# Patient Record
Sex: Male | Born: 1935
Health system: Southern US, Community
[De-identification: ages and names within clinical notes are randomized; demographics above are authoritative.]

## PROBLEM LIST (undated history)

## (undated) DIAGNOSIS — R001 Bradycardia, unspecified: Secondary | ICD-10-CM

## (undated) DIAGNOSIS — I499 Cardiac arrhythmia, unspecified: Secondary | ICD-10-CM

## (undated) DIAGNOSIS — I7789 Other specified disorders of arteries and arterioles: Secondary | ICD-10-CM

## (undated) DIAGNOSIS — I513 Intracardiac thrombosis, not elsewhere classified: Secondary | ICD-10-CM

## (undated) DIAGNOSIS — F419 Anxiety disorder, unspecified: Secondary | ICD-10-CM

## (undated) DIAGNOSIS — I4891 Unspecified atrial fibrillation: Secondary | ICD-10-CM

## (undated) DIAGNOSIS — I10 Essential (primary) hypertension: Secondary | ICD-10-CM

## (undated) DIAGNOSIS — I5020 Unspecified systolic (congestive) heart failure: Secondary | ICD-10-CM

## (undated) DIAGNOSIS — Z7901 Long term (current) use of anticoagulants: Secondary | ICD-10-CM

## (undated) DIAGNOSIS — K5790 Diverticulosis of intestine, part unspecified, without perforation or abscess without bleeding: Secondary | ICD-10-CM

## (undated) DIAGNOSIS — D696 Thrombocytopenia, unspecified: Secondary | ICD-10-CM

## (undated) DIAGNOSIS — C801 Malignant (primary) neoplasm, unspecified: Secondary | ICD-10-CM

## (undated) DIAGNOSIS — I5022 Chronic systolic (congestive) heart failure: Secondary | ICD-10-CM

## (undated) DIAGNOSIS — J189 Pneumonia, unspecified organism: Secondary | ICD-10-CM

## (undated) DIAGNOSIS — K649 Unspecified hemorrhoids: Secondary | ICD-10-CM

## (undated) DIAGNOSIS — I509 Heart failure, unspecified: Secondary | ICD-10-CM

## (undated) DIAGNOSIS — K219 Gastro-esophageal reflux disease without esophagitis: Secondary | ICD-10-CM

## (undated) DIAGNOSIS — M1990 Unspecified osteoarthritis, unspecified site: Secondary | ICD-10-CM

## (undated) DIAGNOSIS — J40 Bronchitis, not specified as acute or chronic: Secondary | ICD-10-CM

## (undated) DIAGNOSIS — I2699 Other pulmonary embolism without acute cor pulmonale: Secondary | ICD-10-CM

## (undated) HISTORY — DX: Other specified disorders of arteries and arterioles: I77.89

## (undated) HISTORY — DX: Heart failure, unspecified: I50.9

## (undated) HISTORY — PX: KNEE ARTHROSCOPY: SUR90

## (undated) HISTORY — DX: Intracardiac thrombosis, not elsewhere classified: I51.3

## (undated) HISTORY — DX: Essential (primary) hypertension: I10

## (undated) HISTORY — DX: Bradycardia, unspecified: R00.1

## (undated) HISTORY — DX: Long term (current) use of anticoagulants: Z79.01

## (undated) HISTORY — DX: Chronic systolic (congestive) heart failure: I50.22

## (undated) HISTORY — PX: PROSTATE SURGERY: SHX751

## (undated) HISTORY — DX: Thrombocytopenia, unspecified: D69.6

## (undated) HISTORY — DX: Pneumonia, unspecified organism: J18.9

## (undated) HISTORY — DX: Malignant (primary) neoplasm, unspecified: C80.1

## (undated) HISTORY — DX: Unspecified hemorrhoids: K64.9

## (undated) HISTORY — DX: Unspecified systolic (congestive) heart failure: I50.20

## (undated) HISTORY — DX: Diverticulosis of intestine, part unspecified, without perforation or abscess without bleeding: K57.90

---

## 2002-03-16 ENCOUNTER — Encounter: Payer: Self-pay | Admitting: General Surgery

## 2002-03-21 ENCOUNTER — Ambulatory Visit (HOSPITAL_COMMUNITY): Admission: RE | Admit: 2002-03-21 | Discharge: 2002-03-21 | Payer: Self-pay | Admitting: General Surgery

## 2004-09-26 ENCOUNTER — Ambulatory Visit: Payer: Self-pay | Admitting: Family Medicine

## 2004-11-25 ENCOUNTER — Ambulatory Visit: Payer: Self-pay | Admitting: Family Medicine

## 2004-12-02 ENCOUNTER — Ambulatory Visit: Payer: Self-pay | Admitting: Family Medicine

## 2004-12-20 ENCOUNTER — Ambulatory Visit: Payer: Self-pay | Admitting: Family Medicine

## 2005-01-29 ENCOUNTER — Ambulatory Visit: Payer: Self-pay | Admitting: Family Medicine

## 2005-02-12 ENCOUNTER — Ambulatory Visit: Payer: Self-pay | Admitting: Family Medicine

## 2005-03-06 ENCOUNTER — Ambulatory Visit: Payer: Self-pay | Admitting: Family Medicine

## 2005-04-24 ENCOUNTER — Emergency Department (HOSPITAL_COMMUNITY): Admission: EM | Admit: 2005-04-24 | Discharge: 2005-04-24 | Payer: Self-pay | Admitting: Emergency Medicine

## 2005-05-02 ENCOUNTER — Emergency Department (HOSPITAL_COMMUNITY): Admission: EM | Admit: 2005-05-02 | Discharge: 2005-05-02 | Payer: Self-pay | Admitting: Emergency Medicine

## 2005-05-07 ENCOUNTER — Ambulatory Visit: Payer: Self-pay | Admitting: Family Medicine

## 2005-05-30 ENCOUNTER — Ambulatory Visit (HOSPITAL_COMMUNITY): Admission: RE | Admit: 2005-05-30 | Discharge: 2005-05-30 | Payer: Self-pay | Admitting: Urology

## 2005-06-02 ENCOUNTER — Ambulatory Visit: Payer: Self-pay | Admitting: Family Medicine

## 2005-06-04 ENCOUNTER — Ambulatory Visit: Payer: Self-pay | Admitting: Cardiology

## 2005-06-18 ENCOUNTER — Ambulatory Visit: Payer: Self-pay | Admitting: Family Medicine

## 2005-07-07 ENCOUNTER — Ambulatory Visit: Payer: Self-pay | Admitting: Cardiology

## 2005-07-18 ENCOUNTER — Emergency Department (HOSPITAL_COMMUNITY): Admission: EM | Admit: 2005-07-18 | Discharge: 2005-07-18 | Payer: Self-pay | Admitting: Family Medicine

## 2005-08-21 ENCOUNTER — Ambulatory Visit: Payer: Self-pay | Admitting: Family Medicine

## 2005-09-30 ENCOUNTER — Ambulatory Visit: Payer: Self-pay | Admitting: Family Medicine

## 2005-12-09 ENCOUNTER — Ambulatory Visit: Payer: Self-pay | Admitting: Family Medicine

## 2006-01-22 ENCOUNTER — Ambulatory Visit: Payer: Self-pay | Admitting: Family Medicine

## 2006-05-21 ENCOUNTER — Ambulatory Visit: Payer: Self-pay | Admitting: Family Medicine

## 2006-07-03 ENCOUNTER — Ambulatory Visit: Payer: Self-pay | Admitting: Family Medicine

## 2006-09-22 ENCOUNTER — Ambulatory Visit: Payer: Self-pay | Admitting: Family Medicine

## 2008-11-29 ENCOUNTER — Ambulatory Visit: Payer: Self-pay | Admitting: Gastroenterology

## 2008-12-04 ENCOUNTER — Telehealth: Payer: Self-pay | Admitting: Gastroenterology

## 2008-12-07 ENCOUNTER — Ambulatory Visit: Payer: Self-pay | Admitting: Gastroenterology

## 2011-01-17 NOTE — Op Note (Signed)
[facility]  Patient:    Jimmy Myers, Jimmy Myers Visit Number: [id number] MRN: [id number]          Service Type: DSU Location: DAY Attending Physician:  Carson Myrtle Dictated by:   Sheppard Plumber Earlene Plater, M.D. Proc. Date: 03/21/02 Admit Date:  03/21/2002 Discharge Date: 03/21/2002                             Operative Report  PREOPERATIVE DIAGNOSIS:  Anal fissure.  POSTOPERATIVE DIAGNOSIS:  Anal fissure.  PROCEDURE:  Repair of anal fissure, proctoscopy.  SURGEON:  Timothy E. Earlene Plater, M.D.  ANESTHESIA:  General.  BRIEF CLINICAL NOTE:  The patient is 77 year old otherwise, healthy, stable. Denies any bowel problems and specifically denies constipation. He has had an intermittent fissure for years with only minimal relief with conservative management including diltiazem gel. After careful discussion, he wishes to proceed with this surgery.  The patient was evaluated by anesthesia, identified in the preop area, permit signed.  PROCEDURE IN DETAIL:  Taken to the operating room, placed supine. LMA anesthesia provided. He was placed in lithotomy. Perianal area inspected, prepped and draped in the usual fashion. A deep angry posterior anal fissure was present. Anal stenosis was moderate. The anus was prepped and draped in the usual fashion. Marcaine 0.25% with epinephrine was injected round about the anal orifice, massaged in well. A left posterior internal sphincterotomy accomplished percutaneously with a 15 blade. Care taken to avoid the external sphincter. The anus was gently dilated and accepted the operating anoscope. There was no other pathology noted. The fissure was debrided and cauterized. The scope was withdrawn. Proctoscopy accomplished to 22 cm, which was negative. He tolerated it well. Gelfoam gauze and dry sterile dressing applied. He was removed to the recovery room in good condition.  Written and verbal instructions including Percocet 5 mg,  #30 were given. He will be seen followed as an outpatient. Dictated by:   Sheppard Plumber Earlene Plater, M.D. Attending Physician:  Carson Myrtle DD:  03/21/02 TD:  03/24/02 Job: [id number]

## 2011-09-02 DIAGNOSIS — I509 Heart failure, unspecified: Secondary | ICD-10-CM

## 2011-09-02 DIAGNOSIS — J189 Pneumonia, unspecified organism: Secondary | ICD-10-CM

## 2011-09-02 HISTORY — DX: Pneumonia, unspecified organism: J18.9

## 2011-09-02 HISTORY — DX: Heart failure, unspecified: I50.9

## 2011-12-22 ENCOUNTER — Encounter: Payer: Self-pay | Admitting: Gastroenterology

## 2011-12-28 ENCOUNTER — Inpatient Hospital Stay (HOSPITAL_COMMUNITY)
Admission: EM | Admit: 2011-12-28 | Discharge: 2012-01-05 | DRG: 291 | Disposition: A | Discharge status: Home / Self Care | Payer: Medicare Other | Attending: Internal Medicine | Admitting: Internal Medicine

## 2011-12-28 ENCOUNTER — Encounter (HOSPITAL_COMMUNITY): Payer: Self-pay | Admitting: Neurology

## 2011-12-28 ENCOUNTER — Emergency Department (HOSPITAL_COMMUNITY): Payer: Medicare Other

## 2011-12-28 DIAGNOSIS — R1319 Other dysphagia: Secondary | ICD-10-CM

## 2011-12-28 DIAGNOSIS — J9600 Acute respiratory failure, unspecified whether with hypoxia or hypercapnia: Secondary | ICD-10-CM | POA: Diagnosis present

## 2011-12-28 DIAGNOSIS — I4891 Unspecified atrial fibrillation: Secondary | ICD-10-CM

## 2011-12-28 DIAGNOSIS — I5023 Acute on chronic systolic (congestive) heart failure: Principal | ICD-10-CM | POA: Diagnosis present

## 2011-12-28 DIAGNOSIS — I080 Rheumatic disorders of both mitral and aortic valves: Secondary | ICD-10-CM | POA: Diagnosis present

## 2011-12-28 DIAGNOSIS — I5022 Chronic systolic (congestive) heart failure: Secondary | ICD-10-CM | POA: Diagnosis present

## 2011-12-28 DIAGNOSIS — I10 Essential (primary) hypertension: Secondary | ICD-10-CM

## 2011-12-28 DIAGNOSIS — I519 Heart disease, unspecified: Secondary | ICD-10-CM

## 2011-12-28 DIAGNOSIS — E876 Hypokalemia: Secondary | ICD-10-CM | POA: Diagnosis present

## 2011-12-28 DIAGNOSIS — J189 Pneumonia, unspecified organism: Secondary | ICD-10-CM

## 2011-12-28 DIAGNOSIS — I428 Other cardiomyopathies: Secondary | ICD-10-CM | POA: Diagnosis present

## 2011-12-28 DIAGNOSIS — I509 Heart failure, unspecified: Secondary | ICD-10-CM

## 2011-12-28 DIAGNOSIS — J209 Acute bronchitis, unspecified: Secondary | ICD-10-CM

## 2011-12-28 DIAGNOSIS — D696 Thrombocytopenia, unspecified: Secondary | ICD-10-CM

## 2011-12-28 HISTORY — DX: Anxiety disorder, unspecified: F41.9

## 2011-12-28 HISTORY — DX: Chronic systolic (congestive) heart failure: I50.22

## 2011-12-28 HISTORY — DX: Unspecified atrial fibrillation: I48.91

## 2011-12-28 LAB — URINALYSIS, ROUTINE W REFLEX MICROSCOPIC
Ketones, ur: NEGATIVE mg/dL
Leukocytes, UA: NEGATIVE
Nitrite: NEGATIVE
Protein, ur: NEGATIVE mg/dL
pH: 5.5 (ref 5.0–8.0)

## 2011-12-28 LAB — BASIC METABOLIC PANEL
BUN: 20 mg/dL (ref 6–23)
Calcium: 8.7 mg/dL (ref 8.4–10.5)
Creatinine, Ser: 1.07 mg/dL (ref 0.50–1.35)
GFR calc Af Amer: 75 mL/min — ABNORMAL LOW (ref >90)
GFR calc non Af Amer: 65 mL/min — ABNORMAL LOW (ref >90)

## 2011-12-28 LAB — PHOSPHORUS: Phosphorus: 3.2 mg/dL (ref 2.3–4.6)

## 2011-12-28 LAB — CBC
MCH: 31.6 pg (ref 26.0–34.0)
MCHC: 32.8 g/dL (ref 30.0–36.0)
MCV: 96.2 fL (ref 78.0–100.0)
Platelets: 71 10*3/uL — ABNORMAL LOW (ref 150–400)
RDW: 15.8 % — ABNORMAL HIGH (ref 11.5–15.5)
WBC: 12.1 10*3/uL — ABNORMAL HIGH (ref 4.0–10.5)

## 2011-12-28 LAB — URINE MICROSCOPIC-ADD ON

## 2011-12-28 LAB — MAGNESIUM: Magnesium: 2 mg/dL (ref 1.5–2.5)

## 2011-12-28 LAB — PROTIME-INR: Prothrombin Time: 15.5 seconds — ABNORMAL HIGH (ref 11.6–15.2)

## 2011-12-28 LAB — EXPECTORATED SPUTUM ASSESSMENT W GRAM STAIN, RFLX TO RESP C

## 2011-12-28 LAB — CARDIAC PANEL(CRET KIN+CKTOT+MB+TROPI): Total CK: 66 U/L (ref 7–232)

## 2011-12-28 MED ORDER — WARFARIN - PHARMACIST DOSING INPATIENT
Freq: Every day | Status: DC
  Administered 2011-12-28 – 2012-01-04 (×3)

## 2011-12-28 MED ORDER — CLORAZEPATE DIPOTASSIUM 3.75 MG PO TABS
3.7500 mg | ORAL_TABLET | Freq: Two times a day (BID) | ORAL | Status: DC | PRN
  Administered 2012-01-03: 3.75 mg via ORAL
  Filled 2011-12-28: qty 1

## 2011-12-28 MED ORDER — ASPIRIN EC 325 MG PO TBEC
325.0000 mg | DELAYED_RELEASE_TABLET | Freq: Every day | ORAL | Status: DC
  Administered 2011-12-28: 325 mg via ORAL
  Filled 2011-12-28 (×2): qty 1

## 2011-12-28 MED ORDER — SODIUM CHLORIDE 0.9 % IV SOLN: INTRAVENOUS | Status: DC

## 2011-12-28 MED ORDER — DEXTROSE 5 % IV SOLN
1.0000 g | INTRAVENOUS | Status: AC
  Administered 2011-12-29 – 2012-01-03 (×6): 1 g via INTRAVENOUS
  Filled 2011-12-28 (×6): qty 10

## 2011-12-28 MED ORDER — FUROSEMIDE 10 MG/ML IJ SOLN
60.0000 mg | Freq: Once | INTRAMUSCULAR | Status: AC
  Administered 2011-12-28: 60 mg via INTRAVENOUS
  Filled 2011-12-28: qty 6

## 2011-12-28 MED ORDER — LEVALBUTEROL HCL 0.63 MG/3ML IN NEBU
0.6300 mg | INHALATION_SOLUTION | Freq: Four times a day (QID) | RESPIRATORY_TRACT | Status: DC | PRN
  Filled 2011-12-28: qty 3

## 2011-12-28 MED ORDER — GUAIFENESIN ER 600 MG PO TB12
1200.0000 mg | ORAL_TABLET | Freq: Two times a day (BID) | ORAL | Status: DC
  Administered 2011-12-28 – 2012-01-05 (×16): 1200 mg via ORAL
  Filled 2011-12-28 (×17): qty 2

## 2011-12-28 MED ORDER — NITROGLYCERIN IN D5W 200-5 MCG/ML-% IV SOLN
2.0000 ug/min | Freq: Once | INTRAVENOUS | Status: AC
  Administered 2011-12-28: 5 ug/min via INTRAVENOUS
  Filled 2011-12-28: qty 250

## 2011-12-28 MED ORDER — POTASSIUM CHLORIDE CRYS ER 20 MEQ PO TBCR
20.0000 meq | EXTENDED_RELEASE_TABLET | Freq: Two times a day (BID) | ORAL | Status: DC
  Administered 2011-12-28 – 2011-12-31 (×6): 20 meq via ORAL
  Filled 2011-12-28 (×7): qty 1

## 2011-12-28 MED ORDER — ONDANSETRON HCL 4 MG PO TABS: 4.0000 mg | ORAL_TABLET | Freq: Four times a day (QID) | ORAL | Status: DC | PRN

## 2011-12-28 MED ORDER — PANTOPRAZOLE SODIUM 40 MG PO TBEC
40.0000 mg | DELAYED_RELEASE_TABLET | Freq: Every day | ORAL | Status: DC
  Administered 2011-12-28 – 2012-01-05 (×9): 40 mg via ORAL
  Filled 2011-12-28 (×8): qty 1

## 2011-12-28 MED ORDER — ZOLPIDEM TARTRATE 5 MG PO TABS
5.0000 mg | ORAL_TABLET | Freq: Every evening | ORAL | Status: DC | PRN
  Administered 2011-12-29 – 2012-01-05 (×4): 5 mg via ORAL
  Filled 2011-12-28 (×4): qty 1

## 2011-12-28 MED ORDER — SODIUM CHLORIDE 0.9 % IJ SOLN
3.0000 mL | INTRAMUSCULAR | Status: DC | PRN
  Administered 2012-01-02: 3 mL via INTRAVENOUS

## 2011-12-28 MED ORDER — DOCUSATE SODIUM 100 MG PO CAPS
100.0000 mg | ORAL_CAPSULE | Freq: Two times a day (BID) | ORAL | Status: DC
  Administered 2011-12-28 – 2012-01-05 (×14): 100 mg via ORAL
  Filled 2011-12-28 (×17): qty 1

## 2011-12-28 MED ORDER — FUROSEMIDE 10 MG/ML IJ SOLN
80.0000 mg | Freq: Once | INTRAMUSCULAR | Status: AC
  Administered 2011-12-28: 80 mg via INTRAVENOUS
  Filled 2011-12-28: qty 8

## 2011-12-28 MED ORDER — NITROGLYCERIN 0.4 MG SL SUBL: 0.4000 mg | SUBLINGUAL_TABLET | SUBLINGUAL | Status: DC | PRN

## 2011-12-28 MED ORDER — FUROSEMIDE 10 MG/ML IJ SOLN
40.0000 mg | Freq: Two times a day (BID) | INTRAMUSCULAR | Status: DC
  Administered 2011-12-29: 40 mg via INTRAVENOUS
  Filled 2011-12-28 (×3): qty 4

## 2011-12-28 MED ORDER — MORPHINE SULFATE 2 MG/ML IJ SOLN: 1.0000 mg | INTRAMUSCULAR | Status: DC | PRN

## 2011-12-28 MED ORDER — AZITHROMYCIN 250 MG PO TABS
500.0000 mg | ORAL_TABLET | Freq: Once | ORAL | Status: AC
  Administered 2011-12-28: 500 mg via ORAL
  Filled 2011-12-28: qty 2

## 2011-12-28 MED ORDER — WARFARIN SODIUM 7.5 MG PO TABS
7.5000 mg | ORAL_TABLET | Freq: Once | ORAL | Status: DC
  Filled 2011-12-28: qty 1

## 2011-12-28 MED ORDER — DEXTROSE 5 % IV SOLN
500.0000 mg | INTRAVENOUS | Status: AC
  Administered 2011-12-29 – 2011-12-30 (×2): 500 mg via INTRAVENOUS
  Filled 2011-12-28 (×2): qty 500

## 2011-12-28 MED ORDER — SODIUM CHLORIDE 0.9 % IV SOLN
250.0000 mL | INTRAVENOUS | Status: DC | PRN
  Administered 2011-12-31: 23:00:00 via INTRAVENOUS

## 2011-12-28 MED ORDER — SODIUM CHLORIDE 0.9 % IJ SOLN
3.0000 mL | Freq: Two times a day (BID) | INTRAMUSCULAR | Status: DC
  Administered 2011-12-29 – 2012-01-05 (×12): 3 mL via INTRAVENOUS

## 2011-12-28 MED ORDER — DILTIAZEM HCL 100 MG IV SOLR
5.0000 mg/h | INTRAVENOUS | Status: DC
  Administered 2011-12-28: 5 mg/h via INTRAVENOUS
  Administered 2011-12-29 – 2012-01-01 (×10): 15 mg/h via INTRAVENOUS
  Filled 2011-12-28 (×3): qty 100

## 2011-12-28 MED ORDER — ONDANSETRON HCL 4 MG/2ML IJ SOLN: 4.0000 mg | Freq: Four times a day (QID) | INTRAMUSCULAR | Status: DC | PRN

## 2011-12-28 MED ORDER — SODIUM CHLORIDE 0.9 % IJ SOLN
3.0000 mL | Freq: Two times a day (BID) | INTRAMUSCULAR | Status: DC
  Administered 2011-12-28 (×2): 3 mL via INTRAVENOUS

## 2011-12-28 MED ORDER — WARFARIN SODIUM 2.5 MG PO TABS
2.5000 mg | ORAL_TABLET | Freq: Once | ORAL | Status: AC
  Administered 2011-12-28: 2.5 mg via ORAL
  Filled 2011-12-28: qty 1

## 2011-12-28 MED ORDER — HEPARIN (PORCINE) IN NACL 100-0.45 UNIT/ML-% IJ SOLN
1400.0000 [IU]/h | INTRAMUSCULAR | Status: DC
  Administered 2011-12-28: 1300 [IU]/h via INTRAVENOUS
  Administered 2011-12-29 – 2011-12-31 (×4): 1400 [IU]/h via INTRAVENOUS
  Filled 2011-12-28 (×8): qty 250

## 2011-12-28 MED ORDER — POTASSIUM CHLORIDE 20 MEQ/15ML (10%) PO LIQD
40.0000 meq | Freq: Once | ORAL | Status: AC
  Administered 2011-12-28: 40 meq via ORAL
  Filled 2011-12-28: qty 30

## 2011-12-28 MED ORDER — HEPARIN BOLUS VIA INFUSION
2000.0000 [IU] | Freq: Once | INTRAVENOUS | Status: AC
  Administered 2011-12-28: 2000 [IU] via INTRAVENOUS

## 2011-12-28 MED ORDER — ACETAMINOPHEN 325 MG PO TABS: 650.0000 mg | ORAL_TABLET | Freq: Four times a day (QID) | ORAL | Status: DC | PRN

## 2011-12-28 MED ORDER — ACETAMINOPHEN 650 MG RE SUPP: 650.0000 mg | Freq: Four times a day (QID) | RECTAL | Status: DC | PRN

## 2011-12-28 MED ORDER — DILTIAZEM HCL 25 MG/5ML IV SOLN
10.0000 mg | Freq: Once | INTRAVENOUS | Status: AC
  Administered 2011-12-28: 10 mg via INTRAVENOUS
  Filled 2011-12-28: qty 5

## 2011-12-28 MED ORDER — DEXTROSE 5 % IV SOLN
1.0000 g | Freq: Once | INTRAVENOUS | Status: AC
  Administered 2011-12-28: 1 g via INTRAVENOUS
  Filled 2011-12-28: qty 10

## 2011-12-28 NOTE — Progress Notes (Signed)
ANTICOAGULATION CONSULT NOTE - Initial Consult  Pharmacy Consult for Heparin Indication: atrial fibrillation  Allergies  Allergen Reactions  . Codeine Other (See Comments)    nervousness  . Simvastatin Other (See Comments)    unknown  . Vicodin (Hydrocodone-Acetaminophen) Other (See Comments)    nervousness    Patient Measurements: Height: 5\' 10"  (177.8 cm) Weight: 220 lb (99.791 kg) IBW/kg (Calculated) : 73  Heparin Dosing Weight: 94 kg  Vital Signs: Temp: 99.4 F (37.4 C) (04/28 1216) Temp src: Oral (04/28 1216) BP: 105/74 mmHg (04/28 1634) Pulse Rate: 117  (04/28 1634)  Labs:  Basename 12/28/11 1310  HGB 12.5*  HCT 38.1*  PLT 71*  APTT --  LABPROT 15.5*  INR 1.20  HEPARINUNFRC --  CREATININE 1.07  CKTOTAL --  CKMB --  TROPONINI --   Estimated Creatinine Clearance: 68.4 ml/min (by C-G formula based on Cr of 1.07).  Medical History: Past Medical History  Diagnosis Date  . CHF (congestive heart failure)   . A-fib   . Anxiety     Assessment: 73 YOM with history of CHF and Afib presented with SOB, with persistent afib with RVR. Pt. Is on coumadin PTA (takes 5mg  daily per Med Rec), INR (1.2) subtherapeutic on admission. Pharmacy is consulted to start heparin until INR is therapeutic. hgb 12.5, plt is low, 71K.    Goal of Therapy:  Heparin level 0.3-0.7 units/ml   Plan:  - heparin bolus 2000 units x 1 (smaller bolus d/t low plts) - heparin infusion 1300 units/hr - f/u heparin level 8 hours after infusion start - monitor daily cbc and heparin level and sx of bleeding - f/u resuming coumadin  Riki Rusk 12/28/2011,4:52 PM

## 2011-12-28 NOTE — Progress Notes (Addendum)
Pharmacy anticoagulation addendum.  Asked to add warfarin to heparin dosing for atrial fibrillation.  INR 1.2 today.  Per pt, diagnosed 2 days ago at [place] with afib and started taking warfarin 5 mg po qday at this time.  Goal INR 2-3  Plan - Give additional Warfarin 2.5 mg po x1 now  - Daily INR  Jimmy Myers L. Illene Bolus, PharmD, BCPS Clinical Pharmacist Pager: [phone] 12/28/2011 7:57 PM

## 2011-12-28 NOTE — ED Notes (Signed)
ECHO unable to complete due to high HR.

## 2011-12-28 NOTE — ED Notes (Signed)
EDP in to see patient.

## 2011-12-28 NOTE — Consult Note (Signed)
[facility]  Primary Cardiologist: None  CC:  New onset heart failure and atrial fibrillation  History of Present Illness: Jimmy Myers is a 78 y.o. male with a past medical history significant for anxiety who presents for evaluation of new onset heart failure and atrial fibrillation.  He reports worsening shortness of breath over the past several weeks.  This has been associated with bilateral lower extremity edema and orthopnea.  He denies PND or syncope.  He also notes that his heart rate has been elevated to the 130s for >2 weeks.  He was seen at the [place] 2 days ago and was diagnosed with AF.  He was started on cardizem and coumadin and also received lasix.  Tonight, he became more SOB and came to the ED.  In the ED his CXR demonstrated pulmonary edema and labs were notable for a BNP of >3000.  He was given IV lasix and started on IV dilt and heparin.  Cardiology is consulted for evaluation.    PMH: 1. Per HPI  SOCIAL HISTORY: The patient denies tobacco, alcohol or drug use.   FAMILY HISTORY: There is no family history of atrial fibrillation.    REVIEW OF SYSTEMS: All systems reviewed and are negative except as mentioned above in the HPI.    MEDICATIONS: No current facility-administered medications on file prior to encounter.   Current Outpatient Prescriptions on File Prior to Encounter  Medication Sig Dispense Refill  . diltiazem (CARDIZEM) 120 MG tablet Take 120 mg by mouth daily.      . furosemide (LASIX) 20 MG tablet Take 20 mg by mouth 2 (two) times daily.      . potassium chloride (K-DUR) 10 MEQ tablet Take 10 mEq by mouth daily.      Marland Kitchen warfarin (COUMADIN) 5 MG tablet Take 5 mg by mouth daily.         ALLERGY: Allergies  Allergen Reactions  . Codeine Other (See Comments)    nervousness  . Simvastatin Other (See Comments)    unknown  . Vicodin (Hydrocodone-Acetaminophen) Other (See Comments)    nervousness     PHYSICAL EXAMINATION: Filed Vitals:   12/28/11 1825  BP:  122/71  Pulse: 119  Temp:   Resp: 16     GENERAL: well developed, well nourished, no acute distress EYES: PERRL, EOMI ENT: Oropharynx is clear.  Dentition is within normal limits NECK:  There is 7cm JVD above the clavicle.  No masses HEART: Tachy, Irreg Irreg with no murmurs, rubs, or gallops LUNGS: Bibasilar crackles.   ABDOMEN:  +BS, soft, NTND EXTREMITIES:  1+ LEE B NEUROLOGIC: AOx3, no focal deficits SKIN: No rashes PSYCH:  Normal judgment and insight, mood is appropriate.  EKG: 12/28/11 - 1211 - AF with rate of 123 with nonspecific T wave changes.    Results for orders placed during the hospital encounter of 12/28/11 (from the past 24 hour(s))  CBC     Status: Abnormal   Collection Time   12/28/11  1:10 PM      Component Value Range   WBC 12.1 (*) 4.0 - 10.5 (K/uL)   RBC 3.96 (*) 4.22 - 5.81 (MIL/uL)   Hemoglobin 12.5 (*) 13.0 - 17.0 (g/dL)   HCT 16.1 (*) 09.6 - 52.0 (%)   MCV 96.2  78.0 - 100.0 (fL)   MCH 31.6  26.0 - 34.0 (pg)   MCHC 32.8  30.0 - 36.0 (g/dL)   RDW 04.5 (*) 40.9 - 15.5 (%)   Platelets  71 (*) 150 - 400 (K/uL)  BASIC METABOLIC PANEL     Status: Abnormal   Collection Time   12/28/11  1:10 PM      Component Value Range   Sodium 138  135 - 145 (mEq/L)   Potassium 4.3  3.5 - 5.1 (mEq/L)   Chloride 104  96 - 112 (mEq/L)   CO2 24  19 - 32 (mEq/L)   Glucose, Bld 120 (*) 70 - 99 (mg/dL)   BUN 20  6 - 23 (mg/dL)   Creatinine, Ser 1.19  0.50 - 1.35 (mg/dL)   Calcium 8.7  8.4 - 14.7 (mg/dL)   GFR calc non Af Amer 65 (*) >90 (mL/min)   GFR calc Af Amer 75 (*) >90 (mL/min)  PROTIME-INR     Status: Abnormal   Collection Time   12/28/11  1:10 PM      Component Value Range   Prothrombin Time 15.5 (*) 11.6 - 15.2 (seconds)   INR 1.20  0.00 - 1.49   PRO B NATRIURETIC PEPTIDE     Status: Abnormal   Collection Time   12/28/11  1:10 PM      Component Value Range   Pro B Natriuretic peptide (BNP) 3452.0 (*) 0 - 450 (pg/mL)  MAGNESIUM     Status: Normal    Collection Time   12/28/11  1:10 PM      Component Value Range   Magnesium 2.0  1.5 - 2.5 (mg/dL)  PHOSPHORUS     Status: Normal   Collection Time   12/28/11  1:10 PM      Component Value Range   Phosphorus 3.2  2.3 - 4.6 (mg/dL)  URINALYSIS, ROUTINE W REFLEX MICROSCOPIC     Status: Abnormal   Collection Time   12/28/11  4:58 PM      Component Value Range   Color, Urine YELLOW  YELLOW    APPearance CLEAR  CLEAR    Specific Gravity, Urine 1.012  1.005 - 1.030    pH 5.5  5.0 - 8.0    Glucose, UA NEGATIVE  NEGATIVE (mg/dL)   Hgb urine dipstick SMALL (*) NEGATIVE    Bilirubin Urine NEGATIVE  NEGATIVE    Ketones, ur NEGATIVE  NEGATIVE (mg/dL)   Protein, ur NEGATIVE  NEGATIVE (mg/dL)   Urobilinogen, UA 1.0  0.0 - 1.0 (mg/dL)   Nitrite NEGATIVE  NEGATIVE    Leukocytes, UA NEGATIVE  NEGATIVE   URINE MICROSCOPIC-ADD ON     Status: Normal   Collection Time   12/28/11  4:58 PM      Component Value Range   WBC, UA 0-2  <3 (WBC/hpf)   RBC / HPF 0-2  <3 (RBC/hpf)  CULTURE, EXPECTORATED SPUTUM-ASSESSMENT     Status: Normal   Collection Time   12/28/11  6:07 PM      Component Value Range   Specimen Description SPUTUM     Special Requests NONE     Sputum evaluation       Value: THIS SPECIMEN IS ACCEPTABLE. RESPIRATORY CULTURE REPORT TO FOLLOW.   Report Status 12/28/2011 FINAL     Dg Chest Portable 1 View  12/28/2011  *RADIOLOGY REPORT*  Clinical Data: Atrial fibrillation.  History of congestive heart failure.  PORTABLE CHEST - 1 VIEW  Comparison: 07/18/2005.  Findings: 1310 hours.  There is mild patient rotation to the right. The heart is enlarged.  There is vascular congestion with new perihilar and lower lobe air space opacities most consistent with edema.  There are probable small bilateral pleural effusions.  No pneumothorax is evident.  IMPRESSION: Bilateral air space opacities and probable pleural effusions, most consistent with congestive heart failure.  Original Report Authenticated By:  Gerrianne Scale, M.D.    ASSESSMENT AND PLAN: 13 YOWM with no prior cardiac history who presents with acute heart failure exacerbation in the setting of atrial fibrillation with RVR.   1: Acute heart failure exacerbation - the etiology of his HF is unclear and this could either be diastolic or systolic heart failure.  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  Other etiologies includes ischemia, myocarditis, etc.  Recommend continued IV diuresis with lasix 40mg  IV BID.  At this time, I would not start beta blocker since he is decompensated.  I would hold on ACEI as his BP is low.  Monitor daily weight and renal function.  Check TTE tomorrow.  He will need an ischemia evaluation prior to discharge (stress vs. Cath).  Cycle cardiac enzymes.    2: AF RVR - he is currently on dilt gtt at 15mg  / hr and his HR is in the low 100s.  His tachycardia is being exacerbated by his HF.  Continue IV dilt and IV heparin.  We can evaluate long term management after we have more information.    Bernestine Amass. Mayford Knife, MD Level 5 Consult

## 2011-12-28 NOTE — H&P (Signed)
PCP:   Josue Hector, MD, MD   Chief Complaint:  Shortness of breath for a week or so, productive cough for 2 days.  HPI: Mr Jimmy Myers comes in with worsening shortness of breath, building up over the last month but more severe in the last week or so. He has had cough productive of sputum(which is not clear per his account) in the last 2 days. He was seen at the [place] 2 days and was started on cardizem, coumadin for newly diagnosed afib. He also received lasix. He was supposed to follow at the [facility] tomorrow but could not wait till then, hence presentation in the ED today. He says he was getting increasingly short winded in the last month, and would stop to get relief. He denies gaining weight, but has noticed swelling of the legs, more on the left side. He denies orthopnea or PND. He has also been feeling unusually warm, particularly today. He denies chest pain. No diarrhea, vomiting or dysuria. IN ED his CXR suggested congestion, BNP was 4000, wbc 12000, temp 99.58F. He has been started on cardizem, lasix/ceftriaxone/zithromax/NTG, and referred for admission. He feels somewhat better.  Review of Systems:  unremar Past Medical History: Past Medical History  Diagnosis Date  . CHF (congestive heart failure)   . A-fib   . Anxiety    History reviewed. No pertinent past surgical history.  Medications: Prior to Admission medications   Medication Sig Start Date End Date Taking? Authorizing Provider  clorazepate (TRANXENE) 3.75 MG tablet Take 3.75 mg by mouth daily as needed. For anxiety   Yes Historical Provider, MD  diltiazem (CARDIZEM) 120 MG tablet Take 120 mg by mouth daily.   Yes Historical Provider, MD  furosemide (LASIX) 20 MG tablet Take 20 mg by mouth 2 (two) times daily.   Yes Historical Provider, MD  potassium chloride (K-DUR) 10 MEQ tablet Take 10 mEq by mouth daily.   Yes Historical Provider, MD  warfarin (COUMADIN) 5 MG tablet Take 5 mg by mouth daily.   Yes Historical Provider, MD     Allergies:   Allergies  Allergen Reactions  . Codeine Other (See Comments)    nervousness  . Simvastatin Other (See Comments)    unknown  . Vicodin (Hydrocodone-Acetaminophen) Other (See Comments)    nervousness    Social History:  reports that he has never smoked. He does not have any smokeless tobacco history on file. He reports that he does not drink alcohol or use illicit drugs. Lives with his wife.  Family History: chf in parents.  Physical Exam: Filed Vitals:   12/28/11 1515 12/28/11 1530 12/28/11 1615 12/28/11 1634  BP: 113/70 116/82 105/74 105/74  Pulse: 128 125 125 117  Temp:      TempSrc:      Resp: 27 24 28 14   Height:    5\' 10"  (1.778 m)  Weight:    99.791 kg (220 lb)  SpO2: 94% 94% 95% 94%   Reclined in bed, in mild respiratory distress, coughing occasionally, is sweaty. PERRLA. No JVD. No carotid bruits. Equal air entry both lungs. Scattered rales/rhonchi. No wheezing. S1S2 heard, No murmurs. Irregular tachycardia, ventricular arte around 130. Abdomen- soft, non tender. +BS. CNS- grossly Intact. Extremities- some pedal edema, more on the left side.   Labs on Admission:   [facility] 12/28/11 1310  NA 138  K 4.3  CL 104  CO2 24  GLUCOSE 120*  BUN 20  CREATININE 1.07  CALCIUM 8.7  MG --  PHOS --  No results found for this basename: AST:2,ALT:2,ALKPHOS:2,BILITOT:2,PROT:2,ALBUMIN:2 in the last 72 hours No results found for this basename: LIPASE:2,AMYLASE:2 in the last 72 hours  Basename 12/28/11 1310  WBC 12.1*  NEUTROABS --  HGB 12.5*  HCT 38.1*  MCV 96.2  PLT 71*   No results found for this basename: CKTOTAL:3,CKMB:3,CKMBINDEX:3,TROPONINI:3 in the last 72 hours No results found for this basename: TSH,T4TOTAL,FREET3,T3FREE,THYROIDAB in the last 72 hours No results found for this basename: VITAMINB12:2,FOLATE:2,FERRITIN:2,TIBC:2,IRON:2,RETICCTPCT:2 in the last 72 hours  Radiological Exams on Admission: Dg Chest Portable 1 View  12/28/2011   *RADIOLOGY REPORT*  Clinical Data: Atrial fibrillation.  History of congestive heart failure.  PORTABLE CHEST - 1 VIEW  Comparison: 07/18/2005.  Findings: 1310 hours.  There is mild patient rotation to the right. The heart is enlarged.  There is vascular congestion with new perihilar and lower lobe air space opacities most consistent with edema.  There are probable small bilateral pleural effusions.  No pneumothorax is evident.  IMPRESSION: Bilateral air space opacities and probable pleural effusions, most consistent with congestive heart failure.  Original Report Authenticated By: Gerrianne Scale, M.D.    Assessment 78 year old male with acute exacerbation of CHF(EF unknown), in setting of AFIB with rvr, newly diagnosed. He likely has tachycardia induced element of cardiomyopathy over the last month. No recent ischemia work up. He may have superimposed PNA.  Plan .CHF, acute- admit SDU. Serial Cardiac enzymes/tsh/lipids panel/2decho, daily weight/I/O. Lasix/asa/kcl, hopefully acei once ef known. Dr Mayford Knife will graciously see patient in cosult later today. ? Ischemia work up at some point. .Community acquired pneumonia- septic work up. Ceftriaxone/zithromax. .New onset a-fib with rvr- amdit sdu for cardizem drip. Heparin/coumadin- target inr 2-3. Marland KitchenHTN (hypertension), benign- will be on cardizem drip. Gi prophylaxis.  Condition critical, time spent for critical care 45 minutes. I discussed plan of care with his wife and son at the bed side.  [name] [phone] 12/28/2011, 5:03 PM

## 2011-12-28 NOTE — ED Provider Notes (Signed)
History     CSN: [id number]  Arrival date & time 12/28/11  1205   First MD Initiated Contact with Patient 12/28/11 1214      Chief Complaint  Patient presents with  . Atrial Fibrillation    (Consider location/radiation/quality/duration/timing/severity/associated sxs/prior treatment) Patient is a 78 y.o. male presenting with shortness of breath.  Shortness of Breath  The current episode started 5 to 7 days ago. The problem occurs continuously. The problem has been gradually worsening. The problem is moderate. The symptoms are relieved by nothing. Associated symptoms include cough, shortness of breath and wheezing. Pertinent negatives include no chest pain, no chest pressure, no fever, no sore throat and no stridor.   Symptoms associated with the patient stating that his had a rapid heart rate has been present for about a week. Also has had shortness of breath seen at the [place] twice this past week with new diagnosis of atrial fib started on diltiazem and Coumadin and also chest x-ray showing some mild edema started on Lasix. Patient was not admitted to the [place]. EMS was called out to the house for shortness of breath that brought him in on 3 L of oxygen question sats were in the upper 90s off oxygen patient's sats remained above 92% on room air. Patient states that he or his breathing feels a little bit better. Past Medical History  Diagnosis Date  . CHF (congestive heart failure)   . A-fib   . Anxiety     History reviewed. No pertinent past surgical history.  No family history on file.  History  Substance Use Topics  . Smoking status: Never Smoker   . Smokeless tobacco: Not on file  . Alcohol Use: No      Review of Systems  Constitutional: Negative for fever, chills and diaphoresis.  HENT: Positive for congestion. Negative for sore throat.   Eyes: Negative for visual disturbance.  Respiratory: Positive for cough, shortness of breath and wheezing. Negative for chest tightness  and stridor.   Cardiovascular: Positive for palpitations and leg swelling. Negative for chest pain.  Gastrointestinal: Negative for nausea, vomiting, abdominal pain and diarrhea.  Genitourinary: Positive for dysuria.  Musculoskeletal: Negative for back pain.  Skin: Negative for rash.  Neurological: Negative for light-headedness and headaches.    Allergies  Codeine; Simvastatin; and Vicodin  Home Medications   Current Outpatient Rx  Name Route Sig Dispense Refill  . CLORAZEPATE DIPOTASSIUM 3.75 MG PO TABS Oral Take 3.75 mg by mouth daily as needed. For anxiety    . DILTIAZEM HCL 120 MG PO TABS Oral Take 120 mg by mouth daily.    . FUROSEMIDE 20 MG PO TABS Oral Take 20 mg by mouth 2 (two) times daily.    Marland Kitchen POTASSIUM CHLORIDE ER 10 MEQ PO TBCR Oral Take 10 mEq by mouth daily.    . WARFARIN SODIUM 5 MG PO TABS Oral Take 5 mg by mouth daily.      BP 116/82  Pulse 125  Temp(Src) 99.4 F (37.4 C) (Oral)  Resp 24  SpO2 94%  Physical Exam  Nursing note and vitals reviewed. Constitutional: He is oriented to person, place, and time. He appears well-developed and well-nourished. No distress.  HENT:  Head: Normocephalic and atraumatic.  Mouth/Throat: Oropharynx is clear and moist.  Eyes: Conjunctivae and EOM are normal. Pupils are equal, round, and reactive to light.  Neck: Normal range of motion. Neck supple.  Cardiovascular: Normal rate, regular rhythm and normal heart sounds.   No  murmur heard. Pulmonary/Chest: Effort normal. No respiratory distress. He has wheezes. He has rales. He exhibits no tenderness.  Abdominal: Soft. Bowel sounds are normal. There is no tenderness.  Musculoskeletal: Normal range of motion. He exhibits no edema.  Neurological: He is alert and oriented to person, place, and time. No cranial nerve deficit. He exhibits normal muscle tone. Coordination normal.  Skin: Skin is warm. No rash noted. He is not diaphoretic.    ED Course  Procedures (including  critical care time)  Labs Reviewed  CBC - Abnormal; Notable for the following:    WBC 12.1 (*)    RBC 3.96 (*)    Hemoglobin 12.5 (*)    HCT 38.1 (*)    RDW 15.8 (*)    Platelets 71 (*)    All other components within normal limits  BASIC METABOLIC PANEL - Abnormal; Notable for the following:    Glucose, Bld 120 (*)    GFR calc non Af Amer 65 (*)    GFR calc Af Amer 75 (*)    All other components within normal limits  PROTIME-INR - Abnormal; Notable for the following:    Prothrombin Time 15.5 (*)    All other components within normal limits  PRO B NATRIURETIC PEPTIDE - Abnormal; Notable for the following:    Pro B Natriuretic peptide (BNP) 3452.0 (*)    All other components within normal limits   Dg Chest Portable 1 View  12/28/2011  *RADIOLOGY REPORT*  Clinical Data: Atrial fibrillation.  History of congestive heart failure.  PORTABLE CHEST - 1 VIEW  Comparison: 07/18/2005.  Findings: 1310 hours.  There is mild patient rotation to the right. The heart is enlarged.  There is vascular congestion with new perihilar and lower lobe air space opacities most consistent with edema.  There are probable small bilateral pleural effusions.  No pneumothorax is evident.  IMPRESSION: Bilateral air space opacities and probable pleural effusions, most consistent with congestive heart failure.  Original Report Authenticated By: Gerrianne Scale, M.D.     Date: 12/28/2011  Rate: 123  Rhythm: atrial fibrillation  QRS Axis: normal  Intervals: normal  ST/T Wave abnormalities: nonspecific T wave changes  Conduction Disutrbances:none  Narrative Interpretation:   Old EKG Reviewed: changes noted Today's EKG with occasional PVC in comparison to EKG from 07/18/2005 atrophic fibrillation is new.   1. CHF (congestive heart failure)   2. Atrial fibrillation with rapid ventricular response     CRITICAL CARE Performed by: Shelda Jakes.   Total critical care time: 30  Critical care time was  exclusive of separately billable procedures and treating other patients.  Critical care was necessary to treat or prevent imminent or life-threatening deterioration.  Critical care was time spent personally by me on the following activities: development of treatment plan with patient and/or surrogate as well as nursing, discussions with consultants, evaluation of patient's response to treatment, examination of patient, obtaining history from patient or surrogate, ordering and performing treatments and interventions, ordering and review of laboratory studies, ordering and review of radiographic studies, pulse oximetry and re-evaluation of patient's condition.   MDM  Patient's findings consistent with persistent atrial fibrillation with rapid ventricular rate below 150 though. And chest x-ray consistent with congestive heart failure probably due to the sustained atrial fibrillation. By history sounds as if this all started approximately one week ago. Patient is normally followed at the [place] he was seen by them twice this past week they determined that he was in atrial fibrillation  started him on diltiazem and then noted that he had a little bit of fluid on his lungs and he was started on Lasix. Her planning to follow him up of again this upcoming week. Patient's shortness of breath got worse predominantly since last evening associated with a cough. Patient has had a history of bronchospasm in the past but not recently. Patient called EMS for the shortness of breath they brought him here gave him 125 mg Solu-Medrol in the way in. His heart rate by EMS was around 136. Patient was started on Cardizem Lasix and Coumadin 2 days ago patient denied any chest pain. As stated above workup consistent with atrial fib with congestive heart patient was given 10 mg of IV diltiazem and bring in heart rate down into the low 100s without any problem with blood pressure. Chest x-ray showed CHF patient was given 80 mg of Lasix IV  as well as started on nitroglycerin drip to be titrated again blood pressure tolerated this fine.  [facility] contacted for admission as an unassigned patient patient will be admitted to telemetry to continue his diuresis and monitoring of his atrial fib. Clinically I was not concerned about a pneumonia even though the patient has had increased cough with is due more to the pulmonary edema however at their request I gave a dose of Rocephin 1 g IV and 500 mg Zithromax by mouth in case this is a community-acquired pneumonia patient has not had a recent hospital admission.        Shelda Jakes, MD 12/28/11 [phone]

## 2011-12-28 NOTE — ED Notes (Signed)
Per ems- EMS called out SOB and fast HR, pt dx Friday with CHF and AFIB, pt reported started taking Cardizem, Lasix, coumadin 2 days ago. Denying any cp. Skin warm and dry. HR 100-136 for EMS. EKG showing AFIB. A & O x 4. Pt given 2 atrovent for SOB, 125 solumedrol. IV 20 gauge L. Hand. Pt 96% 2 L, 119/71, RR 20.

## 2011-12-29 DIAGNOSIS — I4891 Unspecified atrial fibrillation: Secondary | ICD-10-CM | POA: Diagnosis present

## 2011-12-29 DIAGNOSIS — I059 Rheumatic mitral valve disease, unspecified: Secondary | ICD-10-CM

## 2011-12-29 DIAGNOSIS — I509 Heart failure, unspecified: Secondary | ICD-10-CM

## 2011-12-29 DIAGNOSIS — J209 Acute bronchitis, unspecified: Secondary | ICD-10-CM | POA: Diagnosis present

## 2011-12-29 LAB — PROTIME-INR: INR: 1.34 (ref 0.00–1.49)

## 2011-12-29 LAB — COMPREHENSIVE METABOLIC PANEL
ALT: 34 U/L (ref 0–53)
AST: 23 U/L (ref 0–37)
CO2: 26 mEq/L (ref 19–32)
Chloride: 103 mEq/L (ref 96–112)
GFR calc non Af Amer: 54 mL/min — ABNORMAL LOW (ref >90)
Potassium: 4.1 mEq/L (ref 3.5–5.1)
Sodium: 138 mEq/L (ref 135–145)
Total Bilirubin: 1 mg/dL (ref 0.3–1.2)

## 2011-12-29 LAB — LIPID PANEL
HDL: 40 mg/dL (ref >39)
LDL Cholesterol: 39 mg/dL (ref 0–99)

## 2011-12-29 LAB — CBC
Platelets: 48 10*3/uL — ABNORMAL LOW (ref 150–400)
RBC: 3.7 MIL/uL — ABNORMAL LOW (ref 4.22–5.81)
WBC: 11.5 10*3/uL — ABNORMAL HIGH (ref 4.0–10.5)

## 2011-12-29 LAB — URINE CULTURE

## 2011-12-29 LAB — TSH: TSH: 0.498 u[IU]/mL (ref 0.350–4.500)

## 2011-12-29 LAB — CARDIAC PANEL(CRET KIN+CKTOT+MB+TROPI)
Relative Index: INVALID (ref 0.0–2.5)
Troponin I: <0.3 ng/mL (ref <0.30)

## 2011-12-29 LAB — APTT: aPTT: 88 seconds — ABNORMAL HIGH (ref 24–37)

## 2011-12-29 MED ORDER — ASPIRIN EC 81 MG PO TBEC
81.0000 mg | DELAYED_RELEASE_TABLET | Freq: Every day | ORAL | Status: DC
  Administered 2011-12-29 – 2012-01-03 (×6): 81 mg via ORAL
  Filled 2011-12-29 (×6): qty 1

## 2011-12-29 MED ORDER — WARFARIN SODIUM 5 MG PO TABS
5.0000 mg | ORAL_TABLET | Freq: Once | ORAL | Status: AC
  Administered 2011-12-29: 5 mg via ORAL
  Filled 2011-12-29: qty 1

## 2011-12-29 MED ORDER — IPRATROPIUM BROMIDE 0.02 % IN SOLN
0.5000 mg | Freq: Four times a day (QID) | RESPIRATORY_TRACT | Status: DC
  Administered 2011-12-29 – 2012-01-04 (×25): 0.5 mg via RESPIRATORY_TRACT
  Filled 2011-12-29 (×24): qty 2.5

## 2011-12-29 MED ORDER — LEVALBUTEROL HCL 0.63 MG/3ML IN NEBU: 0.6300 mg | INHALATION_SOLUTION | RESPIRATORY_TRACT | Status: DC | PRN

## 2011-12-29 MED ORDER — IPRATROPIUM-ALBUTEROL 18-103 MCG/ACT IN AERO
2.0000 | INHALATION_SPRAY | Freq: Four times a day (QID) | RESPIRATORY_TRACT | Status: DC
  Filled 2011-12-29: qty 14.7

## 2011-12-29 MED ORDER — LEVALBUTEROL HCL 0.63 MG/3ML IN NEBU
0.6300 mg | INHALATION_SOLUTION | Freq: Four times a day (QID) | RESPIRATORY_TRACT | Status: DC
  Administered 2011-12-29 – 2012-01-04 (×25): 0.63 mg via RESPIRATORY_TRACT
  Filled 2011-12-29 (×29): qty 3

## 2011-12-29 MED ORDER — FUROSEMIDE 10 MG/ML IJ SOLN
80.0000 mg | Freq: Two times a day (BID) | INTRAMUSCULAR | Status: DC
  Administered 2011-12-29 – 2012-01-02 (×8): 80 mg via INTRAVENOUS
  Filled 2011-12-29 (×10): qty 8

## 2011-12-29 MED ORDER — PATIENT'S GUIDE TO USING COUMADIN BOOK
Freq: Once | Status: AC
  Administered 2011-12-29: 18:00:00
  Filled 2011-12-29: qty 1

## 2011-12-29 MED ORDER — WARFARIN VIDEO: Freq: Once | Status: DC

## 2011-12-29 NOTE — Progress Notes (Signed)
Utilization Review Completed.Emmalin Jaquess T4/29/2013   

## 2011-12-29 NOTE — Progress Notes (Addendum)
Pt admitted with new onset CHF in the setting of afib with RVR.  SUBJECTIVE: SOB is gradually improving.  He is very wheezy this am.     . albuterol-ipratropium  2 puff Inhalation Q6H  . aspirin EC  81 mg Oral Daily  . azithromycin  500 mg Intravenous Q24H  . azithromycin  500 mg Oral Once  . cefTRIAXone (ROCEPHIN)  IV  1 g Intravenous Once  . cefTRIAXone (ROCEPHIN)  IV  1 g Intravenous Q24H  . diltiazem  10 mg Intravenous Once  . docusate sodium  100 mg Oral BID  . furosemide  60 mg Intravenous Once  . furosemide  80 mg Intravenous Once  . furosemide  80 mg Intravenous Q12H  . guaiFENesin  1,200 mg Oral BID  . heparin  2,000 Units Intravenous Once  . nitroGLYCERIN  2-200 mcg/min Intravenous Once  . pantoprazole  40 mg Oral Q1200  . potassium chloride  40 mEq Oral Once  . potassium chloride  20 mEq Oral BID  . sodium chloride  3 mL Intravenous Q12H  . sodium chloride  3 mL Intravenous Q12H  . warfarin  2.5 mg Oral Once  . Warfarin - Pharmacist Dosing Inpatient   Does not apply q1800  . DISCONTD: sodium chloride   Intravenous STAT  . DISCONTD: aspirin EC  325 mg Oral Daily  . DISCONTD: furosemide  40 mg Intravenous Q12H  . DISCONTD: warfarin  7.5 mg Oral Once      . diltiazem (CARDIZEM) infusion 15 mg/hr (12/29/11 0700)  . heparin 1,400 Units/hr (12/29/11 0700)  . DISCONTD: sodium chloride      OBJECTIVE: Physical Exam: Filed Vitals:   12/29/11 0600 12/29/11 0630 12/29/11 0700 12/29/11 0730  BP: 96/59 93/65 97/61  96/60  Pulse: 93 104 93 90  Temp:      TempSrc:      Resp: 17     Height:      Weight:      SpO2: 90% 90% 90% 90%    Intake/Output Summary (Last 24 hours) at 12/29/11 0833 Last data filed at 12/29/11 0700  Gross per 24 hour  Intake    290 ml  Output   1225 ml  Net   -935 ml    Telemetry reveals afib, V rates 110s  GEN- The patient is elderly appearing, alert and oriented x 3 today.   Head- normocephalic, atraumatic Eyes-  Sclera clear,  conjunctiva pink Ears- hearing intact Oropharynx- clear Neck- supple, JVP 10cm Lungs- diffuse expiratory wheezes Heart- irregular rate and rhythm, no murmurs, rubs or gallops, PMI not laterally displaced GI- soft, NT, ND, + BS Extremities- no clubbing, cyanosis, or edema Skin- no rash or lesion Psych- euthymic mood, full affect Neuro- strength and sensation are intact  LABS: Basic Metabolic Panel:  Basename 12/29/11 0523 12/28/11 1310  NA 138 138  K 4.1 4.3  CL 103 104  CO2 26 24  GLUCOSE 151* 120*  BUN 30* 20  CREATININE 1.24 1.07  CALCIUM 8.6 8.7  MG -- 2.0  PHOS -- 3.2   Liver Function Tests:  Basename 12/29/11 0523  AST 23  ALT 34  ALKPHOS 58  BILITOT 1.0  PROT 6.2  ALBUMIN 2.7*   No results found for this basename: LIPASE:2,AMYLASE:2 in the last 72 hours CBC:  Basename 12/29/11 0523 12/28/11 1310  WBC 11.5* 12.1*  NEUTROABS -- --  HGB 11.7* 12.5*  HCT 35.3* 38.1*  MCV 95.4 96.2  PLT 48* 71*   Cardiac  Enzymes:  Basename 12/29/11 0523 12/28/11 2204  CKTOTAL 67 66  CKMB 3.8 3.7  CKMBINDEX -- --  TROPONINI <0.30 <0.30   Fasting Lipid Panel:  Basename 12/29/11 0523  CHOL 85  HDL 40  LDLCALC 39  TRIG 30  CHOLHDL 2.1  LDLDIRECT --    ASSESSMENT AND PLAN:  Active Problems:  CHF, acute  Community acquired pneumonia  New onset a-fib  HTN (hypertension), benign  1.  afib with RVR- continue cardizem drip Heparin gtt and coumadin I spoke with his PA from the [place] system who will check to see if he is a candidate for pradaxa, though I suspect that he will require coumadin from the [place] with lovenox bridge at DC Would anticipate TEE guided cardioversion once diuresed a bit more  2. Acute CHF- awaiting echo Continue diuresis and rate control Will likely improve with sinus rhythm  CMs are negative this far,  Consider further cardiac risk stratification pending echo results.   3. Acute respiratory failure- likely due to above,  Will add  bronchodilators for wheezing Follow closely.  Hillis Range, MD 12/29/2011 8:33 AM

## 2011-12-29 NOTE — Progress Notes (Signed)
ANTICOAGULATION CONSULT NOTE - Follow Up Consult  Pharmacy Consult for heparin Indication: atrial fibrillation  Labs:  [facility] 12/29/11 0108 12/28/11 2204 12/28/11 1310  HGB -- -- 12.5*  HCT -- -- 38.1*  PLT -- -- 71*  APTT -- -- --  LABPROT -- -- 15.5*  INR -- -- 1.20  HEPARINUNFRC 0.26* -- --  CREATININE -- -- 1.07  CKTOTAL -- 66 --  CKMB -- 3.7 --  TROPONINI -- <0.30 --    Assessment: 78yo male slightly subtherapeutic on heparin with initial dosing for Afib.  Goal of Therapy:  Heparin level 0.3-0.7 units/ml   Plan:  Will increase gtt by 1 unit/kg/hr to 1400 units/hr and check level with next CE.  Colleen Can PharmD BCPS 12/29/2011,2:34 AM

## 2011-12-29 NOTE — Progress Notes (Signed)
[facility] [place] TEAM 1 - Stepdown/ICU TEAM  Subjective: States no further awareness of tachypalpitations. Shortness of breath has also improved. Endorses a several week history of exercise intolerance where previously he can walk for 45 minutes without becoming winded - over the past 2 weeks this has worsened. Also, patient observed his heart rate had been consistently greater than 140.  Objective: Blood pressure 115/58, pulse 68, temperature 98.1 F (36.7 C), temperature source Oral, resp. rate 23, height 5\' 10"  (1.778 m), weight 96.6 kg (212 lb 15.4 oz), SpO2 95.00%.  Intake/Output from previous day: 04/28 0701 - 04/29 0700 In: 305 [I.V.:305] Out: 1225 [Urine:1225] Intake/Output this shift: Total I/O In: 230 [P.O.:120; I.V.:110] Out: 350 [Urine:350]  General appearance: alert, cooperative, appears stated age and no distress Resp: clear to auscultation bilaterally Cardio: atrial fibrillation, S1, S2 normal, no murmur, click, rub or gallo GI: soft, non-tender; bowel sounds normal; no masses,  no organomegaly Extremities: extremities normal, atraumatic, no cyanosis or edema Neurologic: Grossly normal  Lab Results:  Basename 12/29/11 0523 12/28/11 1310  WBC 11.5* 12.1*  HGB 11.7* 12.5*  HCT 35.3* 38.1*  PLT 48* 71*   BMET  Basename 12/29/11 0523 12/28/11 1310  NA 138 138  K 4.1 4.3  CL 103 104  CO2 26 24  GLUCOSE 151* 120*  BUN 30* 20  CREATININE 1.24 1.07  CALCIUM 8.6 8.7   Studies/Results: Dg Chest Portable 1 View  12/28/2011  *RADIOLOGY REPORT*  Clinical Data: Atrial fibrillation.  History of congestive heart failure.  PORTABLE CHEST - 1 VIEW  Comparison: 07/18/2005.  Findings: 1310 hours.  There is mild patient rotation to the right. The heart is enlarged.  There is vascular congestion with new perihilar and lower lobe air space opacities most consistent with edema.  There are probable small bilateral pleural effusions.  No pneumothorax is evident.   IMPRESSION: Bilateral air space opacities and probable pleural effusions, most consistent with congestive heart failure.  Original Report Authenticated By: Gerrianne Scale, M.D.   Medications: I have reviewed the patient's current medications.  Assessment/Plan:  Atrial fibrillation with rapid ventricular response/New onset a-fib *Rate currently much better controlled with IV Cardizem *Appreciate assistance of cardiology and electrophysiology  *Plans are for long-term chronic anticoagulation-patient prefers Pradaxa over Coumadin- currently pharmacy is managing heparin and Coumadin dosing *TEE guided cardioversion pending improved respiratory status after diuresis   CHF, acute *2-D echocardiogram pending *Continue Lasix 80 mg IV every 12 hours or as directed by cardiology *Likely onset after prolonged tachycardia i.e. tachycardia induced cardiomyopathy *Cardiac isoenzymes are negative so no indication for acute ischemic etiology  Community acquired pneumonia vs. Acute bronchitis *Had mild leukocytosis at presentation but based on history given to [place] by the patient it seems as if patient had tachycardia which then led to respiratory symptoms *No focal infiltrate seen on x-ray *Continue empiric antibiotic coverage for community-acquired pneumonia *Continue mucolytic and nebulizer therapy-Atrovent; Xopenex  HTN (hypertension), benign *Blood pressure well controlled  Disposition *Remain in stepdown   LOS: 1 day   Junious Silk, ANP pager [phone]  [facility]team 1 [email or web address] Password: [email or web address]  12/29/2011, 1:30 PM  I have personally examined this patient and reviewed the entire database. I have reviewed the above note, made any necessary editorial changes, and agree with its content.  Lonia Blood, MD [facility]

## 2011-12-29 NOTE — Progress Notes (Signed)
  Echocardiogram 2D Echocardiogram has been performed.  Wendelyn Kiesling, Real Cons 12/29/2011, 5:20 PM

## 2011-12-29 NOTE — Progress Notes (Addendum)
ANTICOAGULATION CONSULT NOTE - Follow Up Consult  Pharmacy Consult for heparin/warfarin Indication: atrial fibrillation  Labs:  Basename 12/29/11 1150 12/29/11 0523 12/29/11 0108 12/28/11 2204 12/28/11 1310  HGB -- 11.7* -- -- 12.5*  HCT -- 35.3* -- -- 38.1*  PLT -- 48* -- -- 71*  APTT -- 88* -- -- --  LABPROT 16.8* -- -- -- 15.5*  INR 1.34 -- -- -- 1.20  HEPARINUNFRC 0.39 -- 0.26* -- --  CREATININE -- 1.24 -- -- 1.07  CKTOTAL 73 67 -- 66 --  CKMB 4.4* 3.8 -- 3.7 --  TROPONINI <0.30 <0.30 -- <0.30 --    Assessment: 78yo male now at goal on heparin>>warfarin dosing for Afib. INR trending up after low dose last night but still below goal. No bleeding complications noted. Of note platelets were low on admit but have now fallen more 71>>48. Will continue at current rate and follow am cbc  Goal of Therapy:  Heparin level 0.3-0.7 units/ml  INR goal 2-3  Plan:  Continue heparin at 1400 units/hr Warfarin 5mg  tonight  Sheppard Coil PharmD BCPS 12/29/2011,1:13 PM

## 2011-12-30 ENCOUNTER — Encounter (HOSPITAL_COMMUNITY): Payer: Self-pay | Admitting: Anesthesiology

## 2011-12-30 ENCOUNTER — Encounter (HOSPITAL_COMMUNITY)
Admission: EM | Disposition: A | Discharge status: Home / Self Care | Payer: Self-pay | Source: Home / Self Care | Attending: Internal Medicine

## 2011-12-30 ENCOUNTER — Encounter (HOSPITAL_COMMUNITY): Payer: Self-pay | Admitting: *Deleted

## 2011-12-30 DIAGNOSIS — I519 Heart disease, unspecified: Secondary | ICD-10-CM | POA: Diagnosis present

## 2011-12-30 DIAGNOSIS — I4891 Unspecified atrial fibrillation: Secondary | ICD-10-CM

## 2011-12-30 DIAGNOSIS — D696 Thrombocytopenia, unspecified: Secondary | ICD-10-CM | POA: Diagnosis present

## 2011-12-30 DIAGNOSIS — R1319 Other dysphagia: Secondary | ICD-10-CM

## 2011-12-30 HISTORY — PX: CARDIOVERSION: SHX1299

## 2011-12-30 HISTORY — PX: ESOPHAGOGASTRODUODENOSCOPY: SHX5428

## 2011-12-30 HISTORY — PX: TEE WITHOUT CARDIOVERSION: SHX5443

## 2011-12-30 LAB — BASIC METABOLIC PANEL
BUN: 35 mg/dL — ABNORMAL HIGH (ref 6–23)
GFR calc Af Amer: 56 mL/min — ABNORMAL LOW (ref >90)
GFR calc non Af Amer: 48 mL/min — ABNORMAL LOW (ref >90)
Potassium: 3.9 mEq/L (ref 3.5–5.1)
Sodium: 139 mEq/L (ref 135–145)

## 2011-12-30 LAB — CULTURE, RESPIRATORY W GRAM STAIN

## 2011-12-30 LAB — CBC
Hemoglobin: 11.4 g/dL — ABNORMAL LOW (ref 13.0–17.0)
MCHC: 32.6 g/dL (ref 30.0–36.0)
RBC: 3.65 MIL/uL — ABNORMAL LOW (ref 4.22–5.81)

## 2011-12-30 LAB — PROTIME-INR: Prothrombin Time: 17.2 seconds — ABNORMAL HIGH (ref 11.6–15.2)

## 2011-12-30 LAB — HEPARIN LEVEL (UNFRACTIONATED): Heparin Unfractionated: 0.49 IU/mL (ref 0.30–0.70)

## 2011-12-30 SURGERY — EGD (ESOPHAGOGASTRODUODENOSCOPY)
Anesthesia: Moderate Sedation

## 2011-12-30 SURGERY — ECHOCARDIOGRAM, TRANSESOPHAGEAL
Anesthesia: Moderate Sedation | Site: Esophagus

## 2011-12-30 SURGERY — Surgical Case
Anesthesia: *Unknown

## 2011-12-30 SURGERY — CARDIOVERSION
Anesthesia: Monitor Anesthesia Care

## 2011-12-30 MED ORDER — BENZOCAINE 20 % MT SOLN: 1.0000 "application " | OROMUCOSAL | Status: DC | PRN

## 2011-12-30 MED ORDER — SODIUM CHLORIDE 0.9 % IJ SOLN: 3.0000 mL | INTRAMUSCULAR | Status: DC | PRN

## 2011-12-30 MED ORDER — SODIUM CHLORIDE 0.9 % IJ SOLN: 3.0000 mL | Freq: Two times a day (BID) | INTRAMUSCULAR | Status: DC

## 2011-12-30 MED ORDER — SODIUM CHLORIDE 0.9 % IV SOLN: 250.0000 mL | INTRAVENOUS | Status: DC | PRN

## 2011-12-30 MED ORDER — MIDAZOLAM HCL 10 MG/2ML IJ SOLN: 10.0000 mg | Freq: Once | INTRAMUSCULAR | Status: DC

## 2011-12-30 MED ORDER — WARFARIN SODIUM 7.5 MG PO TABS
7.5000 mg | ORAL_TABLET | Freq: Once | ORAL | Status: AC
  Administered 2011-12-30: 7.5 mg via ORAL
  Filled 2011-12-30: qty 1

## 2011-12-30 MED ORDER — SODIUM CHLORIDE 0.45 % IV SOLN: INTRAVENOUS | Status: DC

## 2011-12-30 MED ORDER — MIDAZOLAM HCL 10 MG/2ML IJ SOLN
INTRAMUSCULAR | Status: AC
  Filled 2011-12-30: qty 4

## 2011-12-30 MED ORDER — FENTANYL CITRATE 0.05 MG/ML IJ SOLN
INTRAMUSCULAR | Status: AC
  Filled 2011-12-30: qty 4

## 2011-12-30 MED ORDER — BUTAMBEN-TETRACAINE-BENZOCAINE 2-2-14 % EX AERO
INHALATION_SPRAY | CUTANEOUS | Status: DC | PRN
  Administered 2011-12-30: 2 via TOPICAL
  Administered 2011-12-30: 1 via TOPICAL

## 2011-12-30 MED ORDER — MIDAZOLAM HCL 10 MG/2ML IJ SOLN
INTRAMUSCULAR | Status: DC | PRN
  Administered 2011-12-30 (×3): 2 mg via INTRAVENOUS

## 2011-12-30 MED ORDER — FENTANYL CITRATE 0.05 MG/ML IJ SOLN
INTRAMUSCULAR | Status: DC | PRN
  Administered 2011-12-30 (×2): 25 ug via INTRAVENOUS

## 2011-12-30 MED ORDER — FENTANYL CITRATE 0.05 MG/ML IJ SOLN: 250.0000 ug | Freq: Once | INTRAMUSCULAR | Status: DC

## 2011-12-30 NOTE — Progress Notes (Signed)
ANTICOAGULATION CONSULT NOTE - Follow Up Consult  Pharmacy Consult for heparin/warfarin Indication: atrial fibrillation  Labs:  Basename 12/30/11 1050 12/30/11 0500 12/29/11 1150 12/29/11 0523 12/29/11 0108 12/28/11 2204 12/28/11 1310  HGB -- 11.4* -- 11.7* -- -- --  HCT -- 35.0* -- 35.3* -- -- 38.1*  PLT -- 55* -- 48* -- -- 71*  APTT -- -- -- 88* -- -- --  LABPROT -- 17.2* 16.8* -- -- -- 15.5*  INR -- 1.38 1.34 -- -- -- 1.20  HEPARINUNFRC 0.49 -- 0.39 -- 0.26* -- --  CREATININE -- 1.37* -- 1.24 -- -- 1.07  CKTOTAL -- -- 73 67 -- 66 --  CKMB -- -- 4.4* 3.8 -- 3.7 --  TROPONINI -- -- <0.30 <0.30 -- <0.30 --    Assessment: 78yo male now at goal on heparin>>warfarin dosing for Afib. Heparin at goal, INR trending up but still subtherapeutic. No bleeding complications noted. Platelets still low but up today 48>>55. Will continue at current rate and follow am platelets.  Scheduled for TEE/DCCV this afternoon.  Goal of Therapy:  Heparin level 0.3-0.7 units/ml  INR goal 2-3  Plan:  Continue heparin at 1400 units/hr Warfarin 7.5mg  tonight  Sheppard Coil PharmD BCPS 12/30/2011,11:54 AM

## 2011-12-30 NOTE — Progress Notes (Signed)
[facility] [place] TEAM 1 - Stepdown/ICU TEAM  Subjective: No complaints regarding chest pain or shortness of breath. Still having frequent productive cough with sputum being green and blood-tinged and color. Son is not at the bedside today.  Objective: Blood pressure 122/87, pulse 122, temperature 96.7 F (35.9 C), temperature source Oral, resp. rate 26, height 5\' 10"  (1.778 m), weight 96.1 kg (211 lb 13.8 oz), SpO2 97.00%.  Intake/Output from previous day: 04/29 0701 - 04/30 0700 In: 1638 [P.O.:760; I.V.:570; IV Piggyback:308] Out: 1952 [Urine:1950; Stool:2] Intake/Output this shift: Total I/O In: 195 [I.V.:195] Out: 1175 [Urine:1175]  General appearance: alert, cooperative, appears stated age and no distress Resp: More coarse today with midfield rhonchi, productive cough green and blood-tinged in nature, 3 L nasal cannula oxygen saturations 97% Cardio: atrial fibrillation ventricular response has been between 65 and 153, S1, S2 normal, no murmur, click, rub or gallop, remains on IV heparin and continuous Cardizem infusion GI: soft, non-tender; bowel sounds normal; no masses,  no organomegaly Extremities: extremities normal, atraumatic, no cyanosis or edema Neurologic: Grossly normal  Lab Results:  Basename 12/30/11 0500 12/29/11 0523  WBC 13.1* 11.5*  HGB 11.4* 11.7*  HCT 35.0* 35.3*  PLT 55* 48*   BMET  Basename 12/30/11 0500 12/29/11 0523  NA 139 138  K 3.9 4.1  CL 101 103  CO2 27 26  GLUCOSE 115* 151*  BUN 35* 30*  CREATININE 1.37* 1.24  CALCIUM 8.7 8.6   Studies/Results: No results found. Medications: I have reviewed the patient's current medications.  Assessment/Plan:  Atrial fibrillation with rapid ventricular response/New onset a-fib *Despite Cardizem infusion patient still has issues with episodic bursts of rapid ventricular response *Appreciate assistance of cardiology and electrophysiology  *Plan for TEE and if okay was to proceed with  DCCV today. 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 It is noted that the patient has had persistent tachycardia with rates between 110 and 140 for at least 2-3 weeks possibly longer and this is suggestive of reversible tachycardia induced cardiomyopathy *Continue Lasix 80 mg IV every 12 hours or as directed by cardiology *Likely onset after prolonged tachycardia i.e. tachycardia induced cardiomyopathy *Cardiac isoenzymes are negative so no indication for acute ischemic etiology *Suspect once patient rate and/or rhythm controlled and patient has had enough time for myocardial stunning to reverse will need followup 2-D echocardiogram. Ejection fraction remains depressed may require cardiac catheterization to rule out underlying ischemia despite current negative evaluation.  Community acquired pneumonia vs. Acute bronchitis *Now having abnormal lung sounds and thick discolored sputum *No focal infiltrate seen on x-ray *Continue empiric antibiotic coverage for community-acquired pneumonia *Continue mucolytic and nebulizer therapy-Atrovent; Xopenex  HTN (hypertension), benign *Blood pressure well controlled  Disposition *Remain in stepdown   LOS: 2 days   Junious Silk, ANP pager [phone]  [facility] [email or web address] Password: [email or web address]  12/30/2011, 2:31 PM    I have examined the pateint and reviewed the chart. TEE reveals LAA thrombus. Coumadin has been started. I agree with the above note.   Calvert Cantor,  MD [phone]

## 2011-12-30 NOTE — Interval H&P Note (Signed)
History and Physical Interval Note:  12/30/2011 1:50 PM  Jimmy Myers  has presented today for surgery, with the diagnosis of a fib  The various methods of treatment have been discussed with the patient and family. After consideration of risks, benefits and other options for treatment, the patient has consented to  Procedure(s) (LRB): TRANSESOPHAGEAL ECHOCARDIOGRAM (TEE) (N/A) CARDIOVERSION (N/A) as a surgical intervention .  The patients' history has been reviewed, patient examined, no change in status, stable for surgery.  I have reviewed the patients' chart and labs.  Questions were answered to the patient's satisfaction.     Olga Millers

## 2011-12-30 NOTE — Op Note (Signed)
[facility] [address] [place], [place]  [address]  ENDOSCOPY PROCEDURE REPORT  PATIENT:  Jimmy Myers, Jimmy Myers  MR#:  [id number] BIRTHDATE:  13-Mar-1932, 79 yrs. old  GENDER:  male  ENDOSCOPIST:  Hedwig Morton. Juanda Chance, MD Referred by:  Madolyn Frieze. Jens Som, M.D.  PROCEDURE DATE:  12/30/2011 PROCEDURE:  EGD, diagnostic 43235 ASA CLASS:  Class III INDICATIONS:  unable to pass TEE scope, suspected obstruction  MEDICATIONS:   These medications were titrated to patient response per physician's verbal order, Versed 2 mg, Fentanyl 25 mcg TOPICAL ANESTHETIC:  Cetacaine Spray  DESCRIPTION OF PROCEDURE:   After the risks benefits and alternatives of the procedure were thoroughly explained, informed consent was obtained.  The Pentax Gastroscope [id number] endoscope was introduced through the mouth and advanced to the second portion of the duodenum, without limitations.  The instrument was slowly withdrawn as the mucosa was fully examined. <<PROCEDUREIMAGES>>  The upper, middle, and distal third of the esophagus were carefully inspected and no abnormalities were noted. The z-line was well seen at the GEJ. The endoscope was pushed into the fundus which was normal including a retroflexed view. The antrum,gastric body, first and second part of the duodenum were unremarkable (see image001, image002, image003, image004, image005, and image006). large amount of keratinized secretions in the esophagus, no stricture,    Retroflexed views revealed no abnormalities.    The scope was then withdrawn from the patient and the procedure completed.  COMPLICATIONS:  None  ENDOSCOPIC IMPRESSION: 1) Normal EGD retained white secretions likely cause of difficulty in passing the TEE scope beyong UES, RECOMMENDATIONS: we have cleared most of the secretions by irrigation of the esophageal lumen  REPEAT EXAM:  In 0 year(s) for.  ______________________________ Hedwig Morton. Juanda Chance, MD  CC:  n. eSIGNED:    Hedwig Morton. Kadeen Sroka at 12/30/2011 03:28 PM  Marlyce Huge, [phone]

## 2011-12-30 NOTE — Preoperative (Signed)
Beta Blockers   Reason not to administer Beta Blockers:Not Applicable 

## 2011-12-30 NOTE — H&P (View-Only) (Signed)
Patient Name: Jimmy Myers Date of Encounter: 12/30/2011  Principal Problem:  *Atrial fibrillation with rapid ventricular response Active Problems:  CHF, acute  Community acquired pneumonia  New onset a-fib  HTN (hypertension), benign  Acute bronchitis    SUBJECTIVE: Pt has had no chest pain. SOB some better. Still coughing at times.   OBJECTIVE Filed Vitals:   12/30/11 0531 12/30/11 0600 12/30/11 0700 12/30/11 0726  BP:  120/80 116/74   Pulse:  101 97   Temp:      TempSrc:    Oral  Resp:  25 17   Height:      Weight: 211 lb 13.8 oz (96.1 kg)     SpO2:  93% 92%     Intake/Output Summary (Last 24 hours) at 12/30/11 0733 Last data filed at 12/30/11 0718  Gross per 24 hour  Intake   1638 ml  Output   2202 ml  Net   -564 ml   Weight change: -8 lb 2.2 oz (-3.691 kg) Filed Weights   12/28/11 1825 12/30/11 0500 12/30/11 0531  Weight: 212 lb 15.4 oz (96.6 kg) 211 lb 13.8 oz (96.1 kg) 211 lb 13.8 oz (96.1 kg)     PHYSICAL EXAM General: Well developed, well nourished, male in no acute distress. Head: Normocephalic, atraumatic.  Neck: Supple without bruits, JVD 6-8 cm. Lungs:  Resp regular and unlabored, diffuse exp wheeze and rales, coarse R basilar BS Heart: rapid, irregular R&R, S1, S2, no S3, S4, or murmur. Abdomen: Soft, non-tender, non-distended, BS + x 4.  Extremities: No clubbing, cyanosis, trace edema.  Neuro: Alert and oriented X 3. Moves all extremities spontaneously. Psych: Normal affect.  LABS: CBC: Basename 12/30/11 0500 12/29/11 0523  WBC 13.1* 11.5*  NEUTROABS -- --  HGB 11.4* 11.7*  HCT 35.0* 35.3*  MCV 95.9 95.4  PLT 55* 48*   INR: Basename 12/30/11 0500  INR 1.38   Basic Metabolic Panel: Basename 12/30/11 0500 12/29/11 0523 12/28/11 1310  NA 139 138 --  K 3.9 4.1 --  CL 101 103 --  CO2 27 26 --  GLUCOSE 115* 151* --  BUN 35* 30* --  CREATININE 1.37* 1.24 --  CALCIUM 8.7 8.6 --  MG -- -- 2.0  PHOS -- -- 3.2   Liver Function  Tests: [facility] 12/29/11 0523  AST 23  ALT 34  ALKPHOS 58  BILITOT 1.0  PROT 6.2  ALBUMIN 2.7*   Cardiac Enzymes: Basename 12/29/11 1150 12/29/11 0523 12/28/11 2204  CKTOTAL 73 67 66  CKMB 4.4* 3.8 3.7  CKMBINDEX -- -- --  TROPONINI <0.30 <0.30 <0.30   BNP: Pro B Natriuretic peptide (BNP)  Date/Time Value Range Status  12/29/2011  5:23 AM 5562.0* 0-450 (pg/mL) Final  12/28/2011  1:10 PM 3452.0* 0-450 (pg/mL) Final   Fasting Lipid Panel: Basename 12/29/11 0523  CHOL 85  HDL 40  LDLCALC 39  TRIG 30  CHOLHDL 2.1  LDLDIRECT --   Thyroid Function Tests: Basename 12/28/11 2204  TSH 0.498  T4TOTAL --  T3FREE --  THYROIDAB --    TELE:   A. Fib RVR     Radiology/Studies: Dg Chest Portable 1 View 12/28/2011  *RADIOLOGY REPORT*  Clinical Data: Atrial fibrillation.  History of congestive heart failure.  PORTABLE CHEST - 1 VIEW  Comparison: 07/18/2005.  Findings: 1310 hours.  There is mild patient rotation to the right. The heart is enlarged.  There is vascular congestion with new perihilar and lower lobe air space opacities most  consistent with edema.  There are probable small bilateral pleural effusions.  No pneumothorax is evident.  IMPRESSION: Bilateral air space opacities and probable pleural effusions, most consistent with congestive heart failure.  Original Report Authenticated By: Gerrianne Scale, M.D.    Current Medications:     . aspirin EC  81 mg Oral Daily  . azithromycin  500 mg Intravenous Q24H  . cefTRIAXone (ROCEPHIN)  IV  1 g Intravenous Q24H  . docusate sodium  100 mg Oral BID  . furosemide  80 mg Intravenous Q12H  . guaiFENesin  1,200 mg Oral BID  . ipratropium  0.5 mg Nebulization QID  . levalbuterol  0.63 mg Nebulization QID  . pantoprazole  40 mg Oral Q1200  . patient's guide to using coumadin book   Does not apply Once  . potassium chloride  20 mEq Oral BID  . sodium chloride  3 mL Intravenous Q12H  . warfarin  5 mg Oral ONCE-1800  . warfarin    Does not apply Once  . Warfarin - Pharmacist Dosing Inpatient   Does not apply q1800  . DISCONTD: albuterol-ipratropium  2 puff Inhalation Q6H  . DISCONTD: aspirin EC  325 mg Oral Daily  . DISCONTD: furosemide  40 mg Intravenous Q12H  . DISCONTD: sodium chloride  3 mL Intravenous Q12H      . diltiazem (CARDIZEM) infusion 15 mg/hr (12/30/11 0137)  . heparin 1,400 Units/hr (12/30/11 0526)    ASSESSMENT AND PLAN:  Principal Problem:  *Atrial fibrillation with rapid ventricular response - SBP now 100s-110s on cardizem at 15 mg/hr but HR not well controlled. TEE/DCCV scheduled for 1:30 today. Explained procedure and answered all questions. Continue Rx at current rate till procedure.   Active Problems:  CHF, acute - s/p echo, results pending, volume status improved, recheck BMET in am, possible change to PO Rx then.   Community acquired pneumonia - per primary MD   New onset a-fib - unknown duration but > 48 hours, see above   HTN (hypertension), benign - per primary MD   Acute bronchitis - per primary MD  Anticoagulation - hep->coumadin, initial f/u at [facility], pt can transfer to [place] when able.  Otherwise, per primary MD.  Signed, Theodore Demark , PA-C 7:33 AM 12/30/2011   I have seen, examined the patient, and reviewed the above assessment and plan.  Pt with admission for pneumonia, CHF, and afib with RVR.  Echo reveals LVEDD 70 with EF 20-25%.  Presently, he has pneumonia/ wheezing on exam as well as volume overload.  I anticipate that he would do better in sinus rhythm.  Risks, benefits, and alternatives to TEE guided cardioversion were discussed at length with the patient who wishes to proceed.  Following cardioversion, I would consider initiation of tikosyn for rhythm control long term.  Given CHF, he is not a candidate for other antiarrhythmics.  Given pneumonia/ lung issues, I would like to avoid amiodarone. Continue coumadin and heparin gtt.  Continue diuresis. Continue  antibiotics for pneumonia. We will initiate medical therapy for his cardiomyopathy as able.   Co Sign: Hillis Range, MD 12/30/2011 11:56 AM

## 2011-12-30 NOTE — Anesthesia Postprocedure Evaluation (Signed)
  Anesthesia Post-op Note Case cancelled by Dr Deborah Chalk due to blood clot in atrium

## 2011-12-30 NOTE — Anesthesia Preprocedure Evaluation (Addendum)
Anesthesia Evaluation  Patient identified by MRN, date of birth, ID band Patient awake    Reviewed: Allergy & Precautions, H&P , NPO status , Patient's Chart, lab work & pertinent test results  History of Anesthesia Complications Negative for: history of anesthetic complications  Airway Mallampati: II TM Distance: >3 FB Neck ROM: Full    Dental  (+) Teeth Intact and Dental Advisory Given,    Pulmonary shortness of breath, with exertion and at rest, pneumonia  (admit with community aquired PNA; on Abx),  + rhonchi   + wheezing      Cardiovascular hypertension, Pt. on medications +CHF (acute) and + DOE + dysrhythmias (with RVR; Pt on Cardizem gtt) Atrial Fibrillation Rhythm:Irregular Rate:Normal  cardiomypathy EF 25% Admit with sign peripheral edema   Neuro/Psych PSYCHIATRIC DISORDERS Anxiety negative neurological ROS     GI/Hepatic negative GI ROS, Neg liver ROS,   Endo/Other  negative endocrine ROS  Renal/GU Renal InsufficiencyRenal disease     Musculoskeletal  (+) Arthritis -, Osteoarthritis,    Abdominal (+) + obese,   Peds  Hematology Heparin gtt Thrombocytopenia Plt 55,000   Anesthesia Other Findings   Reproductive/Obstetrics                          Anesthesia Physical Anesthesia Plan  ASA: III  Anesthesia Plan: General   Post-op Pain Management:    Induction: Intravenous  Airway Management Planned: Mask  Additional Equipment:   Intra-op Plan:   Post-operative Plan:   Informed Consent: I have reviewed the patients History and Physical, chart, labs and discussed the procedure including the risks, benefits and alternatives for the proposed anesthesia with the patient or authorized representative who has indicated his/her understanding and acceptance.   Dental advisory given  Plan Discussed with: CRNA and Surgeon  Anesthesia Plan Comments:        Anesthesia Quick  Evaluation

## 2011-12-30 NOTE — Progress Notes (Signed)
*  PRELIMINARY RESULTS* Echocardiogram Echocardiogram Transesophageal has been performed.  Glean Salen [facility] 12/30/2011, 4:15 PM

## 2011-12-30 NOTE — Progress Notes (Signed)
 Patient Name: Jimmy Myers Date of Encounter: 12/30/2011  Principal Problem:  *Atrial fibrillation with rapid ventricular response Active Problems:  CHF, acute  Community acquired pneumonia  New onset a-fib  HTN (hypertension), benign  Acute bronchitis    SUBJECTIVE: Pt has had no chest pain. SOB some better. Still coughing at times.   OBJECTIVE Filed Vitals:   12/30/11 0531 12/30/11 0600 12/30/11 0700 12/30/11 0726  BP:  120/80 116/74   Pulse:  101 97   Temp:      TempSrc:    Oral  Resp:  25 17   Height:      Weight: 211 lb 13.8 oz (96.1 kg)     SpO2:  93% 92%     Intake/Output Summary (Last 24 hours) at 12/30/11 0733 Last data filed at 12/30/11 0718  Gross per 24 hour  Intake   1638 ml  Output   2202 ml  Net   -564 ml   Weight change: -8 lb 2.2 oz (-3.691 kg) Filed Weights   12/28/11 1825 12/30/11 0500 12/30/11 0531  Weight: 212 lb 15.4 oz (96.6 kg) 211 lb 13.8 oz (96.1 kg) 211 lb 13.8 oz (96.1 kg)     PHYSICAL EXAM General: Well developed, well nourished, male in no acute distress. Head: Normocephalic, atraumatic.  Neck: Supple without bruits, JVD 6-8 cm. Lungs:  Resp regular and unlabored, diffuse exp wheeze and rales, coarse R basilar BS Heart: rapid, irregular R&R, S1, S2, no S3, S4, or murmur. Abdomen: Soft, non-tender, non-distended, BS + x 4.  Extremities: No clubbing, cyanosis, trace edema.  Neuro: Alert and oriented X 3. Moves all extremities spontaneously. Psych: Normal affect.  LABS: CBC: Basename 12/30/11 0500 12/29/11 0523  WBC 13.1* 11.5*  NEUTROABS -- --  HGB 11.4* 11.7*  HCT 35.0* 35.3*  MCV 95.9 95.4  PLT 55* 48*   INR: Basename 12/30/11 0500  INR 1.38   Basic Metabolic Panel: Basename 12/30/11 0500 12/29/11 0523 12/28/11 1310  NA 139 138 --  K 3.9 4.1 --  CL 101 103 --  CO2 27 26 --  GLUCOSE 115* 151* --  BUN 35* 30* --  CREATININE 1.37* 1.24 --  CALCIUM 8.7 8.6 --  MG -- -- 2.0  PHOS -- -- 3.2   Liver Function  Tests: Basename 12/29/11 0523  AST 23  ALT 34  ALKPHOS 58  BILITOT 1.0  PROT 6.2  ALBUMIN 2.7*   Cardiac Enzymes: Basename 12/29/11 1150 12/29/11 0523 12/28/11 2204  CKTOTAL 73 67 66  CKMB 4.4* 3.8 3.7  CKMBINDEX -- -- --  TROPONINI <0.30 <0.30 <0.30   BNP: Pro B Natriuretic peptide (BNP)  Date/Time Value Range Status  12/29/2011  5:23 AM 5562.0* 0-450 (pg/mL) Final  12/28/2011  1:10 PM 3452.0* 0-450 (pg/mL) Final   Fasting Lipid Panel: Basename 12/29/11 0523  CHOL 85  HDL 40  LDLCALC 39  TRIG 30  CHOLHDL 2.1  LDLDIRECT --   Thyroid Function Tests: Basename 12/28/11 2204  TSH 0.498  T4TOTAL --  T3FREE --  THYROIDAB --    TELE:   A. Fib RVR     Radiology/Studies: Dg Chest Portable 1 View 12/28/2011  *RADIOLOGY REPORT*  Clinical Data: Atrial fibrillation.  History of congestive heart failure.  PORTABLE CHEST - 1 VIEW  Comparison: 07/18/2005.  Findings: 1310 hours.  There is mild patient rotation to the right. The heart is enlarged.  There is vascular congestion with new perihilar and lower lobe air space opacities most   consistent with edema.  There are probable small bilateral pleural effusions.  No pneumothorax is evident.  IMPRESSION: Bilateral air space opacities and probable pleural effusions, most consistent with congestive heart failure.  Original Report Authenticated By: [name], M.D.    Current Medications:     . aspirin EC  81 mg Oral Daily  . azithromycin  500 mg Intravenous Q24H  . cefTRIAXone (ROCEPHIN)  IV  1 g Intravenous Q24H  . docusate sodium  100 mg Oral BID  . furosemide  80 mg Intravenous Q12H  . guaiFENesin  1,200 mg Oral BID  . ipratropium  0.5 mg Nebulization QID  . levalbuterol  0.63 mg Nebulization QID  . pantoprazole  40 mg Oral Q1200  . patient's guide to using coumadin book   Does not apply Once  . potassium chloride  20 mEq Oral BID  . sodium chloride  3 mL Intravenous Q12H  . warfarin  5 mg Oral ONCE-1800  . warfarin    Does not apply Once  . Warfarin - Pharmacist Dosing Inpatient   Does not apply q1800  . DISCONTD: albuterol-ipratropium  2 puff Inhalation Q6H  . DISCONTD: aspirin EC  325 mg Oral Daily  . DISCONTD: furosemide  40 mg Intravenous Q12H  . DISCONTD: sodium chloride  3 mL Intravenous Q12H      . diltiazem (CARDIZEM) infusion 15 mg/hr (12/30/11 0137)  . heparin 1,400 Units/hr (12/30/11 0526)    ASSESSMENT AND PLAN:  Principal Problem:  *Atrial fibrillation with rapid ventricular response - SBP now 100s-110s on cardizem at 15 mg/hr but HR not well controlled. TEE/DCCV scheduled for 1:30 today. Explained procedure and answered all questions. Continue Rx at current rate till procedure.   Active Problems:  CHF, acute - s/p echo, results pending, volume status improved, recheck BMET in am, possible change to PO Rx then.   Community acquired pneumonia - per primary MD   New onset a-fib - unknown duration but > 48 hours, see above   HTN (hypertension), benign - per primary MD   Acute bronchitis - per primary MD  Anticoagulation - hep->coumadin, initial f/u at [place], pt can [facility] when able.  Otherwise, per primary MD.  Signed, Rhonda Barrett , PA-C 7:33 AM 12/30/2011   I have seen, examined the patient, and reviewed the above assessment and plan.  Pt with admission for pneumonia, CHF, and afib with RVR.  Echo reveals LVEDD 70 with EF 20-25%.  Presently, he has pneumonia/ wheezing on exam as well as volume overload.  I anticipate that he would do better in sinus rhythm.  Risks, benefits, and alternatives to TEE guided cardioversion were discussed at length with the patient who wishes to proceed.  Following cardioversion, I would consider initiation of tikosyn for rhythm control long term.  Given CHF, he is not a candidate for other antiarrhythmics.  Given pneumonia/ lung issues, I would like to avoid amiodarone. Continue coumadin and heparin gtt.  Continue diuresis. Continue  antibiotics for pneumonia. We will initiate medical therapy for his cardiomyopathy as able.   Co Sign: Ardene Remley, MD 12/30/2011 11:56 AM   

## 2011-12-30 NOTE — Progress Notes (Signed)
12/30/2011 [facility], Bosie Clos [facility] Case Management Note [phone]    CARE MANAGEMENT NOTE 12/30/2011  Patient:  Jimmy Myers, Jimmy Myers   Account Number:  [id number]  Date Initiated:  12/30/2011  Documentation initiated by:  Fransico Michael  Subjective/Objective Assessment:   admitted on 12/28/11 with c/o shortness of breath and productive cough.     Action/Plan:   cardiac w/u for CHF  IV Abx for pna  cardizem/heparin drips   Anticipated DC Date:  01/02/2012   Anticipated DC Plan:  HOME W HOME HEALTH SERVICES      DC Planning Services  CM consult      Choice offered to / List presented to:             Status of service:  In process, will continue to follow Medicare Important Message given?   (If response is "NO", the following Medicare IM given date fields will be blank) Date Medicare IM given:   Date Additional Medicare IM given:    Discharge Disposition:    Per UR Regulation:  Reviewed for med. necessity/level of care/duration of stay  If discussed at Long Length of Stay Meetings, dates discussed:    Comments:  PCP: Josue Hector  ContactDarolyn Rua (spouse) [phone]  12/30/11-1412-J.Lutricia Horsfall [phone]      78yo male patient admitted on 12/28/11 with c/o shortness of breath and productive cough. Diagnosed with acute exacerbation of CHF and Atrial fib with RVR. Prior to admission, patient lived at home with spouse. Independent with ADLs.

## 2011-12-30 NOTE — Interval H&P Note (Signed)
History and Physical Interval Note:  12/30/2011 3:04 PM  Jimmy Myers  has presented today for surgery, with the diagnosis of difficulty passing scope for tee  The various methods of treatment have been discussed with the patient and family. After consideration of risks, benefits and other options for treatment, the patient has consented to  Procedure(s) (LRB): ESOPHAGOGASTRODUODENOSCOPY (EGD) (N/A) as a surgical intervention .  The patients' history has been reviewed, patient examined, no change in status, stable for surgery.  I have reviewed the patients' chart and labs.  Questions were answered to the patient's satisfaction.     Lina Sar

## 2011-12-30 NOTE — CV Procedure (Signed)
TEE/DCCV; TEE revealed LAA thrombus. Would recommend 4-6 weeks of coumadin with therapeutic INR and then repeat TEE.  Olga Millers

## 2011-12-31 ENCOUNTER — Encounter (HOSPITAL_COMMUNITY): Payer: Self-pay | Admitting: Cardiology

## 2011-12-31 DIAGNOSIS — I5021 Acute systolic (congestive) heart failure: Secondary | ICD-10-CM

## 2011-12-31 DIAGNOSIS — I10 Essential (primary) hypertension: Secondary | ICD-10-CM

## 2011-12-31 DIAGNOSIS — J159 Unspecified bacterial pneumonia: Secondary | ICD-10-CM

## 2011-12-31 DIAGNOSIS — I4891 Unspecified atrial fibrillation: Secondary | ICD-10-CM

## 2011-12-31 HISTORY — DX: Unspecified atrial fibrillation: I48.91

## 2011-12-31 LAB — BASIC METABOLIC PANEL
CO2: 29 mEq/L (ref 19–32)
GFR calc non Af Amer: 54 mL/min — ABNORMAL LOW (ref >90)
Glucose, Bld: 104 mg/dL — ABNORMAL HIGH (ref 70–99)
Potassium: 3.2 mEq/L — ABNORMAL LOW (ref 3.5–5.1)
Sodium: 137 mEq/L (ref 135–145)

## 2011-12-31 LAB — HEPARIN LEVEL (UNFRACTIONATED): Heparin Unfractionated: 0.41 IU/mL (ref 0.30–0.70)

## 2011-12-31 MED ORDER — WARFARIN SODIUM 5 MG PO TABS
5.0000 mg | ORAL_TABLET | Freq: Once | ORAL | Status: AC
  Administered 2011-12-31: 5 mg via ORAL
  Filled 2011-12-31: qty 1

## 2011-12-31 MED ORDER — POTASSIUM CHLORIDE CRYS ER 20 MEQ PO TBCR
40.0000 meq | EXTENDED_RELEASE_TABLET | Freq: Two times a day (BID) | ORAL | Status: DC
  Administered 2011-12-31 – 2012-01-05 (×10): 40 meq via ORAL
  Filled 2011-12-31 (×4): qty 2
  Filled 2011-12-31: qty 1
  Filled 2011-12-31 (×7): qty 2

## 2011-12-31 MED ORDER — POTASSIUM CHLORIDE CRYS ER 20 MEQ PO TBCR
40.0000 meq | EXTENDED_RELEASE_TABLET | Freq: Once | ORAL | Status: AC
  Administered 2011-12-31: 40 meq via ORAL
  Filled 2011-12-31: qty 2

## 2011-12-31 MED ORDER — POTASSIUM CHLORIDE 10 MEQ/100ML IV SOLN
10.0000 meq | INTRAVENOUS | Status: AC
  Administered 2011-12-31 (×4): 10 meq via INTRAVENOUS
  Filled 2011-12-31: qty 100
  Filled 2011-12-31: qty 300

## 2011-12-31 NOTE — Progress Notes (Signed)
eLink Physician-Brief Progress Note Patient Name: Jimmy Myers DOB: May 02, 1932 MRN: [id number]  Date of Service  12/31/2011   HPI/Events of Note     eICU Interventions  Hypokalemia, repleted    Intervention Category Intermediate Interventions: Electrolyte abnormality - evaluation and management  [name] 12/31/2011, 6:39 AM

## 2011-12-31 NOTE — Progress Notes (Addendum)
Patient: Jimmy Myers Date of Encounter: 12/31/2011, 8:51 AM Admit date: 12/28/2011     Subjective  Jimmy Myers reports his shortness of breath is somewhat better this AM. He denies chest pain or palpitations.   Objective   Filed Vitals:   12/31/11 0800  BP: 116/33  Pulse: 125  Temp: 98 F (36.7 C)  Resp: 18    Intake/Output Summary (Last 24 hours) at 12/31/11 0851 Last data filed at 12/31/11 0700  Gross per 24 hour  Intake   2060 ml  Output   3625 ml  Net  -1565 ml    Physical Exam: General: Well developed, 78 year old male in no acute distress. Head: Normocephalic, atraumatic, sclera non-icteric, no xanthomas, nares are without discharge.  Neck: Supple. JVD not elevated. Lungs: Coarse breath sounds on right with end expiratory wheezes bilaterally. Breathing is unlabored. Heart: Irregularly irregular. S1 S2 without murmurs, rubs, or gallops.  Abdomen: Soft, non-tender, non-distended with normoactive bowel sounds.  Msk:  Strength and tone appear normal for age. Extremities: No clubbing or cyanosis. No edema.  Distal pedal pulses are 2+ and equal bilaterally. Neuro: Alert and oriented X 3. Moves all extremities spontaneously. Psych:  Responds to questions appropriately with a normal affect.  Inpatient Medications:   . aspirin EC  81 mg Oral Daily  . azithromycin  500 mg Intravenous Q24H  . cefTRIAXone (ROCEPHIN)  IV  1 g Intravenous Q24H  . docusate sodium  100 mg Oral BID  . furosemide  80 mg Intravenous Q12H  . guaiFENesin  1,200 mg Oral BID  . ipratropium  0.5 mg Nebulization QID  . levalbuterol  0.63 mg Nebulization QID  . pantoprazole  40 mg Oral Q1200  . potassium chloride  10 mEq Intravenous Q1 Hr x 4  . potassium chloride  20 mEq Oral BID  . potassium chloride  40 mEq Oral Once  . sodium chloride  3 mL Intravenous Q12H  . warfarin  7.5 mg Oral ONCE-1800  . Warfarin - Pharmacist Dosing Inpatient   Does not apply q1800   . diltiazem (CARDIZEM) infusion 15  mg/hr (12/31/11 0215)  . heparin 1,400 Units/hr (12/30/11 2151)    Labs:  [facility] 12/31/11 0440 12/30/11 0500  NA 137 139  K 3.2* 3.9  CL 100 101  CO2 29 27  GLUCOSE 104* 115*  BUN 32* 35*  CREATININE 1.24 1.37*  CALCIUM 8.2* 8.7  MG -- --  PHOS -- --    Basename 12/29/11 0523  AST 23  ALT 34  ALKPHOS 58  BILITOT 1.0  PROT 6.2  ALBUMIN 2.7*    Basename 12/30/11 0500 12/29/11 0523  WBC 13.1* 11.5*  NEUTROABS -- --  HGB 11.4* 11.7*  HCT 35.0* 35.3*  MCV 95.9 95.4  PLT 55* 48*    Basename 12/29/11 1150 12/29/11 0523 12/28/11 2204  CKTOTAL 73 67 66  CKMB 4.4* 3.8 3.7  TROPONINI <0.30 <0.30 <0.30   Radiology: no CXR in last 24-48 hours  Transesophageal Echocardiogram 12/30/2011:  - Left ventricle: The cavity size was dilated. Systolic function was severely reduced. The estimated ejection fraction was in the range of 20% to 25%. Diffuse hypokinesis. - Aortic valve: Mild regurgitation. - Aortic root: The aortic root was moderately dilated. - Mitral valve: Moderate regurgitation. - Left atrium: The atrium was moderately dilated. Left atrial appendage with severe spontaneous contrast and thrombus noted at tip. Emptying velocity was reduced. - Right ventricle: The cavity size was mildly dilated.  Systolic function was moderately reduced. - Right atrium: The atrium was mildly dilated.  Telemetry: Persistent atrial fibrillation, rates remain in the 120-130s   Assessment and Plan  1.  Atrial fibrillation with difficult rate control - unable to perform DCCV yesterday due to LAA clot; continue warfarin for chronic anticoagulation and plan to repeat TEE in 4-6 weeks after therapeutic INRs; continue diltiazem for rate control with close watch over his BP and up-titrate as able in hopes of improving rate control; not a candidate for antiarrhythmic drug therapy at this time 2.  New LV dysfunction with acute systolic CHF - ? tachycardia mediated; if tachycardia mediated, he  will benefit from restoring and maintaining NSR with likely improvement in his LV function; however, cannot definitively exclude coronary ischemia so may need to consider LHC during this hospitalization; start appropriate medical therapy when able 3.  LAA clot - continue warfarin 4.  Thrombocytopenia - ? etiology; patient reports low platelets x 1 year, followed by the [place]; on admission his PLTs were 71,000; currently on IV heparin; consider hematology consultation 5.  Pneumonia - per primary medical team  Signed, EDMISTEN, BROOKE O. PA-C

## 2011-12-31 NOTE — Progress Notes (Signed)
ANTICOAGULATION CONSULT NOTE - Follow Up Consult  Pharmacy Consult for heparin/warfarin Indication: atrial fibrillation>>LAA clot  Labs:  Basename 12/31/11 0440 12/30/11 1050 12/30/11 0500 12/29/11 1150 12/29/11 0523 12/28/11 2204 12/28/11 1310  HGB -- -- 11.4* -- 11.7* -- --  HCT -- -- 35.0* -- 35.3* -- 38.1*  PLT -- -- 55* -- 48* -- 71*  APTT -- -- -- -- 88* -- --  LABPROT 22.0* -- 17.2* 16.8* -- -- --  INR 1.89* -- 1.38 1.34 -- -- --  HEPARINUNFRC 0.41 0.49 -- 0.39 -- -- --  CREATININE 1.24 -- 1.37* -- 1.24 -- --  CKTOTAL -- -- -- 73 67 66 --  CKMB -- -- -- 4.4* 3.8 3.7 --  TROPONINI -- -- -- <0.30 <0.30 <0.30 --    Assessment: 78yo male now at goal on heparin>>warfarin dosing for Afib. Heparin at goal, INR trending up but still subtherapeutic. No bleeding complications noted. Platelets still low but up 48>>55; no cbc today. Will continue at current rate and follow platelets trend.  LAA clot noted during examination yesterday. Cardioversion cancelled.  Goal of Therapy:  Heparin level 0.3-0.7 units/ml  INR goal 2-3  Plan:  Continue heparin at 1400 units/hr Warfarin 5mg  tonight  Sheppard Coil PharmD BCPS 12/31/2011,10:13 AM

## 2011-12-31 NOTE — Progress Notes (Signed)
[facility]  TEAM 1 - Stepdown/ICU TEAM  Subjective: Endorses breathing is better and less productive cough. Denies cp.  Denies shortness of breath at the present time. Son and wife at the bedside and were updated on chronic status by the cardiology team as well as by Dr. Sharon Seller.  Objective: Blood pressure 116/33, pulse 125, temperature 98 F (36.7 C), temperature source Oral, resp. rate 18, height 5\' 10"  (1.778 m), weight 95.7 kg (210 lb 15.7 oz), SpO2 96.00%.  Intake/Output from previous day: 04/30 0701 - 05/01 0700 In: 2419 [P.O.:1200; I.V.:819; IV Piggyback:400] Out: 4175 [Urine:4175] Intake/Output this shift: Total I/O In: 356 [I.V.:156; IV Piggyback:200] Out: 400 [Urine:400]  General appearance: alert, cooperative, appears stated age and no distress Resp: Lung sounds were clear, no wheezing and few basilar crackles, now on room air  Cardio: atrial fibrillation ventricular response has been between 69 and 120, S1, S2 normal, no murmur, click, rub or gallop, remains on IV heparin and continuous Cardizem infusion GI: soft, non-tender; bowel sounds normal; no masses,  no organomegaly Extremities: extremities normal, atraumatic, no cyanosis. Soft nonpitting bilateral lower extremity edema it is improving as compared to prior to admission Neurologic: Grossly normal  Lab Results:  Basename 12/30/11 0500 12/29/11 0523  WBC 13.1* 11.5*  HGB 11.4* 11.7*  HCT 35.0* 35.3*  PLT 55* 48*   BMET  Basename 12/31/11 0440 12/30/11 0500  NA 137 139  K 3.2* 3.9  CL 100 101  CO2 29 27  GLUCOSE 104* 115*  BUN 32* 35*  CREATININE 1.24 1.37*  CALCIUM 8.2* 8.7   Medications: I have reviewed the patient's current medications.  Assessment/Plan:  Atrial fibrillation with rapid ventricular response/New onset a-fib *Despite Cardizem infusion patient still has issues with episodic bursts of rapid ventricular response *May need to add an antiarrhythmic medication such as  amiodarone-defer to cardiology *Appreciate assistance of cardiology and electrophysiology  *TEE demonstrated left atrial appendage thrombus and requires 4-6 weeks of anticoagulation before can proceed with DCCV *Plans are for long-term chronic anticoagulation-patient prefers Pradaxa over Coumadin- currently pharmacy is managing heparin and Coumadin dosing   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 It is noted that the patient has had persistent tachycardia with rates between 110 and 140 for at least 2-3 weeks possibly longer and this is suggestive of reversible tachycardia induced cardiomyopathy *Continue Lasix 80 mg IV every 12 hours or as directed by cardiology *Likely onset after prolonged tachycardia i.e. tachycardia induced cardiomyopathy *Cardiac isoenzymes are negative so no indication for acute ischemic etiology *Suspect once patient rate and/or rhythm controlled and patient has had enough time for myocardial stunning to reverse will need followup 2-D echocardiogram.  *may require cardiac catheterization to rule out underlying ischemia despite current negative evaluation. Cardiology is considering this as an option during this admission  Community acquired pneumonia vs. Acute bronchitis *No focal infiltrate seen on x-ray *Continue empiric antibiotic coverage for community-acquired pneumonia *Continue mucolytic and nebulizer therapy-Atrovent; Xopenex  Thrombocytopenia *Improving and apparently was an issue prior to admission and has been followed by [facility]  HTN (hypertension), benign *Blood pressure well controlled  Hypokalemia Replete and follow - goal is to keep >4.0  Disposition *Remain in stepdown while on IV chronotropes   LOS: 3 days   Junious Silk,  ANP pager [phone]  [facility] 1 [email or web address] Password: [email or web address]  12/31/2011, 11:34  AM  I have personally examined this patient and reviewed the entire database. I have reviewed the above note, made any necessary editorial changes, and agree with its content.  Lonia Blood, MD [facility]

## 2011-12-31 NOTE — Progress Notes (Signed)
Utilization Review Completed.Jacqui Headen T5/09/2011   

## 2012-01-01 DIAGNOSIS — I10 Essential (primary) hypertension: Secondary | ICD-10-CM

## 2012-01-01 DIAGNOSIS — J189 Pneumonia, unspecified organism: Secondary | ICD-10-CM

## 2012-01-01 DIAGNOSIS — J159 Unspecified bacterial pneumonia: Secondary | ICD-10-CM

## 2012-01-01 DIAGNOSIS — I5021 Acute systolic (congestive) heart failure: Secondary | ICD-10-CM

## 2012-01-01 DIAGNOSIS — I4891 Unspecified atrial fibrillation: Secondary | ICD-10-CM

## 2012-01-01 DIAGNOSIS — D696 Thrombocytopenia, unspecified: Secondary | ICD-10-CM

## 2012-01-01 LAB — PROTIME-INR
INR: 2.09 — ABNORMAL HIGH (ref 0.00–1.49)
Prothrombin Time: 23.8 seconds — ABNORMAL HIGH (ref 11.6–15.2)

## 2012-01-01 LAB — BASIC METABOLIC PANEL
Chloride: 101 mEq/L (ref 96–112)
Creatinine, Ser: 1.19 mg/dL (ref 0.50–1.35)
GFR calc Af Amer: 66 mL/min — ABNORMAL LOW (ref >90)
GFR calc non Af Amer: 57 mL/min — ABNORMAL LOW (ref >90)

## 2012-01-01 LAB — HEPARIN LEVEL (UNFRACTIONATED): Heparin Unfractionated: 0.61 IU/mL (ref 0.30–0.70)

## 2012-01-01 LAB — CBC
Hemoglobin: 11 g/dL — ABNORMAL LOW (ref 13.0–17.0)
MCHC: 32.4 g/dL (ref 30.0–36.0)
Platelets: 47 10*3/uL — ABNORMAL LOW (ref 150–400)
RDW: 15.9 % — ABNORMAL HIGH (ref 11.5–15.5)

## 2012-01-01 MED ORDER — METOPROLOL TARTRATE 25 MG PO TABS
25.0000 mg | ORAL_TABLET | Freq: Four times a day (QID) | ORAL | Status: DC
  Administered 2012-01-01 (×2): 25 mg via ORAL
  Filled 2012-01-01 (×4): qty 1

## 2012-01-01 MED ORDER — POTASSIUM CHLORIDE CRYS ER 20 MEQ PO TBCR
40.0000 meq | EXTENDED_RELEASE_TABLET | Freq: Once | ORAL | Status: AC
  Administered 2012-01-01: 40 meq via ORAL
  Filled 2012-01-01: qty 2

## 2012-01-01 MED ORDER — METOPROLOL TARTRATE 25 MG PO TABS
25.0000 mg | ORAL_TABLET | Freq: Four times a day (QID) | ORAL | Status: DC
  Administered 2012-01-01 – 2012-01-02 (×4): 25 mg via ORAL
  Filled 2012-01-01 (×6): qty 1

## 2012-01-01 MED ORDER — WARFARIN SODIUM 5 MG PO TABS
5.0000 mg | ORAL_TABLET | Freq: Once | ORAL | Status: AC
  Administered 2012-01-01: 5 mg via ORAL
  Filled 2012-01-01: qty 1

## 2012-01-01 NOTE — Progress Notes (Signed)
[facility] [place] TEAM 1 - Stepdown/ICU TEAM  Subjective: Continues to improve. Still has cough but is less productive. Continues with dyspnea on exertion.  Objective: Blood pressure 96/67, pulse 79, temperature 98.1 F (36.7 C), temperature source Oral, resp. rate 18, height 5\' 10"  (1.778 m), weight 93.9 kg (207 lb 0.2 oz), SpO2 95.00%.  Intake/Output from previous day: 05/01 0701 - 05/02 0700 In: 2390 [P.O.:1260; I.V.:922; IV Piggyback:208] Out: 3025 [Urine:3025] Intake/Output this shift: Total I/O In: 71 [I.V.:71] Out: 800 [Urine:800]  General appearance: alert, cooperative, appears stated age and no distress Resp: Lung sounds were clear, no wheezing and few basilar crackles, now on room air  Cardio: atrial fibrillation ventricular response has been between 79 and 106, S1, S2 normal, no murmur, click, rub or gallop, remains on IV heparin and continuous Cardizem infusion GI: soft, non-tender; bowel sounds normal; no masses,  no organomegaly Extremities: extremities normal, atraumatic, no cyanosis. Soft nonpitting bilateral lower extremity edema it is improving as compared to prior to admission Neurologic: Grossly normal  Lab Results:  Basename 01/01/12 0515 12/30/11 0500  WBC 7.0 13.1*  HGB 11.0* 11.4*  HCT 34.0* 35.0*  PLT 47* 55*   BMET  Basename 01/01/12 0515 12/31/11 0440  NA 140 137  K 3.3* 3.2*  CL 101 100  CO2 30 29  GLUCOSE 133* 104*  BUN 29* 32*  CREATININE 1.19 1.24  CALCIUM 8.6 8.2*   Medications: I have reviewed the patient's current medications.  Assessment/Plan:  Atrial fibrillation with rapid ventricular response/New onset a-fib *Cardiology beginning Lopressor and attempting to wean IV Cardizem of *Appreciate assistance of cardiology and electrophysiology  *TEE demonstrated left atrial appendage thrombus and requires 4-6 weeks of anticoagulation before can proceed with DCCV. Because of the left atrial appendage can not chemically  convert to sinus rhythm either. 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 It is noted that the patient has had persistent tachycardia with rates between 110 and 140 for at least 2-3 weeks possibly longer and this is suggestive of reversible tachycardia induced cardiomyopathy *Continue Lasix 80 mg IV every 12 hours or as directed by cardiology who at this time plan at least an additional 24 hours of IV diuresis *Likely onset after prolonged tachycardia i.e. tachycardia induced cardiomyopathy *Cardiac isoenzymes are negative so no indication for acute ischemic etiology *Further cardiovascular risk stratification was clinically improved according to cardiology  Community acquired pneumonia vs. Acute bronchitis *No focal infiltrate seen on x-ray *Continue empiric antibiotic coverage for community-acquired pneumonia *Continue mucolytic and nebulizer therapy-Atrovent; Xopenex  Thrombocytopenia *Improving and apparently was an issue prior to admission and has been followed by [facility]  HTN (hypertension), benign *Blood pressure well controlled  Hypokalemia Replete and follow - goal is to keep >4.0  Disposition *Remain in stepdown while on IV chronotropes   LOS: 4 days   Junious Silk, ANP pager [phone]  [facility] [email or web address] Password: [email or web address]  01/01/2012, 1:45 PM  I have examined the patient and reviewed the chart. I agree with the above note.   Calvert Cantor, MD [phone]

## 2012-01-01 NOTE — Progress Notes (Signed)
Patient had a 5-beat run of v-tach while going to bathroom.  Patient was alert and remained asymptomatic.  MD on call text paged with event. Will continue to monitor.   awalker,rn

## 2012-01-01 NOTE — Progress Notes (Signed)
ANTICOAGULATION CONSULT NOTE - Follow Up Consult  Pharmacy Consult for heparin/warfarin Indication: atrial fibrillation>>LAA clot  Labs:  Basename 01/01/12 0515 12/31/11 0440 12/30/11 1050 12/30/11 0500  HGB 11.0* -- -- 11.4*  HCT 34.0* -- -- 35.0*  PLT 47* -- -- 55*  APTT -- -- -- --  LABPROT 23.8* 22.0* -- 17.2*  INR 2.09* 1.89* -- 1.38  HEPARINUNFRC 0.61 0.41 0.49 --  CREATININE 1.19 1.24 -- 1.37*  CKTOTAL -- -- -- --  CKMB -- -- -- --  TROPONINI -- -- -- --    Assessment: Afib now with new LAA clot. Heparin at goal -stopped last night, INR trending up at goal. No bleeding complications noted. Platelets still low 48>>55>47, hgb stable.   Goal of Therapy:   INR goal 2-3  Plan:  Warfarin 5mg  tonight  Sheppard Coil PharmD BCPS 01/01/2012,12:12 PM

## 2012-01-01 NOTE — Progress Notes (Signed)
Patient: Jimmy Myers Date of Encounter: 01/01/2012, 7:50 AM Admit date: 12/28/2011     Subjective  Jimmy Myers reports his shortness of breath continues to improve.  V rates are stable.  He denies chest pain or palpitations.   Objective   Filed Vitals:   01/01/12 0739  BP: 115/70  Pulse: 104  Temp: 98 F (36.7 C)  Resp: 21    Intake/Output Summary (Last 24 hours) at 01/01/12 0750 Last data filed at 01/01/12 0740  Gross per 24 hour  Intake   2357 ml  Output   3425 ml  Net  -1068 ml   Tele- afib, V rates 110  Physical Exam: General: Well developed, 78 year old male in no acute distress. Head: Normocephalic, atraumatic, sclera non-icteric, no xanthomas, nares are without discharge.  Neck: Supple. JVP 10cm Lungs: Coarse breath sounds on right with end expiratory wheezes bilaterally. Breathing is unlabored. Heart: Irregularly irregular. S1 S2 without murmurs, rubs, or gallops.  Abdomen: Soft, non-tender, non-distended with normoactive bowel sounds.  Msk:  Strength and tone appear normal for age. Extremities: No clubbing or cyanosis. No edema.  Distal pedal pulses are 2+ and equal bilaterally. Neuro: Alert and oriented X 3. Moves all extremities spontaneously. Psych:  Responds to questions appropriately with a normal affect.  Inpatient Medications:   . aspirin EC  81 mg Oral Daily  . azithromycin  500 mg Intravenous Q24H  . cefTRIAXone (ROCEPHIN)  IV  1 g Intravenous Q24H  . docusate sodium  100 mg Oral BID  . furosemide  80 mg Intravenous Q12H  . guaiFENesin  1,200 mg Oral BID  . ipratropium  0.5 mg Nebulization QID  . levalbuterol  0.63 mg Nebulization QID  . pantoprazole  40 mg Oral Q1200  . potassium chloride  10 mEq Intravenous Q1 Hr x 4  . potassium chloride  20 mEq Oral BID  . potassium chloride  40 mEq Oral Once  . sodium chloride  3 mL Intravenous Q12H  . warfarin  7.5 mg Oral ONCE-1800  . Warfarin - Pharmacist Dosing Inpatient   Does not apply q1800   .  diltiazem (CARDIZEM) infusion 15 mg/hr (12/31/11 0215)  . heparin 1,400 Units/hr (12/30/11 2151)    Labs:  [facility] 12/31/11 0440 12/30/11 0500  NA 137 139  K 3.2* 3.9  CL 100 101  CO2 29 27  GLUCOSE 104* 115*  BUN 32* 35*  CREATININE 1.24 1.37*  CALCIUM 8.2* 8.7  MG -- --  PHOS -- --   No results found for this basename: AST:2,ALT:2,ALKPHOS:2,BILITOT:2,PROT:2,ALBUMIN:2 in the last 72 hours  Basename 01/01/12 0515 12/30/11 0500  WBC 7.0 13.1*  NEUTROABS -- --  HGB 11.0* 11.4*  HCT 34.0* 35.0*  MCV 95.5 95.9  PLT 47* 55*    Basename 12/29/11 1150  CKTOTAL 73  CKMB 4.4*  TROPONINI <0.30   Radiology: no CXR in last 24-48 hours  Transesophageal Echocardiogram 12/30/2011:  - Left ventricle: The cavity size was dilated. Systolic function was severely reduced. The estimated ejection fraction was in the range of 20% to 25%. Diffuse hypokinesis. - Aortic valve: Mild regurgitation. - Aortic root: The aortic root was moderately dilated. - Mitral valve: Moderate regurgitation. - Left atrium: The atrium was moderately dilated. Left atrial appendage with severe spontaneous contrast and thrombus noted at tip. Emptying velocity was reduced. - Right ventricle: The cavity size was mildly dilated. Systolic function was moderately reduced. - Right atrium: The atrium was mildly dilated.  Assessment and Plan  1.  Atrial fibrillation with elevated V rates Due to LAA thrombus, we will have to rate control.  We cannot cardiovert electrically or chemically at this time.  He is therefore not a candidate for antiarrhythmic drugs. I have spoken with pts primary care that the [place] who states that the [place] will not approve pradaxa for him.  We will therefore continue coumadin.  Stop heparin gtt as INR >2.  Will transition to PO metoprolol today and try to wean off of cardizem gtt  2.  New LV dysfunction with acute systolic CHF - ? tachycardia mediated;  Rate control afib as above 1 more  day of IV diuresis.  Convert to PO lasix tomorrow. Will consider further CV risk stratification once clinically improved  3.  LAA clot - continue warfarin  4.  Thrombocytopenia - ? etiology; patient reports low platelets x 1 year, followed by the [place], per primary team   5.  Pneumonia - clinically improving, per primary medical team  PT consult Can transfer to telemetry once heart rates are stable off of cardizem gtt (hopefully later today)   Jimmy Fearing Pammy Vesey,MD 7:55 AM. Td

## 2012-01-02 DIAGNOSIS — I5021 Acute systolic (congestive) heart failure: Secondary | ICD-10-CM

## 2012-01-02 DIAGNOSIS — I519 Heart disease, unspecified: Secondary | ICD-10-CM

## 2012-01-02 DIAGNOSIS — I10 Essential (primary) hypertension: Secondary | ICD-10-CM

## 2012-01-02 DIAGNOSIS — I4891 Unspecified atrial fibrillation: Secondary | ICD-10-CM

## 2012-01-02 DIAGNOSIS — J159 Unspecified bacterial pneumonia: Secondary | ICD-10-CM

## 2012-01-02 LAB — BASIC METABOLIC PANEL
BUN: 29 mg/dL — ABNORMAL HIGH (ref 6–23)
Calcium: 8.8 mg/dL (ref 8.4–10.5)
Chloride: 101 mEq/L (ref 96–112)
Creatinine, Ser: 1.3 mg/dL (ref 0.50–1.35)
GFR calc Af Amer: 59 mL/min — ABNORMAL LOW (ref >90)
GFR calc non Af Amer: 51 mL/min — ABNORMAL LOW (ref >90)

## 2012-01-02 LAB — CBC
MCHC: 32.6 g/dL (ref 30.0–36.0)
Platelets: 59 10*3/uL — ABNORMAL LOW (ref 150–400)
RDW: 15.7 % — ABNORMAL HIGH (ref 11.5–15.5)
WBC: 8 10*3/uL (ref 4.0–10.5)

## 2012-01-02 LAB — PROTIME-INR
INR: 2.76 — ABNORMAL HIGH (ref 0.00–1.49)
Prothrombin Time: 29.6 seconds — ABNORMAL HIGH (ref 11.6–15.2)

## 2012-01-02 MED ORDER — METOPROLOL TARTRATE 50 MG PO TABS
50.0000 mg | ORAL_TABLET | Freq: Four times a day (QID) | ORAL | Status: DC
Start: 2012-01-02 — End: 2012-01-03
  Administered 2012-01-02 – 2012-01-03 (×3): 50 mg via ORAL
  Filled 2012-01-02 (×6): qty 1

## 2012-01-02 MED ORDER — FUROSEMIDE 80 MG PO TABS
80.0000 mg | ORAL_TABLET | Freq: Every day | ORAL | Status: DC
Start: 2012-01-03 — End: 2012-01-05
  Administered 2012-01-03 – 2012-01-05 (×3): 80 mg via ORAL
  Filled 2012-01-02 (×3): qty 1

## 2012-01-02 MED ORDER — WARFARIN SODIUM 2.5 MG PO TABS
2.5000 mg | ORAL_TABLET | Freq: Once | ORAL | Status: AC
  Administered 2012-01-02: 2.5 mg via ORAL
  Filled 2012-01-02: qty 1

## 2012-01-02 NOTE — Progress Notes (Signed)
Pt asked for container to place Lt. Upper tooth that fell off.  No bleeding noted.  Dr. Johney Frame in to see pt at this time.  awre of it.  Pt put tooth in dental cup and put [employer].  Will cont. To monitor.  Amanda Pea, RN

## 2012-01-02 NOTE — Progress Notes (Signed)
Patient: Jimmy Myers Date of Encounter: 01/02/2012, 5:06 PM Admit date: 12/28/2011   Pleasant 78 yo WM admitted with pneumonia.  Found to have afib with RVR and newly documented EF 20-25%.  LAA thrombus on TEE and therefore rate control is planned for afib.     Subjective  [name] reports his shortness of breath continues to improve.  He is now ambulating without obvious dypsnea which is an improvement.  He denies chest pain or palpitations.   Objective   Filed Vitals:   01/02/12 1625  BP: 114/78  Pulse: 105  Temp:   Resp:     Intake/Output Summary (Last 24 hours) at 01/02/12 1706 Last data filed at 01/02/12 1300  Gross per 24 hour  Intake    943 ml  Output   3475 ml  Net  -2532 ml   Tele- afib, V rates 110  Physical Exam: General: Well developed, 78 year old male in no acute distress. Head: Normocephalic, atraumatic, sclera non-icteric, no xanthomas, nares are without discharge.  Neck: Supple. JVP 8cm Lungs: few wheezes, improved. Breathing is unlabored. Heart: Irregularly irregular. S1 S2 without murmurs, rubs, or gallops.  Abdomen: Soft, non-tender, non-distended with normoactive bowel sounds.  Msk:  Strength and tone appear normal for age. Extremities: No clubbing or cyanosis. +1 dependant edema.  Distal pedal pulses are 2+ and equal bilaterally. Neuro: Alert and oriented X 3. Moves all extremities spontaneously. Psych:  Responds to questions appropriately with a normal affect.  Inpatient Medications:   . aspirin EC  81 mg Oral Daily  . azithromycin  500 mg Intravenous Q24H  . cefTRIAXone (ROCEPHIN)  IV  1 g Intravenous Q24H  . docusate sodium  100 mg Oral BID  . furosemide  80 mg Intravenous Q12H  . guaiFENesin  1,200 mg Oral BID  . ipratropium  0.5 mg Nebulization QID  . levalbuterol  0.63 mg Nebulization QID  . pantoprazole  40 mg Oral Q1200  . potassium chloride  10 mEq Intravenous Q1 Hr x 4  . potassium chloride  20 mEq Oral BID  . potassium  chloride  40 mEq Oral Once  . sodium chloride  3 mL Intravenous Q12H  . warfarin  7.5 mg Oral ONCE-1800  . Warfarin - Pharmacist Dosing Inpatient   Does not apply q1800   . diltiazem (CARDIZEM) infusion 15 mg/hr (12/31/11 0215)  . heparin 1,400 Units/hr (12/30/11 2151)    Labs:  No results found for this basename: AST:2,ALT:2,ALKPHOS:2,BILITOT:2,PROT:2,ALBUMIN:2 in the last 72 hours  Basename 01/02/12 0546 01/01/12 0515  WBC 8.0 7.0  NEUTROABS -- --  HGB 11.6* 11.0*  HCT 35.6* 34.0*  MCV 96.0 95.5  PLT 59* 47*   No results found for this basename: CKTOTAL:4,CKMB:4,TROPONINI:4 in the last 72 hours Radiology: no CXR in last 24-48 hours  Transesophageal Echocardiogram 12/30/2011:  - Left ventricle: The cavity size was dilated. Systolic function was severely reduced. The estimated ejection fraction was in the range of 20% to 25%. Diffuse hypokinesis. - Aortic valve: Mild regurgitation. - Aortic root: The aortic root was moderately dilated. - Mitral valve: Moderate regurgitation. - Left atrium: The atrium was moderately dilated. Left atrial appendage with severe spontaneous contrast and thrombus noted at tip. Emptying velocity was reduced. - Right ventricle: The cavity size was mildly dilated. Systolic function was moderately reduced. - Right atrium: The atrium was mildly dilated.     Assessment and Plan  1.  Atrial fibrillation with elevated V rates Due to  LAA thrombus, we will have to rate control.  We cannot cardiovert electrically or chemically at this time.  He is therefore not a candidate for antiarrhythmic drugs. I have spoken with pts primary care that the [place] who states that the [place] will not approve pradaxa for him.  We will therefore continue coumadin.   Increase metoprolol to 50mg  Q6 hours at this time.  2.  New LV dysfunction with acute systolic CHF - ? tachycardia mediated;  Rate control afib as above Convert to PO lasix today Will consider further CV risk  stratification once clinically improved (as an outpatient)  3.  LAA clot - continue warfarin, repeat TEE in 6 weeks.  4.  Thrombocytopenia - ? etiology; patient reports low platelets x 1 year, followed by the [place], per primary team   5.  Pneumonia - clinically improving, per primary medical team  PT following I anticipate that he will be here over the weekend.  Possibly going home on Sunday. At discharge, would follow-up with Norma Fredrickson in 2 weeks.  I will see in 6 weeks.    Mical Kicklighter,MD 5:06 PM.

## 2012-01-02 NOTE — Evaluation (Signed)
Physical Therapy Evaluation Patient Details Name: Jimmy Myers MRN: [id number] DOB: [birth year] Today's Date: 01/02/2012 Time: 6962-9528 PT Time Calculation (min): 31 min  PT Assessment / Plan / Recommendation Clinical Impression  Pt is a 78 y/o retired male who live with his healthy spouse.  Pt admitted for SOB and  Atrial fibrillation with rapid ventricular response/New onset a-fib .       PT Assessment  Patient needs continued PT services    Follow Up Recommendations  Other (comment) (cardiac rehab )    Equipment Recommendations  None recommended by PT    Frequency Min 2X/week    Precautions / Restrictions Precautions Precautions: Fall Precaution Comments: Pt HR in 116 with minimal activity.  Restrictions Weight Bearing Restrictions: No   Pertinent Vitals/Pain No c/o pain  HR 92 at rest increased to 116 during first ten feet of ambulating, 106 at end of session.  No SOB or chest pain SpO2 >90 throughout session on room air.       Mobility  Bed Mobility Bed Mobility: Supine to Sit;Sit to Supine Supine to Sit: 7: Independent;HOB flat Sit to Supine: 7: Independent;HOB flat Transfers Transfers: Sit to Stand;Stand to Sit Sit to Stand: 5: Supervision;7: Independent;With upper extremity assist;From bed;From chair/3-in-1 Stand to Sit: 5: Supervision;7: Independent;To bed;To chair/3-in-1;With upper extremity assist Details for Transfer Assistance: supervision for safety only.  Ambulation/Gait Ambulation/Gait Assistance: 5: Supervision Ambulation Distance (Feet): 150 Feet Assistive device: None Ambulation/Gait Assistance Details: pt's HR increased from low 90s to 116 in first 10 feet of ambulating. HR improved with activity leveling off at 104.  No gait abnormality noted. Pt reported LEs feel weak.   Gait Pattern: Within Functional Limits Gait velocity: slow Stairs: No Wheelchair Mobility Wheelchair Mobility: No    Exercises     PT Goals Acute Rehab PT Goals PT Goal  Formulation: With patient Time For Goal Achievement: 01/09/12 Potential to Achieve Goals: Good Pt will Ambulate: >150 feet;Independently;with gait velocity >(comment) ft/second (gait velocity WFL and HR no more than 50% or HR max.  ) PT Goal: Ambulate - Progress: Goal set today  Visit Information  Last PT Received On: 01/02/12    Subjective Data  Subjective: I feel weak. I used to walk 45 min a day for exercise before my ankle started to swell up a few weeks ago.   Patient Stated Goal: improve strength. Be able to walk for exercise   Prior Functioning  Home Living Lives With: Spouse Available Help at Discharge: Family;Available 24 hours/day Type of Home: House Home Access: Stairs to enter [employer] of Steps: 4 Entrance Stairs-Rails: Left Home Layout: One level Bathroom Shower/Tub: Tub/shower unit;Door [employer] Toilet: Handicapped height Bathroom Accessibility: Yes How Accessible: Accessible via walker Home Adaptive Equipment: Shower chair with back;Walker - rolling Prior Function Level of Independence: Independent Able to Take Stairs?: Yes Driving: Yes Vocation: Retired Musician: No difficulties Dominant Hand: Right    Cognition  Overall Cognitive Status: Appears within functional limits for tasks assessed/performed Arousal/Alertness: Awake/alert Orientation Level: Appears intact for tasks assessed;Oriented X4 / Intact Behavior During Session: [facility] for tasks performed    Extremity/Trunk Assessment Right Upper Extremity Assessment RUE ROM/Strength/Tone: Within functional levels Left Upper Extremity Assessment LUE ROM/Strength/Tone: Within functional levels Right Lower Extremity Assessment RLE ROM/Strength/Tone: Within functional levels Left Lower Extremity Assessment LLE ROM/Strength/Tone: Within functional levels Trunk Assessment Trunk Assessment: Normal   Balance Balance Balance Assessed: No  End of Session PT - End of  Session Equipment Utilized During Treatment:  Gait belt Activity Tolerance: Patient tolerated treatment well Patient left: in bed;with call bell/phone within reach;with bed alarm set Nurse Communication: Mobility status   Alexarae Oliva 01/02/2012, 2:37 PM Shanieka Blea L. Kairon Shock DPT [phone]

## 2012-01-02 NOTE — Care Management Note (Signed)
    Page 1 of 2   01/05/2012     4:43:41 PM   CARE MANAGEMENT NOTE 01/05/2012  Patient:  Jimmy Myers, Jimmy Myers   Account Number:  [id number]  Date Initiated:  12/30/2011  Documentation initiated by:  Fransico Michael  Subjective/Objective Assessment:   admitted on 12/28/11 with c/o shortness of breath and productive cough.     Action/Plan:   cardiac w/u for CHF  IV Abx for pna  cardizem/heparin drips   Anticipated DC Date:  01/02/2012   Anticipated DC Plan:  HOME W HOME HEALTH SERVICES      DC Planning Services  CM consult      Choice offered to / List presented to:             Status of service:  Completed, signed off Medicare Important Message given?   (If response is "NO", the following Medicare IM given date fields will be blank) Date Medicare IM given:   Date Additional Medicare IM given:    Discharge Disposition:  HOME/SELF CARE  Per UR Regulation:  Reviewed for med. necessity/level of care/duration of stay  If discussed at Long Length of Stay Meetings, dates discussed:    Comments:  PCP: Jimmy Myers  ContactBRAN, ALDRIDGE (spouse) [phone]  01/05/12 Onnie Boer, RN, BSN 1641 PT IS TO DC TO HOME WITH SELF CARE.  THN IS GOING TO FOLLOW PT AT DC.  PT WILL F/U FOR PT/ INR ON TOMORROW 01/05/12 AT THE [facility] AT 10 AM .  DETAILS GIVEN TO PT AND WIFE AND WRITTEN IN DC SUM.  01/02/12 Onnie Boer, RN, BSN 1629 PT HAS VA BENEFITS AND GETS MEDS THROUGH THE [place].  IF PT IS TO DC OVER THE WEEKEND PT CAN GET SCRIPTS FILLED AT [facility].  IF PT DCD' DURING THE WEEK, SCRIPTS NEED TO BE FAXED IN TO [facility].  PLEASE CALL HIS DESK AT [phone] EXT 1500, IT IS UP TO THE VA TO AGREE TO PAY FOR THE MEDS, IF THEY REFUSE PT WIL HAVE TO GET A SMALL SUPPLY UNTIL HE CAN F/U AT THE [facility].  WILL F/U.  12/30/11-1412-J.Lutricia Horsfall [phone]      78yo male patient admitted on 12/28/11 with c/o shortness of breath and productive cough. Diagnosed with acute  exacerbation of CHF and Atrial fib with RVR. Prior to admission, patient lived at home with spouse. Independent with ADLs.

## 2012-01-02 NOTE — Progress Notes (Signed)
Subjective: Patient seen and examined ,C/O cough productive of yellowish sputum.noted to have NSVT last night ,asymptomatic.  Objective: Vital signs in last 24 hours: Temp:  [97.9 F (36.6 C)-99.2 F (37.3 C)] 99.1 F (37.3 C) (05/03 0700) Pulse Rate:  [79-104] 104  (05/03 0700) Resp:  [18-20] 18  (05/03 0700) BP: (96-122)/(66-77) 122/74 mmHg (05/03 0700) SpO2:  [92 %-97 %] 94 % (05/03 0700) Weight:  [92.2 kg (203 lb 4.2 oz)] 92.2 kg (203 lb 4.2 oz) (05/03 0700) Weight change: -1.7 kg (-3 lb 12 oz) Last BM Date: 01/01/12  Intake/Output from previous day: 05/02 0701 - 05/03 0700 In: 644 [P.O.:460; I.V.:184] Out: 2750 [Urine:2750] Total I/O In: -  Out: 550 [Urine:550]   Physical Exam: General: Alert, awake, oriented x3, in no acute distress. HEENT: No bruits, no goiter. Heart: irregular irregular  rhythm, without murmurs, rubs, gallops. Lungs: Clear to auscultation bilaterally. Abdomen: Soft, nontender, nondistended, positive bowel sounds. Extremities: No clubbing cyanosis or edema with positive pedal pulses. Neuro: Grossly intact, nonfocal.    Lab Results: Results for orders placed during the hospital encounter of 12/28/11 (from the past 24 hour(s))  PROTIME-INR     Status: Abnormal   Collection Time   01/02/12  5:46 AM      Component Value Range   Prothrombin Time 29.6 (*) 11.6 - 15.2 (seconds)   INR 2.76 (*) 0.00 - 1.49   BASIC METABOLIC PANEL     Status: Abnormal   Collection Time   01/02/12  5:46 AM      Component Value Range   Sodium 140  135 - 145 (mEq/L)   Potassium 3.7  3.5 - 5.1 (mEq/L)   Chloride 101  96 - 112 (mEq/L)   CO2 32  19 - 32 (mEq/L)   Glucose, Bld 104 (*) 70 - 99 (mg/dL)   BUN 29 (*) 6 - 23 (mg/dL)   Creatinine, Ser 1.61  0.50 - 1.35 (mg/dL)   Calcium 8.8  8.4 - 09.6 (mg/dL)   GFR calc non Af Amer 51 (*) >90 (mL/min)   GFR calc Af Amer 59 (*) >90 (mL/min)  CBC     Status: Abnormal   Collection Time   01/02/12  5:46 AM      Component Value  Range   WBC 8.0  4.0 - 10.5 (K/uL)   RBC 3.71 (*) 4.22 - 5.81 (MIL/uL)   Hemoglobin 11.6 (*) 13.0 - 17.0 (g/dL)   HCT 04.5 (*) 40.9 - 52.0 (%)   MCV 96.0  78.0 - 100.0 (fL)   MCH 31.3  26.0 - 34.0 (pg)   MCHC 32.6  30.0 - 36.0 (g/dL)   RDW 81.1 (*) 91.4 - 15.5 (%)   Platelets 59 (*) 150 - 400 (K/uL)    Studies/Results: No results found.  Medications:    . aspirin EC  81 mg Oral Daily  . cefTRIAXone (ROCEPHIN)  IV  1 g Intravenous Q24H  . docusate sodium  100 mg Oral BID  . furosemide  80 mg Intravenous Q12H  . guaiFENesin  1,200 mg Oral BID  . ipratropium  0.5 mg Nebulization QID  . levalbuterol  0.63 mg Nebulization QID  . metoprolol tartrate  25 mg Oral Q6H  . pantoprazole  40 mg Oral Q1200  . potassium chloride  40 mEq Oral BID  . sodium chloride  3 mL Intravenous Q12H  . warfarin  5 mg Oral ONCE-1800  . warfarin   Does not apply Once  . Warfarin -  Pharmacist Dosing Inpatient   Does not apply q1800  . DISCONTD: metoprolol tartrate  25 mg Oral Q6H    sodium chloride, acetaminophen, acetaminophen, clorazepate, levalbuterol, morphine, nitroGLYCERIN, ondansetron (ZOFRAN) IV, ondansetron, sodium chloride, zolpidem     . DISCONTD: diltiazem (CARDIZEM) infusion Stopped (01/01/12 1500)    Assessment/Plan:  Atrial fibrillation with rapid ventricular response/New onset a-fib  *rate better controlled ,off IV cardizem  And on metoprolol po  *TEE demonstrated left atrial appendage thrombus and requires 4-6 weeks of anticoagulation before can proceed with DCCV. Because of the left atrial appendage can not chemically convert to sinus rhythm either.  *Plans are for long-term chronic anticoagulation- continue Coumadin as per pharmacy dosing.INR is therapeutic at 2.7. Acute systolic dysfunction secondary to tachycardia induced cardiomyopathy  *2-D echocardiogram demonstrates severe systolic dysfunction with estimated ejection fraction between 20 and 25% with diffuse hypokinesis  .Felt to be due to reversible tachycardia induced cardiomyopathy  *Continue Lasix 80 mg IV every 12 hours . *Cardiac isoenzymes are negative so no indication for acute ischemic etiology  Community acquired pneumonia vs. Acute bronchitis  *No focal infiltrate seen on x-ray  *Continue empiric antibiotic coverage for community-acquired pneumonia  *Continue mucolytic and nebulizer therapy-Atrovent; Xopenex  Thrombocytopenia  *Improving and apparently was an issue prior to admission and has been followed [facility]  HTN (hypertension), benign  *Blood pressure well controlled  Hypokalemia  Replete and follow - goal is to keep >4.0  Disposition  *Will request PT consult ,D/C when stable,and when cleared by cardiology.      LOS: 5 days   Adryan Shin 01/02/2012, 8:21 AM

## 2012-01-02 NOTE — Progress Notes (Signed)
Patient and wife received [facility] Care Management overview at the bedside on 5.2.13.  Patient has agreed to accept services for RN Care Coordinator support.  For any additional questions or new referrals please contact Anibal Henderson BSN RN [facility] Liaison at [phone].

## 2012-01-02 NOTE — Progress Notes (Signed)
ANTICOAGULATION CONSULT NOTE - Follow Up Consult  Pharmacy Consult for warfarin Indication: atrial fibrillation>>LAA clot  Labs:  Basename 01/02/12 0546 01/01/12 0515 12/31/11 0440 12/30/11 1050  HGB 11.6* 11.0* -- --  HCT 35.6* 34.0* -- --  PLT 59* 47* -- --  APTT -- -- -- --  LABPROT 29.6* 23.8* 22.0* --  INR 2.76* 2.09* 1.89* --  HEPARINUNFRC -- 0.61 0.41 0.49  CREATININE 1.30 1.19 1.24 --  CKTOTAL -- -- -- --  CKMB -- -- -- --  TROPONINI -- -- -- --    Assessment: Afib now with new LAA clot. INR trending up at goal. No bleeding complications noted. Platelets still low at 59 and this is chronic per patient, hgb stable. Home coumadin dose noted as 5mg /day and noted INR trend 1.89>>2.09>>2.76  Goal of Therapy:   INR goal 2-3  Plan:  -Coumadin 2.5mg  po tonight due to INR trend up  Harland German, Pharm D 01/02/2012 9:49 AM

## 2012-01-03 DIAGNOSIS — I10 Essential (primary) hypertension: Secondary | ICD-10-CM

## 2012-01-03 DIAGNOSIS — J159 Unspecified bacterial pneumonia: Secondary | ICD-10-CM

## 2012-01-03 DIAGNOSIS — I4891 Unspecified atrial fibrillation: Secondary | ICD-10-CM

## 2012-01-03 DIAGNOSIS — I5021 Acute systolic (congestive) heart failure: Secondary | ICD-10-CM

## 2012-01-03 LAB — CULTURE, BLOOD (ROUTINE X 2)
Culture  Setup Time: 201304282118
Culture  Setup Time: 201304282118

## 2012-01-03 LAB — BASIC METABOLIC PANEL
CO2: 27 mEq/L (ref 19–32)
GFR calc non Af Amer: 59 mL/min — ABNORMAL LOW (ref >90)
Glucose, Bld: 105 mg/dL — ABNORMAL HIGH (ref 70–99)
Potassium: 4 mEq/L (ref 3.5–5.1)
Sodium: 139 mEq/L (ref 135–145)

## 2012-01-03 LAB — PROTIME-INR
INR: 2.46 — ABNORMAL HIGH (ref 0.00–1.49)
Prothrombin Time: 27.1 seconds — ABNORMAL HIGH (ref 11.6–15.2)

## 2012-01-03 LAB — CBC
Hemoglobin: 11.7 g/dL — ABNORMAL LOW (ref 13.0–17.0)
RBC: 3.74 MIL/uL — ABNORMAL LOW (ref 4.22–5.81)
WBC: 7.2 10*3/uL (ref 4.0–10.5)

## 2012-01-03 MED ORDER — METOPROLOL TARTRATE 100 MG PO TABS
100.0000 mg | ORAL_TABLET | Freq: Two times a day (BID) | ORAL | Status: DC
  Administered 2012-01-03 – 2012-01-05 (×4): 100 mg via ORAL
  Filled 2012-01-03 (×5): qty 1

## 2012-01-03 MED ORDER — NYSTATIN 100000 UNIT/ML MT SUSP
5.0000 mL | Freq: Four times a day (QID) | OROMUCOSAL | Status: DC
  Administered 2012-01-03 – 2012-01-05 (×10): 500000 [IU] via ORAL
  Filled 2012-01-03 (×12): qty 5

## 2012-01-03 MED ORDER — WARFARIN SODIUM 5 MG PO TABS
5.0000 mg | ORAL_TABLET | Freq: Once | ORAL | Status: AC
  Administered 2012-01-03: 5 mg via ORAL
  Filled 2012-01-03: qty 1

## 2012-01-03 MED ORDER — POLYETHYLENE GLYCOL 3350 17 G PO PACK
17.0000 g | PACK | Freq: Every day | ORAL | Status: DC
  Administered 2012-01-03 – 2012-01-05 (×3): 17 g via ORAL
  Filled 2012-01-03 (×3): qty 1

## 2012-01-03 MED ORDER — NYSTATIN NICU ORAL SYRINGE 100,000 UNITS/ML: 1.0000 mL | Freq: Four times a day (QID) | OROMUCOSAL | Status: DC

## 2012-01-03 NOTE — Progress Notes (Signed)
Subjective: Patient seen and examined, complaining of shortness of breath with exertion  Objective: Vital signs in last 24 hours: Temp:  [98 F (36.7 C)-98.4 F (36.9 C)] 98 F (36.7 C) (05/04 0553) Pulse Rate:  [70-105] 93  (05/04 1015) Resp:  [18-20] 20  (05/04 1015) BP: (100-114)/(58-78) 107/63 mmHg (05/04 1015) SpO2:  [97 %-100 %] 98 % (05/04 0843) Weight:  [92 kg (202 lb 13.2 oz)] 92 kg (202 lb 13.2 oz) (05/04 0553) Weight change: -0.2 kg (-7.1 oz) Last BM Date: 01/03/12  Intake/Output from previous day: 05/03 0701 - 05/04 0700 In: 1080 [P.O.:1080] Out: 2475 [Urine:2475] Total I/O In: 220 [P.O.:220] Out: 200 [Urine:200]   Physical Exam: General: Alert, awake, oriented x3, in no acute distress.  HEENT: No bruits, no goiter.  Heart: irregular irregular rhythm, without murmurs, rubs, gallops.  Lungs: Clear to auscultation bilaterally.  Abdomen: Soft, nontender, nondistended, positive bowel sounds.  Extremities: No clubbing cyanosis or edema with positive pedal pulses.  Neuro: Grossly intact, nonfocal.     Lab Results: Results for orders placed during the hospital encounter of 12/28/11 (from the past 24 hour(s))  PROTIME-INR     Status: Abnormal   Collection Time   01/03/12  5:30 AM      Component Value Range   Prothrombin Time 27.1 (*) 11.6 - 15.2 (seconds)   INR 2.46 (*) 0.00 - 1.49   CBC     Status: Abnormal   Collection Time   01/03/12  5:30 AM      Component Value Range   WBC 7.2  4.0 - 10.5 (K/uL)   RBC 3.74 (*) 4.22 - 5.81 (MIL/uL)   Hemoglobin 11.7 (*) 13.0 - 17.0 (g/dL)   HCT 08.6 (*) 57.8 - 52.0 (%)   MCV 96.8  78.0 - 100.0 (fL)   MCH 31.3  26.0 - 34.0 (pg)   MCHC 32.3  30.0 - 36.0 (g/dL)   RDW 46.9 (*) 62.9 - 15.5 (%)   Platelets 67 (*) 150 - 400 (K/uL)  BASIC METABOLIC PANEL     Status: Abnormal   Collection Time   01/03/12  5:30 AM      Component Value Range   Sodium 139  135 - 145 (mEq/L)   Potassium 4.0  3.5 - 5.1 (mEq/L)   Chloride 102  96 -  112 (mEq/L)   CO2 27  19 - 32 (mEq/L)   Glucose, Bld 105 (*) 70 - 99 (mg/dL)   BUN 36 (*) 6 - 23 (mg/dL)   Creatinine, Ser 5.28  0.50 - 1.35 (mg/dL)   Calcium 8.7  8.4 - 41.3 (mg/dL)   GFR calc non Af Amer 59 (*) >90 (mL/min)   GFR calc Af Amer 68 (*) >90 (mL/min)    Studies/Results: No results found.  Medications:    . cefTRIAXone (ROCEPHIN)  IV  1 g Intravenous Q24H  . docusate sodium  100 mg Oral BID  . furosemide  80 mg Oral Daily  . guaiFENesin  1,200 mg Oral BID  . ipratropium  0.5 mg Nebulization QID  . levalbuterol  0.63 mg Nebulization QID  . metoprolol tartrate  100 mg Oral BID  . pantoprazole  40 mg Oral Q1200  . potassium chloride  40 mEq Oral BID  . sodium chloride  3 mL Intravenous Q12H  . warfarin  2.5 mg Oral ONCE-1800  . warfarin   Does not apply Once  . Warfarin - Pharmacist Dosing Inpatient   Does not apply q1800  .  DISCONTD: aspirin EC  81 mg Oral Daily  . DISCONTD: furosemide  80 mg Intravenous Q12H  . DISCONTD: metoprolol tartrate  50 mg Oral Q6H  . DISCONTD: metoprolol tartrate  25 mg Oral Q6H    sodium chloride, acetaminophen, acetaminophen, clorazepate, levalbuterol, morphine, nitroGLYCERIN, ondansetron (ZOFRAN) IV, ondansetron, sodium chloride, zolpidem     Assessment/Plan: Atrial fibrillation with rapid ventricular response/New onset a-fib  *rate better controlled ,off IV cardizem and on metoprolol po  *TEE demonstrated left atrial appendage thrombus , no candidate for electric or chemical cardioversion at this time *Plans are for long-term chronic anticoagulation- continue Coumadin as per pharmacy dosing.INR is therapeutic at 2.4.  Acute systolic dysfunction secondary to tachycardia induced cardiomyopathy  *2-D echocardiogram demonstrates severe systolic dysfunction with estimated ejection fraction between 20 and 25% with diffuse hypokinesis .Felt to be due to reversible tachycardia induced cardiomyopathy  *Continue Lasix 80 mg by mouth daily  .  *Cardiac isoenzymes are negative so no indication for acute ischemic etiology  Community acquired pneumonia vs. Acute bronchitis  *No focal infiltrate seen on x-ray  *Continue empiric antibiotic coverage for community-acquired pneumonia  *Continue mucolytic and nebulizer therapy-Atrovent; Xopenex  Thrombocytopenia  *Improving and apparently chronic has been followed by [facility]  HTN (hypertension), benign  *Blood pressure well controlled  Hypokalemia  Replete when necessary and follow - goal is to keep >4.0  Disposition  *Seen by PT recommended cardiac rehabilitation consult , would probably discharge in one to 2 days with home health PT and RN.     LOS: 6 days   Hajra Port 01/03/2012, 11:55 AM

## 2012-01-03 NOTE — Progress Notes (Signed)
ANTICOAGULATION CONSULT NOTE - Follow Up Consult  Pharmacy Consult for warfarin Indication: atrial fibrillation>>LAA clot  Assessment: 78 yo male on Coumadin PTA for Afib, now with new LAA clot. INR remains therapeutic at 2.46, no bleeding complications reported. Platelets still low at 67, but this is chronic per patient. [facility] will not [facility] Prad[facility]a.  Home coumadin dose: 5mg  daily  Goal of Therapy:  INR goal 2-3  Plan:  -Coumadin 5mg  PO x1 today. -Check INR in AM.  Jimmy Myers. Jimmy Myers, PharmD Pager: [phone] 01/03/2012 2:09 PM  Labs:  Jimmy Myers 01/03/12 0530 01/02/12 0546 01/01/12 0515  HGB 11.7* 11.6* --  HCT 36.2* 35.6* 34.0*  PLT 67* 59* 47*  APTT -- -- --  LABPROT 27.1* 29.6* 23.8*  INR 2.46* 2.76* 2.09*  HEPARINUNFRC -- -- 0.61  CREATININE 1.16 1.30 1.19  CKTOTAL -- -- --  CKMB -- -- --  TROPONINI -- -- --

## 2012-01-03 NOTE — Progress Notes (Signed)
Patient: Jimmy Myers Date of Encounter: 01/03/2012, 11:11 AM Admit date: 12/28/2011   Pleasant 78 yo WM admitted with pneumonia.  Found to have afib with RVR and newly documented EF 20-25%.  LAA thrombus on TEE and therefore rate control is planned for afib.     Subjective  Still SOB with exertion but improving; no chest pain.   Objective   Filed Vitals:   01/03/12 1015  BP: 107/63  Pulse: 93  Temp:   Resp: 20    Intake/Output Summary (Last 24 hours) at 01/03/12 1111 Last data filed at 01/03/12 1610  Gross per 24 hour  Intake   1060 ml  Output   1225 ml  Net   -165 ml   Tele- afib, V rates 110  Physical Exam: General: Well developed, 78 year old male in no acute distress. Head: Normocephalic  Neck: Supple.  Lungs: mild decreased BS bases Heart: Irregularly irregular. S1 S2 without murmurs, rubs, or gallops.  Abdomen: Soft, non-tender, non-distended  Extremities: Trace edema.   Neuro: Alert and oriented X 3. Moves all extremities spontaneously.  Inpatient Medications:   . aspirin EC  81 mg Oral Daily  . azithromycin  500 mg Intravenous Q24H  . cefTRIAXone (ROCEPHIN)  IV  1 g Intravenous Q24H  . docusate sodium  100 mg Oral BID  . furosemide  80 mg Intravenous Q12H  . guaiFENesin  1,200 mg Oral BID  . ipratropium  0.5 mg Nebulization QID  . levalbuterol  0.63 mg Nebulization QID  . pantoprazole  40 mg Oral Q1200  . potassium chloride  10 mEq Intravenous Q1 Hr x 4  . potassium chloride  20 mEq Oral BID  . potassium chloride  40 mEq Oral Once  . sodium chloride  3 mL Intravenous Q12H  . warfarin  7.5 mg Oral ONCE-1800  . Warfarin - Pharmacist Dosing Inpatient   Does not apply q1800   . diltiazem (CARDIZEM) infusion 15 mg/hr (12/31/11 0215)  . heparin 1,400 Units/hr (12/30/11 2151)    Basename 01/03/12 0530 01/02/12 0546  WBC 7.2 8.0  NEUTROABS -- --  HGB 11.7* 11.6*  HCT 36.2* 35.6*  MCV 96.8 96.0  PLT 67* 59*    Transesophageal Echocardiogram  12/30/2011:  - Left ventricle: The cavity size was dilated. Systolic function was severely reduced. The estimated ejection fraction was in the range of 20% to 25%. Diffuse hypokinesis. - Aortic valve: Mild regurgitation. - Aortic root: The aortic root was moderately dilated. - Mitral valve: Moderate regurgitation. - Left atrium: The atrium was moderately dilated. Left atrial appendage with severe spontaneous contrast and thrombus noted at tip. Emptying velocity was reduced. - Right ventricle: The cavity size was mildly dilated. Systolic function was moderately reduced. - Right atrium: The atrium was mildly dilated.     Assessment and Plan  1.  Atrial fibrillation with elevated V rates Due to LAA thrombus, we will have to rate control.  We cannot cardiovert electrically or chemically at this time.  He is therefore not a candidate for antiarrhythmic drugs. Dr Johney Frame has spoken with pts primary care at the [facility] who states that the [place] will not approve pradaxa for him.  We will therefore continue coumadin.   Change metoprolol to 100 mg po BID.  2.  New LV dysfunction with acute systolic CHF - ? tachycardia mediated;  Rate control afib as above Continue present dose of lasix; repeat BMET in AM Will consider further CV risk stratification once  clinically improved (as an outpatient)  3.  LAA clot - continue warfarin, repeat TEE in 6 weeks.  4.  Thrombocytopenia - ? etiology; patient reports low platelets x 1 year, followed by the [place], per primary team   5.  Pneumonia - clinically improving, per primary medical team  Possible DC in AM if stable At discharge, would follow-up with Norma Fredrickson in 2 weeks. Dr Johney Frame will see in 6 weeks.    Ramy Greth,MD 11:11 AM.

## 2012-01-04 DIAGNOSIS — I10 Essential (primary) hypertension: Secondary | ICD-10-CM

## 2012-01-04 DIAGNOSIS — I4891 Unspecified atrial fibrillation: Secondary | ICD-10-CM

## 2012-01-04 DIAGNOSIS — J159 Unspecified bacterial pneumonia: Secondary | ICD-10-CM

## 2012-01-04 DIAGNOSIS — I5021 Acute systolic (congestive) heart failure: Secondary | ICD-10-CM

## 2012-01-04 LAB — PROTIME-INR: INR: 3.01 — ABNORMAL HIGH (ref 0.00–1.49)

## 2012-01-04 LAB — BASIC METABOLIC PANEL
BUN: 36 mg/dL — ABNORMAL HIGH (ref 6–23)
Chloride: 105 mEq/L (ref 96–112)
GFR calc Af Amer: 72 mL/min — ABNORMAL LOW (ref >90)
Potassium: 4.4 mEq/L (ref 3.5–5.1)

## 2012-01-04 LAB — CBC
HCT: 36.2 % — ABNORMAL LOW (ref 39.0–52.0)
RDW: 15.4 % (ref 11.5–15.5)
WBC: 7.5 10*3/uL (ref 4.0–10.5)

## 2012-01-04 MED ORDER — DIGOXIN 0.25 MG/ML IJ SOLN
0.2500 mg | Freq: Four times a day (QID) | INTRAMUSCULAR | Status: AC
  Administered 2012-01-04 (×2): 0.25 mg via INTRAVENOUS
  Filled 2012-01-04 (×5): qty 1

## 2012-01-04 MED ORDER — DIGOXIN 125 MCG PO TABS
0.1250 mg | ORAL_TABLET | Freq: Every day | ORAL | Status: DC
  Administered 2012-01-05: 0.125 mg via ORAL
  Filled 2012-01-04: qty 1

## 2012-01-04 MED ORDER — WARFARIN SODIUM 2.5 MG PO TABS
2.5000 mg | ORAL_TABLET | Freq: Once | ORAL | Status: AC
  Administered 2012-01-04: 2.5 mg via ORAL
  Filled 2012-01-04: qty 1

## 2012-01-04 NOTE — Progress Notes (Signed)
Patient: Jimmy Myers Date of Encounter: 01/04/2012, 10:50 AM Admit date: 12/28/2011   Pleasant 78 yo WM admitted with pneumonia.  Found to have afib with RVR and newly documented EF 20-25%.  LAA thrombus on TEE and therefore rate control is planned for afib.     Subjective  Mild DOE; no chest pain.   Objective   Filed Vitals:   01/04/12 1019  BP: 92/55  Pulse: 102  Temp:   Resp: 18    Intake/Output Summary (Last 24 hours) at 01/04/12 1050 Last data filed at 01/04/12 0913  Gross per 24 hour  Intake    780 ml  Output   2225 ml  Net  -1445 ml   Tele- afib, V rates 110  Physical Exam: General: Well developed, 78 year old male in no acute distress. Head: Normocephalic  Neck: Supple.  Lungs: CTA Heart: Irregularly irregular. S1 S2 without murmurs, rubs, or gallops.  Abdomen: Soft, non-tender, non-distended  Extremities: Trace edema.   Neuro: Alert and oriented X 3. Moves all extremities spontaneously.  Inpatient Medications:   . aspirin EC  81 mg Oral Daily  . azithromycin  500 mg Intravenous Q24H  . cefTRIAXone (ROCEPHIN)  IV  1 g Intravenous Q24H  . docusate sodium  100 mg Oral BID  . furosemide  80 mg Intravenous Q12H  . guaiFENesin  1,200 mg Oral BID  . ipratropium  0.5 mg Nebulization QID  . levalbuterol  0.63 mg Nebulization QID  . pantoprazole  40 mg Oral Q1200  . potassium chloride  10 mEq Intravenous Q1 Hr x 4  . potassium chloride  20 mEq Oral BID  . potassium chloride  40 mEq Oral Once  . sodium chloride  3 mL Intravenous Q12H  . warfarin  7.5 mg Oral ONCE-1800  . Warfarin - Pharmacist Dosing Inpatient   Does not apply q1800   . diltiazem (CARDIZEM) infusion 15 mg/hr (12/31/11 0215)  . heparin 1,400 Units/hr (12/30/11 2151)    Basename 01/04/12 0545 01/03/12 0530  WBC 7.5 7.2  NEUTROABS -- --  HGB 12.0* 11.7*  HCT 36.2* 36.2*  MCV 95.8 96.8  PLT 78* 67*    Transesophageal Echocardiogram 12/30/2011:  - Left ventricle: The cavity size was  dilated. Systolic function was severely reduced. The estimated ejection fraction was in the range of 20% to 25%. Diffuse hypokinesis. - Aortic valve: Mild regurgitation. - Aortic root: The aortic root was moderately dilated. - Mitral valve: Moderate regurgitation. - Left atrium: The atrium was moderately dilated. Left atrial appendage with severe spontaneous contrast and thrombus noted at tip. Emptying velocity was reduced. - Right ventricle: The cavity size was mildly dilated. Systolic function was moderately reduced. - Right atrium: The atrium was mildly dilated.     Assessment and Plan  1.  Atrial fibrillation with elevated V rates Due to LAA thrombus, we will have to rate control.  We cannot cardiovert electrically or chemically at this time.  He is therefore not a candidate for antiarrhythmic drugs. Dr Johney Frame has spoken with pts primary care at the [facility] who states that the [place] will not approve pradaxa for him.  We will therefore continue coumadin.   Change metoprolol to 100 mg po BID. Add digoxin as HR 120 with ambulation  2.  New LV dysfunction with acute systolic CHF - ? tachycardia mediated;  Rate control afib as above Continue present dose of lasix; repeat BMET in AM Will consider further CV risk stratification once  clinically improved (as an outpatient)  3.  LAA clot - continue warfarin, repeat TEE in 6 weeks.  4.  Thrombocytopenia - ? etiology; patient reports low platelets x 1 year, followed by the [place], per primary team   5.  Pneumonia - clinically improving, per primary medical team  Possible DC in AM if HR better. At discharge, would follow-up with Norma Fredrickson in 2 weeks. Dr Johney Frame will see in 6 weeks.    Jimmy Fackler,MD 10:50 AM.

## 2012-01-04 NOTE — Progress Notes (Signed)
Digoxin given and monitored closely.  Tolerated without incident.  Administered per policy and order. Thanks, [employer]

## 2012-01-04 NOTE — Progress Notes (Signed)
ANTICOAGULATION CONSULT NOTE - Follow Up Consult  Pharmacy Consult for warfarin Indication: atrial fibrillation>>LAA clot  Assessment: 78 yo male on Coumadin PTA for Afib, now with new LAA clot. INR remains therapeutic at 3.0, no bleeding complications reported. Platelets still low at 78 (improved), but this is chronic per patient. Digoxin started yesterday, which may cause slight INR elevation.  Home coumadin dose: 5mg  daily  Goal of Therapy:  INR goal 2-3  Plan:  -Coumadin 2.5mg  PO x1 today. -Check INR in AM.  [name]. Saul Fordyce, PharmD Pager: [phone] 01/04/2012 1:10 PM  Labs:  Alvira Philips 01/04/12 0545 01/03/12 0530 01/02/12 0546  HGB 12.0* 11.7* --  HCT 36.2* 36.2* 35.6*  PLT 78* 67* 59*  APTT -- -- --  LABPROT 31.7* 27.1* 29.6*  INR 3.01* 2.46* 2.76*  HEPARINUNFRC -- -- --  CREATININE 1.11 1.16 1.30  CKTOTAL -- -- --  CKMB -- -- --  TROPONINI -- -- --

## 2012-01-04 NOTE — Progress Notes (Addendum)
Subjective: Patient seen and examined, denies any complaints.  Objective: Vital signs in last 24 hours: Temp:  [97.1 F (36.2 C)-97.5 F (36.4 C)] 97.3 F (36.3 C) (05/05 0500) Pulse Rate:  [102-110] 102  (05/05 1019) Resp:  [18-20] 18  (05/05 1019) BP: (92-113)/(55-76) 92/55 mmHg (05/05 1019) SpO2:  [95 %-98 %] 96 % (05/05 0847) Weight:  [92.3 kg (203 lb 7.8 oz)] 92.3 kg (203 lb 7.8 oz) (05/05 0500) Weight change: 0.3 kg (10.6 oz) Last BM Date: 01/03/12  Intake/Output from previous day: 05/04 0701 - 05/05 0700 In: 880 [P.O.:880] Out: 2325 [Urine:2325] Total I/O In: 120 [P.O.:120] Out: 100 [Urine:100]   Physical Exam:  General: Alert, awake, oriented x3, in no acute distress.  HEENT: No bruits, no goiter.  Heart: irregular irregular rhythm, without murmurs, rubs, gallops.  Lungs: Clear to auscultation bilaterally.  Abdomen: Soft, nontender, nondistended, positive bowel sounds.  Extremities: No clubbing cyanosis or edema with positive pedal pulses.  Neuro: Grossly intact, nonfocal.    Lab Results: Results for orders placed during the hospital encounter of 12/28/11 (from the past 24 hour(s))  PROTIME-INR     Status: Abnormal   Collection Time   01/04/12  5:45 AM      Component Value Range   Prothrombin Time 31.7 (*) 11.6 - 15.2 (seconds)   INR 3.01 (*) 0.00 - 1.49   CBC     Status: Abnormal   Collection Time   01/04/12  5:45 AM      Component Value Range   WBC 7.5  4.0 - 10.5 (K/uL)   RBC 3.78 (*) 4.22 - 5.81 (MIL/uL)   Hemoglobin 12.0 (*) 13.0 - 17.0 (g/dL)   HCT 16.1 (*) 09.6 - 52.0 (%)   MCV 95.8  78.0 - 100.0 (fL)   MCH 31.7  26.0 - 34.0 (pg)   MCHC 33.1  30.0 - 36.0 (g/dL)   RDW 04.5  40.9 - 81.1 (%)   Platelets 78 (*) 150 - 400 (K/uL)  BASIC METABOLIC PANEL     Status: Abnormal   Collection Time   01/04/12  5:45 AM      Component Value Range   Sodium 141  135 - 145 (mEq/L)   Potassium 4.4  3.5 - 5.1 (mEq/L)   Chloride 105  96 - 112 (mEq/L)   CO2 28  19  - 32 (mEq/L)   Glucose, Bld 86  70 - 99 (mg/dL)   BUN 36 (*) 6 - 23 (mg/dL)   Creatinine, Ser 9.14  0.50 - 1.35 (mg/dL)   Calcium 8.8  8.4 - 78.2 (mg/dL)   GFR calc non Af Amer 62 (*) >90 (mL/min)   GFR calc Af Amer 72 (*) >90 (mL/min)    Studies/Results: No results found.  Medications:    . cefTRIAXone (ROCEPHIN)  IV  1 g Intravenous Q24H  . digoxin  0.25 mg Intravenous Q6H  . digoxin  0.125 mg Oral Daily  . docusate sodium  100 mg Oral BID  . furosemide  80 mg Oral Daily  . guaiFENesin  1,200 mg Oral BID  . ipratropium  0.5 mg Nebulization QID  . levalbuterol  0.63 mg Nebulization QID  . metoprolol tartrate  100 mg Oral BID  . nystatin  5 mL Oral QID  . pantoprazole  40 mg Oral Q1200  . polyethylene glycol  17 g Oral Daily  . potassium chloride  40 mEq Oral BID  . sodium chloride  3 mL Intravenous Q12H  . warfarin  5 mg Oral ONCE-1800  . warfarin   Does not apply Once  . Warfarin - Pharmacist Dosing Inpatient   Does not apply q1800  . DISCONTD: aspirin EC  81 mg Oral Daily  . DISCONTD: metoprolol tartrate  50 mg Oral Q6H  . DISCONTD: nystatin  1 mL Oral Q6H    sodium chloride, acetaminophen, acetaminophen, clorazepate, levalbuterol, morphine, nitroGLYCERIN, ondansetron (ZOFRAN) IV, ondansetron, sodium chloride, zolpidem     Assessment/Plan:  Atrial fibrillation with rapid ventricular response/New onset a-fib  * Increased heart rate with ambulation noted, on metoprolol po , digoxin added to cardiology today. *TEE demonstrated left atrial appendage thrombus , no candidate for electric or chemical cardioversion at this time  *Plans are for long-term chronic anticoagulation- continue Coumadin as per pharmacy dosing.INR is therapeutic at 3.0 Acute systolic dysfunction secondary to tachycardia induced cardiomyopathy  *2-D echocardiogram demonstrates severe systolic dysfunction with estimated ejection fraction between 20 and 25% with diffuse hypokinesis .Felt to be due to  reversible tachycardia induced cardiomyopathy  *Continue Lasix 80 mg by mouth daily .  *Cardiac isoenzymes are negative so no indication for acute ischemic etiology  Community acquired pneumonia vs. Acute bronchitis  *No focal infiltrate seen on x-ray  *Completed empiric antibiotic coverage for community-acquired pneumonia  *Continue mucolytic and nebulizer therapy-Atrovent; Xopenex  Thrombocytopenia  *Improving and apparently chronic has been followed by [facility]  HTN (hypertension), benign  *Blood pressure well controlled  Hypokalemia  Replete when necessary and follow - goal is to keep >4.0  Disposition  *Seen by PT recommended cardiac rehabilitation consult , would probably discharge in the a.m. with home health PT and RN.       LOS: 7 days   Jimmy Myers 01/04/2012, 11:19 AM

## 2012-01-05 DIAGNOSIS — I10 Essential (primary) hypertension: Secondary | ICD-10-CM

## 2012-01-05 DIAGNOSIS — J159 Unspecified bacterial pneumonia: Secondary | ICD-10-CM

## 2012-01-05 DIAGNOSIS — I5021 Acute systolic (congestive) heart failure: Secondary | ICD-10-CM

## 2012-01-05 DIAGNOSIS — I4891 Unspecified atrial fibrillation: Secondary | ICD-10-CM

## 2012-01-05 LAB — BASIC METABOLIC PANEL
CO2: 26 mEq/L (ref 19–32)
Chloride: 105 mEq/L (ref 96–112)
Potassium: 4.4 mEq/L (ref 3.5–5.1)
Sodium: 140 mEq/L (ref 135–145)

## 2012-01-05 LAB — PROTIME-INR
INR: 2.77 — ABNORMAL HIGH (ref 0.00–1.49)
Prothrombin Time: 29.7 seconds — ABNORMAL HIGH (ref 11.6–15.2)

## 2012-01-05 LAB — CBC
MCV: 95.1 fL (ref 78.0–100.0)
Platelets: 89 10*3/uL — ABNORMAL LOW (ref 150–400)
RBC: 3.85 MIL/uL — ABNORMAL LOW (ref 4.22–5.81)
WBC: 7.4 10*3/uL (ref 4.0–10.5)

## 2012-01-05 MED ORDER — FUROSEMIDE 80 MG PO TABS: 80.0000 mg | ORAL_TABLET | Freq: Every day | ORAL | Status: DC

## 2012-01-05 MED ORDER — POLYETHYLENE GLYCOL 3350 17 G PO PACK: 17.0000 g | PACK | Freq: Every day | ORAL | Status: AC | PRN

## 2012-01-05 MED ORDER — OMEPRAZOLE 20 MG PO CPDR: 20.0000 mg | DELAYED_RELEASE_CAPSULE | Freq: Every day | ORAL | Status: DC

## 2012-01-05 MED ORDER — METOPROLOL TARTRATE 100 MG PO TABS: 100.0000 mg | ORAL_TABLET | Freq: Two times a day (BID) | ORAL | Status: DC

## 2012-01-05 MED ORDER — LEVALBUTEROL HCL 0.63 MG/3ML IN NEBU
0.6300 mg | INHALATION_SOLUTION | Freq: Two times a day (BID) | RESPIRATORY_TRACT | Status: DC
  Administered 2012-01-05: 0.63 mg via RESPIRATORY_TRACT
  Filled 2012-01-05 (×3): qty 3

## 2012-01-05 MED ORDER — WARFARIN SODIUM 5 MG PO TABS: 5.0000 mg | ORAL_TABLET | Freq: Every day | ORAL | Status: DC

## 2012-01-05 MED ORDER — DIGOXIN 125 MCG PO TABS: 0.1250 mg | ORAL_TABLET | Freq: Every day | ORAL | Status: DC

## 2012-01-05 MED ORDER — IPRATROPIUM BROMIDE 0.02 % IN SOLN
0.5000 mg | Freq: Two times a day (BID) | RESPIRATORY_TRACT | Status: DC
  Administered 2012-01-05: 0.5 mg via RESPIRATORY_TRACT
  Filled 2012-01-05: qty 2.5

## 2012-01-05 MED ORDER — POTASSIUM CHLORIDE CRYS ER 20 MEQ PO TBCR: 40.0000 meq | EXTENDED_RELEASE_TABLET | Freq: Every day | ORAL | Status: DC

## 2012-01-05 MED ORDER — GUAIFENESIN ER 600 MG PO TB12: 1200.0000 mg | ORAL_TABLET | Freq: Two times a day (BID) | ORAL | Status: DC

## 2012-01-05 MED ORDER — WARFARIN SODIUM 3 MG PO TABS
3.0000 mg | ORAL_TABLET | Freq: Once | ORAL | Status: DC
  Filled 2012-01-05 (×2): qty 1

## 2012-01-05 MED ORDER — NITROGLYCERIN 0.4 MG SL SUBL: 0.4000 mg | SUBLINGUAL_TABLET | SUBLINGUAL | Status: DC | PRN

## 2012-01-05 NOTE — Progress Notes (Signed)
ANTICOAGULATION CONSULT NOTE - Follow Up Consult  Pharmacy Consult for Coumadin Indication: Afib, LAA thrombus  Allergies  Allergen Reactions  . Codeine Other (See Comments)    nervousness  . Simvastatin Other (See Comments)    unknown  . Vicodin (Hydrocodone-Acetaminophen) Other (See Comments)    nervousness    Patient Measurements: Height: 5\' 10"  (177.8 cm) Weight: 200 lb 2.8 oz (90.8 kg) IBW/kg (Calculated) : 73  Heparin Dosing Weight:   Vital Signs: Temp: 98.5 F (36.9 C) (05/06 0607) Temp src: Oral (05/06 0607) BP: 111/78 mmHg (05/06 0607) Pulse Rate: 102  (05/06 0607)  Labs:  Basename 01/05/12 0545 01/04/12 0545 01/03/12 0530  HGB 12.1* 12.0* --  HCT 36.6* 36.2* 36.2*  PLT 89* 78* 67*  APTT -- -- --  LABPROT 29.7* 31.7* 27.1*  INR 2.77* 3.01* 2.46*  HEPARINUNFRC -- -- --  CREATININE 1.09 1.11 1.16  CKTOTAL -- -- --  CKMB -- -- --  TROPONINI -- -- --   Estimated Creatinine Clearance: 64.3 ml/min (by C-G formula based on Cr of 1.09).  Assessment: 79yom on Coumadin for AFib and newly found LAA thrombus. INR (2.77) remains therapeutic. - H/H stable, Plts low but trending up - No significant bleeding reported  Goal of Therapy:  INR 2-3   Plan:  1. Coumadin 3mg  po x 1 today 2. Follow-up AM INR and discharge plans  Cleon Dew [phone] 01/05/2012,9:58 AM

## 2012-01-05 NOTE — Discharge Instructions (Addendum)
PT WILL NEED TO GO TO THE [facility], LOCATED AT [address], [place], [place] ON TUES 01/06/12 AT 10AM.  PLEASE CALL [phone] WITH QUESTIONS OR CANCELLATIONS.

## 2012-01-05 NOTE — Plan of Care (Signed)
Problem: Food- and Nutrition-Related Knowledge Deficit (NB-1.1) Goal: Nutrition education Formal process to instruct or train a patient/client in a skill or to impart knowledge to help patients/clients voluntarily manage or modify food choices and eating behavior to maintain or improve health.  Outcome: Completed/Met Date Met:  01/05/12 RD verbally consulted by RN in HF rounds for education for pt. Pt is new onset CHF. RD spoke with pt and wife about current diet. Pt eats outside the home often and wife admits to cooking with salt. Wife is agreeable to cooking with out salt and encouraged pt to eat more at home. Pt seems willing to make some changes. RD went over common foods high in salt and daily intake with pt/wife. RD made recommendations to lowering salt in favorite meals. RD provided pt with low sodium nutrition therapy hand out. No additional questions at this time. No additional nutrition interventions at this time. Diet low sodium intake 90-100%. Body mass index is 28.72 kg/(m^2). overweight.    Jimmy Myers MARIE

## 2012-01-05 NOTE — Progress Notes (Signed)
CARDIAC REHAB PHASE I   PRE:  Rate/Rhythm: 95 afib    BP: sitting 110/70    SaO2: 95 RA  MODE:  Ambulation: 460 ft   POST:  Rate/Rhythm: 108 Afib    BP: sitting 102/64     SaO2: 96 RA  Pt c/o weakness in legs. Slow and steady walk. Denied SOB. HR remained low 100s afib. Tired toward end. Reviewed CHF with pt and family. [phone]  Harriet Masson CES, ACSM

## 2012-01-05 NOTE — Progress Notes (Signed)
Patient was admitted with shortness of breath. Patient has no previous pulmonary history. Currently on no home respiratory medications and has no history of smoking. Patient has a productive cough and is requiring no oxygen. RT will change nebulizers to BID.

## 2012-01-05 NOTE — Progress Notes (Signed)
Patient: Jimmy Myers Date of Encounter: 01/05/2012, 9:51 AM Admit date: 12/28/2011     Subjective  Mr. Kozakiewicz denies any new complaints. He continues to have DOE but denies worsening. He denies chest pain, palpitations, dizziness or syncope. He denies swelling, orthopnea or PND. He is ambulating without difficulty.   Objective   Filed Vitals:   01/05/12 0607  BP: 111/78  Pulse: 102  Temp: 98.5 F (36.9 C)  Resp: 19     Intake/Output Summary (Last 24 hours) at 01/05/12 0951 Last data filed at 01/05/12 0859  Gross per 24 hour  Intake   1140 ml  Output   2850 ml  Net  -1710 ml    Physical Exam: General: Well developed, well appearing 78 year old male, in no acute distress. Head: Normocephalic, atraumatic, sclera non-icteric, no xanthomas, nares are without discharge.  Neck: Supple. JVD not elevated, examined sitting upright. Lungs: Clear bilaterally to auscultation without wheezes, rales, or rhonchi. Breathing is unlabored. Heart: Irregularly irregular S1 S2 without murmurs, rubs, or gallops.  Abdomen: Soft, non-distended. Extremities: No clubbing or cyanosis. No edema.  Distal pedal pulses are 2+ and equal bilaterally. Neuro: Alert and oriented X 3. Moves all extremities spontaneously.  Inpatient Medications:  . digoxin  0.25 mg Intravenous Q6H  . digoxin  0.125 mg Oral Daily  . docusate sodium  100 mg Oral BID  . furosemide  80 mg Oral Daily  . guaiFENesin  1,200 mg Oral BID  . ipratropium  0.5 mg Nebulization BID  . levalbuterol  0.63 mg Nebulization BID  . metoprolol tartrate  100 mg Oral BID  . nystatin  5 mL Oral QID  . pantoprazole  40 mg Oral Q1200  . polyethylene glycol  17 g Oral Daily  . potassium chloride  40 mEq Oral BID  . sodium chloride  3 mL Intravenous Q12H  . warfarin  2.5 mg Oral ONCE-1800   Labs: [facility] 01/05/12 0545 01/04/12 0545  NA 140 141  K 4.4 4.4  CL 105 105  CO2 26 28  GLUCOSE 96 86  BUN 33* 36*  CREATININE 1.09 1.11  CALCIUM  8.7 8.8  MG -- --  PHOS -- --    Basename 01/05/12 0545 01/04/12 0545  WBC 7.4 7.5  NEUTROABS -- --  HGB 12.1* 12.0*  HCT 36.6* 36.2*  MCV 95.1 95.8  PLT 89* 78*    Radiology: no CXR in last 24-48 hours   Transesophageal Echocardiogram 12/30/2011:  - Left ventricle: The cavity size was dilated. Systolic function was severely reduced. The estimated ejection fraction was in the range of 20% to 25%. Diffuse hypokinesis. - Aortic valve: Mild regurgitation. - Aortic root: The aortic root was moderately dilated. - Mitral valve: Moderate regurgitation. - Left atrium: The atrium was moderately dilated. Left atrial appendage with severe spontaneous contrast and thrombus noted at tip. Emptying velocity was reduced. - Right ventricle: The cavity size was mildly dilated. Systolic function was moderately reduced. - Right atrium: The atrium was mildly dilated.   Telemetry: persistent AF with improved rate control; rates overall consistently <110   Assessment and Plan  1. Atrial fibrillation with improved rate control -  Due to LAA thrombus, we will have to rate control only. We cannot cardiovert electrically or chemically at this time. He is therefore not a candidate for antiarrhythmic drugs. Continue metoprolol and digoxin. Continue chronic anticoagulation with warfarin.  2. New LV dysfunction with acute systolic CHF - ? tachycardia mediated  Rate control AF as outlined above. Will consider further CV risk stratification once clinically improved (probably as an outpatient).   3. LAA clot - continue warfarin, repeat TEE in 6 weeks.   4. Thrombocytopenia - improving; ? etiology; patient reports low platelets x 1 year, followed by the [place]; per primary team   5. Pneumonia - completed abx course, per primary medical team   Signed, EDMISTEN, BROOKE O. PA-C I have seen, examined the patient, and reviewed the above assessment and plan.  Agree with above assessment. Pt to DC to home today.  V  rates are better. Continue current medicine upon discharge. Follow-up with Norma Fredrickson in my office in 2 weeks. Followup with me in 6 weeks at which time we will arrange [facility]. [facility].  Co Sign: Hillis Range, MD 01/05/2012 5:00 PM

## 2012-01-05 NOTE — Discharge Summary (Addendum)
Patient ID: Jimmy Myers MRN: [id number] DOB/AGE: 05-Jun-1932 78 y.o.  Admit date: 12/28/2011 Discharge date: 01/05/2012  Primary Care Physician:  Josue Hector, MD, MD   Discharge Diagnoses:    Present on Admission:  .CHF, acute systolic  .Community acquired pneumonia .New onset a-fib .HTN (hypertension), benign .Atrial fibrillation with rapid ventricular response . hypokalemia  .Acute Systolic dysfunction/cardiomyopathy due to prolonged tachycardia/EF 25% .Thrombocytopenia  Medication List  As of 01/05/2012 12:43 PM   STOP taking these medications         diltiazem 120 MG tablet      potassium chloride 10 MEQ tablet         TAKE these medications         clorazepate 3.75 MG tablet   Commonly known as: TRANXENE   Take 3.75 mg by mouth daily as needed. For anxiety      digoxin 0.125 MG tablet   Commonly known as: LANOXIN   Take 1 tablet (0.125 mg total) by mouth daily.      furosemide 80 MG tablet   Commonly known as: LASIX   Take 1 tablet (80 mg total) by mouth daily.      guaiFENesin 600 MG 12 hr tablet   Commonly known as: MUCINEX   Take 2 tablets (1,200 mg total) by mouth 2 (two) times daily.      metoprolol 100 MG tablet   Commonly known as: LOPRESSOR   Take 1 tablet (100 mg total) by mouth 2 (two) times daily.      nitroGLYCERIN 0.4 MG SL tablet   Commonly known as: NITROSTAT   Place 1 tablet (0.4 mg total) under the tongue every 5 (five) minutes x 3 doses as needed for chest pain.      omeprazole 20 MG capsule   Commonly known as: PRILOSEC   Take 1 capsule (20 mg total) by mouth daily.      polyethylene glycol packet   Commonly known as: MIRALAX / GLYCOLAX   Take 17 g by mouth daily as needed.      potassium chloride SA 20 MEQ tablet   Commonly known as: K-DUR,KLOR-CON   Take 2 tablets (40 mEq total) by mouth daily.      warfarin 5 MG tablet   Commonly known as: COUMADIN   Take 1 tablet (5 mg total) by mouth daily. Start taking tomorrow  01/07/12 ,follow your doctor instructions         ASK your doctor about these medications         furosemide 20 MG tablet   Commonly known as: LASIX   Take 20 mg by mouth 2 (two) times daily.             Consults:  [facility] cardiology/EP   Significant Diagnostic Studies:  Dg Chest Portable 1 View  12/28/2011  *RADIOLOGY REPORT*  Clinical Data: Atrial fibrillation.  History of congestive heart failure.  PORTABLE CHEST - 1 VIEW  Comparison: 07/18/2005.  Findings: 1310 hours.  There is mild patient rotation to the right. The heart is enlarged.  There is vascular congestion with new perihilar and lower lobe air space opacities most consistent with edema.  There are probable small bilateral pleural effusions.  No pneumothorax is evident.  IMPRESSION: Bilateral air space opacities and probable pleural effusions, most consistent with congestive heart failure.  Original Report Authenticated By: Gerrianne Scale, M.D.    Brief H and P: For complete details please refer to admission H and P, but in  brief  Jimmy Myers comes in with worsening shortness of breath, building up over the last month but more severe in the last week or so. He has had cough productive of sputum(which is not clear per his account) in the last 2 days. He was seen at the [place] 2 days and was started on cardizem, coumadin for newly diagnosed afib. He also received lasix. He was supposed to follow at the [facility] tomorrow but could not wait till then, hence presentation in the ED today. He says he was getting increasingly short winded in the last month, and would stop to get relief. He denies gaining weight, but has noticed swelling of the legs, more on the left side. He denies orthopnea or PND. He has also been feeling unusually warm, particularly today. He denies chest pain. No diarrhea, vomiting or dysuria. IN ED his CXR suggested congestion, BNP was 4000, wbc 12000, temp 99.69F. He has been started on cardizem,  lasix/ceftriaxone/zithromax/NTG, and referred for admission. He feels somewhat better.   Hospital Course:  Atrial fibrillation with rapid ventricular response/New onset a-fib  Patient was initially observed in the skin down unit and was treated with IV Cardizem for rate control, he was closely followed by cardiology and EP service. His heart rate was improved and he was switched to by mouth metoprolol po , digoxin was also added . Patient was also anticoagulated with Coumadin as per pharmacy dosing.Patient to follow as outpatient with PCP or cardiology for INR monitoring .will ask case manger to arrange before discharge. *TEE demonstrated left atrial appendage thrombus , he was felt to be no candidate for electric or chemical cardioversion at this time .plan cystoscopy repeat TEE in 6 weeks. Acute systolic dysfunction secondary to tachycardia induced cardiomyopathy  *2-D echocardiogram demonstrates severe systolic dysfunction with estimated ejection fraction between 20 and 25% with diffuse hypokinesis .Felt to be due to reversible tachycardia induced cardiomyopathy  *Patient was treated with IV Lasix and then transitioned to Lasix 80 mg by mouth daily . By mouth potassium was added. *Cardiac isoenzymes are negative so no indication for acute ischemic etiology  Community acquired pneumonia vs. Acute bronchitis  *No focal infiltrate seen on x-ray  *Completed empiric antibiotic coverage for community-acquired pneumonia  Thrombocytopenia  *Improving and apparently chronic has been followed by [facility] , patient to follow with PCP. HTN (hypertension), benign  *Blood pressure well controlled  Hypokalemia  Replete when necessary and follow - goal is to keep >4.0 . Last potassium today is 4.4, will discharge on 40 mEq KCl daily to take while on Lasix. Patient to follow with PCP for repeat labs. Disposition  To home with wife. I updated his wife and son and  field FMLA papers.  Subjective: Patient seen and examined, denies any current complaints.  Filed Vitals:   01/05/12 0607  BP: 111/78  Pulse: 102  Temp: 98.5 F (36.9 C)  Resp: 19   General: Alert, awake, oriented x3, in no acute distress.  HEENT: No bruits, no goiter.  Heart: irregular irregular rhythm, without murmurs, rubs, gallops.  Lungs: Clear to auscultation bilaterally.  Abdomen: Soft, nontender, nondistended, positive bowel sounds.  Extremities: No clubbing cyanosis or edema with positive pedal pulses.  Neuro: Grossly intact, nonfocal.   Disposition and Follow-up:  To home with wife Follow with PCP and cardiologist.   Time spent on Discharge:    Signed: ABDULLAH,SOSAN 01/05/2012, 12:43 PM  Please note: The patient was not discharged on an ACE/ARB despite  her decreased EF because of worsening renal function.

## 2012-01-06 ENCOUNTER — Ambulatory Visit (INDEPENDENT_AMBULATORY_CARE_PROVIDER_SITE_OTHER): Payer: Medicare Other | Admitting: Nurse Practitioner

## 2012-01-06 ENCOUNTER — Other Ambulatory Visit: Payer: Self-pay | Admitting: *Deleted

## 2012-01-06 ENCOUNTER — Telehealth: Payer: Self-pay | Admitting: Internal Medicine

## 2012-01-06 ENCOUNTER — Encounter: Payer: Self-pay | Admitting: Nurse Practitioner

## 2012-01-06 ENCOUNTER — Ambulatory Visit
Admission: RE | Admit: 2012-01-06 | Discharge: 2012-01-06 | Disposition: A | Discharge status: Home / Self Care | Payer: Medicare Other | Source: Ambulatory Visit | Attending: Nurse Practitioner | Admitting: Nurse Practitioner

## 2012-01-06 ENCOUNTER — Ambulatory Visit (INDEPENDENT_AMBULATORY_CARE_PROVIDER_SITE_OTHER): Payer: Medicare Other | Admitting: *Deleted

## 2012-01-06 DIAGNOSIS — R0602 Shortness of breath: Secondary | ICD-10-CM

## 2012-01-06 DIAGNOSIS — I519 Heart disease, unspecified: Secondary | ICD-10-CM

## 2012-01-06 DIAGNOSIS — I77819 Aortic ectasia, unspecified site: Secondary | ICD-10-CM

## 2012-01-06 DIAGNOSIS — I4891 Unspecified atrial fibrillation: Secondary | ICD-10-CM

## 2012-01-06 DIAGNOSIS — I5189 Other ill-defined heart diseases: Secondary | ICD-10-CM

## 2012-01-06 DIAGNOSIS — I5020 Unspecified systolic (congestive) heart failure: Secondary | ICD-10-CM

## 2012-01-06 DIAGNOSIS — I513 Intracardiac thrombosis, not elsewhere classified: Secondary | ICD-10-CM

## 2012-01-06 DIAGNOSIS — I7789 Other specified disorders of arteries and arterioles: Secondary | ICD-10-CM

## 2012-01-06 DIAGNOSIS — Z7901 Long term (current) use of anticoagulants: Secondary | ICD-10-CM

## 2012-01-06 LAB — POCT INR: INR: 1.8

## 2012-01-06 MED ORDER — MAGIC MOUTHWASH: 5.0000 mL | Freq: Four times a day (QID) | ORAL | Status: DC

## 2012-01-06 MED ORDER — LEVOFLOXACIN 500 MG PO TABS: 500.0000 mg | ORAL_TABLET | Freq: Every day | ORAL | Status: DC

## 2012-01-06 NOTE — Assessment & Plan Note (Signed)
Patient presents with complaints of shortness of breath and cough. I suspect this is multifactorial (cardiac and pulmonary related). Was just discharged one day ago. Has known LV dysfunction. CXR with no pneumonia today. His lungs do lear with coughing. I have increased his Lasix for the next 2 days to 80 mg in the am and 40 mg in the pm, then back to his 80 mg daily. Will give him another round of antibiotics with Levaquin 500 mg daily. He will be getting his INR checked on Friday. Family is getting scales to start daily weights. I am also rechecking his labs today as well. I will see him back in a week. Will plan for repeat TEE with CV after 6 weeks of adequate anticoagulation. He is also seeing his PCP tomorrow. Patient is agreeable to this plan and will call if any problems develop in the interim.

## 2012-01-06 NOTE — Telephone Encounter (Signed)
Patient's son Bala called stated his father was discharged from hospital yesterday and today he is sob and when he breathes he hears rattling.States father had INR done this morning and nurse listen to his lungs and heard fluid in rt upper lung.States he has appointment with Dr.Nyland tomorrow 01/07/12,but he is worried about his father and would like him seen today.Spoke with Norma Fredrickson NP she advised to take an extra lasix 80 mg now,cxr to be done at 2:00 pm at [facility].Appointment scheduled with Lawson Fiscal today at 3:00 pm.

## 2012-01-06 NOTE — Patient Instructions (Addendum)
Take the Lasix 80 mg in the am and 40 mg in the early afternoon for 3 days, then decrease back to just 80 mg a day  We are going to check some labs today  I am going to give you another round of antibiotics - Levaquin 500 mg daily for 7 days  Stay on your other medicines. Continue to use the Mucinex  I will see you in a week.  Minimize your salt  Try to weigh daily.   Call the [facility] office at [phone] if you have any questions, problems or concerns.

## 2012-01-06 NOTE — Progress Notes (Signed)
Jimmy Myers Date of Birth: 06-13-1932 Medical Record #[id number]  History of Present Illness: Jimmy Myers is seen today for a work in visit. He is here with his wife and son. He was just discharged from the hospital yesterday. He has new onset of atrial fib with RVR. Now on coumadin. TEE showed left atrial appendage thrombus. He has systolic heart failure and his EF is in the 20 to 25% range. He is on beta blocker, ACE, Digoxin and diuretics. He had some pneumonia as well as his heart failure and did receive antibiotics during that stay. It is felt that his atrial fib/tachycardia is resulting in the weakness of his heart muscle. He is to have repeat TEE with CV after 6 weeks of adequate anticoagulation.   He comes in today. He is here with his wife and son. The son called earlier. They got worried about the patient and wanted to get him "checked out". Thought he may be more short of breath. Still coughing. No fever or chills. Do not have scales at home. Hasn't been home long enough to really eat and denies extra salt. His cough is productive. Says his sputum looks milky and is thick. He is on Mucinex. No inhalers. He is to see Dr. Lysbeth Galas tomorrow. No swelling reported. He does feel a little weak. No chest pain. He did take an extra dose of Lasix prior to his arrival here this afternoon and had a CXR.   Allergies  Allergen Reactions  . Codeine Other (See Comments)    nervousness  . Simvastatin Other (See Comments)    unknown  . Vicodin (Hydrocodone-Acetaminophen) Other (See Comments)    nervousness   CURRENT MEDICINES: clorazepate 3.75 MG tablet    Commonly known as: TRANXENE    Take 3.75 mg by mouth daily as needed. For anxiety     digoxin 0.125 MG tablet    Commonly known as: LANOXIN    Take 1 tablet (0.125 mg total) by mouth daily.     furosemide 80 MG tablet    Commonly known as: LASIX    Take 1 tablet (80 mg total) by mouth daily.     guaiFENesin 600 MG 12 hr tablet    Commonly known  as: MUCINEX    Take 2 tablets (1,200 mg total) by mouth 2 (two) times daily.     metoprolol 100 MG tablet    Commonly known as: LOPRESSOR    Take 1 tablet (100 mg total) by mouth 2 (two) times daily.     nitroGLYCERIN 0.4 MG SL tablet    Commonly known as: NITROSTAT    Place 1 tablet (0.4 mg total) under the tongue every 5 (five) minutes x 3 doses as needed for chest pain.     omeprazole 20 MG capsule    Commonly known as: PRILOSEC    Take 1 capsule (20 mg total) by mouth daily.     polyethylene glycol packet    Commonly known as: MIRALAX / GLYCOLAX    Take 17 g by mouth daily as needed.     potassium chloride SA 20 MEQ tablet    Commonly known as: K-DUR,KLOR-CON    Take 2 tablets (40 mEq total) by mouth daily.     warfarin 5 MG tablet    Commonly known as: COUMADIN    Take 1 tablet (5 mg total) by mouth daily. Start taking tomorrow 01/07/12 ,follow your doctor instructions       Past Medical History  Diagnosis  Date  . CHF (congestive heart failure)   . A-fib   . Anxiety     Past Surgical History  Procedure Date  . Knee arthroscopy rt knee  . Prostate surgery   . Tee without cardioversion 12/30/2011    Procedure: TRANSESOPHAGEAL ECHOCARDIOGRAM (TEE);  Surgeon: Lewayne Bunting, MD;  Location: [facility] ENDOSCOPY;  Service: Cardiovascular;  Laterality: N/A;  . Cardioversion 12/30/2011    Procedure: CARDIOVERSION;  Surgeon: Lewayne Bunting, MD;  Location[facility] ENDOSCOPY;  Service: Cardiovascular;  Laterality: N/A;  . Esophagogastroduodenoscopy 12/30/2011    Procedure: ESOPHAGOGASTRODUODENOSCOPY (EGD);  Surgeon: Lewayne Bunting, MD;  Location: [facility] ENDOSCOPY;  Service: Cardiovascular;  Laterality: N/A;  . Esophagogastroduodenoscopy 12/30/2011    Procedure: ESOPHAGOGASTRODUODENOSCOPY (EGD);  Surgeon: Hart Carwin, MD;  Location: [facility] ENDOSCOPY;  Service: Endoscopy;  Laterality: N/A;    History  Smoking status  . Never Smoker   Smokeless tobacco  . Not on file    History    Alcohol Use No    No family history on file.  Review of Systems: The review of systems is positive for dyspnea and cough.  All other systems were reviewed and are negative.  Physical Exam: BP 98/68  Pulse 100  Ht 5' 10.5" (1.791 m)  Wt 201 lb (91.173 kg)  BMI 28.43 kg/m2  SpO2 96% Patient is very pleasant and in no acute distress. Skin is warm and dry. Color is normal.  HEENT is unremarkable but he has several missing teeth. Normocephalic/atraumatic. PERRL. Sclera are nonicteric. Neck is supple. No masses. No JVD. Lungs show some diffuse rales and ronchi. He has some clearing with coughing. Cardiac exam shows an irregular rhythm. He is a little tachycardic today. Abdomen is soft. Extremities are without edema. Gait and ROM are intact. No gross neurologic deficits noted.   LABORATORY DATA: Lab Results  Component Value Date   WBC 7.4 01/05/2012   HGB 12.1* 01/05/2012   HCT 36.6* 01/05/2012   PLT 89* 01/05/2012   GLUCOSE 96 01/05/2012   CHOL 85 12/29/2011   TRIG 30 12/29/2011   HDL 40 12/29/2011   LDLCALC 39 12/29/2011   ALT 34 12/29/2011   AST 23 12/29/2011   NA 140 01/05/2012   K 4.4 01/05/2012   CL 105 01/05/2012   CREATININE 1.09 01/05/2012   BUN 33* 01/05/2012   CO2 26 01/05/2012   TSH 0.498 12/28/2011   INR 1.8 01/06/2012    ECHO results 12/2011 - Left ventricle: The cavity size was dilated. Systolic function was severely reduced. The estimated ejection fraction was in the range of 20% to 25%. Diffuse hypokinesis. - Aortic valve: Mild regurgitation. - Aortic root: The aortic root was moderately dilated. - Mitral valve: Moderate regurgitation. - Left atrium: The atrium was moderately dilated. Left atrial appendage with severe spontaneous contrast and thrombus noted at tip. Emptying velocity was reduced. - Right ventricle: The cavity size was mildly dilated. Systolic function was moderately reduced. - Right atrium: The atrium was mildly dilated. Impressions:  - Initial attempt at probe  insertion met some resistance; Dr Juanda Chance of GI performed endoscopy and found no significant obstruction. Omniplane probe then passed without difficulty. Patient with LAA thrombus; recommend 4-6 weeks of therapeutic coumadin and then repeat TEE to RO LAA thrombus prior to DCCV.  CXR IMPRESSION (01/06/2012): Cardiomegaly without edema. Small pleural effusions.  Original Report Authenticated By: Donavan Burnet, M.D.   Assessment / Plan:

## 2012-01-06 NOTE — Telephone Encounter (Signed)
Pt having some fluid on his lungs , just got out of the hospital for pneumonia last n ight doesn't want a repeat, does he need med or appt, requesting asap call pls call [phone] son freddie

## 2012-01-07 LAB — BASIC METABOLIC PANEL
BUN: 31 mg/dL — ABNORMAL HIGH (ref 6–23)
CO2: 27 mEq/L (ref 19–32)
Calcium: 8.8 mg/dL (ref 8.4–10.5)
Chloride: 103 mEq/L (ref 96–112)
Creatinine, Ser: 1.3 mg/dL (ref 0.4–1.5)
GFR: 56.78 mL/min — ABNORMAL LOW (ref >60.00)
Glucose, Bld: 126 mg/dL — ABNORMAL HIGH (ref 70–99)
Potassium: 4.2 mEq/L (ref 3.5–5.1)
Sodium: 140 mEq/L (ref 135–145)

## 2012-01-07 LAB — CBC WITH DIFFERENTIAL/PLATELET
Basophils Absolute: 0 10*3/uL (ref 0.0–0.1)
Basophils Relative: 0.5 % (ref 0.0–3.0)
Eosinophils Absolute: 0.1 10*3/uL (ref 0.0–0.7)
Eosinophils Relative: 1.2 % (ref 0.0–5.0)
HCT: 39 % (ref 39.0–52.0)
Hemoglobin: 12.9 g/dL — ABNORMAL LOW (ref 13.0–17.0)
Lymphocytes Relative: 17.4 % (ref 12.0–46.0)
Lymphs Abs: 1.3 10*3/uL (ref 0.7–4.0)
MCHC: 33.2 g/dL (ref 30.0–36.0)
MCV: 94.7 fl (ref 78.0–100.0)
Monocytes Absolute: 0.6 10*3/uL (ref 0.1–1.0)
Monocytes Relative: 8.7 % (ref 3.0–12.0)
Neutro Abs: 5.4 10*3/uL (ref 1.4–7.7)
Neutrophils Relative %: 72.2 % (ref 43.0–77.0)
Platelets: 103 10*3/uL — ABNORMAL LOW (ref 150.0–400.0)
RBC: 4.12 Mil/uL — ABNORMAL LOW (ref 4.22–5.81)
RDW: 15.9 % — ABNORMAL HIGH (ref 11.5–14.6)
WBC: 7.4 10*3/uL (ref 4.5–10.5)

## 2012-01-07 LAB — BRAIN NATRIURETIC PEPTIDE: Pro B Natriuretic peptide (BNP): 440 pg/mL — ABNORMAL HIGH (ref 0.0–100.0)

## 2012-01-09 ENCOUNTER — Ambulatory Visit (INDEPENDENT_AMBULATORY_CARE_PROVIDER_SITE_OTHER): Payer: Medicare Other | Admitting: *Deleted

## 2012-01-09 DIAGNOSIS — I513 Intracardiac thrombosis, not elsewhere classified: Secondary | ICD-10-CM

## 2012-01-09 DIAGNOSIS — I5189 Other ill-defined heart diseases: Secondary | ICD-10-CM

## 2012-01-09 DIAGNOSIS — I4891 Unspecified atrial fibrillation: Secondary | ICD-10-CM

## 2012-01-09 DIAGNOSIS — Z7901 Long term (current) use of anticoagulants: Secondary | ICD-10-CM

## 2012-01-09 LAB — POCT INR: INR: 2.5

## 2012-01-12 DIAGNOSIS — I7789 Other specified disorders of arteries and arterioles: Secondary | ICD-10-CM | POA: Insufficient documentation

## 2012-01-12 NOTE — Assessment & Plan Note (Signed)
Noted to have aortic root enlargement on his echo. I asked Dr. Johney Frame to review. Will plan on checking ultrasound of the aorta per Dr. Jenel Lucks recommendation. Patient has follow up visit and will be informed.

## 2012-01-13 ENCOUNTER — Encounter: Payer: Self-pay | Admitting: Nurse Practitioner

## 2012-01-13 ENCOUNTER — Ambulatory Visit (INDEPENDENT_AMBULATORY_CARE_PROVIDER_SITE_OTHER): Payer: Medicare Other | Admitting: Nurse Practitioner

## 2012-01-13 ENCOUNTER — Ambulatory Visit (INDEPENDENT_AMBULATORY_CARE_PROVIDER_SITE_OTHER)
Admission: RE | Admit: 2012-01-13 | Discharge: 2012-01-13 | Disposition: A | Discharge status: Home / Self Care | Payer: Medicare Other | Source: Ambulatory Visit | Attending: Nurse Practitioner | Admitting: Nurse Practitioner

## 2012-01-13 ENCOUNTER — Other Ambulatory Visit: Payer: Self-pay | Admitting: *Deleted

## 2012-01-13 ENCOUNTER — Ambulatory Visit: Payer: Medicare Other | Admitting: Nurse Practitioner

## 2012-01-13 DIAGNOSIS — I7789 Other specified disorders of arteries and arterioles: Secondary | ICD-10-CM

## 2012-01-13 DIAGNOSIS — I5020 Unspecified systolic (congestive) heart failure: Secondary | ICD-10-CM

## 2012-01-13 DIAGNOSIS — J209 Acute bronchitis, unspecified: Secondary | ICD-10-CM

## 2012-01-13 DIAGNOSIS — I4891 Unspecified atrial fibrillation: Secondary | ICD-10-CM

## 2012-01-13 DIAGNOSIS — I779 Disorder of arteries and arterioles, unspecified: Secondary | ICD-10-CM

## 2012-01-13 DIAGNOSIS — I519 Heart disease, unspecified: Secondary | ICD-10-CM

## 2012-01-13 LAB — BASIC METABOLIC PANEL
BUN: 34 mg/dL — ABNORMAL HIGH (ref 6–23)
CO2: 29 mEq/L (ref 19–32)
Calcium: 9.7 mg/dL (ref 8.4–10.5)
Chloride: 102 mEq/L (ref 96–112)
Creat: 1.42 mg/dL — ABNORMAL HIGH (ref 0.50–1.35)
Glucose, Bld: 84 mg/dL (ref 70–99)
Potassium: 4.3 mEq/L (ref 3.5–5.3)
Sodium: 141 mEq/L (ref 135–145)

## 2012-01-13 LAB — DIGOXIN LEVEL: Digoxin Level: 0.77 ng/mL — ABNORMAL LOW (ref 0.8–2.0)

## 2012-01-13 MED ORDER — IOHEXOL 300 MG/ML  SOLN
100.0000 mL | Freq: Once | INTRAMUSCULAR | Status: AC | PRN
  Administered 2012-01-13: 100 mL via INTRAVENOUS

## 2012-01-13 MED ORDER — FUROSEMIDE 80 MG PO TABS: 80.0000 mg | ORAL_TABLET | Freq: Every day | ORAL | Status: DC

## 2012-01-13 NOTE — Assessment & Plan Note (Signed)
Moderate by echo. Also reporting hemoptysis. Will check CT with further disposition to follow.

## 2012-01-13 NOTE — Assessment & Plan Note (Signed)
Rate is currently controlled on beta blocker and Digoxin. Some GI complaints reported. Will check digoxin level. Plan will be for 6 weeks of anticoagulation followed by repeat TEE/CV. He has an appointment to see Dr. Johney Frame in June. Patient is agreeable to this plan and will call if any problems develop in the interim.

## 2012-01-13 NOTE — Assessment & Plan Note (Signed)
He looks more compensated today. I have cut his Lasix back to just once a day. They will continue with daily weights and salt restriction. Would like to try and get him on some ACE/ARB. I think his blood pressure is too low at the present time.

## 2012-01-13 NOTE — Patient Instructions (Addendum)
We are going to check some labs today.  Cut the Lasix back to just 80 mg each morning.  Avoid salt.  Weigh every day and take an extra half of Lasix if your weight goes up.  Stay on your other medicines.  We are going to get a scan on your chest to see how big your aorta is and why you coughed up blood  Keep your appointment with Dr. Johney Frame in June  Call the [facility] office at [phone] if you have any questions, problems or concerns.

## 2012-01-13 NOTE — Assessment & Plan Note (Signed)
Still with a harsh cough. Had hemoptysis yesterday. Will check CT.

## 2012-01-13 NOTE — Progress Notes (Signed)
Jimmy Myers Date of Birth: 1931/12/13 Medical Record #[id number]  History of Present Illness: Jimmy Myers is seen back today for a one week check. He is seen for Dr. Johney Frame. He has had recent hospitalization earlier this month with pneumonia, CHF and new onset of atrial fib with RVR. Now on coumadin. His TEE showed left atrial appendage thrombus. Plan is for repeat TEE/CV after 6 weeks of adequate anticoagulation. His EF is in the 20 to 25% range. He is on beta blocker and digoxin, primarily for rate control. The tachycardia was felt to be contributing to the low EF. He is NOT on ACE/ARB. His other issues include HTN and anxiety. He had moderate aortic root enlargement on his echo. This has not been investigated further. He has mild to moderate biatrial enlargement. His INR this past Friday was therapeutic.   He comes in today. He is here with his wife. He has had a fairly good week. Not really short of breath but not back to his usual activities yet. Still with a congested cough. Finished his antibiotics. Does report coughing up some blood yesterday. Appetite is poor. He is not having abdominal bloating. He is constipated. Now on some Miralax. No chest pain. No swelling. His weight is coming down. He is avoiding salt.   Current Outpatient Prescriptions on File Prior to Visit  Medication Sig Dispense Refill  . Alum & Mag Hydroxide-Simeth (MAGIC MOUTHWASH) SOLN Take 5 mLs by mouth 4 (four) times daily.  150 mL  1  . clorazepate (TRANXENE) 3.75 MG tablet Take 3.75 mg by mouth daily as needed. For anxiety      . digoxin (LANOXIN) 0.125 MG tablet Take 1 tablet (0.125 mg total) by mouth daily.  30 tablet  0  . guaiFENesin (MUCINEX) 600 MG 12 hr tablet Take 2 tablets (1,200 mg total) by mouth 2 (two) times daily.  14 tablet  0  . metoprolol (LOPRESSOR) 100 MG tablet Take 1 tablet (100 mg total) by mouth 2 (two) times daily.  60 tablet  0  . nitroGLYCERIN (NITROSTAT) 0.4 MG SL tablet Place 1 tablet (0.4 mg  total) under the tongue every 5 (five) minutes x 3 doses as needed for chest pain.  30 tablet  0  . omeprazole (PRILOSEC) 20 MG capsule Take 1 capsule (20 mg total) by mouth daily.  30 capsule  0  . potassium chloride SA (K-DUR,KLOR-CON) 20 MEQ tablet Take 2 tablets (40 mEq total) by mouth daily.  60 tablet  0  . warfarin (COUMADIN) 5 MG tablet Take 1 tablet (5 mg total) by mouth daily. Start taking tomorrow 01/06/12 ,follow your doctor instructions  30 tablet  0  . DISCONTD: furosemide (LASIX) 80 MG tablet Take 1 tablet (80 mg total) by mouth daily.  30 tablet  0    Allergies  Allergen Reactions  . Codeine Other (See Comments)    nervousness  . Simvastatin Other (See Comments)    unknown  . Vicodin (Hydrocodone-Acetaminophen) Other (See Comments)    nervousness    Past Medical History  Diagnosis Date  . Systolic congestive heart failure with reduced left ventricular function, NYHA class 1     EF is 20 to 25% per echo; felt to be due to reversible tachycardia induced CM  . A-fib May 2013    TEE with left atrial appendage thrombus  . Anxiety   . Pneumonia May 2013  . HTN (hypertension)   . Thrombocytopenia   . Chronic anticoagulation  on coumadin - checked in [place]  . Aortic root enlargement     noted on echo    Past Surgical History  Procedure Date  . Knee arthroscopy rt knee  . Prostate surgery   . Tee without cardioversion 12/30/2011    Procedure: TRANSESOPHAGEAL ECHOCARDIOGRAM (TEE);  Surgeon: Lewayne Bunting, MD;  Location: [facility] ENDOSCOPY;  Service: Cardiovascular;  Laterality: N/A;  . Cardioversion 12/30/2011    Procedure: CARDIOVERSION;  Surgeon: Lewayne Bunting, MD;  Location: [facility] ENDOSCOPY;  Service: Cardiovascular;  Laterality: N/A;  . Esophagogastroduodenoscopy 12/30/2011    Procedure: ESOPHAGOGASTRODUODENOSCOPY (EGD);  Surgeon: Lewayne Bunting, MD;  Location: [facility] ENDOSCOPY;  Service: Cardiovascular;  Laterality: N/A;  . Esophagogastroduodenoscopy 12/30/2011     Procedure: ESOPHAGOGASTRODUODENOSCOPY (EGD);  Surgeon: Hart Carwin, MD;  Location: [facility] ENDOSCOPY;  Service: Endoscopy;  Laterality: N/A;    History  Smoking status  . Never Smoker   Smokeless tobacco  . Not on file    History  Alcohol Use No    History reviewed. No pertinent family history.  Review of Systems: The review of systems is positive for cough, hemoptysis and poor appetite.  He is not dizzy or lightheaded. No syncope. No falls reported. All other systems were reviewed and are negative.  Physical Exam: BP 98/60  Pulse 84  Ht 5' 10.5" (1.791 m)  Wt 194 lb (87.998 kg)  BMI 27.44 kg/m2 Patient is very pleasant and in no acute distress. Skin is warm and dry. Color is normal.  HEENT is unremarkable except for several missing teeth. Normocephalic/atraumatic. PERRL. Sclera are nonicteric. Neck is supple. No masses. No JVD. Lungs show decreased breath sounds, few scattered ronchi. He has quite a harsh cough noted. Cardiac exam shows an irregular rhythm. Rate is controlled. Abdomen is soft. Extremities are without edema. Gait and ROM are intact. No gross neurologic deficits noted.    LABORATORY DATA: PENDING  Lab Results  Component Value Date   WBC 7.4 01/06/2012   HGB 12.9* 01/06/2012   HCT 39.0 01/06/2012   PLT 103.0* 01/06/2012   GLUCOSE 126* 01/06/2012   CHOL 85 12/29/2011   TRIG 30 12/29/2011   HDL 40 12/29/2011   LDLCALC 39 12/29/2011   ALT 34 12/29/2011   AST 23 12/29/2011   NA 140 01/06/2012   K 4.2 01/06/2012   CL 103 01/06/2012   CREATININE 1.3 01/06/2012   BUN 31* 01/06/2012   CO2 27 01/06/2012   TSH 0.498 12/28/2011   INR 2.5 01/09/2012     Assessment / Plan:

## 2012-01-14 ENCOUNTER — Ambulatory Visit: Payer: Self-pay | Admitting: Gastroenterology

## 2012-01-16 ENCOUNTER — Telehealth: Payer: Self-pay | Admitting: Nurse Practitioner

## 2012-01-16 ENCOUNTER — Ambulatory Visit (INDEPENDENT_AMBULATORY_CARE_PROVIDER_SITE_OTHER): Payer: Medicare Other | Admitting: *Deleted

## 2012-01-16 DIAGNOSIS — E041 Nontoxic single thyroid nodule: Secondary | ICD-10-CM

## 2012-01-16 DIAGNOSIS — I5189 Other ill-defined heart diseases: Secondary | ICD-10-CM

## 2012-01-16 DIAGNOSIS — I4891 Unspecified atrial fibrillation: Secondary | ICD-10-CM

## 2012-01-16 DIAGNOSIS — I513 Intracardiac thrombosis, not elsewhere classified: Secondary | ICD-10-CM

## 2012-01-16 DIAGNOSIS — Z7901 Long term (current) use of anticoagulants: Secondary | ICD-10-CM

## 2012-01-16 NOTE — Telephone Encounter (Signed)
Will forward to [facility] with Dr Johney Frame

## 2012-01-16 NOTE — Telephone Encounter (Signed)
Spoke with patient and his wife.

## 2012-01-16 NOTE — Telephone Encounter (Signed)
Will set up an ultrasound of his Thyroid and call them back with the appointment date and time.  They are both aware of his labs and CT report

## 2012-01-16 NOTE — Telephone Encounter (Signed)
New msg Pt was calling about blood work and ct results. Please call back

## 2012-01-19 ENCOUNTER — Encounter: Payer: Medicare Other | Admitting: Nurse Practitioner

## 2012-01-21 ENCOUNTER — Ambulatory Visit (HOSPITAL_COMMUNITY)
Admission: RE | Admit: 2012-01-21 | Discharge: 2012-01-21 | Disposition: A | Discharge status: Home / Self Care | Payer: Medicare Other | Source: Ambulatory Visit | Attending: Nurse Practitioner | Admitting: Nurse Practitioner

## 2012-01-21 ENCOUNTER — Telehealth: Payer: Self-pay | Admitting: Nurse Practitioner

## 2012-01-21 DIAGNOSIS — E049 Nontoxic goiter, unspecified: Secondary | ICD-10-CM | POA: Insufficient documentation

## 2012-01-21 DIAGNOSIS — E041 Nontoxic single thyroid nodule: Secondary | ICD-10-CM

## 2012-01-21 NOTE — Telephone Encounter (Signed)
Called patient back and gave him Lori's recommendations  I have left a message with the recommendations for them

## 2012-01-21 NOTE — Telephone Encounter (Signed)
Could just use as needed.

## 2012-01-21 NOTE — Telephone Encounter (Signed)
New msg Pt wants to know should he continue taking mucinex. Please call him back

## 2012-01-22 ENCOUNTER — Other Ambulatory Visit: Payer: Self-pay | Admitting: *Deleted

## 2012-01-22 DIAGNOSIS — E041 Nontoxic single thyroid nodule: Secondary | ICD-10-CM

## 2012-01-23 ENCOUNTER — Ambulatory Visit (INDEPENDENT_AMBULATORY_CARE_PROVIDER_SITE_OTHER): Payer: Medicare Other | Admitting: *Deleted

## 2012-01-23 DIAGNOSIS — I5189 Other ill-defined heart diseases: Secondary | ICD-10-CM

## 2012-01-23 DIAGNOSIS — I4891 Unspecified atrial fibrillation: Secondary | ICD-10-CM

## 2012-01-23 DIAGNOSIS — Z7901 Long term (current) use of anticoagulants: Secondary | ICD-10-CM

## 2012-01-23 DIAGNOSIS — I513 Intracardiac thrombosis, not elsewhere classified: Secondary | ICD-10-CM

## 2012-01-23 LAB — POCT INR: INR: 2.2

## 2012-01-30 ENCOUNTER — Ambulatory Visit (INDEPENDENT_AMBULATORY_CARE_PROVIDER_SITE_OTHER): Payer: Medicare Other | Admitting: *Deleted

## 2012-01-30 DIAGNOSIS — Z7901 Long term (current) use of anticoagulants: Secondary | ICD-10-CM

## 2012-01-30 DIAGNOSIS — I513 Intracardiac thrombosis, not elsewhere classified: Secondary | ICD-10-CM

## 2012-01-30 DIAGNOSIS — I5189 Other ill-defined heart diseases: Secondary | ICD-10-CM

## 2012-01-30 DIAGNOSIS — I4891 Unspecified atrial fibrillation: Secondary | ICD-10-CM

## 2012-02-06 ENCOUNTER — Ambulatory Visit (INDEPENDENT_AMBULATORY_CARE_PROVIDER_SITE_OTHER): Payer: Medicare Other | Admitting: *Deleted

## 2012-02-06 DIAGNOSIS — I513 Intracardiac thrombosis, not elsewhere classified: Secondary | ICD-10-CM

## 2012-02-06 DIAGNOSIS — Z7901 Long term (current) use of anticoagulants: Secondary | ICD-10-CM

## 2012-02-06 DIAGNOSIS — I5189 Other ill-defined heart diseases: Secondary | ICD-10-CM

## 2012-02-06 DIAGNOSIS — I4891 Unspecified atrial fibrillation: Secondary | ICD-10-CM

## 2012-02-11 ENCOUNTER — Ambulatory Visit (INDEPENDENT_AMBULATORY_CARE_PROVIDER_SITE_OTHER): Payer: Medicare Other | Admitting: Internal Medicine

## 2012-02-11 ENCOUNTER — Encounter: Payer: Self-pay | Admitting: Internal Medicine

## 2012-02-11 VITALS — BP 130/68 | HR 80 | Resp 18 | Ht 70.0 in | Wt 196.0 lb

## 2012-02-11 DIAGNOSIS — I5189 Other ill-defined heart diseases: Secondary | ICD-10-CM

## 2012-02-11 DIAGNOSIS — I5023 Acute on chronic systolic (congestive) heart failure: Secondary | ICD-10-CM

## 2012-02-11 DIAGNOSIS — I10 Essential (primary) hypertension: Secondary | ICD-10-CM

## 2012-02-11 DIAGNOSIS — I7789 Other specified disorders of arteries and arterioles: Secondary | ICD-10-CM

## 2012-02-11 DIAGNOSIS — I513 Intracardiac thrombosis, not elsewhere classified: Secondary | ICD-10-CM

## 2012-02-11 DIAGNOSIS — I4891 Unspecified atrial fibrillation: Secondary | ICD-10-CM

## 2012-02-11 MED ORDER — LISINOPRIL 5 MG PO TABS: 5.0000 mg | ORAL_TABLET | Freq: Every day | ORAL | Status: DC

## 2012-02-11 NOTE — Patient Instructions (Addendum)
Your physician recommends that you schedule a follow-up appointment in: 4-5 weeks with Sunday Spillers  Your physician has requested that you have a TEE/Cardioversion. During a TEE, sound waves are used to create images of your heart. It provides your doctor with information about the size and shape of your heart and how well your heart's chambers and valves are working. In this test, a transducer is attached to the end of a flexible tube that is guided down you throat and into your esophagus (the tube leading from your mouth to your stomach) to get a more detailed image of your heart. Once the TEE has determined that a blood clot is not present, the cardioversion begins. Electrical Cardioversion uses a jolt of electricity to your heart either through paddles or wired patches attached to your chest. This is a controlled, usually prescheduled, procedure. This procedure is done at the hospital and you are not awake during the procedure. You usually go home the day of the procedure. Please see the instruction sheet given to you today for more information.  Your physician has recommended you make the following change in your medication:  1) Start Lisinopril 5mg  daily  Your physician recommends that you return for lab work today BMP/CBC/INR/MAG

## 2012-02-12 ENCOUNTER — Other Ambulatory Visit: Payer: Self-pay | Admitting: *Deleted

## 2012-02-12 ENCOUNTER — Encounter: Payer: Self-pay | Admitting: *Deleted

## 2012-02-12 DIAGNOSIS — I4891 Unspecified atrial fibrillation: Secondary | ICD-10-CM

## 2012-02-12 LAB — CBC WITH DIFFERENTIAL/PLATELET
Basophils Absolute: 0 10*3/uL (ref 0.0–0.1)
Eosinophils Absolute: 0 10*3/uL (ref 0.0–0.7)
HCT: 35.3 % — ABNORMAL LOW (ref 39.0–52.0)
Lymphs Abs: 1.5 10*3/uL (ref 0.7–4.0)
MCHC: 33.3 g/dL (ref 30.0–36.0)
MCV: 95.6 fl (ref 78.0–100.0)
Monocytes Absolute: 0.5 10*3/uL (ref 0.1–1.0)
Neutro Abs: 3.1 10*3/uL (ref 1.4–7.7)
Platelets: 96 10*3/uL — ABNORMAL LOW (ref 150.0–400.0)
RDW: 16.5 % — ABNORMAL HIGH (ref 11.5–14.6)

## 2012-02-12 LAB — BASIC METABOLIC PANEL
BUN: 24 mg/dL — ABNORMAL HIGH (ref 6–23)
CO2: 29 mEq/L (ref 19–32)
GFR: 58.85 mL/min — ABNORMAL LOW (ref >60.00)
Glucose, Bld: 95 mg/dL (ref 70–99)
Potassium: 4.3 mEq/L (ref 3.5–5.1)

## 2012-02-12 LAB — PROTIME-INR: Prothrombin Time: 36.6 s — ABNORMAL HIGH (ref 10.2–12.4)

## 2012-02-13 ENCOUNTER — Telehealth: Payer: Self-pay | Admitting: Internal Medicine

## 2012-02-13 ENCOUNTER — Ambulatory Visit (INDEPENDENT_AMBULATORY_CARE_PROVIDER_SITE_OTHER): Payer: Medicare Other | Admitting: *Deleted

## 2012-02-13 DIAGNOSIS — I5189 Other ill-defined heart diseases: Secondary | ICD-10-CM

## 2012-02-13 DIAGNOSIS — Z7901 Long term (current) use of anticoagulants: Secondary | ICD-10-CM

## 2012-02-13 DIAGNOSIS — I513 Intracardiac thrombosis, not elsewhere classified: Secondary | ICD-10-CM

## 2012-02-13 DIAGNOSIS — I4891 Unspecified atrial fibrillation: Secondary | ICD-10-CM

## 2012-02-13 NOTE — Telephone Encounter (Signed)
lmom for pt to start Lisinopril 5mg  daily and the only medication he will hold on the day of his TEE/DCCV is his Furosemide

## 2012-02-13 NOTE — Telephone Encounter (Signed)
New msg Pt wants to talk to you about lisinopril. He is confused about which med he should take. He also wants a copy of test results

## 2012-02-16 ENCOUNTER — Encounter (HOSPITAL_COMMUNITY): Payer: Self-pay | Admitting: Anesthesiology

## 2012-02-16 ENCOUNTER — Ambulatory Visit (HOSPITAL_COMMUNITY)
Admission: RE | Admit: 2012-02-16 | Discharge: 2012-02-16 | Disposition: A | Discharge status: Home / Self Care | Payer: Medicare Other | Source: Ambulatory Visit | Attending: Cardiology | Admitting: Cardiology

## 2012-02-16 ENCOUNTER — Ambulatory Visit (HOSPITAL_COMMUNITY): Payer: Medicare Other | Admitting: Anesthesiology

## 2012-02-16 ENCOUNTER — Encounter (HOSPITAL_COMMUNITY)
Admission: RE | Disposition: A | Discharge status: Home / Self Care | Payer: Self-pay | Source: Ambulatory Visit | Attending: Cardiology

## 2012-02-16 ENCOUNTER — Encounter (HOSPITAL_COMMUNITY): Payer: Self-pay | Admitting: *Deleted

## 2012-02-16 DIAGNOSIS — I4891 Unspecified atrial fibrillation: Secondary | ICD-10-CM

## 2012-02-16 HISTORY — PX: TEE WITHOUT CARDIOVERSION: SHX5443

## 2012-02-16 HISTORY — DX: Bronchitis, not specified as acute or chronic: J40

## 2012-02-16 HISTORY — PX: CARDIOVERSION: SHX1299

## 2012-02-16 SURGERY — ECHOCARDIOGRAM, TRANSESOPHAGEAL
Anesthesia: Moderate Sedation

## 2012-02-16 MED ORDER — MIDAZOLAM HCL 10 MG/2ML IJ SOLN
INTRAMUSCULAR | Status: DC | PRN
  Administered 2012-02-16 (×2): 2 mg via INTRAVENOUS

## 2012-02-16 MED ORDER — SODIUM CHLORIDE 0.9 % IJ SOLN: 3.0000 mL | Freq: Two times a day (BID) | INTRAMUSCULAR | Status: DC

## 2012-02-16 MED ORDER — SODIUM CHLORIDE 0.9 % IJ SOLN: 3.0000 mL | INTRAMUSCULAR | Status: DC | PRN

## 2012-02-16 MED ORDER — FENTANYL CITRATE 0.05 MG/ML IJ SOLN: 250.0000 ug | Freq: Once | INTRAMUSCULAR | Status: DC

## 2012-02-16 MED ORDER — SODIUM CHLORIDE 0.9 % IV SOLN: 250.0000 mL | INTRAVENOUS | Status: DC | PRN

## 2012-02-16 MED ORDER — FENTANYL CITRATE 0.05 MG/ML IJ SOLN
INTRAMUSCULAR | Status: DC | PRN
  Administered 2012-02-16 (×2): 25 ug via INTRAVENOUS

## 2012-02-16 MED ORDER — LIDOCAINE HCL (CARDIAC) 20 MG/ML IV SOLN
INTRAVENOUS | Status: DC | PRN
  Administered 2012-02-16: 40 mg via INTRAVENOUS

## 2012-02-16 MED ORDER — SODIUM CHLORIDE 0.45 % IV SOLN
INTRAVENOUS | Status: DC
  Administered 2012-02-16 (×2): via INTRAVENOUS

## 2012-02-16 MED ORDER — MIDAZOLAM HCL 10 MG/2ML IJ SOLN
INTRAMUSCULAR | Status: AC
  Filled 2012-02-16: qty 2

## 2012-02-16 MED ORDER — PROPOFOL 10 MG/ML IV EMUL
INTRAVENOUS | Status: DC | PRN
  Administered 2012-02-16: 80 mg via INTRAVENOUS

## 2012-02-16 MED ORDER — BUTAMBEN-TETRACAINE-BENZOCAINE 2-2-14 % EX AERO
INHALATION_SPRAY | CUTANEOUS | Status: DC | PRN
  Administered 2012-02-16: 2 via TOPICAL

## 2012-02-16 MED ORDER — BENZOCAINE 20 % MT SOLN: 1.0000 "application " | OROMUCOSAL | Status: DC | PRN

## 2012-02-16 MED ORDER — FENTANYL CITRATE 0.05 MG/ML IJ SOLN
INTRAMUSCULAR | Status: AC
  Filled 2012-02-16: qty 2

## 2012-02-16 MED ORDER — MIDAZOLAM HCL 10 MG/2ML IJ SOLN: 10.0000 mg | Freq: Once | INTRAMUSCULAR | Status: DC

## 2012-02-16 MED ORDER — SODIUM CHLORIDE 0.9 % IV SOLN
INTRAVENOUS | Status: DC | PRN
  Administered 2012-02-16: 13:00:00 via INTRAVENOUS

## 2012-02-16 NOTE — Anesthesia Preprocedure Evaluation (Signed)
Anesthesia Evaluation  Patient identified by MRN, date of birth, ID band Patient awake    Reviewed: Allergy & Precautions, H&P , NPO status , Patient's Chart, lab work & pertinent test results  Airway Mallampati: II  Neck ROM: full    Dental   Pulmonary          Cardiovascular hypertension, +CHF + dysrhythmias Atrial Fibrillation     Neuro/Psych Anxiety    GI/Hepatic   Endo/Other    Renal/GU      Musculoskeletal   Abdominal   Peds  Hematology   Anesthesia Other Findings   Reproductive/Obstetrics                           Anesthesia Physical Anesthesia Plan  ASA: III  Anesthesia Plan: MAC   Post-op Pain Management:    Induction: Intravenous  Airway Management Planned: Mask  Additional Equipment:   Intra-op Plan:   Post-operative Plan:   Informed Consent: I have reviewed the patients History and Physical, chart, labs and discussed the procedure including the risks, benefits and alternatives for the proposed anesthesia with the patient or authorized representative who has indicated his/her understanding and acceptance.     Plan Discussed with: CRNA and Surgeon  Anesthesia Plan Comments:         Anesthesia Quick Evaluation

## 2012-02-16 NOTE — CV Procedure (Signed)
See TEE report in [employer]; no LAA identified; patient subsequently sedated by anesthesia with diprovan 80 mg IV; synchronized DCCV with 120 joules resulted in sinus bradycardia; continue coumadin for at least 4 weeks uninterrupted; FU in coumadin clinic and with Dr Johney Frame. Note severely dilated aortic root on TEE. Will need FU as out patient. Olga Millers

## 2012-02-16 NOTE — Discharge Instructions (Addendum)
Electrical Cardioversion Cardioversion is the delivery of a jolt of electricity to change the rhythm of the heart. Sticky patches or metal paddles are placed on the chest to deliver the electricity from a special device. This is done to restore a normal rhythm. A rhythm that is too fast or not regular keeps the heart from pumping well. Compared to medicines used to change an abnormal rhythm, cardioversion is faster and works better. It is also unpleasant and may dislodge blood clots from the heart. WHEN WOULD THIS BE DONE?  In an emergency:   There is low or no blood pressure as a result of the heart rhythm.   Normal rhythm must be restored as fast as possible to protect the brain and heart from further damage.   It may save a life.   For less serious heart rhythms, such as atrial fibrillation or flutter, in which:   The heart is beating too fast or is not regular.   The heart is still able to pump enough blood, but not as well as it should.   Medicine to change the rhythm has not worked.   It is safe to wait in order to allow time for preparation.  LET YOUR CAREGIVER KNOW ABOUT:   Every medicine you are taking. It is very important to do this! Know when to take or stop taking any of them.   Any time in the past that you have felt your heart was not beating normally.  RISKS AND COMPLICATIONS   Clots may form in the chambers of the heart if it is beating too fast. These clots may be dislodged during the procedure and travel to other parts of the body.   There is risk of a stroke during and after the procedure if a clot moves. Blood thinners lower this risk.   You may have a special test of your heart (TEE) to make sure there are no clots in your heart.  BEFORE THE PROCEDURE   You may have some tests to see how well your heart is working.   You may start taking blood thinners so your blood does not clot as easily.   Other drugs may be given to help your heart work better.   PROCEDURE (SCHEDULED)  The procedure is typically done in a hospital by a heart doctor (cardiologist).   You will be told when and where to go.   You may be given some medicine through an intravenous (IV) access to reduce discomfort and make you sleepy before the procedure.   Your whole body may move when the shock is delivered. Your chest may feel sore.   You may be able to go home after a few hours. Your heart rhythm will be watched to make sure it does not change.  HOME CARE INSTRUCTIONS   Only take medicine as directed by your caregiver. Be sure you understand how and when to take your medicine.   Learn how to feel your pulse and check it often.   Limit your activity for 48 hours.   Avoid caffeine and other stimulants as directed.  SEEK MEDICAL CARE IF:   You feel like your heart is beating too fast or your pulse is not regular.   You have any questions about your medicines.   You have bleeding that will not stop.  SEEK IMMEDIATE MEDICAL CARE IF:   You are dizzy or feel faint.   It is hard to breathe or you feel short of breath.     There is a change in discomfort in your chest.   Your speech is slurred or you have trouble moving your arm or leg on one side.   You get a muscle cramp.   Your fingers or toes turn cold or blue.  MAKE SURE YOU:   Understand these instructions.   Will watch your condition.   Will get help right away if you are not doing well or get worse.  Document Released: 08/08/2002 Document Revised: 08/07/2011 Document Reviewed: 12/08/2007 ExitCare Patient Information 2012 ExitCare, LLCTransesophageal Echocardiography A transesophageal echocardiogram (TEE) is a special type of test that produces images of the heart by sound waves (echocardiogram). This type of echocardiogram can obtain better images of the heart than a standard echocardiogram. A TEE is done by passing a flexible tube down the esophagus. The heart is located in front of the  esophagus. Because the heart and esophagus are close to one another, your caregiver can take very clear, detailed pictures of the heart via ultrasound waves. WHY HAVE A TEE? Your caregiver may need more information based on your medical condition. A TEE is usually performed due to the following:  Your caregiver needs more information based on standard echocardiogram findings.   If you had a stroke, this might have happened because a clot formed in your heart. A TEE can visualize different areas of the heart and check for clots.   To check valve anatomy and function. Your caregiver will especially look at the mitral valve.   To check for redness, soreness, and swelling (inflammation) on the inside lining of the heart (endocarditis).   To evaluate the dividing wall (septum) of the heart and presence of a hole that did not close after birth (patent foramen ovale, PFO).   To help diagnose a tear in the wall of the aorta (aortic dissection).   During cardiac valve surgery, a TEE probe is placed. This allows the surgeon to assess the valve repair before closing the chest.  LET YOUR CAREGIVER KNOW ABOUT:   Swallowing difficulties.   An esophageal obstruction.   Use of aspirin or antiplatelet therapy.  RISKS AND COMPLICATIONS  Though extremely rare, an esophageal tear (rupture) is a potential complication. BEFORE THE PROCEDURE   Arrive at least 1 hour before the procedure or as told by your caregiver.   Do not eat or drink for 6 hours before the procedure or as told by your caregiver.   An intravenous (IV) access tube will be started in the arm.  PROCEDURE   A medicine to help you relax (sedative) will be given through the IV.   A medicine that numbs the area (local anesthetic) may be sprayed to the back of the throat.   Your blood pressure, heart rate, and breathing (vital signs) will be monitored during the procedure.   The TEE probe is a long, flexible tube. It is about the width  of an adult male's index finger. The tip of the probe is placed into the back of the mouth and you will be asked to swallow. This helps to pass the tip of the probe into the esophagus. Once the tip of the probe is in the correct area, your caregiver can take pictures of the heart.   A TEE is usually not a painful procedure. You may feel the probe press against the back of the throat. The probe does not enter the trachea and does not affect your breathing.   Your time spent at the hospital is usually  less than 2 hours.  AFTER THE PROCEDURE   You will be in bed, resting until you have fully returned to consciousness.   When you first awaken, your throat may feel slightly sore and will probably still feel numb. This will improve slowly over time.   You will not be allowed to eat or drink until it is clear that numbness has improved.   Once you have been able to drink, urinate, and sit on the edge of the bed without feeling sick to your stomach (nauseous) or dizzy, you may be cleared to dress and go home.   Do not drive yourself home. You have had medications that can continue to make you feel drowsy and can impair your reflexes.   You should have a friend or family member with you for the next 24 hours after your examination.  Obtaining the test results It is your responsibility to obtain your test results. Ask the lab or department performing the test when and how you will get your results. SEEK IMMEDIATE MEDICAL CARE IF:   There is chest pain.   You have a hard time breathing or have shortness of breath.   You cough or throw up (vomit) blood.  MAKE SURE YOU:   Understand these instructions.   Will watch this condition.   Will get help right away if you is not doing well or gets worse.  Document Released: 11/08/2002 Document Revised: 08/07/2011 Document Reviewed: 01/30/2009 [facility] Patient Information 2012 [place], [place].Marland Kitchen

## 2012-02-16 NOTE — Transfer of Care (Signed)
Immediate Anesthesia Transfer of Care Note  Patient: Jimmy Myers  Procedure(s) Performed: Procedure(s) (LRB): TRANSESOPHAGEAL ECHOCARDIOGRAM (TEE) (N/A) CARDIOVERSION (N/A)  Patient Location: PACU  Anesthesia Type: MAC  Level of Consciousness: sedated and patient cooperative  Airway & Oxygen Therapy: Patient Spontanous Breathing and Patient connected to nasal cannula oxygen  Post-op Assessment: Report given to PACU RN and Post -op Vital signs reviewed and stable  Post vital signs: Reviewed and stable  Complications: No apparent anesthesia complications

## 2012-02-16 NOTE — H&P (Signed)
Jimmy Myers   01/06/2012 3:00 PM Office Visit  MRN: [id number]   Description: 78 year old male  Provider: Norma Fredrickson, NP  Department: [facility]        Referring Provider     Josue Hector, MD      Diagnoses     Systolic heart failure   - Primary    428.2    Systolic dysfunction     429.9    Aortic root enlargement     447.8      Reason for Visit     Cough    Work in visit. Seen for Dr. Johney Frame.     Shortness of Breath         Progress Notes     Norma Fredrickson, NP  01/06/2012  4:55 PM  Signed    [name] Date of Birth: 25-Nov-1931 Medical Record #[id number]   History of Present Illness: Jimmy Myers is seen today for a work in visit. He is here with his wife and son. He was just discharged from the hospital yesterday. He has new onset of atrial fib with RVR. Now on coumadin. TEE showed left atrial appendage thrombus. He has systolic heart failure and his EF is in the 20 to 25% range. He is on beta blocker, ACE, Digoxin and diuretics. He had some pneumonia as well as his heart failure and did receive antibiotics during that stay. It is felt that his atrial fib/tachycardia is resulting in the weakness of his heart muscle. He is to have repeat TEE with CV after 6 weeks of adequate anticoagulation.    He comes in today. He is here with his wife and son. The son called earlier. They got worried about the patient and wanted to get him "checked out". Thought he may be more short of breath. Still coughing. No fever or chills. Do not have scales at home. Hasn't been home long enough to really eat and denies extra salt. His cough is productive. Says his sputum looks milky and is thick. He is on Mucinex. No inhalers. He is to see Dr. Lysbeth Galas tomorrow. No swelling reported. He does feel a little weak. No chest pain. He did take an extra dose of Lasix prior to his arrival here this afternoon and had a CXR.     Allergies   Allergen  Reactions   .  Codeine  Other (See  Comments)       nervousness   .  Simvastatin  Other (See Comments)       unknown   .  Vicodin (Hydrocodone-Acetaminophen)  Other (See Comments)       nervousness    CURRENT MEDICINES: clorazepate 3.75 MG tablet      Commonly known as: TRANXENE      Take 3.75 mg by mouth daily as needed. For anxiety          digoxin 0.125 MG tablet      Commonly known as: LANOXIN      Take 1 tablet (0.125 mg total) by mouth daily.          furosemide 80 MG tablet      Commonly known as: LASIX      Take 1 tablet (80 mg total) by mouth daily.          guaiFENesin 600 MG 12 hr tablet      Commonly known as: MUCINEX      Take 2 tablets (1,200 mg total) by mouth 2 (two)  times daily.          metoprolol 100 MG tablet      Commonly known as: LOPRESSOR      Take 1 tablet (100 mg total) by mouth 2 (two) times daily.          nitroGLYCERIN 0.4 MG SL tablet      Commonly known as: NITROSTAT      Place 1 tablet (0.4 mg total) under the tongue every 5 (five) minutes x 3 doses as needed for chest pain.          omeprazole 20 MG capsule      Commonly known as: PRILOSEC      Take 1 capsule (20 mg total) by mouth daily.          polyethylene glycol packet      Commonly known as: MIRALAX / GLYCOLAX      Take 17 g by mouth daily as needed.          potassium chloride SA 20 MEQ tablet      Commonly known as: K-DUR,KLOR-CON      Take 2 tablets (40 mEq total) by mouth daily.          warfarin 5 MG tablet      Commonly known as: COUMADIN      Take 1 tablet (5 mg total) by mouth daily. Start taking tomorrow 01/07/12 ,follow your doctor instructions              Past Medical History   Diagnosis  Date   .  CHF (congestive heart failure)     .  A-fib     .  Anxiety         Past Surgical History   Procedure  Date   .  Knee arthroscopy  rt knee   .  Prostate surgery     .  Tee without cardioversion  12/30/2011       Procedure: TRANSESOPHAGEAL ECHOCARDIOGRAM (TEE);  Surgeon: Lewayne Bunting, MD;  Location: [facility] ENDOSCOPY;  Service: Cardiovascular;  Laterality: N/A;   .  Cardioversion  12/30/2011       Procedure: CARDIOVERSION;  Surgeon: Lewayne Bunting, MD;  Location: [facility] ENDOSCOPY;  Service: Cardiovascular;  Laterality: N/A;   .  Esophagogastroduodenoscopy  12/30/2011       Procedure: ESOPHAGOGASTRODUODENOSCOPY (EGD);  Surgeon: Lewayne Bunting, MD;  Location: [facility] ENDOSCOPY;  Service: Cardiovascular;  Laterality: N/A;   .  Esophagogastroduodenoscopy  12/30/2011       Procedure: ESOPHAGOGASTRODUODENOSCOPY (EGD);  Surgeon: Hart Carwin, MD;  Location: [facility] ENDOSCOPY;  Service: Endoscopy;  Laterality: N/A;       History   Smoking status   .  Never Smoker    Smokeless tobacco   .  Not on file       History   Alcohol Use  No      No family history on file.   Review of Systems: The review of systems is positive for dyspnea and cough.  All other systems were reviewed and are negative.   Physical Exam: BP 98/68  Pulse 100  Ht 5' 10.5" (1.791 m)  Wt 201 lb (91.173 kg)  BMI 28.43 kg/m2  SpO2 96% Patient is very pleasant and in no acute distress. Skin is warm and dry. Color is normal.  HEENT is unremarkable but he has several missing teeth. Normocephalic/atraumatic. PERRL. Sclera are nonicteric. Neck is supple. No masses. No JVD. Lungs show some diffuse rales and  ronchi. He has some clearing with coughing. Cardiac exam shows an irregular rhythm. He is a little tachycardic today. Abdomen is soft. Extremities are without edema. Gait and ROM are intact. No gross neurologic deficits noted.     LABORATORY DATA: Lab Results   Component  Value  Date     WBC  7.4  01/05/2012     HGB  12.1*  01/05/2012     HCT  36.6*  01/05/2012     PLT  89*  01/05/2012     GLUCOSE  96  01/05/2012     CHOL  85  12/29/2011     TRIG  30  12/29/2011     HDL  40  12/29/2011     LDLCALC  39  12/29/2011     ALT  34  12/29/2011     AST  23  12/29/2011     NA  140  01/05/2012     K  4.4  01/05/2012     CL   105  01/05/2012     CREATININE  1.09  01/05/2012     BUN  33*  01/05/2012     CO2  26  01/05/2012     TSH  0.498  12/28/2011     INR  1.8  01/06/2012      ECHO results 12/2011 - Left ventricle: The cavity size was dilated. Systolic function was severely reduced. The estimated ejection fraction was in the range of 20% to 25%. Diffuse hypokinesis. - Aortic valve: Mild regurgitation. - Aortic root: The aortic root was moderately dilated. - Mitral valve: Moderate regurgitation. - Left atrium: The atrium was moderately dilated. Left atrial appendage with severe spontaneous contrast and thrombus noted at tip. Emptying velocity was reduced. - Right ventricle: The cavity size was mildly dilated. Systolic function was moderately reduced. - Right atrium: The atrium was mildly dilated. Impressions:  - Initial attempt at probe insertion met some resistance; Dr Juanda Chance of GI performed endoscopy and found no significant obstruction. Omniplane probe then passed without difficulty. Patient with LAA thrombus; recommend 4-6 weeks of therapeutic coumadin and then repeat TEE to RO LAA thrombus prior to DCCV.   CXR IMPRESSION (01/06/2012): Cardiomegaly without edema. Small pleural effusions.  Original Report Authenticated By: Donavan Burnet, M.D.     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 I suspect this is multifactorial (cardiac and pulmonary related). Was just discharged one day ago. Has known LV dysfunction. CXR with no pneumonia today. His lungs do lear with coughing. I have increased his Lasix for the next 2 days to 80 mg in the am and 40 mg in the pm, then back to his 80 mg daily. Will give him another round of antibiotics with Levaquin 500 mg daily. He will be getting his INR checked on Friday. Family is getting scales to  start daily weights. I am also rechecking his labs today as well. I will see him back in a week. Will plan for repeat TEE with CV after 6 weeks of adequate anticoagulation. He is also seeing his PCP tomorrow. Patient is agreeable to this plan and will call if any problems develop in the interim.      Aortic root enlargement - Norma Fredrickson, NP  01/12/2012  3:31 PM  Signed Noted to have aortic  root enlargement on his echo. I asked Dr. Johney Frame to review. Will plan on checking ultrasound of the aorta per Dr. Jenel Lucks recommendation. Patient has follow up visit and will be informed.      Patient seen and examined; no significant changes from above office note; patient with recent admission for CHF and pneumonia; has cardiomyopathy and atrial fibrillation; for TEE/DCCV today; INR has been therapeutic. No changes.

## 2012-02-17 ENCOUNTER — Encounter (HOSPITAL_COMMUNITY): Payer: Self-pay | Admitting: Cardiology

## 2012-02-17 NOTE — Anesthesia Postprocedure Evaluation (Addendum)
Anesthesia Post Note  Patient: Jimmy Myers  Procedure(s) Performed: Procedure(s) (LRB): TRANSESOPHAGEAL ECHOCARDIOGRAM (TEE) (N/A) CARDIOVERSION (N/A)  Anesthesia type: MAC  Patient location: PACU  Post pain: Pain level controlled and Adequate analgesia  Post assessment: Post-op Vital signs reviewed, Patient's Cardiovascular Status Stable and Respiratory Function Stable  Last Vitals:  Filed Vitals:   02/16/12 1420  BP: 113/62  Pulse:   Temp:   Resp: 25    Post vital signs: Reviewed and stable  Level of consciousness: awake, alert  and oriented  Complications: No apparent anesthesia complications

## 2012-02-18 ENCOUNTER — Telehealth: Payer: Self-pay | Admitting: Internal Medicine

## 2012-02-18 NOTE — Telephone Encounter (Signed)
Pt was put on Lisinopril when he was in the hospital but the instruction tells him he should not take it if he is on certain meds that he is on and he was wondering what time he needs to take it

## 2012-02-18 NOTE — Telephone Encounter (Signed)
Answered his questions and he will call back if he has more

## 2012-02-20 ENCOUNTER — Ambulatory Visit (INDEPENDENT_AMBULATORY_CARE_PROVIDER_SITE_OTHER): Payer: Medicare Other | Admitting: *Deleted

## 2012-02-20 DIAGNOSIS — I513 Intracardiac thrombosis, not elsewhere classified: Secondary | ICD-10-CM

## 2012-02-20 DIAGNOSIS — Z7901 Long term (current) use of anticoagulants: Secondary | ICD-10-CM

## 2012-02-20 DIAGNOSIS — I4891 Unspecified atrial fibrillation: Secondary | ICD-10-CM

## 2012-02-20 DIAGNOSIS — I5189 Other ill-defined heart diseases: Secondary | ICD-10-CM

## 2012-02-22 ENCOUNTER — Encounter: Payer: Self-pay | Admitting: Internal Medicine

## 2012-02-22 NOTE — Progress Notes (Signed)
PCP:  Josue Hector, MD  Also followed at the [facility] by Marga Hoots PA  The patient presents today for routine electrophysiology followup.  Since last being seen in our clinic, the patient reports doing reasonably well.  INRs have been therapeutic.  He has fatigue.  He has stable edema but denies SOB.   Today, he denies symptoms of palpitations, chest pain,  orthopnea, PND,  dizziness, presyncope, syncope, or neurologic sequela.  The patient feels that he is tolerating medications without difficulties and is otherwise without complaint today.   Past Medical History  Diagnosis Date  . Systolic congestive heart failure with reduced left ventricular function, NYHA class 1     EF is 20 to 25% per echo; felt to be due to reversible tachycardia induced CM  . A-fib May 2013  . Anxiety   . Pneumonia May 2013  . HTN (hypertension)   . Thrombocytopenia   . Chronic anticoagulation     on coumadin - checked in [place]  . Aortic root enlargement     at least 5cm by CT  . Bronchitis    Past Surgical History  Procedure Date  . Knee arthroscopy rt knee  . Prostate surgery   . Tee without cardioversion 12/30/2011    Procedure: TRANSESOPHAGEAL ECHOCARDIOGRAM (TEE);  Surgeon: Lewayne Bunting, MD;  Location: [facility] ENDOSCOPY;  Service: Cardiovascular;  Laterality: N/A;  . Cardioversion 12/30/2011    Procedure: CARDIOVERSION;  Surgeon: Lewayne Bunting, MD;  Location: [facility] ENDOSCOPY;  Service: Cardiovascular;  Laterality: N/A;  . Esophagogastroduodenoscopy 12/30/2011    Procedure: ESOPHAGOGASTRODUODENOSCOPY (EGD);  Surgeon: Lewayne Bunting, MD;  Location: [facility] ENDOSCOPY;  Service: Cardiovascular;  Laterality: N/A;  . Esophagogastroduodenoscopy 12/30/2011    Procedure: ESOPHAGOGASTRODUODENOSCOPY (EGD);  Surgeon: Hart Carwin, MD;  Location: [facility] ENDOSCOPY;  Service: Endoscopy;  Laterality: N/A;  . Tee without cardioversion 02/16/2012    Procedure: TRANSESOPHAGEAL ECHOCARDIOGRAM (TEE);  Surgeon: Lewayne Bunting, MD;  Location: [facility] ENDOSCOPY;  Service: Cardiovascular;  Laterality: N/A;  . Cardioversion 02/16/2012    Procedure: CARDIOVERSION;  Surgeon: Lewayne Bunting, MD;  Location: [facility] ENDOSCOPY;  Service: Cardiovascular;  Laterality: N/A;    Current Outpatient Prescriptions  Medication Sig Dispense Refill  . Alum & Mag Hydroxide-Simeth (MAGIC MOUTHWASH) SOLN Take 5 mLs by mouth 4 (four) times daily.  150 mL  1  . clorazepate (TRANXENE) 3.75 MG tablet Take 3.75 mg by mouth daily as needed. For anxiety      . digoxin (LANOXIN) 0.125 MG tablet Take 1 tablet (0.125 mg total) by mouth daily.  30 tablet  0  . furosemide (LASIX) 80 MG tablet Take 1 tablet (80 mg total) by mouth daily.      . metoprolol (LOPRESSOR) 100 MG tablet Take 1 tablet (100 mg total) by mouth 2 (two) times daily.  60 tablet  0  . nitroGLYCERIN (NITROSTAT) 0.4 MG SL tablet Place 1 tablet (0.4 mg total) under the tongue every 5 (five) minutes x 3 doses as needed for chest pain.  30 tablet  0  . omeprazole (PRILOSEC) 20 MG capsule Take 1 capsule (20 mg total) by mouth daily.  30 capsule  0  . potassium chloride SA (K-DUR,KLOR-CON) 20 MEQ tablet Take 2 tablets (40 mEq total) by mouth daily.  60 tablet  0  . warfarin (COUMADIN) 5 MG tablet Take 1 tablet (5 mg total) by mouth daily. Start taking tomorrow 01/06/12 ,follow your doctor instructions  30 tablet  0  .  guaiFENesin (MUCINEX) 600 MG 12 hr tablet Take 2 tablets (1,200 mg total) by mouth 2 (two) times daily.  14 tablet  0  . lisinopril (PRINIVIL,ZESTRIL) 5 MG tablet Take 1 tablet (5 mg total) by mouth daily.  90 tablet  3  . polyethylene glycol (MIRALAX / GLYCOLAX) packet Take 17 g by mouth daily.        Allergies  Allergen Reactions  . Codeine Other (See Comments)    nervousness  . Simvastatin Other (See Comments)    unknown  . Vicodin (Hydrocodone-Acetaminophen) Other (See Comments)    nervousness    History   Social History  . Marital Status: Married    Spouse  Name: N/A    Number of Children: N/A  . Years of Education: N/A   Occupational History  . Not on file.   Social History Main Topics  . Smoking status: Never Smoker   . Smokeless tobacco: Not on file  . Alcohol Use: No  . Drug Use: No  . Sexually Active: Not Currently   Other Topics Concern  . Not on file   Social History Narrative  . No narrative on file   ROS-  All systems are reviewed and are negative except as outlined in the HPI above  Physical Exam: Filed Vitals:   02/11/12 1622  BP: 130/68  Pulse: 80  Resp: 18  Height: 5\' 10"  (1.778 m)  Weight: 196 lb (88.905 kg)    GEN- The patient is well appearing, alert and oriented x 3 today.   Head- normocephalic, atraumatic Eyes-  Sclera clear, conjunctiva pink Ears- hearing intact Oropharynx- clear Neck- supple, no JVP Lymph- no cervical lymphadenopathy Lungs- Clear to ausculation bilaterally, normal work of breathing Heart- irregular rate and rhythm, no murmurs, rubs or gallops, PMI not laterally displaced GI- soft, NT, ND, + BS Extremities- no clubbing, cyanosis, +1 bilateral edema MS- no significant deformity or atrophy Skin- no rash or lesion Psych- euthymic mood, full affect Neuro- strength and sensation are intact  ekg today reveals afib, V rate 80 bpm, nonspecific ST/T changes, PVC  Assessment and Plan:

## 2012-02-22 NOTE — Assessment & Plan Note (Signed)
Continue long term anticoagulation with coumadin 

## 2012-02-22 NOTE — Assessment & Plan Note (Signed)
INR has been therapeutic for more than 4 weeks. Given prior LAA thrombus, we will proceed with repeat TEE and then cardioversion if no thrombus is present. Risks, benefits, and alternatives to the procedure were discussed at length with the patient who wishes to proceed. Continue current medical management in the interim.  He will likely require an antiarrhythmic medicine (either tikosyn or amiodarone) if his afib returns.

## 2012-02-22 NOTE — Assessment & Plan Note (Signed)
Slowly improving Salt restriction  Add lisinopril 5mg  daily bmet today  Continue to titrate medical therapy as BP allows Repeat echo after in sinus rhythm for 6 weeks.

## 2012-02-22 NOTE — Assessment & Plan Note (Signed)
Add lisinopril as above

## 2012-02-22 NOTE — Assessment & Plan Note (Signed)
At least 5cm by CT Will need referral to cardiovascular surgery once thyroid biopsy is performed.

## 2012-02-25 ENCOUNTER — Telehealth: Payer: Self-pay | Admitting: Internal Medicine

## 2012-02-25 NOTE — Telephone Encounter (Signed)
Pt having problem with BP 114/53 pulse 48 , pls call

## 2012-02-25 NOTE — Telephone Encounter (Signed)
Spoke with his wife and he was concerned in regards to his HR.  IT was 48 today.  She said he was not dizzy and was not having any problems.  She will have him call me back if needed  They are going to the doctor tomorrow to have his Thyroid checked and if they need to speak with [place] they will call back

## 2012-02-26 ENCOUNTER — Telehealth: Payer: Self-pay | Admitting: *Deleted

## 2012-02-26 ENCOUNTER — Ambulatory Visit (INDEPENDENT_AMBULATORY_CARE_PROVIDER_SITE_OTHER): Payer: Medicare Other | Admitting: General Surgery

## 2012-02-26 ENCOUNTER — Encounter (INDEPENDENT_AMBULATORY_CARE_PROVIDER_SITE_OTHER): Payer: Self-pay | Admitting: General Surgery

## 2012-02-26 VITALS — BP 116/62 | HR 45 | Temp 97.4°F | Ht 70.5 in | Wt 197.4 lb

## 2012-02-26 DIAGNOSIS — I4891 Unspecified atrial fibrillation: Secondary | ICD-10-CM

## 2012-02-26 DIAGNOSIS — E049 Nontoxic goiter, unspecified: Secondary | ICD-10-CM | POA: Insufficient documentation

## 2012-02-26 DIAGNOSIS — E042 Nontoxic multinodular goiter: Secondary | ICD-10-CM

## 2012-02-26 MED ORDER — METOPROLOL TARTRATE 100 MG PO TABS: 50.0000 mg | ORAL_TABLET | Freq: Two times a day (BID) | ORAL | Status: DC

## 2012-02-26 NOTE — Telephone Encounter (Signed)
Pt walked into clinic c/o being tired with a HR of 48  Dr Johney Frame addressed We are going to decrease his Metoprolol to 50mg  twice daily and Stop Digoxin

## 2012-02-26 NOTE — Patient Instructions (Signed)
We will see you and re-evaluate the thyroid nodules in 3 months.

## 2012-02-26 NOTE — Progress Notes (Signed)
Patient ID: Jimmy Myers, male   DOB: [birth year], 79 y.o.   MRN: [id number]  Chief Complaint  Patient presents with  . Pre-op Exam    eval thyroid nodule    HPI Jimmy Myers is a 78 y.o. male.   HPI  He is referred by Dr. Hillis Range for evaluation of thyroid nodules. He underwent a CT scan of the chest because he is having hemoptysis and to evaluate his aortic arch. At that time he is noted to have right and left thyroid nodules. An ultrasound of the neck was performed and demonstrated 2.8 cm solid nodule on the right and a 4.2 cm solid nodule on the left. He does have mild dysphagia but no compression symptoms. He is here for evaluation of  the thyroid nodules.  Past Medical History  Diagnosis Date  . Systolic congestive heart failure with reduced left ventricular function, NYHA class 1     EF is 20 to 25% per echo; felt to be due to reversible tachycardia induced CM  . A-fib May 2013  . Anxiety   . Pneumonia May 2013  . HTN (hypertension)   . Thrombocytopenia   . Chronic anticoagulation     on coumadin - checked in [place]  . Aortic root enlargement     at least 5cm by CT  . Bronchitis   . Atrial thrombus     Past Surgical History  Procedure Date  . Knee arthroscopy rt knee  . Prostate surgery   . Tee without cardioversion 12/30/2011    Procedure: TRANSESOPHAGEAL ECHOCARDIOGRAM (TEE);  Surgeon: Lewayne Bunting, MD;  Location: [facility] ENDOSCOPY;  Service: Cardiovascular;  Laterality: N/A;  . Cardioversion 12/30/2011    Procedure: CARDIOVERSION;  Surgeon: Lewayne Bunting, MD;  Location: [facility] ENDOSCOPY;  Service: Cardiovascular;  Laterality: N/A;  . Esophagogastroduodenoscopy 12/30/2011    Procedure: ESOPHAGOGASTRODUODENOSCOPY (EGD);  Surgeon: Lewayne Bunting, MD;  Location: [facility] ENDOSCOPY;  Service: Cardiovascular;  Laterality: N/A;  . Esophagogastroduodenoscopy 12/30/2011    Procedure: ESOPHAGOGASTRODUODENOSCOPY (EGD);  Surgeon: Hart Carwin, MD;  Location: [facility] ENDOSCOPY;  Service:  Endoscopy;  Laterality: N/A;  . Tee without cardioversion 02/16/2012    Procedure: TRANSESOPHAGEAL ECHOCARDIOGRAM (TEE);  Surgeon: Lewayne Bunting, MD;  Location: [facility] ENDOSCOPY;  Service: Cardiovascular;  Laterality: N/A;  . Cardioversion 02/16/2012    Procedure: CARDIOVERSION;  Surgeon: Lewayne Bunting, MD;  Location: [facility] ENDOSCOPY;  Service: Cardiovascular;  Laterality: N/A;    Family History  Problem Relation Age of Onset  . Cancer Brother     stomach    Social History History  Substance Use Topics  . Smoking status: Never Smoker   . Smokeless tobacco: Not on file  . Alcohol Use: No    Allergies  Allergen Reactions  . Codeine Other (See Comments)    nervousness  . Simvastatin Other (See Comments)    unknown  . Vicodin (Hydrocodone-Acetaminophen) Other (See Comments)    nervousness    Current Outpatient Prescriptions  Medication Sig Dispense Refill  . Alum & Mag Hydroxide-Simeth (MAGIC MOUTHWASH) SOLN Take 5 mLs by mouth 4 (four) times daily.  150 mL  1  . clorazepate (TRANXENE) 3.75 MG tablet Take 3.75 mg by mouth daily as needed. For anxiety      . furosemide (LASIX) 80 MG tablet Take 1 tablet (80 mg total) by mouth daily.      Marland Kitchen guaiFENesin (MUCINEX) 600 MG 12 hr tablet Take 2 tablets (1,200 mg total) by mouth 2 (two)  times daily.  14 tablet  0  . lisinopril (PRINIVIL,ZESTRIL) 5 MG tablet Take 1 tablet (5 mg total) by mouth daily.  90 tablet  3  . nitroGLYCERIN (NITROSTAT) 0.4 MG SL tablet Place 1 tablet (0.4 mg total) under the tongue every 5 (five) minutes x 3 doses as needed for chest pain.  30 tablet  0  . omeprazole (PRILOSEC) 20 MG capsule Take 1 capsule (20 mg total) by mouth daily.  30 capsule  0  . polyethylene glycol (MIRALAX / GLYCOLAX) packet Take 17 g by mouth daily.      . potassium chloride SA (K-DUR,KLOR-CON) 20 MEQ tablet Take 2 tablets (40 mEq total) by mouth daily.  60 tablet  0  . warfarin (COUMADIN) 5 MG tablet Take 1 tablet (5 mg total) by mouth  daily. Start taking tomorrow 01/06/12 ,follow your doctor instructions  30 tablet  0  . DISCONTD: metoprolol (LOPRESSOR) 100 MG tablet Take 1 tablet (100 mg total) by mouth 2 (two) times daily.  60 tablet  0  . metoprolol (LOPRESSOR) 100 MG tablet Take 0.5 tablets (50 mg total) by mouth 2 (two) times daily.  60 tablet  0    Review of Systems Review of Systems  HENT: Positive for hearing loss.   Respiratory: Positive for shortness of breath.   Cardiovascular: Negative for chest pain.  Gastrointestinal: Negative for nausea, abdominal pain, diarrhea and anal bleeding.  Genitourinary: Negative for dysuria.  Neurological: Positive for weakness.    Blood pressure 116/62, pulse 45, temperature 97.4 F (36.3 C), temperature source Temporal, height 5' 10.5" (1.791 m), weight 197 lb 6.4 oz (89.54 kg), SpO2 97.00%.  Physical Exam Physical Exam  Constitutional:       Elderly male in NAD.  HENT:  Head: Normocephalic and atraumatic.  Eyes: EOM are normal. No scleral icterus.  Neck: Neck supple. No tracheal deviation present. Thyromegaly (Palpable nodules bilaterally with the left nodule larger than the right nodule) present.  Cardiovascular:       Decreased rate.  Lymphadenopathy:    He has no cervical adenopathy.  Neurological: He is alert.  Skin: Skin is warm and dry.    Data Reviewed [place], CT scan, Cardiology notes.  Assessment    1. Multiple thyroid nodules found incidentally on CT   2. Congestive heart failure, recent atrial fibrillation, atrial thrombus, on oral anticoagulation therapy.    Plan    Typically, I would recommend fine-needle aspiration of these thyroid nodules. However given his acute cardiac issues and recent anticoagulation therapy I would hold off on any fine-needle aspiration these nodules until he becomes an operative candidate in case one and he shows something that requires thyroidectomy. Given his cardiac function he may not be a good operative candidate so I  think these can just be monitored. Plan return visit in 3 months.       Ezekeil Bethel J 02/26/2012, 10:52 PM

## 2012-02-27 ENCOUNTER — Ambulatory Visit (INDEPENDENT_AMBULATORY_CARE_PROVIDER_SITE_OTHER): Payer: Medicare Other | Admitting: *Deleted

## 2012-02-27 DIAGNOSIS — I4891 Unspecified atrial fibrillation: Secondary | ICD-10-CM

## 2012-02-27 DIAGNOSIS — I5189 Other ill-defined heart diseases: Secondary | ICD-10-CM

## 2012-02-27 DIAGNOSIS — Z7901 Long term (current) use of anticoagulants: Secondary | ICD-10-CM

## 2012-02-27 DIAGNOSIS — I513 Intracardiac thrombosis, not elsewhere classified: Secondary | ICD-10-CM

## 2012-03-05 ENCOUNTER — Ambulatory Visit (INDEPENDENT_AMBULATORY_CARE_PROVIDER_SITE_OTHER): Payer: Medicare Other | Admitting: *Deleted

## 2012-03-05 DIAGNOSIS — Z7901 Long term (current) use of anticoagulants: Secondary | ICD-10-CM

## 2012-03-05 DIAGNOSIS — I513 Intracardiac thrombosis, not elsewhere classified: Secondary | ICD-10-CM

## 2012-03-05 DIAGNOSIS — I4891 Unspecified atrial fibrillation: Secondary | ICD-10-CM

## 2012-03-05 DIAGNOSIS — I5189 Other ill-defined heart diseases: Secondary | ICD-10-CM

## 2012-03-10 ENCOUNTER — Encounter: Payer: Self-pay | Admitting: Nurse Practitioner

## 2012-03-10 ENCOUNTER — Ambulatory Visit (INDEPENDENT_AMBULATORY_CARE_PROVIDER_SITE_OTHER): Payer: Medicare Other | Admitting: Nurse Practitioner

## 2012-03-10 VITALS — BP 112/54 | HR 48 | Ht 70.0 in | Wt 197.4 lb

## 2012-03-10 DIAGNOSIS — I10 Essential (primary) hypertension: Secondary | ICD-10-CM

## 2012-03-10 DIAGNOSIS — I7789 Other specified disorders of arteries and arterioles: Secondary | ICD-10-CM

## 2012-03-10 DIAGNOSIS — R001 Bradycardia, unspecified: Secondary | ICD-10-CM

## 2012-03-10 DIAGNOSIS — I498 Other specified cardiac arrhythmias: Secondary | ICD-10-CM

## 2012-03-10 DIAGNOSIS — E042 Nontoxic multinodular goiter: Secondary | ICD-10-CM

## 2012-03-10 DIAGNOSIS — I4891 Unspecified atrial fibrillation: Secondary | ICD-10-CM

## 2012-03-10 DIAGNOSIS — I5023 Acute on chronic systolic (congestive) heart failure: Secondary | ICD-10-CM

## 2012-03-10 LAB — CBC WITH DIFFERENTIAL/PLATELET
Basophils Absolute: 0 10*3/uL (ref 0.0–0.1)
Basophils Relative: 0.4 % (ref 0.0–3.0)
Eosinophils Absolute: 0 10*3/uL (ref 0.0–0.7)
Eosinophils Relative: 1 % (ref 0.0–5.0)
HCT: 32.7 % — ABNORMAL LOW (ref 39.0–52.0)
Hemoglobin: 11 g/dL — ABNORMAL LOW (ref 13.0–17.0)
Lymphocytes Relative: 28.4 % (ref 12.0–46.0)
Lymphs Abs: 1.2 10*3/uL (ref 0.7–4.0)
MCHC: 33.8 g/dL (ref 30.0–36.0)
MCV: 96 fl (ref 78.0–100.0)
Monocytes Absolute: 0.7 10*3/uL (ref 0.1–1.0)
Monocytes Relative: 15.2 % — ABNORMAL HIGH (ref 3.0–12.0)
Neutro Abs: 2.4 10*3/uL (ref 1.4–7.7)
Neutrophils Relative %: 55 % (ref 43.0–77.0)
Platelets: 98 10*3/uL — ABNORMAL LOW (ref 150.0–400.0)
RBC: 3.4 Mil/uL — ABNORMAL LOW (ref 4.22–5.81)
RDW: 15.9 % — ABNORMAL HIGH (ref 11.5–14.6)
WBC: 4.3 10*3/uL — ABNORMAL LOW (ref 4.5–10.5)

## 2012-03-10 LAB — BASIC METABOLIC PANEL
BUN: 24 mg/dL — ABNORMAL HIGH (ref 6–23)
CO2: 29 mEq/L (ref 19–32)
Calcium: 9 mg/dL (ref 8.4–10.5)
Chloride: 100 mEq/L (ref 96–112)
Creatinine, Ser: 1.1 mg/dL (ref 0.4–1.5)
GFR: 69.55 mL/min (ref >60.00)
Glucose, Bld: 87 mg/dL (ref 70–99)
Potassium: 4.5 mEq/L (ref 3.5–5.1)
Sodium: 138 mEq/L (ref 135–145)

## 2012-03-10 MED ORDER — METOPROLOL TARTRATE 25 MG PO TABS: 25.0000 mg | ORAL_TABLET | Freq: Two times a day (BID) | ORAL | Status: DC

## 2012-03-10 NOTE — Assessment & Plan Note (Addendum)
Will ask [facility] to review his study and refer when indicated. He is not really interested in surgical intervention but does understand that he is at risk for rupture.   March 15, 2012 I have had his scan reviewed by Dr. Tyrone Sage. He has advised a repeat scan with a consult visit in 6 months. I will let Mr. & Mrs. Doenges know.

## 2012-03-10 NOTE — Assessment & Plan Note (Addendum)
He remains in sinus post cardioversion. Rate remains slow. Already had his Digoxin stopped and BB cut back. I am cutting the Lopressor back further today to 25 mg BID. He will continue to monitor at home. It is felt that he will require antiarrythmic therapy if he has recurrence. He will need long term anticoagulation.

## 2012-03-10 NOTE — Assessment & Plan Note (Signed)
BP looks good and he has good control at home.

## 2012-03-10 NOTE — Assessment & Plan Note (Signed)
He looks good from a heart failure standpoint and seems stronger to me. I suspect some of his weakness is more related to his bradycardia. We will be rechecking an echo in about 4 weeks. Hopefully we will see improvement with restoration of sinus rhythm. He is on low dose ACE as well. Will see him back with Dr. Johney Frame following the echo.

## 2012-03-10 NOTE — Progress Notes (Signed)
Lonni Myers Date of Birth: 1932-07-05 Medical Record #[id number]  History of Present Illness: Mr. Jimmy Myers is seen back today for a post hospital visit. He is seen for Dr. Johney Frame. He has systolic heart failure with an EF of 20 to 25%, felt to be due to tachycardia induced with his atrial fib. Has just had cardioversion about 2 weeks ago. He was apparently bradycardic post procedure and has had his digoxin stopped and metoprolol was decreased to 50 mg BID. He is now on ACE. Other problems include an enlarged ascending aorta, thyroid nodules, and HTN. He has seen Dr. Abbey Chatters of General Surgery for his thyroid. He was going to hold off on biopsy until his cardiac status stabilized a little more and will be seeing him back in 3 months. He has not been referred yet to [facility].  He comes in today. He is here with his wife. He says he has felt better since the cardioversion but remains weak. He is concerned over his heart rate. It is staying in the 40 and low 50's at home. BP has done fine. No chest pain. Not a lot of energy. No palpitations. He is worried about his labs. Worried about taking potassium. No chest pain. Not short of breath. No swelling/PND/orthopnea. He is tolerating his medicines. He is asking about his aorta. Doesn't really want "to keep going to doctors". His wife reports an incident earlier this week in which his right leg went numb while driving. It lasted for about 3 to 5 minutes. He did have to stop the car and get out and rub his leg. He was then fine and has not had recurrence. INR has been therapeutic.   Current Outpatient Prescriptions on File Prior to Visit  Medication Sig Dispense Refill  . Alum & Mag Hydroxide-Simeth (MAGIC MOUTHWASH) SOLN Take 5 mLs by mouth 4 (four) times daily.  150 mL  1  . clorazepate (TRANXENE) 3.75 MG tablet Take 3.75 mg by mouth daily as needed. For anxiety      . furosemide (LASIX) 80 MG tablet Take 1 tablet (80 mg total) by mouth daily.      Marland Kitchen guaiFENesin  (MUCINEX) 600 MG 12 hr tablet Take 2 tablets (1,200 mg total) by mouth 2 (two) times daily.  14 tablet  0  . lisinopril (PRINIVIL,ZESTRIL) 5 MG tablet Take 1 tablet (5 mg total) by mouth daily.  90 tablet  3  . omeprazole (PRILOSEC) 20 MG capsule Take 1 capsule (20 mg total) by mouth daily.  30 capsule  0  . polyethylene glycol (MIRALAX / GLYCOLAX) packet Take 17 g by mouth daily.      . potassium chloride SA (K-DUR,KLOR-CON) 20 MEQ tablet Take 2 tablets (40 mEq total) by mouth daily.  60 tablet  0  . warfarin (COUMADIN) 5 MG tablet Take 1 tablet (5 mg total) by mouth daily. Start taking tomorrow 01/06/12 ,follow your doctor instructions  30 tablet  0  . nitroGLYCERIN (NITROSTAT) 0.4 MG SL tablet Place 1 tablet (0.4 mg total) under the tongue every 5 (five) minutes x 3 doses as needed for chest pain.  30 tablet  0    Allergies  Allergen Reactions  . Codeine Other (See Comments)    nervousness  . Simvastatin Other (See Comments)    unknown  . Vicodin (Hydrocodone-Acetaminophen) Other (See Comments)    nervousness    Past Medical History  Diagnosis Date  . Systolic congestive heart failure with reduced left ventricular function, NYHA  class 1     EF is 20 to 25% per echo; felt to be due to reversible tachycardia induced CM  . A-fib May 2013    s/p TEE/cardioversion June 2013  . Anxiety   . Pneumonia May 2013  . HTN (hypertension)   . Thrombocytopenia   . Chronic anticoagulation     on coumadin - checked in [place]  . Aortic root enlargement     at least 5cm by CT  . Bronchitis   . Atrial thrombus   . Bradycardia     Past Surgical History  Procedure Date  . Knee arthroscopy rt knee  . Prostate surgery   . Tee without cardioversion 12/30/2011    Procedure: TRANSESOPHAGEAL ECHOCARDIOGRAM (TEE);  Surgeon: Lewayne Bunting, MD;  Location: [facility] ENDOSCOPY;  Service: Cardiovascular;  Laterality: N/A;  . Cardioversion 12/30/2011    Procedure: CARDIOVERSION;  Surgeon: Lewayne Bunting, MD;   Location: [facility] ENDOSCOPY;  Service: Cardiovascular;  Laterality: N/A;  . Esophagogastroduodenoscopy 12/30/2011    Procedure: ESOPHAGOGASTRODUODENOSCOPY (EGD);  Surgeon: Lewayne Bunting, MD;  Location: [facility] ENDOSCOPY;  Service: Cardiovascular;  Laterality: N/A;  . Esophagogastroduodenoscopy 12/30/2011    Procedure: ESOPHAGOGASTRODUODENOSCOPY (EGD);  Surgeon: Hart Carwin, MD;  Location: [facility] ENDOSCOPY;  Service: Endoscopy;  Laterality: N/A;  . Tee without cardioversion 02/16/2012    Procedure: TRANSESOPHAGEAL ECHOCARDIOGRAM (TEE);  Surgeon: Lewayne Bunting, MD;  Location: [facility] ENDOSCOPY;  Service: Cardiovascular;  Laterality: N/A;  . Cardioversion 02/16/2012    Procedure: CARDIOVERSION;  Surgeon: Lewayne Bunting, MD;  Location: [facility] ENDOSCOPY;  Service: Cardiovascular;  Laterality: N/A;    History  Smoking status  . Never Smoker   Smokeless tobacco  . Not on file    History  Alcohol Use No    Family History  Problem Relation Age of Onset  . Cancer Brother     stomach    Review of Systems: The review of systems is per the HPI.  All other systems were reviewed and are negative.  Physical Exam: BP 112/54  Pulse 48  Ht 5\' 10"  (1.778 m)  Wt 197 lb 6.4 oz (89.54 kg)  BMI 28.32 kg/m2 Patient is very pleasant and in no acute distress. He looks pretty good overall. Skin is warm and dry. Color is normal.  HEENT is unremarkable. Normocephalic/atraumatic. PERRL. Sclera are nonicteric. Neck is supple. No masses. No JVD. Lungs are clear. Cardiac exam shows a regular rate and rhythm. Rate is slow. Abdomen is soft. Extremities are without edema. Pulses are 2+ in his feet. Gait and ROM are intact. No gross neurologic deficits noted.   LABORATORY DATA: EKG today shows sinus bradycardia. Rate is 47.   Lab Results  Component Value Date   WBC 5.2 02/11/2012   HGB 11.8* 02/11/2012   HCT 35.3* 02/11/2012   PLT 96.0* 02/11/2012   GLUCOSE 95 02/11/2012   CHOL 85 12/29/2011   TRIG 30 12/29/2011   HDL 40  12/29/2011   LDLCALC 39 12/29/2011   ALT 34 12/29/2011   AST 23 12/29/2011   NA 139 02/11/2012   K 4.3 02/11/2012   CL 101 02/11/2012   CREATININE 1.3 02/11/2012   BUN 24* 02/11/2012   CO2 29 02/11/2012   TSH 0.498 12/28/2011   INR 2.8 03/05/2012    Lab Results  Component Value Date   INR 2.8 03/05/2012   INR 2.2 02/27/2012   INR 2.3 02/20/2012    Assessment / Plan:

## 2012-03-10 NOTE — Patient Instructions (Addendum)
We are cutting the Lopressor back to just 25 mg two times a day. I have sent this prescription to the drug store  Monitor your heart rate at home  We are going to check labs today  We will see you in a month and have a repeat ultrasound of your heart  We need to recheck some labs today  Let us know if you have any more numbness of your legs or anywhere elsse  Call the [facility] office at [phone] if you have any questions, problems or concerns.

## 2012-03-10 NOTE — Assessment & Plan Note (Signed)
Has seen General Surgery. Plans for biopsy on hold until seen back in September.

## 2012-03-12 ENCOUNTER — Ambulatory Visit (INDEPENDENT_AMBULATORY_CARE_PROVIDER_SITE_OTHER): Payer: Medicare Other | Admitting: *Deleted

## 2012-03-12 DIAGNOSIS — I5189 Other ill-defined heart diseases: Secondary | ICD-10-CM

## 2012-03-12 DIAGNOSIS — Z7901 Long term (current) use of anticoagulants: Secondary | ICD-10-CM

## 2012-03-12 DIAGNOSIS — I4891 Unspecified atrial fibrillation: Secondary | ICD-10-CM

## 2012-03-12 DIAGNOSIS — I513 Intracardiac thrombosis, not elsewhere classified: Secondary | ICD-10-CM

## 2012-03-15 NOTE — Addendum Note (Signed)
Addended by: Rosalio Macadamia on: 03/15/2012 09:50 AM   Modules accepted: Orders

## 2012-03-22 ENCOUNTER — Other Ambulatory Visit: Payer: Self-pay | Admitting: Internal Medicine

## 2012-03-23 ENCOUNTER — Other Ambulatory Visit: Payer: Self-pay

## 2012-03-23 MED ORDER — WARFARIN SODIUM 5 MG PO TABS: 5.0000 mg | ORAL_TABLET | Freq: Every day | ORAL | Status: DC

## 2012-03-23 MED ORDER — FUROSEMIDE 80 MG PO TABS: 80.0000 mg | ORAL_TABLET | Freq: Every day | ORAL | Status: DC

## 2012-03-23 NOTE — Telephone Encounter (Signed)
..   Requested Prescriptions   Signed Prescriptions Disp Refills  . furosemide (LASIX) 80 MG tablet 90 tablet 3    Sig: Take 1 tablet (80 mg total) by mouth daily.    Authorizing Provider: Rosalio Macadamia    Ordering User: Christella Hartigan, Inice Sanluis Judie Petit

## 2012-03-24 ENCOUNTER — Telehealth: Payer: Self-pay | Admitting: Internal Medicine

## 2012-03-24 NOTE — Telephone Encounter (Signed)
Pt needs refill, now out, said pharmacy faxed request,  [facility] mayodan, furosimide and coumadin  90 day supply with refills

## 2012-03-25 ENCOUNTER — Telehealth: Payer: Self-pay | Admitting: Internal Medicine

## 2012-03-25 MED ORDER — FUROSEMIDE 80 MG PO TABS: 80.0000 mg | ORAL_TABLET | Freq: Every day | ORAL | Status: DC

## 2012-03-25 MED ORDER — WARFARIN SODIUM 5 MG PO TABS: 5.0000 mg | ORAL_TABLET | Freq: Every day | ORAL | Status: DC

## 2012-03-25 NOTE — Telephone Encounter (Signed)
New Problem:    Patient called in requesting a refill of his warfarin (COUMADIN) 5 MG tablet.  Please call once the order has been placed.

## 2012-03-25 NOTE — Telephone Encounter (Signed)
Rx sent on previous phone note.  Pt's wife aware.

## 2012-03-26 ENCOUNTER — Ambulatory Visit (INDEPENDENT_AMBULATORY_CARE_PROVIDER_SITE_OTHER): Payer: Medicare Other | Admitting: *Deleted

## 2012-03-26 ENCOUNTER — Other Ambulatory Visit: Payer: Self-pay | Admitting: *Deleted

## 2012-03-26 DIAGNOSIS — I5189 Other ill-defined heart diseases: Secondary | ICD-10-CM

## 2012-03-26 DIAGNOSIS — Z7901 Long term (current) use of anticoagulants: Secondary | ICD-10-CM

## 2012-03-26 DIAGNOSIS — I513 Intracardiac thrombosis, not elsewhere classified: Secondary | ICD-10-CM

## 2012-03-26 DIAGNOSIS — I4891 Unspecified atrial fibrillation: Secondary | ICD-10-CM

## 2012-03-26 LAB — POCT INR: INR: 2.2

## 2012-03-26 MED ORDER — WARFARIN SODIUM 5 MG PO TABS: 5.0000 mg | ORAL_TABLET | Freq: Every day | ORAL | Status: DC

## 2012-03-26 NOTE — Telephone Encounter (Signed)
Warfarin refilled at todays INR visit

## 2012-03-26 NOTE — Telephone Encounter (Signed)
Patient needs refill of Warfarin 5mg  takes as directed.

## 2012-04-05 ENCOUNTER — Ambulatory Visit (HOSPITAL_COMMUNITY): Payer: Medicare Other | Attending: Cardiology

## 2012-04-05 DIAGNOSIS — I509 Heart failure, unspecified: Secondary | ICD-10-CM

## 2012-04-05 DIAGNOSIS — R001 Bradycardia, unspecified: Secondary | ICD-10-CM

## 2012-04-05 DIAGNOSIS — I359 Nonrheumatic aortic valve disorder, unspecified: Secondary | ICD-10-CM | POA: Insufficient documentation

## 2012-04-05 DIAGNOSIS — I4891 Unspecified atrial fibrillation: Secondary | ICD-10-CM | POA: Insufficient documentation

## 2012-04-05 DIAGNOSIS — I498 Other specified cardiac arrhythmias: Secondary | ICD-10-CM | POA: Insufficient documentation

## 2012-04-05 DIAGNOSIS — I10 Essential (primary) hypertension: Secondary | ICD-10-CM | POA: Insufficient documentation

## 2012-04-05 NOTE — Progress Notes (Signed)
Echocardiogram performed.  

## 2012-04-08 ENCOUNTER — Telehealth: Payer: Self-pay | Admitting: Nurse Practitioner

## 2012-04-08 ENCOUNTER — Encounter: Payer: Self-pay | Admitting: Nurse Practitioner

## 2012-04-08 ENCOUNTER — Ambulatory Visit (INDEPENDENT_AMBULATORY_CARE_PROVIDER_SITE_OTHER): Payer: Medicare Other | Admitting: Nurse Practitioner

## 2012-04-08 ENCOUNTER — Encounter: Payer: Self-pay | Admitting: *Deleted

## 2012-04-08 VITALS — BP 110/56 | HR 46 | Ht 70.0 in | Wt 198.0 lb

## 2012-04-08 DIAGNOSIS — E042 Nontoxic multinodular goiter: Secondary | ICD-10-CM

## 2012-04-08 DIAGNOSIS — I7789 Other specified disorders of arteries and arterioles: Secondary | ICD-10-CM

## 2012-04-08 DIAGNOSIS — I10 Essential (primary) hypertension: Secondary | ICD-10-CM

## 2012-04-08 DIAGNOSIS — I4891 Unspecified atrial fibrillation: Secondary | ICD-10-CM

## 2012-04-08 DIAGNOSIS — I519 Heart disease, unspecified: Secondary | ICD-10-CM

## 2012-04-08 NOTE — Assessment & Plan Note (Signed)
He remains in sinus by exam. Heart rates are ok at home. He is asymptomatic. He is committed to long term coumadin. He will need antiarrhythmic therapy if he has recurrence.

## 2012-04-08 NOTE — Telephone Encounter (Signed)
Fu call °Pt returning your call  °

## 2012-04-08 NOTE — Assessment & Plan Note (Signed)
BP is well controlled with his current regimen. No change in current therapy.

## 2012-04-08 NOTE — Assessment & Plan Note (Signed)
He is for repeat CT scanning in early December and has a visit planned with Dr. Tyrone Sage. He says he is not really interested in surgical repair but will "listen to what they say".

## 2012-04-08 NOTE — Patient Instructions (Addendum)
Please get CT angio of the chest scheduled for early December  Stay on your current medicines  Monitor your blood pressure and heart rate at home. Let us know if you have any dizzy or passing out spells  Stay active.  We will see you back in 3 months.  Call the [facility] office at [phone] if you have any questions, problems or concerns.

## 2012-04-08 NOTE — Assessment & Plan Note (Signed)
Seeing Dr. Abbey Chatters next month for consideration of biopsy.

## 2012-04-08 NOTE — Assessment & Plan Note (Signed)
His EF has improved to 50% per recent echo. He has no CHF symptoms. Will continue with his current regimen.

## 2012-04-08 NOTE — Progress Notes (Signed)
Jimmy Myers Date of Birth: [birth year] Medical Record #[id number]  History of Present Illness: Mr. Heidenreich is seen back today for a follow up visit. He is seen for Dr. Johney Frame. He has systolic heart failure with an EF down to 20 to 25%, felt to be tachycardia-induced with his atrial fib. Has had cardioversion. Was bradycardic after that procedure and at his last visit. Medicines have been adjusted. His other problems include an enlarged ascending aorta, thyroid nodules and HTN.   He comes in today. He is here with his wife. Feeling pretty good and says he is getting stronger. His EF has improved and those results are shared with him today. He has no CHF symptoms. Heart rate is staying in the low to mid 50's at home. He is not dizzy and has had no passing out spells. He is trying to get more active.   Current Outpatient Prescriptions on File Prior to Visit  Medication Sig Dispense Refill  . clorazepate (TRANXENE) 3.75 MG tablet Take 3.75 mg by mouth daily as needed. For anxiety      . furosemide (LASIX) 80 MG tablet Take 1 tablet (80 mg total) by mouth daily.  90 tablet  3  . lisinopril (PRINIVIL,ZESTRIL) 5 MG tablet Take 1 tablet (5 mg total) by mouth daily.  90 tablet  3  . metoprolol tartrate (LOPRESSOR) 25 MG tablet Take 1 tablet (25 mg total) by mouth 2 (two) times daily.  60 tablet  11  . nitroGLYCERIN (NITROSTAT) 0.4 MG SL tablet Place 1 tablet (0.4 mg total) under the tongue every 5 (five) minutes x 3 doses as needed for chest pain.  30 tablet  0  . omeprazole (PRILOSEC) 20 MG capsule Take 1 capsule (20 mg total) by mouth daily.  30 capsule  0  . polyethylene glycol (MIRALAX / GLYCOLAX) packet Take 17 g by mouth daily.      . potassium chloride SA (K-DUR,KLOR-CON) 20 MEQ tablet Take 2 tablets (40 mEq total) by mouth daily.  60 tablet  0  . warfarin (COUMADIN) 5 MG tablet Take 1 tablet (5 mg total) by mouth daily. Take 1 tablet daily except 1 1/2 tablets on Fridays  90 tablet  3  . DISCONTD:  warfarin (COUMADIN) 5 MG tablet Take 1 tablet (5 mg total) by mouth daily. Start taking tomorrow 01/06/12 ,follow your doctor instructions  100 tablet  0    Allergies  Allergen Reactions  . Codeine Other (See Comments)    nervousness  . Simvastatin Other (See Comments)    unknown  . Vicodin (Hydrocodone-Acetaminophen) Other (See Comments)    nervousness    Past Medical History  Diagnosis Date  . Systolic congestive heart failure with reduced left ventricular function, NYHA class 1     EF is 20 to 25% per echo; felt to be due to reversible tachycardia induced CM  . A-fib May 2013    s/p TEE/cardioversion June 2013  . Anxiety   . Pneumonia May 2013  . HTN (hypertension)   . Thrombocytopenia   . Chronic anticoagulation     on coumadin - checked in [place]  . Aortic root enlargement     at least 5cm by CT  . Bronchitis   . Atrial thrombus   . Bradycardia     Past Surgical History  Procedure Date  . Knee arthroscopy rt knee  . Prostate surgery   . Tee without cardioversion 12/30/2011    Procedure: TRANSESOPHAGEAL ECHOCARDIOGRAM (TEE);  Surgeon: Arlys John  Ludwig Clarks, MD;  Location: [facility] ENDOSCOPY;  Service: Cardiovascular;  Laterality: N/A;  . Cardioversion 12/30/2011    Procedure: CARDIOVERSION;  Surgeon: Lewayne Bunting, MD;  Location: [facility] ENDOSCOPY;  Service: Cardiovascular;  Laterality: N/A;  . Esophagogastroduodenoscopy 12/30/2011    Procedure: ESOPHAGOGASTRODUODENOSCOPY (EGD);  Surgeon: Lewayne Bunting, MD;  Location: [facility] ENDOSCOPY;  Service: Cardiovascular;  Laterality: N/A;  . Esophagogastroduodenoscopy 12/30/2011    Procedure: ESOPHAGOGASTRODUODENOSCOPY (EGD);  Surgeon: Hart Carwin, MD;  Location: [facility] ENDOSCOPY;  Service: Endoscopy;  Laterality: N/A;  . Tee without cardioversion 02/16/2012    Procedure: TRANSESOPHAGEAL ECHOCARDIOGRAM (TEE);  Surgeon: Lewayne Bunting, MD;  Location: [facility] ENDOSCOPY;  Service: Cardiovascular;  Laterality: N/A;  . Cardioversion 02/16/2012    Procedure:  CARDIOVERSION;  Surgeon: Lewayne Bunting, MD;  Location: [facility] ENDOSCOPY;  Service: Cardiovascular;  Laterality: N/A;    History  Smoking status  . Never Smoker   Smokeless tobacco  . Not on file    History  Alcohol Use No    Family History  Problem Relation Age of Onset  . Cancer Brother     stomach    Review of Systems: The review of systems is per the HPI.  All other systems were reviewed and are negative.  Physical Exam: BP 110/56  Pulse 46  Ht 5\' 10"  (1.778 m)  Wt 198 lb (89.812 kg)  BMI 28.41 kg/m2 Patient is very pleasant and in no acute distress. Skin is warm and dry. Color is normal.  HEENT is unremarkable. Normocephalic/atraumatic. PERRL. Sclera are nonicteric. Neck is supple. No masses. No JVD. Lungs are clear. Cardiac exam shows a regular rate and rhythm.His rate is 56 by me.  Abdomen is soft. Extremities are without edema. Gait and ROM are intact. No gross neurologic deficits noted.   LABORATORY DATA:  Echo Study Conclusions from July 2013  - Left ventricle: The cavity size was mildly dilated. Wall thickness was increased in a pattern of mild LVH. The estimated ejection fraction was 50%. Diffuse hypokinesis. - Aortic valve: Sclerosis without stenosis. Mild regurgitation. - Aorta: The aortic root and ascending aorta are dilated at 50mm. - Left atrium: The atrium was mildly dilated. - Pulmonary arteries: PA peak pressure: 31mm Hg (S).  Lab Results  Component Value Date   WBC 4.3* 03/10/2012   HGB 11.0* 03/10/2012   HCT 32.7* 03/10/2012   PLT 98.0* 03/10/2012   GLUCOSE 87 03/10/2012   CHOL 85 12/29/2011   TRIG 30 12/29/2011   HDL 40 12/29/2011   LDLCALC 39 12/29/2011   ALT 34 12/29/2011   AST 23 12/29/2011   NA 138 03/10/2012   K 4.5 03/10/2012   CL 100 03/10/2012   CREATININE 1.1 03/10/2012   BUN 24* 03/10/2012   CO2 29 03/10/2012   TSH 0.498 12/28/2011   INR 2.2 03/26/2012     Assessment / Plan:

## 2012-04-08 NOTE — Telephone Encounter (Signed)
F/U  Patient wife returning [place] called.

## 2012-04-16 ENCOUNTER — Ambulatory Visit (INDEPENDENT_AMBULATORY_CARE_PROVIDER_SITE_OTHER): Payer: Medicare Other | Admitting: *Deleted

## 2012-04-16 DIAGNOSIS — I4891 Unspecified atrial fibrillation: Secondary | ICD-10-CM

## 2012-04-16 DIAGNOSIS — I513 Intracardiac thrombosis, not elsewhere classified: Secondary | ICD-10-CM

## 2012-04-16 DIAGNOSIS — Z7901 Long term (current) use of anticoagulants: Secondary | ICD-10-CM

## 2012-04-16 DIAGNOSIS — I5189 Other ill-defined heart diseases: Secondary | ICD-10-CM

## 2012-05-25 ENCOUNTER — Ambulatory Visit (INDEPENDENT_AMBULATORY_CARE_PROVIDER_SITE_OTHER): Payer: Medicare Other | Admitting: *Deleted

## 2012-05-25 DIAGNOSIS — I513 Intracardiac thrombosis, not elsewhere classified: Secondary | ICD-10-CM

## 2012-05-25 DIAGNOSIS — I5189 Other ill-defined heart diseases: Secondary | ICD-10-CM

## 2012-05-25 DIAGNOSIS — I4891 Unspecified atrial fibrillation: Secondary | ICD-10-CM

## 2012-05-25 DIAGNOSIS — Z7901 Long term (current) use of anticoagulants: Secondary | ICD-10-CM

## 2012-05-26 ENCOUNTER — Encounter (INDEPENDENT_AMBULATORY_CARE_PROVIDER_SITE_OTHER): Payer: Self-pay | Admitting: General Surgery

## 2012-05-26 ENCOUNTER — Ambulatory Visit (INDEPENDENT_AMBULATORY_CARE_PROVIDER_SITE_OTHER): Payer: Medicare Other | Admitting: General Surgery

## 2012-05-26 VITALS — BP 128/62 | HR 51 | Temp 97.8°F | Resp 18 | Ht 70.0 in | Wt 196.8 lb

## 2012-05-26 DIAGNOSIS — E042 Nontoxic multinodular goiter: Secondary | ICD-10-CM

## 2012-05-26 NOTE — Progress Notes (Signed)
Subjective:     Patient ID: Jimmy Myers, male   DOB: [birth year], 78 y.o.   MRN: [id number]  HPI  He is here for a follow up visit of his bilateral thyroid nodules.  He has no dysphagia and no difficulty with breathing. He states his heart is stronger. He is still taking the Coumadin.   Review of Systems  He denies dyspnea with exertion or chest pain.     Objective:   Physical Exam Gen.-she looks well and is in no acute distress.  HEENT: No exophthalmos  Neck: Bilateral palpable nodules in the thyroid gland. No cervical adenopathy.    Assessment:     Multiple incidentally found thyroid nodules that are asymptomatic. He seems to be better from a cardiac standpoint.    Plan:     I will talk with his cardiologist about holding the Coumadin and getting fine-needle aspiration biopsy of the dominant nodules. We'll have him follow up in 6 months or sooner depending on the results.

## 2012-05-26 NOTE — Patient Instructions (Signed)
We will call you and let you know if we are going to do the biopsy of the thyroid gland nodules.

## 2012-06-22 ENCOUNTER — Ambulatory Visit (INDEPENDENT_AMBULATORY_CARE_PROVIDER_SITE_OTHER): Payer: Medicare Other | Admitting: *Deleted

## 2012-06-22 DIAGNOSIS — I5189 Other ill-defined heart diseases: Secondary | ICD-10-CM

## 2012-06-22 DIAGNOSIS — I4891 Unspecified atrial fibrillation: Secondary | ICD-10-CM

## 2012-06-22 DIAGNOSIS — I513 Intracardiac thrombosis, not elsewhere classified: Secondary | ICD-10-CM

## 2012-06-22 DIAGNOSIS — Z7901 Long term (current) use of anticoagulants: Secondary | ICD-10-CM

## 2012-07-20 ENCOUNTER — Ambulatory Visit (INDEPENDENT_AMBULATORY_CARE_PROVIDER_SITE_OTHER): Payer: Medicare Other | Admitting: *Deleted

## 2012-07-20 DIAGNOSIS — Z7901 Long term (current) use of anticoagulants: Secondary | ICD-10-CM

## 2012-07-20 DIAGNOSIS — I5189 Other ill-defined heart diseases: Secondary | ICD-10-CM

## 2012-07-20 DIAGNOSIS — I4891 Unspecified atrial fibrillation: Secondary | ICD-10-CM

## 2012-07-20 DIAGNOSIS — I513 Intracardiac thrombosis, not elsewhere classified: Secondary | ICD-10-CM

## 2012-07-22 ENCOUNTER — Ambulatory Visit (INDEPENDENT_AMBULATORY_CARE_PROVIDER_SITE_OTHER): Payer: Medicare Other | Admitting: Internal Medicine

## 2012-07-22 ENCOUNTER — Encounter: Payer: Self-pay | Admitting: Internal Medicine

## 2012-07-22 ENCOUNTER — Other Ambulatory Visit: Payer: Self-pay | Admitting: *Deleted

## 2012-07-22 ENCOUNTER — Other Ambulatory Visit (INDEPENDENT_AMBULATORY_CARE_PROVIDER_SITE_OTHER): Payer: Medicare Other

## 2012-07-22 VITALS — BP 110/52 | HR 51 | Ht 61.2 in | Wt 199.0 lb

## 2012-07-22 DIAGNOSIS — R079 Chest pain, unspecified: Secondary | ICD-10-CM

## 2012-07-22 DIAGNOSIS — E042 Nontoxic multinodular goiter: Secondary | ICD-10-CM

## 2012-07-22 DIAGNOSIS — I4891 Unspecified atrial fibrillation: Secondary | ICD-10-CM

## 2012-07-22 DIAGNOSIS — I5023 Acute on chronic systolic (congestive) heart failure: Secondary | ICD-10-CM

## 2012-07-22 DIAGNOSIS — I10 Essential (primary) hypertension: Secondary | ICD-10-CM

## 2012-07-22 DIAGNOSIS — I5022 Chronic systolic (congestive) heart failure: Secondary | ICD-10-CM

## 2012-07-22 DIAGNOSIS — I7789 Other specified disorders of arteries and arterioles: Secondary | ICD-10-CM

## 2012-07-22 LAB — BASIC METABOLIC PANEL
BUN: 31 mg/dL — ABNORMAL HIGH (ref 6–23)
CO2: 30 mEq/L (ref 19–32)
Calcium: 9 mg/dL (ref 8.4–10.5)
Chloride: 102 mEq/L (ref 96–112)
Creatinine, Ser: 1.1 mg/dL (ref 0.4–1.5)
GFR: 68.76 mL/min (ref >60.00)
Glucose, Bld: 107 mg/dL — ABNORMAL HIGH (ref 70–99)
Potassium: 4.1 mEq/L (ref 3.5–5.1)
Sodium: 138 mEq/L (ref 135–145)

## 2012-07-22 NOTE — Assessment & Plan Note (Signed)
CT planned in December for follow-up He will also establish with Dr Tyrone Sage in December

## 2012-07-22 NOTE — Assessment & Plan Note (Signed)
Stable No change required today  

## 2012-07-22 NOTE — Assessment & Plan Note (Signed)
Maintaining sinus rhythm Continue coumadin 

## 2012-07-22 NOTE — Assessment & Plan Note (Signed)
Followed by Dr Abbey Chatters.   Plans for a biopsy are noted.  It would be very reasonable to hold coumadin for 5 days prior to this procedure and then restart coumadin immediately afterwards.

## 2012-07-22 NOTE — Patient Instructions (Addendum)
Your physician recommends that you schedule a follow-up appointment in: 3 months with Lori Gerhardt,NP  

## 2012-07-22 NOTE — Assessment & Plan Note (Signed)
Much improved with sinus rhythm Consider decreasing lasix to 40mg  daily upon return

## 2012-07-22 NOTE — Progress Notes (Signed)
PCP:  Josue Hector, MD  The patient presents today for routine electrophysiology followup.  Since last being seen in our clinic, the patient reports doing very well.  He is maintaining sinus rhythm.  He is without symptoms of heart failure.   Today, he denies symptoms of palpitations, chest pain, shortness of breath, orthopnea, PND, lower extremity edema, dizziness, presyncope, syncope, or neurologic sequela.  The patient feels that he is tolerating medications without difficulties and is otherwise without complaint today.   Past Medical History  Diagnosis Date  . Systolic congestive heart failure with reduced left ventricular function, NYHA class 1     EF is 20 to 25% per echo; felt to be due to reversible tachycardia induced CM  . A-fib May 2013    s/p TEE/cardioversion June 2013  . Anxiety   . Pneumonia May 2013  . HTN (hypertension)   . Thrombocytopenia   . Chronic anticoagulation     on coumadin - checked in [place]  . Aortic root enlargement     at least 5cm by CT  . Bronchitis   . Atrial thrombus   . Bradycardia    Past Surgical History  Procedure Date  . Knee arthroscopy rt knee  . Prostate surgery   . Tee without cardioversion 12/30/2011    Procedure: TRANSESOPHAGEAL ECHOCARDIOGRAM (TEE);  Surgeon: Lewayne Bunting, MD;  Location: [facility] ENDOSCOPY;  Service: Cardiovascular;  Laterality: N/A;  . Cardioversion 12/30/2011    Procedure: CARDIOVERSION;  Surgeon: Lewayne Bunting, MD;  Location: [facility] ENDOSCOPY;  Service: Cardiovascular;  Laterality: N/A;  . Esophagogastroduodenoscopy 12/30/2011    Procedure: ESOPHAGOGASTRODUODENOSCOPY (EGD);  Surgeon: Lewayne Bunting, MD;  Location: [facility] ENDOSCOPY;  Service: Cardiovascular;  Laterality: N/A;  . Esophagogastroduodenoscopy 12/30/2011    Procedure: ESOPHAGOGASTRODUODENOSCOPY (EGD);  Surgeon: Hart Carwin, MD;  Location: [facility] ENDOSCOPY;  Service: Endoscopy;  Laterality: N/A;  . Tee without cardioversion 02/16/2012    Procedure:  TRANSESOPHAGEAL ECHOCARDIOGRAM (TEE);  Surgeon: Lewayne Bunting, MD;  Location: [facility] ENDOSCOPY;  Service: Cardiovascular;  Laterality: N/A;  . Cardioversion 02/16/2012    Procedure: CARDIOVERSION;  Surgeon: Lewayne Bunting, MD;  Location: [facility] ENDOSCOPY;  Service: Cardiovascular;  Laterality: N/A;    Current Outpatient Prescriptions  Medication Sig Dispense Refill  . clorazepate (TRANXENE) 3.75 MG tablet Take 3.75 mg by mouth daily as needed. For anxiety      . furosemide (LASIX) 80 MG tablet Take 1 tablet (80 mg total) by mouth daily.  90 tablet  3  . lisinopril (PRINIVIL,ZESTRIL) 5 MG tablet Take 1 tablet (5 mg total) by mouth daily.  90 tablet  3  . metoprolol tartrate (LOPRESSOR) 25 MG tablet Take 1 tablet (25 mg total) by mouth 2 (two) times daily.  60 tablet  11  . nitroGLYCERIN (NITROSTAT) 0.4 MG SL tablet Place 1 tablet (0.4 mg total) under the tongue every 5 (five) minutes x 3 doses as needed for chest pain.  30 tablet  0  . omeprazole (PRILOSEC) 20 MG capsule Take 1 capsule (20 mg total) by mouth daily.  30 capsule  0  . polyethylene glycol (MIRALAX / GLYCOLAX) packet Take 17 g by mouth daily.      . potassium chloride SA (K-DUR,KLOR-CON) 20 MEQ tablet Take 2 tablets (40 mEq total) by mouth daily.  60 tablet  0  . warfarin (COUMADIN) 5 MG tablet Take 5 mg by mouth as directed.      . [DISCONTINUED] warfarin (COUMADIN) 5 MG tablet Take  1 tablet (5 mg total) by mouth daily. Take 1 tablet daily except 1 1/2 tablets on Fridays  90 tablet  3    Allergies  Allergen Reactions  . Codeine Other (See Comments)    nervousness  . Simvastatin Other (See Comments)    unknown  . Vicodin (Hydrocodone-Acetaminophen) Other (See Comments)    nervousness    History   Social History  . Marital Status: Married    Spouse Name: N/A    Number of Children: N/A  . Years of Education: N/A   Occupational History  . Not on file.   Social History Main Topics  . Smoking status: Never Smoker   .  Smokeless tobacco: Not on file  . Alcohol Use: No  . Drug Use: No  . Sexually Active: Not Currently   Other Topics Concern  . Not on file   Social History Narrative  . No narrative on file    Family History  Problem Relation Age of Onset  . Cancer Brother     stomach   Physical Exam: Filed Vitals:   07/22/12 1335  BP: 110/52  Pulse: 51  Height: 5' 1.2" (1.554 m)  Weight: 199 lb (90.266 kg)    GEN- The patient is well appearing, alert and oriented x 3 today.   Head- normocephalic, atraumatic Eyes-  Sclera clear, conjunctiva pink Ears- hearing intact Oropharynx- clear Neck- supple, no JVP Lymph- no cervical lymphadenopathy Lungs- Clear to ausculation bilaterally, normal work of breathing Heart- Regular rate and rhythm, no murmurs, rubs or gallops, PMI not laterally displaced GI- soft, NT, ND, + BS Extremities- no clubbing, cyanosis, or edema   ekg today reveals sinus rhythm 51 bpm, otherwise normal ekg  Assessment and Plan:

## 2012-08-03 ENCOUNTER — Telehealth (INDEPENDENT_AMBULATORY_CARE_PROVIDER_SITE_OTHER): Payer: Self-pay | Admitting: General Surgery

## 2012-08-03 ENCOUNTER — Encounter (INDEPENDENT_AMBULATORY_CARE_PROVIDER_SITE_OTHER): Payer: Self-pay | Admitting: General Surgery

## 2012-08-03 NOTE — Telephone Encounter (Signed)
Patient states he is returning a call from our office/ Dr Abbey Chatters. Please call patient back [phone].

## 2012-08-03 NOTE — Progress Notes (Signed)
Patient ID: Jimmy Myers, male   DOB: 12/03/1931, 78 y.o.   MRN: [id number] His heart seems to be improving. I spoke with him today about getting image guided biopsies the right-sided and left-sided nodules. He is agreeable to this. We will send a consult to interventional radiology and try to arrange this. We will inform interventional radiology that he is on Coumadin.

## 2012-08-04 ENCOUNTER — Other Ambulatory Visit (INDEPENDENT_AMBULATORY_CARE_PROVIDER_SITE_OTHER): Payer: Self-pay | Admitting: General Surgery

## 2012-08-04 ENCOUNTER — Telehealth (INDEPENDENT_AMBULATORY_CARE_PROVIDER_SITE_OTHER): Payer: Self-pay

## 2012-08-04 DIAGNOSIS — E042 Nontoxic multinodular goiter: Secondary | ICD-10-CM

## 2012-08-04 NOTE — Telephone Encounter (Signed)
LM on [facility] IR scheduling line to schedule US guided bx of thyroid for the patient.  Orders have gone to [facility].

## 2012-08-05 ENCOUNTER — Other Ambulatory Visit (INDEPENDENT_AMBULATORY_CARE_PROVIDER_SITE_OTHER): Payer: Self-pay | Admitting: General Surgery

## 2012-08-05 DIAGNOSIS — E042 Nontoxic multinodular goiter: Secondary | ICD-10-CM

## 2012-08-09 ENCOUNTER — Telehealth: Payer: Self-pay | Admitting: Internal Medicine

## 2012-08-09 NOTE — Telephone Encounter (Signed)
OK to stop coumadin for 4 days.  Restart immediatly after the procedure.

## 2012-08-09 NOTE — Telephone Encounter (Signed)
Jimmy Myers with [facility] needs ok for pt to stop coumadin 4 days prior to thyroid bx

## 2012-08-10 ENCOUNTER — Ambulatory Visit (INDEPENDENT_AMBULATORY_CARE_PROVIDER_SITE_OTHER): Payer: Medicare Other | Admitting: *Deleted

## 2012-08-10 DIAGNOSIS — I513 Intracardiac thrombosis, not elsewhere classified: Secondary | ICD-10-CM

## 2012-08-10 DIAGNOSIS — I5189 Other ill-defined heart diseases: Secondary | ICD-10-CM

## 2012-08-10 DIAGNOSIS — I4891 Unspecified atrial fibrillation: Secondary | ICD-10-CM

## 2012-08-10 DIAGNOSIS — Z7901 Long term (current) use of anticoagulants: Secondary | ICD-10-CM

## 2012-08-10 LAB — POCT INR: INR: 2.3

## 2012-08-16 ENCOUNTER — Ambulatory Visit (INDEPENDENT_AMBULATORY_CARE_PROVIDER_SITE_OTHER)
Admission: RE | Admit: 2012-08-16 | Discharge: 2012-08-16 | Disposition: A | Discharge status: Home / Self Care | Payer: Medicare Other | Source: Ambulatory Visit | Attending: Nurse Practitioner | Admitting: Nurse Practitioner

## 2012-08-16 DIAGNOSIS — I498 Other specified cardiac arrhythmias: Secondary | ICD-10-CM

## 2012-08-16 DIAGNOSIS — I4891 Unspecified atrial fibrillation: Secondary | ICD-10-CM

## 2012-08-16 DIAGNOSIS — I5023 Acute on chronic systolic (congestive) heart failure: Secondary | ICD-10-CM

## 2012-08-16 DIAGNOSIS — I10 Essential (primary) hypertension: Secondary | ICD-10-CM

## 2012-08-16 DIAGNOSIS — R001 Bradycardia, unspecified: Secondary | ICD-10-CM

## 2012-08-16 DIAGNOSIS — I7789 Other specified disorders of arteries and arterioles: Secondary | ICD-10-CM

## 2012-08-16 DIAGNOSIS — E042 Nontoxic multinodular goiter: Secondary | ICD-10-CM

## 2012-08-16 MED ORDER — IOHEXOL 350 MG/ML SOLN
100.0000 mL | Freq: Once | INTRAVENOUS | Status: AC | PRN
  Administered 2012-08-16: 100 mL via INTRAVENOUS

## 2012-08-18 ENCOUNTER — Other Ambulatory Visit (HOSPITAL_COMMUNITY)
Admission: RE | Admit: 2012-08-18 | Discharge: 2012-08-18 | Disposition: A | Discharge status: Home / Self Care | Payer: Medicare Other | Source: Ambulatory Visit | Attending: Interventional Radiology | Admitting: Interventional Radiology

## 2012-08-18 ENCOUNTER — Ambulatory Visit
Admission: RE | Admit: 2012-08-18 | Discharge: 2012-08-18 | Disposition: A | Discharge status: Home / Self Care | Payer: Medicare Other | Source: Ambulatory Visit | Attending: General Surgery | Admitting: General Surgery

## 2012-08-18 ENCOUNTER — Ambulatory Visit (INDEPENDENT_AMBULATORY_CARE_PROVIDER_SITE_OTHER): Payer: Medicare Other | Admitting: *Deleted

## 2012-08-18 DIAGNOSIS — E042 Nontoxic multinodular goiter: Secondary | ICD-10-CM

## 2012-08-18 DIAGNOSIS — I5189 Other ill-defined heart diseases: Secondary | ICD-10-CM

## 2012-08-18 DIAGNOSIS — E049 Nontoxic goiter, unspecified: Secondary | ICD-10-CM | POA: Insufficient documentation

## 2012-08-18 DIAGNOSIS — I513 Intracardiac thrombosis, not elsewhere classified: Secondary | ICD-10-CM

## 2012-08-18 DIAGNOSIS — Z7901 Long term (current) use of anticoagulants: Secondary | ICD-10-CM

## 2012-08-18 DIAGNOSIS — I4891 Unspecified atrial fibrillation: Secondary | ICD-10-CM

## 2012-08-18 LAB — POCT INR: INR: 1.2

## 2012-08-19 ENCOUNTER — Encounter: Payer: Self-pay | Admitting: Cardiothoracic Surgery

## 2012-08-19 ENCOUNTER — Institutional Professional Consult (permissible substitution) (INDEPENDENT_AMBULATORY_CARE_PROVIDER_SITE_OTHER): Payer: Medicare Other | Admitting: Cardiothoracic Surgery

## 2012-08-19 VITALS — BP 104/61 | HR 52 | Resp 18 | Ht 70.0 in | Wt 199.0 lb

## 2012-08-19 DIAGNOSIS — I712 Thoracic aortic aneurysm, without rupture: Secondary | ICD-10-CM

## 2012-08-19 NOTE — Patient Instructions (Signed)
Thoracic Aortic Aneurysm An aneurysm is the enlargement (dilatation), bulging or ballooning out of part of the wall of a vein or artery. An Aortic Aneurysm is a bulging in the largest artery of the body. This artery supplies blood from the heart to the rest of the body. The first part of the aorta is called the thoracic aorta. It leaves the heart, ascends (rises), arches, and descends (goes down) through the chest until it reaches the diaphragm (the muscular partition between the chest and abdomen (belly). The second part of the aorta is then called the abdominal aorta after it has passed the diaphragm and continues down through the abdomen. The abdominal aorta ends where it splits to form the two iliac arteries that go to the legs. Aortic aneurysms can develop anywhere along the length of the aorta. A thoracic aortic aneurysm (TAA) occurs in the first part of the aorta, between the heart and the diaphragm. The major importance of an aneurysm is that it can rupture or tear (dissect), causing death unless diagnosed and treated promptly. CAUSES  Most thoracic aortic aneurysms are related to arteriosclerosis. The arteriosclerosis can weaken the aortic wall. The pressure of the blood being pumped through the aorta causes it to balloon out at the site of weakness. Therefore, elevated blood pressure (hypertension) is associated with aneurysm. Other risk factors include:  Age over 60.  Tobacco use.  Male sex.  Family history of aneurysm. Additional causes of thoracic aortic aneurysms include:  Genetics (passed by birth).  Injury: After physical trauma to the aorta.  Inflammation of blood vessels.  Hardening of the arteries.  Infection. SYMPTOMS  Many aneurysms do not cause problems. A small, unchanging or slowly changing aneurysm may produce no symptoms until it suddenly ruptures or dissects (separation of the layers of the aortic wall) without warning. It may then cause death. The symptoms  (problems) of a developing aneurysm will partly depend on its size and rate of growth. Thoracic aortic aneurysms may cause pain in the:  Chest.  Back.  Sides.  Abdomen. The pain most often has a deep quality as if it is boring into the person. It may cause:  Heart failure.  Heart attack.  Hoarseness.  Cough.  Shortness of breath.  Swallowing problems. DIAGNOSIS  A thoracic aortic aneurysm may be suspected based on your symptoms. It may also be detected by x-ray or CT studies done for unrelated reasons.  Several different imaging studies can be used to confirm a TAA:  An echocardiogram is an ultrasound test to examine the heart. It can also examine the first parts of the aorta. Sometimes, this test is done by putting you to sleep and inserting a flexible telescope through your mouth into your esophagus, which is next to the aorta; excellent pictures of the aorta can be obtained. This is called a transesophageal echocardiogram (TEE).  CT scanning of the chest is accurate at showing the exact size and shape of the aneurysm.  MRI scanning is accurate, and is used for certain types of TAA.  An aortic angiogram shows the source of the major blood vessels arising from the aorta. It reveals the size and extent of any aneurysm. It can also show a clot clinging to the wall of the aneurysm (mural thrombus). The angiogram may give information about a tear of the aorta. TREATMENT  Treatment for a thoracic aortic aneurysm depends on:  Location.  Size.  Other factors.  Rate of growth.  Underlying cause. Medical treatment is used   for smaller or complicated aneurysms, or those that do not cause symptoms. These include:  Stopping smoking.  Blood pressure control.  Control of cholesterol. Surgical treatment is used for aneurysms that cause symptoms, or for those that are large or growing in size. The surgical technique depends on the location of the aneurysm. HOME CARE INSTRUCTIONS     If you smoke, stop. If you do not, do not start.  Take all medications as prescribed.  Your caregiver will tell you when to have your aneurysm rechecked, either by echocardiogram or CT scan. Be sure to keep this and all follow-up appointments. SEEK MEDICAL CARE IF:   You develop mild pain in your chest, upper back, sides, or abdomen.  You develop cough, hoarseness or trouble swallowing. SEEK IMMEDIATE MEDICAL CARE IF:   You develop severe chest or abdominal pain, or severe pain moving (radiating) to your back.  You suddenly develop cold or blue toes or feet.  You suddenly develop lightheadedness or fainting spells.  You develop trouble breathing. Document Released: 08/18/2005 Document Revised: 11/10/2011 Document Reviewed: 07/07/2007 [employer] Patient Information 2013 [employer].  

## 2012-08-20 NOTE — Progress Notes (Signed)
[address]            [place] [address]          [phone]      [name] Mars Medical Record #[id number] Date of Birth: Sep 23, 1931  Referring: Hillis Range, MD Primary Care: Josue Hector, MD  Chief Complaint:    Chief Complaint  Patient presents with  . Thoracic Aortic Aneurysm    Referral from Dr Johney Frame for surgical eval on ascending aortic aneurysm CTA Chest on 08/13/12     History of Present Illness:    Patient is a 78 year old male who presented to cardiology in April of 2013 with increasing congestive heart failure, chronically elevated heart rate with atrial fibrillation in the 120s to 130s and left atrial clot. Since that time he's been started on Coumadin treated for heart failure, had a cardioversion and is maintaining sinus rhythm. His echo evidence of LV function has markedly improved. In his evaluation he was incidentally noted to have a dilated descending aorta. He comes to the office today for surgical evaluation. He has not had a recent cardiac catheterization.      Current Activity/ Functional Status: Patient is independent with mobility/ambulation, transfers, ADL's, IADL's.   Past Medical History  Diagnosis Date  . Systolic congestive heart failure with reduced left ventricular function, NYHA class 1     EF is 20 to 25% per echo; felt to be due to reversible tachycardia induced CM  . A-fib May 2013    s/p TEE/cardioversion June 2013  . Anxiety   . Pneumonia May 2013  . HTN (hypertension)   . Thrombocytopenia   . Chronic anticoagulation     on coumadin - checked in [place]  . Aortic root enlargement     at least 5cm by CT  . Bronchitis   . Atrial thrombus   . Bradycardia     Past Surgical History  Procedure Date  . Knee arthroscopy rt knee  . Prostate surgery   . Tee without cardioversion 12/30/2011    Procedure: TRANSESOPHAGEAL ECHOCARDIOGRAM (TEE);  Surgeon: Lewayne Bunting, MD;  Location:  [facility] ENDOSCOPY;  Service: Cardiovascular;  Laterality: N/A;  . Cardioversion 12/30/2011    Procedure: CARDIOVERSION;  Surgeon: Lewayne Bunting, MD;  Location: [facility] ENDOSCOPY;  Service: Cardiovascular;  Laterality: N/A;  . Esophagogastroduodenoscopy 12/30/2011    Procedure: ESOPHAGOGASTRODUODENOSCOPY (EGD);  Surgeon: Lewayne Bunting, MD;  Location: [facility] ENDOSCOPY;  Service: Cardiovascular;  Laterality: N/A;  . Esophagogastroduodenoscopy 12/30/2011    Procedure: ESOPHAGOGASTRODUODENOSCOPY (EGD);  Surgeon: Hart Carwin, MD;  Location: [facility] ENDOSCOPY;  Service: Endoscopy;  Laterality: N/A;  . Tee without cardioversion 02/16/2012    Procedure: TRANSESOPHAGEAL ECHOCARDIOGRAM (TEE);  Surgeon: Lewayne Bunting, MD;  Location: [facility] ENDOSCOPY;  Service: Cardiovascular;  Laterality: N/A;  . Cardioversion 02/16/2012    Procedure: CARDIOVERSION;  Surgeon: Lewayne Bunting, MD;  Location: [facility] ENDOSCOPY;  Service: Cardiovascular;  Laterality: N/A;    Family History  Problem Relation Age of Onset  . Cancer Brother     stomach  . Heart disease Mother 38  . Heart disease Father   . Heart disease Sister   . Heart disease Brother 35 died mi, and 42 died of mi  . Hypertension Sister       History  Smoking status  . Never Smoker   Smokeless tobacco  . Not on file  History  Alcohol Use No     Allergies  Allergen Reactions  . Codeine Other (See Comments)    nervousness  . Simvastatin Other (See Comments)    unknown  . Vicodin (Hydrocodone-Acetaminophen) Other (See Comments)    nervousness    Current Outpatient Prescriptions  Medication Sig Dispense Refill  . clorazepate (TRANXENE) 3.75 MG tablet Take 3.75 mg by mouth daily as needed. For anxiety      . furosemide (LASIX) 80 MG tablet Take 1 tablet (80 mg total) by mouth daily.  90 tablet  3  . metoprolol tartrate (LOPRESSOR) 25 MG tablet Take 1 tablet (25 mg total) by mouth 2 (two) times daily.  60 tablet  11  . nitroGLYCERIN (NITROSTAT) 0.4 MG SL  tablet Place 1 tablet (0.4 mg total) under the tongue every 5 (five) minutes x 3 doses as needed for chest pain.  30 tablet  0  . omeprazole (PRILOSEC) 20 MG capsule Take 1 capsule (20 mg total) by mouth daily.  30 capsule  0  . polyethylene glycol (MIRALAX / GLYCOLAX) packet Take 17 g by mouth daily.      . potassium chloride SA (K-DUR,KLOR-CON) 20 MEQ tablet Take 2 tablets (40 mEq total) by mouth daily.  60 tablet  0  . lisinopril (PRINIVIL,ZESTRIL) 5 MG tablet Take 1 tablet (5 mg total) by mouth daily.  90 tablet  3  . warfarin (COUMADIN) 5 MG tablet Take 5 mg by mouth as directed.           Review of Systems:     Cardiac Review of Systems: Y or N  Chest Pain [ n   ]  Resting SOB [  n ] Exertional SOB  [better  ]  Orthopnea [ better ]   Pedal Edema [ y  ]    Palpitations Cove.Etienne  ] Syncope  Milo.Brash  ]   Presyncope [ n  ]  General Review of Systems: [Y] = yes [  ]=no Constitional: recent weight change Cove.Etienne  ]; anorexia [  ]; fatigue [  ]; nausea [  ]; night sweats [ n ]; fever [ n ]; or chills [ n ];                                                                                                                                          Dental: poor dentition[y  ]; Last Dentist visit:couple years  Eye : blurred vision [  ]; diplopia [   ]; vision changes [  ];  Amaurosis fugax[  ]; Resp: cough [  ];  wheezing[ n ];  hemoptysis[  ]; shortness of breath[  ]; paroxysmal nocturnal dyspnea[  ]; dyspnea on exertion[  ]; or orthopnea[  ];  GI:  gallstones[  ], vomiting[  ];  dysphagia[  ]; melena[  ];  hematochezia [  ]; heartburn[  ];  Hx of  Colonoscopy[  ]; GU: kidney stones [  ]; hematuria[  ];   dysuria [  ];  nocturia[  ];  history of     obstruction [  ];             Skin: rash, swelling[  ];, hair loss[  ];  peripheral edema[  ];  or itching[  ]; Musculosketetal: myalgias[  ];  joint swelling[  ];  joint erythema[  ];  joint pain[  ];  back pain[  ];  Heme/Lymph: bruising[  ];  bleeding[  ];  anemia[   ];  Neuro: TIA[  ];  headaches[  ];  stroke[  ];  vertigo[  ];  seizures[  ];   paresthesias[  ];  difficulty walking[  ];  Psych:depression[  ]; anxiety[  ];  Endocrine: diabetes[  ];  thyroid dysfunction[  ];  Immunizations: Flu Milo.Brash  ]; Pneumococcal[  n];  Other:  Physical Exam: BP 104/61  Pulse 52  Resp 18  Ht 5\' 10"  (1.778 m)  Wt 199 lb (90.266 kg)  BMI 28.55 kg/m2  SpO2 96%  General appearance: alert, cooperative and no distress Neurologic: intact Heart: regular rate and rhythm, S1, S2 normal, no murmur, click, rub or gallop and normal apical impulse Lungs: clear to auscultation bilaterally and normal percussion bilaterally Abdomen: soft, non-tender; bowel sounds normal; no masses,  no organomegaly Extremities: extremities normal, atraumatic, no cyanosis or edema and Homans sign is negative, no sign of DVT Patient has no carotid bruits, has no cervical supraclavicular adenopathy, has full DP PT radial brachial and femoral pulses   Diagnostic Studies & Laboratory data:     Recent Radiology Findings:   US Soft Tissue Head/neck  08/18/2012  *RADIOLOGY REPORT*  Clinical Data: Assess thyroid nodules  THYROID ULTRASOUND  Technique: Ultrasound examination of the thyroid gland and adjacent soft tissues was performed.  Comparison:  Prior thyroid ultrasound 01/21/2012  Findings:  Right thyroid lobe:  6.8 x 2.3 x 1.9 cm Left thyroid lobe:  7.1 x 4.0 x 3.4 cm Isthmus:  0.3 cm  Focal nodules:  Right gland:  There are multiple small hypoechoic structures with ring down artifact consistent with small colloid cysts/nodules.  In the lower pole of the right thyroid gland there is a well-defined predominantly solid isoechoic nodule with a thin peripheral hypoechoic halo which measures 2.3 x 2.0 x 1.9 cm.  This is essentially unchanged compared to the prior ultrasound from May. Just inferior to this nodule there is a smaller solid slightly hypoechoic nodule which measures 1.3 x 1.0 x 1.2 cm.  Left  gland:  The left gland is diffusely heterogeneous and enlarged.  There is no definite discrete nodule identified.  At the very inferior aspect of the lower pole, there is a mildly ill- defined hypoechoic region which measures approximately 2.1 x 1.5 x 2.3 cm.  It is difficult to tell if this is within the thyroid gland, or adjacent to it.  Comparison with the prior study demonstrates a suggestion of similar findings although the finding was not discretely measured at that time.  Lymphadenopathy:  None visualized.  IMPRESSION:  1.  Dominant right thyroid nodule is predominately solid and measures 2.3 x 2.0 x 1.9 cm.  While not significantly changed compared to the prior thyroid ultrasound, this nodule warrants further evaluation by fine needle aspiration based on consensus guidelines.  Management of Thyroid Nodules Detected at [place]:  Society of Radiologists in Ultrasound Consensus Conference Statement.  Radiology 2005; 161:096-045.  2.  The left thyroid gland is diffusely heterogeneous and enlarged, no definite discrete nodule is identified for biopsy.  3.  A potential hypoechoic nodule at the inferior most aspect of the left thyroid gland may be within the thyroid gland itself, or adjacent to it.  This appears unchanged compared to the prior study although it was not measured at that time.  Given the uncertainty of whether or not this represents a true lesion, recommend continued attention on follow-up thyroid ultrasound in 6-12 months.   Original Report Authenticated By: Malachy Moan, M.D.    US Thyroid Biopsy  08/18/2012  *RADIOLOGY REPORT*  Clinical Data:  Discrete solid right thyroid nodule  ULTRASOUND GUIDED NEEDLE ASPIRATE BIOPSY OF THE THYROID GLAND  Comparison: Thyroid ultrasound performed the same day 08/18/2012; prior thyroid ultrasound 01/21/2012  Thyroid biopsy was thoroughly discussed with the patient and questions were answered.  The benefits, risks, alternatives, and complications were also  discussed.  The patient understands and wishes to proceed with the procedure.  Written consent was obtained.  Ultrasound was performed to localize and mark an adequate site for the biopsy.  The patient was then prepped and draped in a normal sterile fashion.  Local anesthesia was provided with 1% lidocaine. Using direct ultrasound guidance, four passes were made using 25 gauge needles into the nodule within the right lobe of the thyroid. Ultrasound was used to confirm needle placements on all occasions. Specimens were sent to Pathology for analysis.  Complications:  None  Findings:  Technically successful ultrasound guided biopsy of right thyroid nodule  IMPRESSION:  Ultrasound guided needle aspirate biopsy performed of the right thyroid nodule.  Signed,  Sterling Big, MD Vascular & Interventional Radiologist [facility]   Original Report Authenticated By: Malachy Moan, M.D.      Ct Angio Chest Aortic Dissect W &/or W/o  08/16/2012  *RADIOLOGY REPORT*  Clinical Data: Enlarged aorta  CT ANGIOGRAPHY CHEST  Technique:  Multidetector CT imaging of the chest using the standard protocol during bolus administration of intravenous contrast. Multiplanar reconstructed images including MIPs were obtained and reviewed to evaluate the vascular anatomy.  Contrast: OMNIPAQUE IOHEXOL 350 MG/ML SOLN  Comparison: 01/13/2012  Findings: Dilatation of the aortic root is again noted.  Diameters of the aorta at the sinus of Valsalva, sinotubular junction, and in the ascending aorta are 5.2, 4.5, and 4.6 cm respectively. Previously, the aortic root was 5.1 cm in diameter. Diameters at the aortic arch and descending aorta are 3.1 and 3.6 cm.  The innominate artery is tortuous and ectatic with maximal diameter of 1.6 cm.  Right subclavian and common carotid arteries are patent.  Mild calcified atherosclerotic change at the origin of the right vertebral artery.  Left common carotid and subclavian arteries are  patent.  Atherosclerotic changes at the origin of the left vertebral artery are present making degree of narrowing at its origin difficult.  Coronary artery opacification is difficult due to motion artifact. Atherosclerotic changes at the right coronary artery branches, left main coronary artery, left anterior descending coronary artery are present.  Cardiomegaly is present.  The heart is dilated.  The left ventricle, in particular is significantly dilated without significant calcification of the aortic valve suggesting aortic valve insufficiency. Left atrium is not particularly dilated.  No obvious filling defects in the pulmonary arterial tree to suggest acute pulmonary thromboembolism.  The left lobe of the thyroid mass has further enlarged measuring 5.3 x 3.3  centimeters on  image 9.  Negative abnormal mediastinal adenopathy.  Negative pericardial effusion.  No evidence of pulmonary parenchymal mass or consolidation.  No pneumothorax.  No pleural effusion  Calcified central disc herniation neck T9-10 causes an element of spinal stenosis.  Diffuse hepatic steatosis.  Tiny hypodensity in the left lobe of the liver is stable.  Stable benign appearing right renal cyst.  Small gallstone.  Atherosclerotic changes at the origin of the SMA without significant narrowing.  Severe narrowing in the proximal celiac.  IMPRESSION: Stable dilatation of the aortic root.  Diameter at the sinus of Valsalva is 5.2 cm.  There is dilatation of the left ventricle without significant valvular calcifications suggesting aortic valve regurgitation.  Left thyroid mass has further enlarged.  Correlation with sonography is recommended.  Cholelithiasis.  Stable incidental findings.   Original Report Authenticated By: Jolaine Click, M.D.    Recent Lab Findings: Lab Results  Component Value Date   WBC 4.3* 03/10/2012   HGB 11.0* 03/10/2012   HCT 32.7* 03/10/2012   PLT 98.0* 03/10/2012   GLUCOSE 107* 07/22/2012   CHOL 85 12/29/2011   TRIG 30  12/29/2011   HDL 40 12/29/2011   LDLCALC 39 12/29/2011   ALT 34 12/29/2011   AST 23 12/29/2011   NA 138 07/22/2012   K 4.1 07/22/2012   CL 102 07/22/2012   CREATININE 1.1 07/22/2012   BUN 31* 07/22/2012   CO2 30 07/22/2012   TSH 0.498 12/28/2011   INR 1.2 08/18/2012      Assessment / Plan:   Aortic root enlargement  With out AI, at the sinus  5.1 cm mid ascending aorta 4.5 cm Atrial fibrillation  Maintaining sinus rhythm  Continue coumadin Chronic systolic heart failure hypertension  Multiple thyroid nodules   Followed by Dr Abbey Chatters.   Biopsy done yesterday is noted noted.   I reviewed with the patient the CT scan demonstrating the size of his aorta, without aortic insufficiency in the mid ascending aorta approximately 4.5 cm I would recommend continued observation rather than elective replacement of his descending aorta and aortic root at this time. I'll plan to see him back in approximately 6 months with a followup CT scan of the chest.       Delight Ovens MD  Beeper [phone] Office [phone] 08/20/2012 11:43 AM

## 2012-08-26 ENCOUNTER — Encounter (INDEPENDENT_AMBULATORY_CARE_PROVIDER_SITE_OTHER): Payer: Self-pay

## 2012-08-27 ENCOUNTER — Telehealth (INDEPENDENT_AMBULATORY_CARE_PROVIDER_SITE_OTHER): Payer: Self-pay | Admitting: General Surgery

## 2012-08-27 NOTE — Telephone Encounter (Signed)
Wife called for path results; informed "non-neoplastic goiter."  Wife asks if they need to come in for appt to discuss treatment, as it is quite large now.  Please advise.

## 2012-08-31 ENCOUNTER — Ambulatory Visit (INDEPENDENT_AMBULATORY_CARE_PROVIDER_SITE_OTHER): Payer: Medicare Other | Admitting: *Deleted

## 2012-08-31 DIAGNOSIS — I5189 Other ill-defined heart diseases: Secondary | ICD-10-CM

## 2012-08-31 DIAGNOSIS — Z7901 Long term (current) use of anticoagulants: Secondary | ICD-10-CM

## 2012-08-31 DIAGNOSIS — I4891 Unspecified atrial fibrillation: Secondary | ICD-10-CM

## 2012-08-31 DIAGNOSIS — I513 Intracardiac thrombosis, not elsewhere classified: Secondary | ICD-10-CM

## 2012-09-08 ENCOUNTER — Telehealth: Payer: Self-pay | Admitting: Internal Medicine

## 2012-09-08 NOTE — Telephone Encounter (Signed)
Pt is on warfrin and needs dental work done and they need a letter faxed to there office stating if he can stay on or needs to come off and how long and when can he start back

## 2012-09-08 NOTE — Telephone Encounter (Signed)
Spoke to [place] at [employer] office.She stated patient needs dental work done.Patient is on coumadin will need a letter from [facility] advising if patient needs to stop or continue to take.Message sent to Dr.Allred's nurse.

## 2012-09-14 ENCOUNTER — Other Ambulatory Visit (INDEPENDENT_AMBULATORY_CARE_PROVIDER_SITE_OTHER): Payer: Self-pay | Admitting: General Surgery

## 2012-09-14 ENCOUNTER — Ambulatory Visit (INDEPENDENT_AMBULATORY_CARE_PROVIDER_SITE_OTHER): Payer: Medicare Other | Admitting: General Surgery

## 2012-09-14 ENCOUNTER — Telehealth (INDEPENDENT_AMBULATORY_CARE_PROVIDER_SITE_OTHER): Payer: Self-pay

## 2012-09-14 ENCOUNTER — Encounter (INDEPENDENT_AMBULATORY_CARE_PROVIDER_SITE_OTHER): Payer: Self-pay | Admitting: General Surgery

## 2012-09-14 VITALS — BP 136/62 | HR 56 | Temp 97.2°F | Resp 16 | Ht 70.5 in | Wt 200.2 lb

## 2012-09-14 DIAGNOSIS — E049 Nontoxic goiter, unspecified: Secondary | ICD-10-CM

## 2012-09-14 MED ORDER — DOXYCYCLINE HYCLATE 100 MG PO TABS: 100.0000 mg | ORAL_TABLET | Freq: Two times a day (BID) | ORAL | Status: DC

## 2012-09-14 NOTE — Patient Instructions (Addendum)
You have a benign thyroid goiter.  I do not feel you need surgery for this at this time.  As for the chest wall infection, take the antibiotic as directed. Apply warm moist heat to it. He will need to have the INR checked In 4 days as the antibiotic combined with the Coumadin can make your blood too thin.

## 2012-09-14 NOTE — Progress Notes (Signed)
Subjective:     Patient ID: Jimmy Myers, male   DOB: Nov 21, 1931, 79 y.o.   MRN: [id number]  HPI  He is here for a follow up visit of his bilateral thyroid nodules.  He had an image guided needle biopsy and the pathology is consistent with a nonneoplastic goiter. No malignancy identified  He has no dysphagia and no difficulty with breathing.  He also states that he has an inflamed area in the right lateral chest wall that has been there for 3 days.   Review of Systems  He denies dyspnea with exertion or chest pain.     Objective:   Physical Exam Gen.-he looks well and is in no acute distress.  HEENT: No exophthalmos  Neck: Bilateral palpable nodules in the thyroid gland that are unchanged in size. No cervical adenopathy.  Chest-2 cm area of erythema and induration    Assessment:     1.  Benign multinodular thyroid goiter that is essentially asymptomatic.  2.  Right chest wall abscess-he is anticoagulated.    Plan:     I do not think he needs any surgery at this time on his thyroid goiter. If he becomes significantly symptomatic from that then we can reconsider  We'll place him on doxycycline and a warm moist compresses to the area for the chest wall abscess. Have informed him that the antibiotic combined with Coumadin can make his blood to thin and should have his INR checked in 3-4 days.Marland Kitchen

## 2012-09-14 NOTE — Telephone Encounter (Signed)
Spoke with Misty Stanley, the nurse at [facility] in [place].  Pt was put on antibiotics today by Dr. Abbey Chatters for an infected cyst on his right flank.  Cardiology will check his INR on 09/16/12.

## 2012-09-17 ENCOUNTER — Telehealth: Payer: Self-pay | Admitting: *Deleted

## 2012-09-17 ENCOUNTER — Ambulatory Visit (INDEPENDENT_AMBULATORY_CARE_PROVIDER_SITE_OTHER): Payer: Medicare Other | Admitting: *Deleted

## 2012-09-17 DIAGNOSIS — I513 Intracardiac thrombosis, not elsewhere classified: Secondary | ICD-10-CM

## 2012-09-17 DIAGNOSIS — I5189 Other ill-defined heart diseases: Secondary | ICD-10-CM

## 2012-09-17 DIAGNOSIS — I4891 Unspecified atrial fibrillation: Secondary | ICD-10-CM

## 2012-09-17 DIAGNOSIS — Z7901 Long term (current) use of anticoagulants: Secondary | ICD-10-CM

## 2012-09-17 NOTE — Telephone Encounter (Signed)
Spoke with Florentina Addison at pharmacy to inform her per coumadin nurse, patient was informed today to go by Dr. Joyce Copa office to determine what abt patient to use. Per coumadin nurse pharmacist is not to fill doxycycline at this point.

## 2012-09-17 NOTE — Telephone Encounter (Signed)
Please advise patient of this per pharmacist.

## 2012-09-17 NOTE — Telephone Encounter (Signed)
Saw pt today for INR check.  He was suppose to have started Doxycycline on 1/14 per Dr Abbey Chatters but pt refused after pharmacist told him it might interfere with coumadin.  Wife was present with pt today and she thinks he needs surgery on cyst.  Told pt/wife that would have to be determined by PMD.  They are going to try to see Dr Lysbeth Galas today for further instructions.  Therefore, pharmacist was instructed to place doxycycline on hold till pt sees PMD.

## 2012-09-21 NOTE — Telephone Encounter (Signed)
[facility] called again following up on note because they have not received anything as of yet.

## 2012-09-21 NOTE — Telephone Encounter (Signed)
Hillis Range, MD 09/21/12 OK to stop coumadin for 4 days. Restart immediatly after the procedure.

## 2012-09-22 ENCOUNTER — Telehealth: Payer: Self-pay | Admitting: *Deleted

## 2012-09-22 NOTE — Telephone Encounter (Signed)
Wants to notify you that he is taking antibiotics . / tgs

## 2012-09-22 NOTE — Telephone Encounter (Signed)
Started on Doxycycline 100mg  bid on 1/20 pm for cyst Rt chest wall..  Last INR 2.2  Continue coumadin as directed and come for INR check on 09/24/12 at 11:20am in [place].  Wife in agreement and verbalized understanding.

## 2012-09-24 ENCOUNTER — Ambulatory Visit (INDEPENDENT_AMBULATORY_CARE_PROVIDER_SITE_OTHER): Payer: Medicare Other | Admitting: *Deleted

## 2012-09-24 DIAGNOSIS — I513 Intracardiac thrombosis, not elsewhere classified: Secondary | ICD-10-CM

## 2012-09-24 DIAGNOSIS — I5189 Other ill-defined heart diseases: Secondary | ICD-10-CM

## 2012-09-24 DIAGNOSIS — I4891 Unspecified atrial fibrillation: Secondary | ICD-10-CM

## 2012-09-24 DIAGNOSIS — Z7901 Long term (current) use of anticoagulants: Secondary | ICD-10-CM

## 2012-09-24 LAB — POCT INR: INR: 2.7

## 2012-10-19 ENCOUNTER — Ambulatory Visit (INDEPENDENT_AMBULATORY_CARE_PROVIDER_SITE_OTHER): Payer: Medicare Other | Admitting: *Deleted

## 2012-10-19 DIAGNOSIS — I4891 Unspecified atrial fibrillation: Secondary | ICD-10-CM

## 2012-10-19 DIAGNOSIS — I5189 Other ill-defined heart diseases: Secondary | ICD-10-CM

## 2012-10-19 DIAGNOSIS — Z7901 Long term (current) use of anticoagulants: Secondary | ICD-10-CM

## 2012-10-19 DIAGNOSIS — I513 Intracardiac thrombosis, not elsewhere classified: Secondary | ICD-10-CM

## 2012-10-19 LAB — POCT INR: INR: 2.2

## 2012-10-22 ENCOUNTER — Ambulatory Visit (INDEPENDENT_AMBULATORY_CARE_PROVIDER_SITE_OTHER): Payer: Medicare Other | Admitting: Nurse Practitioner

## 2012-10-22 ENCOUNTER — Encounter: Payer: Self-pay | Admitting: Nurse Practitioner

## 2012-10-22 VITALS — BP 106/50 | HR 50 | Ht 70.0 in | Wt 206.8 lb

## 2012-10-22 DIAGNOSIS — I5022 Chronic systolic (congestive) heart failure: Secondary | ICD-10-CM

## 2012-10-22 DIAGNOSIS — I480 Paroxysmal atrial fibrillation: Secondary | ICD-10-CM

## 2012-10-22 DIAGNOSIS — I4891 Unspecified atrial fibrillation: Secondary | ICD-10-CM

## 2012-10-22 NOTE — Progress Notes (Signed)
Jimmy Myers Date of Birth: 1932/03/30 Medical Record #[id number]  History of Present Illness: Jimmy Myers is seen back today for a 3 month check. He is seen for Jimmy Myers. He has systolic heart failure with an EF down to 20 to 25% with improvement after medical therapy - up to 50% per echo in July of 2013. His other issues include atrial fib with prior cardioversion, bradycardia, enlarged ascending aorta, thyroid nodules and HTN.   He saw Jimmy Myers back in November and was doing ok. He saw Jimmy Myers for consultation of his aorta in December - follow up CT this summer was recommended. He has had his thyroid biopsy completed per Jimmy Myers. This was benign.  He comes in today. He is here with his wife. He is doing ok. Has not been as active with the cold winter/snow. Has gained some weight. No chest pain. Not short of breath. No palpitations. Feels ok. Tolerating his medicines well. Has had recent labs at the [place] and those are reviewed today. TSH was normal. Kidney function was normal. LFTs normal. A1C was 5.4 and lipids looked ok. His blood pressure and heart rate have been ok at home. He is not dizzy or lightheaded. No syncope. He has cracked a tooth and may be having some upcoming dental work.   Current Outpatient Prescriptions on File Prior to Visit  Medication Sig Dispense Refill  . clorazepate (TRANXENE) 3.75 MG tablet Take 3.75 mg by mouth daily as needed. For anxiety      . furosemide (LASIX) 80 MG tablet Take 1 tablet (80 mg total) by mouth daily.  90 tablet  3  . lisinopril (PRINIVIL,ZESTRIL) 5 MG tablet Take 1 tablet (5 mg total) by mouth daily.  90 tablet  3  . metoprolol tartrate (LOPRESSOR) 25 MG tablet Take 1 tablet (25 mg total) by mouth 2 (two) times daily.  60 tablet  11  . nitroGLYCERIN (NITROSTAT) 0.4 MG SL tablet Place 1 tablet (0.4 mg total) under the tongue every 5 (five) minutes x 3 doses as needed for chest pain.  30 tablet  0  . omeprazole (PRILOSEC) 20 MG capsule  Take 1 capsule (20 mg total) by mouth daily.  30 capsule  0  . polyethylene glycol (MIRALAX / GLYCOLAX) packet Take 17 g by mouth daily.      . potassium chloride SA (K-DUR,KLOR-CON) 20 MEQ tablet Take 2 tablets (40 mEq total) by mouth daily.  60 tablet  0  . warfarin (COUMADIN) 5 MG tablet Take 5 mg by mouth as directed.      . doxycycline (VIBRA-TABS) 100 MG tablet Take 1 tablet (100 mg total) by mouth 2 (two) times daily.  20 tablet  0   No current facility-administered medications on file prior to visit.    Allergies  Allergen Reactions  . Codeine Other (See Comments)    nervousness  . Simvastatin Other (See Comments)    unknown  . Vicodin (Hydrocodone-Acetaminophen) Other (See Comments)    nervousness    Past Medical History  Diagnosis Date  . Systolic congestive heart failure with reduced left ventricular function, NYHA class 1     EF is 20 to 25% per echo; felt to be due to reversible tachycardia induced CM  . A-fib May 2013    s/p TEE/cardioversion June 2013  . Anxiety   . Pneumonia May 2013  . HTN (hypertension)   . Thrombocytopenia   . Chronic anticoagulation     on coumadin -  checked in [place]  . Aortic root enlargement     at least 5cm by CT  . Bronchitis   . Atrial thrombus   . Bradycardia     Past Surgical History  Procedure Laterality Date  . Knee arthroscopy  rt knee  . Prostate surgery    . Tee without cardioversion  12/30/2011    Procedure: TRANSESOPHAGEAL ECHOCARDIOGRAM (TEE);  Surgeon: Jimmy Bunting, MD;  Location: [facility] ENDOSCOPY;  Service: Cardiovascular;  Laterality: N/A;  . Cardioversion  12/30/2011    Procedure: CARDIOVERSION;  Surgeon: Jimmy Bunting, MD;  Location: [facility] ENDOSCOPY;  Service: Cardiovascular;  Laterality: N/A;  . Esophagogastroduodenoscopy  12/30/2011    Procedure: ESOPHAGOGASTRODUODENOSCOPY (EGD);  Surgeon: Jimmy Bunting, MD;  Location: [facility] ENDOSCOPY;  Service: Cardiovascular;  Laterality: N/A;  . Esophagogastroduodenoscopy   12/30/2011    Procedure: ESOPHAGOGASTRODUODENOSCOPY (EGD);  Surgeon: Jimmy Carwin, MD;  Location: [facility] ENDOSCOPY;  Service: Endoscopy;  Laterality: N/A;  . Tee without cardioversion  02/16/2012    Procedure: TRANSESOPHAGEAL ECHOCARDIOGRAM (TEE);  Surgeon: Jimmy Bunting, MD;  Location: [facility] ENDOSCOPY;  Service: Cardiovascular;  Laterality: N/A;  . Cardioversion  02/16/2012    Procedure: CARDIOVERSION;  Surgeon: Jimmy Bunting, MD;  Location: [facility] ENDOSCOPY;  Service: Cardiovascular;  Laterality: N/A;    History  Smoking status  . Never Smoker   Smokeless tobacco  . Not on file    History  Alcohol Use No    Family History  Problem Relation Age of Onset  . Cancer Brother     stomach  . Heart disease Mother   . Heart disease Father   . Heart disease Sister   . Heart disease Brother   . Hypertension Sister     Review of Systems: The review of systems is per the HPI.  All other systems were reviewed and are negative.  Physical Exam: BP 106/50  Pulse 50  Ht 5\' 10"  (1.778 m)  Wt 206 lb 12.8 oz (93.804 kg)  BMI 29.67 kg/m2  SpO2 97% Patient is very pleasant and in no acute distress. Skin is warm and dry. Color is normal.  HEENT is unremarkable. Normocephalic/atraumatic. PERRL. Sclera are nonicteric. Neck is supple. No masses. No JVD. Lungs are clear. Cardiac exam shows a regular rate and rhythm. Abdomen is soft. Extremities are without edema. Gait and ROM are intact. No gross neurologic deficits noted.  LABORATORY DATA: Reviewed.   Lab Results  Component Value Date   WBC 4.3* 03/10/2012   HGB 11.0* 03/10/2012   HCT 32.7* 03/10/2012   PLT 98.0* 03/10/2012   GLUCOSE 107* 07/22/2012   CHOL 85 12/29/2011   TRIG 30 12/29/2011   HDL 40 12/29/2011   LDLCALC 39 12/29/2011   ALT 34 12/29/2011   AST 23 12/29/2011   NA 138 07/22/2012   K 4.1 07/22/2012   CL 102 07/22/2012   CREATININE 1.1 07/22/2012   BUN 31* 07/22/2012   CO2 30 07/22/2012   TSH 0.498 12/28/2011   INR 2.2 10/19/2012    Lab Results  Component Value Date   INR 2.2 10/19/2012   INR 2.7 09/24/2012   INR 2.2 09/17/2012    Assessment / Plan: 1. PAF - holding sinus rhythm. Remains on his coumadin without issue.  2. Enlarged aorta - followed by Jimmy Myers with plans for repeat scan this summer  3. Systolic heart failure - last EF looked better. Will keep him on his current regimen.   4. Bradycardia - asymptomatic  I  have encouraged him to stay active. Work on his weight. I will see him back in 4 months.   Patient is agreeable to this plan and will call if any problems develop in the interim.

## 2012-10-22 NOTE — Patient Instructions (Addendum)
I think you are doing well  Stay active  Stay on your current medicines  I will see you in 4 months  Call the [facility] office at [phone] if you have any questions, problems or concerns.

## 2012-11-23 ENCOUNTER — Ambulatory Visit (INDEPENDENT_AMBULATORY_CARE_PROVIDER_SITE_OTHER): Payer: Medicare Other | Admitting: *Deleted

## 2012-11-23 DIAGNOSIS — Z7901 Long term (current) use of anticoagulants: Secondary | ICD-10-CM

## 2012-11-23 DIAGNOSIS — I513 Intracardiac thrombosis, not elsewhere classified: Secondary | ICD-10-CM

## 2012-11-23 DIAGNOSIS — I4891 Unspecified atrial fibrillation: Secondary | ICD-10-CM

## 2012-11-23 DIAGNOSIS — I5189 Other ill-defined heart diseases: Secondary | ICD-10-CM

## 2012-11-23 LAB — POCT INR: INR: 2.2

## 2013-01-04 ENCOUNTER — Telehealth: Payer: Self-pay | Admitting: *Deleted

## 2013-01-04 ENCOUNTER — Ambulatory Visit (INDEPENDENT_AMBULATORY_CARE_PROVIDER_SITE_OTHER): Payer: Medicare Other | Admitting: *Deleted

## 2013-01-04 DIAGNOSIS — Z7901 Long term (current) use of anticoagulants: Secondary | ICD-10-CM

## 2013-01-04 DIAGNOSIS — I5189 Other ill-defined heart diseases: Secondary | ICD-10-CM

## 2013-01-04 DIAGNOSIS — I4891 Unspecified atrial fibrillation: Secondary | ICD-10-CM

## 2013-01-04 DIAGNOSIS — I513 Intracardiac thrombosis, not elsewhere classified: Secondary | ICD-10-CM

## 2013-01-04 NOTE — Telephone Encounter (Signed)
Left message for patient to call and reschedule with scott.

## 2013-01-06 NOTE — Telephone Encounter (Signed)
Patient has been rescheduled for 03/01/13.

## 2013-01-25 ENCOUNTER — Ambulatory Visit (INDEPENDENT_AMBULATORY_CARE_PROVIDER_SITE_OTHER): Payer: Medicare Other | Admitting: *Deleted

## 2013-01-25 DIAGNOSIS — Z7901 Long term (current) use of anticoagulants: Secondary | ICD-10-CM

## 2013-01-25 DIAGNOSIS — I5189 Other ill-defined heart diseases: Secondary | ICD-10-CM

## 2013-01-25 DIAGNOSIS — I4891 Unspecified atrial fibrillation: Secondary | ICD-10-CM

## 2013-01-25 DIAGNOSIS — I513 Intracardiac thrombosis, not elsewhere classified: Secondary | ICD-10-CM

## 2013-01-25 LAB — POCT INR: INR: 1.6

## 2013-01-25 MED ORDER — WARFARIN SODIUM 5 MG PO TABS: 5.0000 mg | ORAL_TABLET | ORAL | Status: DC

## 2013-01-27 ENCOUNTER — Other Ambulatory Visit: Payer: Self-pay | Admitting: Emergency Medicine

## 2013-01-27 DIAGNOSIS — I4891 Unspecified atrial fibrillation: Secondary | ICD-10-CM

## 2013-01-27 MED ORDER — LISINOPRIL 5 MG PO TABS: 5.0000 mg | ORAL_TABLET | Freq: Every day | ORAL | Status: DC

## 2013-02-02 ENCOUNTER — Other Ambulatory Visit: Payer: Self-pay

## 2013-02-02 DIAGNOSIS — I712 Thoracic aortic aneurysm, without rupture: Secondary | ICD-10-CM

## 2013-02-08 ENCOUNTER — Ambulatory Visit (INDEPENDENT_AMBULATORY_CARE_PROVIDER_SITE_OTHER): Payer: Medicare Other | Admitting: *Deleted

## 2013-02-08 DIAGNOSIS — I4891 Unspecified atrial fibrillation: Secondary | ICD-10-CM

## 2013-02-08 DIAGNOSIS — I513 Intracardiac thrombosis, not elsewhere classified: Secondary | ICD-10-CM

## 2013-02-08 DIAGNOSIS — I5189 Other ill-defined heart diseases: Secondary | ICD-10-CM

## 2013-02-08 DIAGNOSIS — Z7901 Long term (current) use of anticoagulants: Secondary | ICD-10-CM

## 2013-02-08 LAB — POCT INR: INR: 1.7

## 2013-02-18 ENCOUNTER — Ambulatory Visit: Payer: Medicare Other | Admitting: Nurse Practitioner

## 2013-02-22 ENCOUNTER — Ambulatory Visit (INDEPENDENT_AMBULATORY_CARE_PROVIDER_SITE_OTHER): Payer: Medicare Other | Admitting: *Deleted

## 2013-02-22 DIAGNOSIS — I5189 Other ill-defined heart diseases: Secondary | ICD-10-CM

## 2013-02-22 DIAGNOSIS — I4891 Unspecified atrial fibrillation: Secondary | ICD-10-CM

## 2013-02-22 DIAGNOSIS — Z7901 Long term (current) use of anticoagulants: Secondary | ICD-10-CM

## 2013-02-22 DIAGNOSIS — I513 Intracardiac thrombosis, not elsewhere classified: Secondary | ICD-10-CM

## 2013-03-01 ENCOUNTER — Encounter: Payer: Self-pay | Admitting: Nurse Practitioner

## 2013-03-01 ENCOUNTER — Ambulatory Visit (INDEPENDENT_AMBULATORY_CARE_PROVIDER_SITE_OTHER): Payer: Medicare Other | Admitting: Nurse Practitioner

## 2013-03-01 VITALS — BP 98/50 | HR 52 | Ht 70.5 in | Wt 199.8 lb

## 2013-03-01 DIAGNOSIS — I4891 Unspecified atrial fibrillation: Secondary | ICD-10-CM

## 2013-03-01 DIAGNOSIS — I480 Paroxysmal atrial fibrillation: Secondary | ICD-10-CM

## 2013-03-01 DIAGNOSIS — I5022 Chronic systolic (congestive) heart failure: Secondary | ICD-10-CM

## 2013-03-01 LAB — CBC WITH DIFFERENTIAL/PLATELET
Basophils Absolute: 0 10*3/uL (ref 0.0–0.1)
Basophils Relative: 0.2 % (ref 0.0–3.0)
Eosinophils Absolute: 0.1 10*3/uL (ref 0.0–0.7)
Eosinophils Relative: 1.3 % (ref 0.0–5.0)
HCT: 32.1 % — ABNORMAL LOW (ref 39.0–52.0)
Hemoglobin: 10.8 g/dL — ABNORMAL LOW (ref 13.0–17.0)
Lymphocytes Relative: 32.8 % (ref 12.0–46.0)
Lymphs Abs: 1.4 10*3/uL (ref 0.7–4.0)
MCHC: 33.7 g/dL (ref 30.0–36.0)
MCV: 97.5 fl (ref 78.0–100.0)
Monocytes Absolute: 0.6 10*3/uL (ref 0.1–1.0)
Monocytes Relative: 13.9 % — ABNORMAL HIGH (ref 3.0–12.0)
Neutro Abs: 2.2 10*3/uL (ref 1.4–7.7)
Neutrophils Relative %: 51.8 % (ref 43.0–77.0)
Platelets: 90 10*3/uL — ABNORMAL LOW (ref 150.0–400.0)
RBC: 3.3 Mil/uL — ABNORMAL LOW (ref 4.22–5.81)
RDW: 14 % (ref 11.5–14.6)
WBC: 4.3 10*3/uL — ABNORMAL LOW (ref 4.5–10.5)

## 2013-03-01 LAB — BASIC METABOLIC PANEL
BUN: 25 mg/dL — ABNORMAL HIGH (ref 6–23)
CO2: 28 mEq/L (ref 19–32)
Calcium: 9.1 mg/dL (ref 8.4–10.5)
Chloride: 103 mEq/L (ref 96–112)
Creatinine, Ser: 1.2 mg/dL (ref 0.4–1.5)
GFR: 64.57 mL/min (ref >60.00)
Glucose, Bld: 100 mg/dL — ABNORMAL HIGH (ref 70–99)
Potassium: 4.3 mEq/L (ref 3.5–5.1)
Sodium: 138 mEq/L (ref 135–145)

## 2013-03-01 LAB — HEPATIC FUNCTION PANEL
ALT: 14 U/L (ref 0–53)
AST: 19 U/L (ref 0–37)
Albumin: 3.4 g/dL — ABNORMAL LOW (ref 3.5–5.2)
Alkaline Phosphatase: 55 U/L (ref 39–117)
Bilirubin, Direct: 0.1 mg/dL (ref 0.0–0.3)
Total Bilirubin: 0.7 mg/dL (ref 0.3–1.2)
Total Protein: 8.2 g/dL (ref 6.0–8.3)

## 2013-03-01 MED ORDER — FUROSEMIDE 80 MG PO TABS: 40.0000 mg | ORAL_TABLET | Freq: Every day | ORAL | Status: DC

## 2013-03-01 MED ORDER — METOPROLOL TARTRATE 25 MG PO TABS: 25.0000 mg | ORAL_TABLET | Freq: Two times a day (BID) | ORAL | Status: DC

## 2013-03-01 NOTE — Progress Notes (Signed)
Jimmy Myers Date of Birth: 03-09-1932 Medical Record #[id number]  History of Present Illness: Jimmy Myers is seen back today for a 4 month check. Seen for Dr. Johney Frame.  He has systolic heart failure with an EF down to 20 to 25% with improvement after medical therapy - up to 50% per echo in August of 2013. His other issues include atrial fib with prior cardioversion, bradycardia, enlarged ascending aorta (seen by Dr. Tyrone Sage with follow up scanning this summer 2014), thyroid nodules (with benign biopsy) and HTN.   He comes back today. He is here with his wife. Doing ok. Trying to be active despite the heat. Not dizzy or lightheaded. No passing out spells. Rare skip in his heart beat noted. Not short of breath. No swelling. Needs lopressor and lasix refilled. BP and HR diary are ok.   Current Outpatient Prescriptions  Medication Sig Dispense Refill  . clorazepate (TRANXENE) 3.75 MG tablet Take 3.75 mg by mouth daily as needed. For anxiety      . furosemide (LASIX) 80 MG tablet Take 0.5 tablets (40 mg total) by mouth daily.  90 tablet  3  . lisinopril (PRINIVIL,ZESTRIL) 5 MG tablet Take 1 tablet (5 mg total) by mouth daily.  90 tablet  3  . metoprolol tartrate (LOPRESSOR) 25 MG tablet Take 1 tablet (25 mg total) by mouth 2 (two) times daily.  60 tablet  11  . nitroGLYCERIN (NITROSTAT) 0.4 MG SL tablet Place 1 tablet (0.4 mg total) under the tongue every 5 (five) minutes x 3 doses as needed for chest pain.  30 tablet  0  . omeprazole (PRILOSEC) 20 MG capsule Take 1 capsule (20 mg total) by mouth daily.  30 capsule  0  . polyethylene glycol (MIRALAX / GLYCOLAX) packet Take 17 g by mouth daily.      . potassium chloride SA (K-DUR,KLOR-CON) 20 MEQ tablet Take 2 tablets (40 mEq total) by mouth daily.  60 tablet  0  . warfarin (COUMADIN) 5 MG tablet Take 1 tablet (5 mg total) by mouth as directed.  120 tablet  1   No current facility-administered medications for this visit.    Allergies  Allergen  Reactions  . Codeine Other (See Comments)    nervousness  . Simvastatin Other (See Comments)    unknown  . Vicodin (Hydrocodone-Acetaminophen) Other (See Comments)    nervousness    Past Medical History  Diagnosis Date  . Systolic congestive heart failure with reduced left ventricular function, NYHA class 1     EF is 20 to 25% per echo; felt to be due to reversible tachycardia induced CM  . A-fib May 2013    s/p TEE/cardioversion June 2013  . Anxiety   . Pneumonia May 2013  . HTN (hypertension)   . Thrombocytopenia   . Chronic anticoagulation     on coumadin - checked in [place]  . Aortic root enlargement     at least 5cm by CT  . Bronchitis   . Atrial thrombus   . Bradycardia     Past Surgical History  Procedure Laterality Date  . Knee arthroscopy  rt knee  . Prostate surgery    . Tee without cardioversion  12/30/2011    Procedure: TRANSESOPHAGEAL ECHOCARDIOGRAM (TEE);  Surgeon: Lewayne Bunting, MD;  Location[facility] ENDOSCOPY;  Service: Cardiovascular;  Laterality: N/A;  . Cardioversion  12/30/2011    Procedure: CARDIOVERSION;  Surgeon: Lewayne Bunting, MD;  Location: [facility] ENDOSCOPY;  Service: Cardiovascular;  Laterality: N/A;  .  Esophagogastroduodenoscopy  12/30/2011    Procedure: ESOPHAGOGASTRODUODENOSCOPY (EGD);  Surgeon: Lewayne Bunting, MD;  Location: [facility] ENDOSCOPY;  Service: Cardiovascular;  Laterality: N/A;  . Esophagogastroduodenoscopy  12/30/2011    Procedure: ESOPHAGOGASTRODUODENOSCOPY (EGD);  Surgeon: Hart Carwin, MD;  Location: [facility] ENDOSCOPY;  Service: Endoscopy;  Laterality: N/A;  . Tee without cardioversion  02/16/2012    Procedure: TRANSESOPHAGEAL ECHOCARDIOGRAM (TEE);  Surgeon: Lewayne Bunting, MD;  Location: [facility] ENDOSCOPY;  Service: Cardiovascular;  Laterality: N/A;  . Cardioversion  02/16/2012    Procedure: CARDIOVERSION;  Surgeon: Lewayne Bunting, MD;  Location: [facility] ENDOSCOPY;  Service: Cardiovascular;  Laterality: N/A;    History  Smoking status  . Never  Smoker   Smokeless tobacco  . Not on file    History  Alcohol Use No    Family History  Problem Relation Age of Onset  . Cancer Brother     stomach  . Heart disease Mother   . Heart disease Father   . Heart disease Sister   . Heart disease Brother   . Hypertension Sister     Review of Systems: The review of systems is per the HPI.  All other systems were reviewed and are negative.  Physical Exam: BP 98/50  Pulse 52  Ht 5' 10.5" (1.791 m)  Wt 199 lb 12.8 oz (90.629 kg)  BMI 28.25 kg/m2 Patient is very pleasant and in no acute distress. Skin is warm and dry. Skin is weathered and tan. Few bruises noted. olor is normal.  HEENT is unremarkable. Normocephalic/atraumatic. PERRL. Sclera are nonicteric. Neck is supple. No masses. No JVD. Lungs are clear. Cardiac exam shows a regular rate and rhythm. Abdomen is soft. Extremities are without edema. Gait and ROM are intact. No gross neurologic deficits noted.  LABORATORY DATA: Lab Results  Component Value Date   WBC 4.3* 03/10/2012   HGB 11.0* 03/10/2012   HCT 32.7* 03/10/2012   PLT 98.0* 03/10/2012   GLUCOSE 107* 07/22/2012   CHOL 85 12/29/2011   TRIG 30 12/29/2011   HDL 40 12/29/2011   LDLCALC 39 12/29/2011   ALT 34 12/29/2011   AST 23 12/29/2011   NA 138 07/22/2012   K 4.1 07/22/2012   CL 102 07/22/2012   CREATININE 1.1 07/22/2012   BUN 31* 07/22/2012   CO2 30 07/22/2012   TSH 0.498 12/28/2011   INR 2.7 02/22/2013   Echo Study Conclusions from August 2013  - Left ventricle: The cavity size was mildly dilated. Wall thickness was increased in a pattern of mild LVH. The estimated ejection fraction was 50%. Diffuse hypokinesis. - Aortic valve: Sclerosis without stenosis. Mild regurgitation. - Aorta: The aortic root and ascending aorta are dilated at 50mm. - Left atrium: The atrium was mildly dilated. - Pulmonary arteries: PA peak pressure: 31mm Hg (S).    Assessment / Plan: 1. PAF - in sinus by exam today. Remains  bradycardic but is totally asymptomatic. Would keep on current regimen for now.   2. Chronic coumadin therapy   3. Systolic heart failure - last EF in August 2013 with improvement - I have cut his Lasix back to just 40 mg a day. If his swelling worsens, he will go back to the full dose. Check labs today.   4. Enlarged aorta - followed by Dr. Tyrone Sage - sees him later this month.   See Dr. Johney Frame in 6 months. Meds are refilled today. Lasix is cut back. Check labs today.   Patient is agreeable to this plan  and will call if any problems develop in the interim.   Rosalio Macadamia, RN, ANP-C [facility] [address] [place], [place]  [address]

## 2013-03-01 NOTE — Patient Instructions (Addendum)
Stay on your current medicines EXCEPT cut the Lasix back to just a half a pill - you may go back to the whole pill if your swelling gets worse  Try to avoid salt  Stay active  Keep a check on your BP and heart rate  Let us know if you have any dizzy or lightheaded spells  See Dr. Johney Frame in 6 months  Call the [facility] office at [phone] if you have any questions, problems or concerns.     T

## 2013-03-02 ENCOUNTER — Other Ambulatory Visit: Payer: Self-pay | Admitting: *Deleted

## 2013-03-16 ENCOUNTER — Other Ambulatory Visit: Payer: Self-pay

## 2013-03-16 MED ORDER — NITROGLYCERIN 0.4 MG SL SUBL: 0.4000 mg | SUBLINGUAL_TABLET | SUBLINGUAL | Status: DC | PRN

## 2013-03-22 ENCOUNTER — Ambulatory Visit (INDEPENDENT_AMBULATORY_CARE_PROVIDER_SITE_OTHER): Payer: Medicare Other | Admitting: *Deleted

## 2013-03-22 ENCOUNTER — Other Ambulatory Visit: Payer: Self-pay | Admitting: Cardiothoracic Surgery

## 2013-03-22 DIAGNOSIS — I4891 Unspecified atrial fibrillation: Secondary | ICD-10-CM

## 2013-03-22 DIAGNOSIS — I513 Intracardiac thrombosis, not elsewhere classified: Secondary | ICD-10-CM

## 2013-03-22 DIAGNOSIS — I5189 Other ill-defined heart diseases: Secondary | ICD-10-CM

## 2013-03-22 DIAGNOSIS — Z7901 Long term (current) use of anticoagulants: Secondary | ICD-10-CM

## 2013-03-22 LAB — BUN: BUN: 19 mg/dL (ref 6–23)

## 2013-03-22 LAB — CREATININE, SERUM: Creat: 1 mg/dL (ref 0.50–1.35)

## 2013-03-22 LAB — POCT INR: INR: 2.9

## 2013-03-24 ENCOUNTER — Ambulatory Visit (HOSPITAL_COMMUNITY): Payer: Medicare Other | Attending: Family Medicine | Admitting: Radiology

## 2013-03-24 ENCOUNTER — Ambulatory Visit (INDEPENDENT_AMBULATORY_CARE_PROVIDER_SITE_OTHER): Payer: Medicare Other | Admitting: Cardiothoracic Surgery

## 2013-03-24 ENCOUNTER — Ambulatory Visit
Admission: RE | Admit: 2013-03-24 | Discharge: 2013-03-24 | Disposition: A | Discharge status: Home / Self Care | Payer: Medicare Other | Source: Ambulatory Visit | Attending: Cardiothoracic Surgery | Admitting: Cardiothoracic Surgery

## 2013-03-24 ENCOUNTER — Other Ambulatory Visit: Payer: Self-pay | Admitting: *Deleted

## 2013-03-24 ENCOUNTER — Encounter: Payer: Self-pay | Admitting: Cardiothoracic Surgery

## 2013-03-24 VITALS — BP 119/60 | HR 50 | Resp 18 | Ht 70.5 in | Wt 199.0 lb

## 2013-03-24 DIAGNOSIS — I079 Rheumatic tricuspid valve disease, unspecified: Secondary | ICD-10-CM | POA: Insufficient documentation

## 2013-03-24 DIAGNOSIS — I5022 Chronic systolic (congestive) heart failure: Secondary | ICD-10-CM

## 2013-03-24 DIAGNOSIS — I4891 Unspecified atrial fibrillation: Secondary | ICD-10-CM

## 2013-03-24 DIAGNOSIS — I712 Thoracic aortic aneurysm, without rupture, unspecified: Secondary | ICD-10-CM | POA: Insufficient documentation

## 2013-03-24 DIAGNOSIS — I080 Rheumatic disorders of both mitral and aortic valves: Secondary | ICD-10-CM | POA: Insufficient documentation

## 2013-03-24 DIAGNOSIS — I509 Heart failure, unspecified: Secondary | ICD-10-CM | POA: Insufficient documentation

## 2013-03-24 DIAGNOSIS — I10 Essential (primary) hypertension: Secondary | ICD-10-CM | POA: Insufficient documentation

## 2013-03-24 DIAGNOSIS — I7789 Other specified disorders of arteries and arterioles: Secondary | ICD-10-CM

## 2013-03-24 MED ORDER — IOHEXOL 350 MG/ML SOLN
80.0000 mL | Freq: Once | INTRAVENOUS | Status: AC | PRN
  Administered 2013-03-24: 80 mL via INTRAVENOUS

## 2013-03-24 NOTE — Progress Notes (Signed)
[address]       [place] [address]             [phone]         [name] [place] Medical Record #[id number] Date of Birth: Sep 13, 1931  Referring: Hillis Range, MD Primary Care: Josue Hector, MD  Chief Complaint:    Chief Complaint  Patient presents with  . Follow-up    6 month f/u CTA Chest, surveillance on thoracic aortic aneurysm    History of Present Illness:    Patient is a 79 year old male who presented to cardiology in April of 2013 with increasing congestive heart failure, chronically elevated heart rate with atrial fibrillation in the 120s to 130s and left atrial clot. Since that time he's been started on Coumadin treated for heart failure, had a cardioversion and is maintaining sinus rhythm. His echo evidence of LV function has markedly improved. In his evaluation he was incidentally noted to have a dilated descending aorta. He comes to the office today with follow up cta of the chest surgical evaluation. He has not had a recent cardiac catheterization.   Since last seen patient complains of overall weakness, "has good days and bad days". Complains he does not like taking his blood pressure medication.  He has not had an echocardiogram since August of 2013 at which time he had an ejection fraction was noted to have increased from 25% to 50% compared to the echocardiogram done in June of 2013.   Current Activity/ Functional Status: Patient is independent with mobility/ambulation, transfers, ADL's, IADL's.   Past Medical History  Diagnosis Date  . Systolic congestive heart failure with reduced left ventricular function, NYHA class 1     EF is 20 to 25% per echo; felt to be due to reversible tachycardia induced CM  . A-fib May 2013    s/p TEE/cardioversion June 2013  . Anxiety   . Pneumonia May 2013  . HTN (hypertension)   . Thrombocytopenia   . Chronic anticoagulation     on coumadin - checked in [place]  .  Aortic root enlargement     at least 5cm by CT  . Bronchitis   . Atrial thrombus   . Bradycardia     Past Surgical History  Procedure Laterality Date  . Knee arthroscopy  rt knee  . Prostate surgery    . Tee without cardioversion  12/30/2011    Procedure: TRANSESOPHAGEAL ECHOCARDIOGRAM (TEE);  Surgeon: Lewayne Bunting, MD;  Location: [facility] ENDOSCOPY;  Service: Cardiovascular;  Laterality: N/A;  . Cardioversion  12/30/2011    Procedure: CARDIOVERSION;  Surgeon: Lewayne Bunting, MD;  Location: [facility] ENDOSCOPY;  Service: Cardiovascular;  Laterality: N/A;  . Esophagogastroduodenoscopy  12/30/2011    Procedure: ESOPHAGOGASTRODUODENOSCOPY (EGD);  Surgeon: Lewayne Bunting, MD;  Location: [facility] ENDOSCOPY;  Service: Cardiovascular;  Laterality: N/A;  . Esophagogastroduodenoscopy  12/30/2011    Procedure: ESOPHAGOGASTRODUODENOSCOPY (EGD);  Surgeon: Hart Carwin, MD;  Location: [facility] ENDOSCOPY;  Service: Endoscopy;  Laterality: N/A;  . Tee without cardioversion  02/16/2012    Procedure: TRANSESOPHAGEAL ECHOCARDIOGRAM (TEE);  Surgeon: Lewayne Bunting, MD;  Location: [facility] ENDOSCOPY;  Service: Cardiovascular;  Laterality: N/A;  . Cardioversion  02/16/2012    Procedure: CARDIOVERSION;  Surgeon: Lewayne Bunting, MD;  Location: [facility] ENDOSCOPY;  Service: Cardiovascular;  Laterality: N/A;    Family History  Problem Relation Age of  Onset  . Cancer Brother     stomach  . Heart disease Mother 42  . Heart disease Father   . Heart disease Sister   . Heart disease Brother 76 died mi, and 35 died of mi  . Hypertension Sister       History  Smoking status  . Never Smoker   Smokeless tobacco  . Not on file    History  Alcohol Use No     Allergies  Allergen Reactions  . Codeine Other (See Comments)    nervousness  . Simvastatin Other (See Comments)    unknown  . Vicodin (Hydrocodone-Acetaminophen) Other (See Comments)    nervousness    Current Outpatient Prescriptions  Medication Sig Dispense Refill    . clorazepate (TRANXENE) 3.75 MG tablet Take 3.75 mg by mouth daily as needed. For anxiety      . furosemide (LASIX) 80 MG tablet Take 0.5 tablets (40 mg total) by mouth daily.  90 tablet  3  . lisinopril (PRINIVIL,ZESTRIL) 5 MG tablet Take 1 tablet (5 mg total) by mouth daily.  90 tablet  3  . metoprolol tartrate (LOPRESSOR) 25 MG tablet Take 1 tablet (25 mg total) by mouth 2 (two) times daily.  60 tablet  11  . nitroGLYCERIN (NITROSTAT) 0.4 MG SL tablet Place 1 tablet (0.4 mg total) under the tongue every 5 (five) minutes x 3 doses as needed for chest pain.  30 tablet  6  . polyethylene glycol (MIRALAX / GLYCOLAX) packet Take 17 g by mouth daily.      Marland Kitchen warfarin (COUMADIN) 5 MG tablet Take 1 tablet (5 mg total) by mouth as directed.  120 tablet  1  . omeprazole (PRILOSEC) 20 MG capsule Take 1 capsule (20 mg total) by mouth daily.  30 capsule  0  . potassium chloride SA (K-DUR,KLOR-CON) 20 MEQ tablet Take 2 tablets (40 mEq total) by mouth daily.  60 tablet  0   No current facility-administered medications for this visit.       Review of Systems:     Cardiac Review of Systems: Y or N  Chest Pain [ n   ]  Resting SOB [  n ] Exertional SOB  [better  ]  Orthopnea [ better ]   Pedal Edema [ y  ]    Palpitations Cove.Etienne  ] Syncope  Milo.Brash  ]   Presyncope [ n  ]  General Review of Systems: [Y] = yes [  ]=no Constitional: recent weight change Cove.Etienne  ]; anorexia [  ]; fatigue [  ]; nausea [  ]; night sweats [ n ]; fever [ n ]; or chills [ n ];                                                                                                                                          Dental: poor dentition[y  ];  Last Dentist visit:couple years  Eye : blurred vision [  ]; diplopia [   ]; vision changes [  ];  Amaurosis fugax[  ]; Resp: cough [  ];  wheezing[ n ];  hemoptysis[  ]; shortness of breath[  ]; paroxysmal nocturnal dyspnea[  ]; dyspnea on exertion[  ]; or orthopnea[  ];  GI:  gallstones[  ], vomiting[  ];   dysphagia[  ]; melena[  ];  hematochezia [  ]; heartburn[  ];   Hx of  Colonoscopy[  ]; GU: kidney stones [  ]; hematuria[  ];   dysuria [  ];  nocturia[  ];  history of     obstruction [  ];             Skin: rash, swelling[  ];, hair loss[  ];  peripheral edema[  ];  or itching[  ]; Musculosketetal: myalgias[  ];  joint swelling[  ];  joint erythema[  ];  joint pain[  ];  back pain[  ];  Heme/Lymph: bruising[  ];  bleeding[  ];  anemia[  ];  Neuro: TIA[  ];  headaches[  ];  stroke[  ];  vertigo[  ];  seizures[  ];   paresthesias[  ];  difficulty walking[  ];  Psych:depression[  ]; anxiety[  ];  Endocrine: diabetes[  ];  thyroid dysfunction[  ];  Immunizations: Flu Milo.Brash  ]; Pneumococcal[  n];  Other:  Physical Exam: BP 119/60  Pulse 50  Resp 18  Ht 5' 10.5" (1.791 m)  Wt 199 lb (90.266 kg)  BMI 28.14 kg/m2  SpO2 97%  General appearance: alert, cooperative and no distress Neurologic: intact Heart: regular rate and rhythm, S1, S2 normal, no murmur, click, rub or gallop and normal apical impulse Lungs: clear to auscultation bilaterally and normal percussion bilaterally Abdomen: soft, non-tender; bowel sounds normal; no masses,  no organomegaly Extremities: extremities normal, atraumatic, no cyanosis or edema and Homans sign is negative, no sign of DVT Patient has no carotid bruits, has no cervical supraclavicular adenopathy, has full DP PT radial brachial and femoral pulses Patient appears to be in sinus rhythm on exam I do not appreciate any murmur of aortic insufficiency He has a known thyroid mass 5 cm in size but I cannot appreciate this on physical exam Patient is ambulatory but has a slow wide stance shuffling gait  Diagnostic Studies & Laboratory data:     Recent Radiology Findings:   Ct Angio Chest Aorta W/cm &/or Wo/cm  03/24/2013   *RADIOLOGY REPORT*  Clinical Data: Known thoracic aortic aneurysm  CT ANGIOGRAPHY CHEST  Technique:  Multidetector CT imaging of the chest using  the standard protocol during bolus administration of intravenous contrast. Multiplanar reconstructed images including MIPs were obtained and reviewed to evaluate the vascular anatomy.  Contrast: 80mL OMNIPAQUE IOHEXOL 350 MG/ML SOLN  Comparison: 01/13/2012  Findings: The lungs are well-aerated bilaterally.  Some minimal scarring is noted in the left lung base stable from prior exam.  No sizable parenchymal nodule is seen.  No focal infiltrate is noted.  There are changes in the thyroid gland with a large left thyroid nodule which is increased in the interval from prior exam now measuring almost 5 cm.  It previously measured 3.8 cm.  Small right- sided thyroid nodules are noted as well.  There is prominence of the ascending aorta again identified.  When measured at a similar level to that on the prior exam the aorta and now  measures 4.6 x 4.2 cm in greatest transverse and AP dimensions respectively.  It previously measured 4.2 x 4.0 cm in similar dimensions.  At the level of the coronary arteries the aortic root measures 5.8 cm this is best visualized on the coronal imaging. It previously measured 5.1 cm in this region.  The aorta tapers in a normal fashion after the origins of the great vessels.  A tricuspid aortic valve is noted.  Heavy coronary calcifications are noted in the left main coronary artery left anterior descending coronary artery.  Lesser calcifications are noted in the right coronary artery.  No significant hilar or mediastinal adenopathy is noted.  The visualized portions of the pulmonary artery are within normal limits.  Scanning into the upper abdomen reveals a right renal cyst which is stable from prior exam.  A few small dependent gallstones are now visualized.  Bony structures are within normal limits.  IMPRESSION: Dilatation of the aortic root and ascending aorta which has increased in the interval from prior exam as described above.  Enlarging thyroid nodules particularly on the left.    Original Report Authenticated By: Alcide Clever, M.D.     Ct Angio Chest Aortic Dissect W &/or W/o  08/16/2012  *RADIOLOGY REPORT*  Clinical Data: Enlarged aorta  CT ANGIOGRAPHY CHEST  Technique:  Multidetector CT imaging of the chest using the standard protocol during bolus administration of intravenous contrast. Multiplanar reconstructed images including MIPs were obtained and reviewed to evaluate the vascular anatomy.  Contrast: OMNIPAQUE IOHEXOL 350 MG/ML SOLN  Comparison: 01/13/2012  Findings: Dilatation of the aortic root is again noted.  Diameters of the aorta at the sinus of Valsalva, sinotubular junction, and in the ascending aorta are 5.2, 4.5, and 4.6 cm respectively. Previously, the aortic root was 5.1 cm in diameter. Diameters at the aortic arch and descending aorta are 3.1 and 3.6 cm.  The innominate artery is tortuous and ectatic with maximal diameter of 1.6 cm.  Right subclavian and common carotid arteries are patent.  Mild calcified atherosclerotic change at the origin of the right vertebral artery.  Left common carotid and subclavian arteries are patent.  Atherosclerotic changes at the origin of the left vertebral artery are present making degree of narrowing at its origin difficult.  Coronary artery opacification is difficult due to motion artifact. Atherosclerotic changes at the right coronary artery branches, left main coronary artery, left anterior descending coronary artery are present.  Cardiomegaly is present.  The heart is dilated.  The left ventricle, in particular is significantly dilated without significant calcification of the aortic valve suggesting aortic valve insufficiency. Left atrium is not particularly dilated.  No obvious filling defects in the pulmonary arterial tree to suggest acute pulmonary thromboembolism.  The left lobe of the thyroid mass has further enlarged measuring 5.3 x 3.3  centimeters on image 9.  Negative abnormal mediastinal adenopathy.  Negative  pericardial effusion.  No evidence of pulmonary parenchymal mass or consolidation.  No pneumothorax.  No pleural effusion  Calcified central disc herniation neck T9-10 causes an element of spinal stenosis.  Diffuse hepatic steatosis.  Tiny hypodensity in the left lobe of the liver is stable.  Stable benign appearing right renal cyst.  Small gallstone.  Atherosclerotic changes at the origin of the SMA without significant narrowing.  Severe narrowing in the proximal celiac.  IMPRESSION: Stable dilatation of the aortic root.  Diameter at the sinus of Valsalva is 5.2 cm.  There is dilatation of the left ventricle without significant valvular  calcifications suggesting aortic valve regurgitation.  Left thyroid mass has further enlarged.  Correlation with sonography is recommended.  Cholelithiasis.  Stable incidental findings.   Original Report Authenticated By: Jolaine Click, M.D.    Recent Lab Findings: Lab Results  Component Value Date   WBC 4.3* 03/01/2013   HGB 10.8* 03/01/2013   HCT 32.1* 03/01/2013   PLT 90.0* 03/01/2013   GLUCOSE 100* 03/01/2013   CHOL 85 12/29/2011   TRIG 30 12/29/2011   HDL 40 12/29/2011   LDLCALC 39 12/29/2011   ALT 14 03/01/2013   AST 19 03/01/2013   NA 138 03/01/2013   K 4.3 03/01/2013   CL 103 03/01/2013   CREATININE 1.00 03/22/2013   BUN 19 03/22/2013   CO2 28 03/01/2013   TSH 0.498 12/28/2011   INR 2.9 03/22/2013   Aortic Size Index=      5.2   /Body surface area is 2.12 meters squared. =2.45  < 2.75 cm/m2      4% risk per year 2.75 to 4.25          8% risk per year > 4.25 cm/m2    20% risk per year   Assessment / Plan:   Aortic root enlargement  With out AI, at the sinus  5.2-5.6 cm mid ascending aorta 4.5 cm, with some enlargement at root. History of Atrial fibrillation  Maintaining sinus rhythm  Continues  coumadin Chronic systolic heart failure hypertension  Multiple thyroid nodules Since last seen the patient seems to be walking slower, more for wide stance gait and needs his  wife to keep his thoughts straight.    I have reviewed with the patient and his wife the current scan risks and options involved with elective replacement of his aortic root at age 40. The rest of her rupture and/or dissection have been discussed with them I will arrange for a followup echocardiogram since the last one was approximately one year ago to evaluate both his aortic root aortic valve and to evaluate his LV function which is in the past documented as low as 20-25%  Return 6 months with follow up CTA Will get echo to evaluate root ,aortic valve and lv function in next week    Delight Ovens MD  Beeper [phone] Office [phone] 03/24/2013 9:47 AM

## 2013-03-24 NOTE — Progress Notes (Addendum)
Echocardiogram performed. Report faxed

## 2013-03-24 NOTE — Patient Instructions (Signed)
Thoracic Aortic Aneurysm An aneurysm is the enlargement (dilatation), bulging or ballooning out of part of the wall of a vein or artery. An Aortic Aneurysm is a bulging in the largest artery of the body. This artery supplies blood from the heart to the rest of the body. The first part of the aorta is called the thoracic aorta. It leaves the heart, ascends (rises), arches, and descends (goes down) through the chest until it reaches the diaphragm (the muscular partition between the chest and abdomen (belly). The second part of the aorta is then called the abdominal aorta after it has passed the diaphragm and continues down through the abdomen. The abdominal aorta ends where it splits to form the two iliac arteries that go to the legs. Aortic aneurysms can develop anywhere along the length of the aorta. A thoracic aortic aneurysm (TAA) occurs in the first part of the aorta, between the heart and the diaphragm. The major importance of an aneurysm is that it can rupture or tear (dissect), causing death unless diagnosed and treated promptly. CAUSES  Most thoracic aortic aneurysms are related to arteriosclerosis. The arteriosclerosis can weaken the aortic wall. The pressure of the blood being pumped through the aorta causes it to balloon out at the site of weakness. Therefore, elevated blood pressure (hypertension) is associated with aneurysm. Other risk factors include:  Age over 60.  Tobacco use.  Male sex.  Family history of aneurysm. Additional causes of thoracic aortic aneurysms include:  Genetics (passed by birth).  Injury: After physical trauma to the aorta.  Inflammation of blood vessels.  Hardening of the arteries.  Infection. SYMPTOMS  Many aneurysms do not cause problems. A small, unchanging or slowly changing aneurysm may produce no symptoms until it suddenly ruptures or dissects (separation of the layers of the aortic wall) without warning. It may then cause death. The symptoms  (problems) of a developing aneurysm will partly depend on its size and rate of growth. Thoracic aortic aneurysms may cause pain in the:  Chest.  Back.  Sides.  Abdomen. The pain most often has a deep quality as if it is boring into the person. It may cause:  Heart failure.  Heart attack.  Hoarseness.  Cough.  Shortness of breath.  Swallowing problems. DIAGNOSIS  A thoracic aortic aneurysm may be suspected based on your symptoms. It may also be detected by x-ray or CT studies done for unrelated reasons.  Several different imaging studies can be used to confirm a TAA:  An echocardiogram is an ultrasound test to examine the heart. It can also examine the first parts of the aorta. Sometimes, this test is done by putting you to sleep and inserting a flexible telescope through your mouth into your esophagus, which is next to the aorta; excellent pictures of the aorta can be obtained. This is called a transesophageal echocardiogram (TEE).  CT scanning of the chest is accurate at showing the exact size and shape of the aneurysm.  MRI scanning is accurate, and is used for certain types of TAA.  An aortic angiogram shows the source of the major blood vessels arising from the aorta. It reveals the size and extent of any aneurysm. It can also show a clot clinging to the wall of the aneurysm (mural thrombus). The angiogram may give information about a tear of the aorta. TREATMENT  Treatment for a thoracic aortic aneurysm depends on:  Location.  Size.  Other factors.  Rate of growth.  Underlying cause. Medical treatment is used   for smaller or complicated aneurysms, or those that do not cause symptoms. These include:  Stopping smoking.  Blood pressure control.  Control of cholesterol. Surgical treatment is used for aneurysms that cause symptoms, or for those that are large or growing in size. The surgical technique depends on the location of the aneurysm. HOME CARE INSTRUCTIONS     If you smoke, stop. If you do not, do not start.  Take all medications as prescribed.  Your caregiver will tell you when to have your aneurysm rechecked, either by echocardiogram or CT scan. Be sure to keep this and all follow-up appointments. SEEK MEDICAL CARE IF:   You develop mild pain in your chest, upper back, sides, or abdomen.  You develop cough, hoarseness or trouble swallowing. SEEK IMMEDIATE MEDICAL CARE IF:   You develop severe chest or abdominal pain, or severe pain moving (radiating) to your back.  You suddenly develop cold or blue toes or feet.  You suddenly develop lightheadedness or fainting spells.  You develop trouble breathing. Document Released: 08/18/2005 Document Revised: 11/10/2011 Document Reviewed: 07/07/2007 [facility] Patient Information 2014 [place], [place].

## 2013-04-19 ENCOUNTER — Ambulatory Visit (INDEPENDENT_AMBULATORY_CARE_PROVIDER_SITE_OTHER): Payer: Medicare Other | Admitting: *Deleted

## 2013-04-19 DIAGNOSIS — I4891 Unspecified atrial fibrillation: Secondary | ICD-10-CM

## 2013-04-19 DIAGNOSIS — I513 Intracardiac thrombosis, not elsewhere classified: Secondary | ICD-10-CM

## 2013-04-19 DIAGNOSIS — Z7901 Long term (current) use of anticoagulants: Secondary | ICD-10-CM

## 2013-04-19 LAB — POCT INR: INR: 2.6

## 2013-05-31 ENCOUNTER — Ambulatory Visit (INDEPENDENT_AMBULATORY_CARE_PROVIDER_SITE_OTHER): Payer: Medicare Other | Admitting: *Deleted

## 2013-05-31 DIAGNOSIS — Z7901 Long term (current) use of anticoagulants: Secondary | ICD-10-CM

## 2013-05-31 DIAGNOSIS — I5189 Other ill-defined heart diseases: Secondary | ICD-10-CM

## 2013-05-31 DIAGNOSIS — I513 Intracardiac thrombosis, not elsewhere classified: Secondary | ICD-10-CM

## 2013-05-31 DIAGNOSIS — I4891 Unspecified atrial fibrillation: Secondary | ICD-10-CM

## 2013-05-31 LAB — POCT INR: INR: 2.6

## 2013-06-14 ENCOUNTER — Encounter: Payer: Self-pay | Admitting: Internal Medicine

## 2013-07-11 ENCOUNTER — Telehealth: Payer: Self-pay | Admitting: Nurse Practitioner

## 2013-07-11 NOTE — Telephone Encounter (Signed)
New Problem  Pt states that he has not felt well for the last 5-6 days/// Nose bleeds, Irregular heart beat// Requests same day appt// offered 11/12 @ 11am// Pt declined//please assist.

## 2013-07-11 NOTE — Telephone Encounter (Signed)
Patient called stating that he has been feeling "worse" lately and his PCP said his heart rate is irregular. Patient states he feels a "pounding" sensation at times along with occasional mild SOB and mild dizziness. Denies distress or other symptoms. States he had a slight nosebleed yesterday morning but it was not prolonged. Would like to be seen as soon as possible. Appt scheduled with Tereso Newcomer, PA, for 9:50am on Wed, Nov 12th.

## 2013-07-12 ENCOUNTER — Ambulatory Visit (INDEPENDENT_AMBULATORY_CARE_PROVIDER_SITE_OTHER): Payer: Medicare Other | Admitting: *Deleted

## 2013-07-12 DIAGNOSIS — I4891 Unspecified atrial fibrillation: Secondary | ICD-10-CM

## 2013-07-12 DIAGNOSIS — Z7901 Long term (current) use of anticoagulants: Secondary | ICD-10-CM

## 2013-07-12 DIAGNOSIS — I5189 Other ill-defined heart diseases: Secondary | ICD-10-CM

## 2013-07-12 DIAGNOSIS — I513 Intracardiac thrombosis, not elsewhere classified: Secondary | ICD-10-CM

## 2013-07-12 LAB — POCT INR: INR: 2.3

## 2013-07-13 ENCOUNTER — Ambulatory Visit (INDEPENDENT_AMBULATORY_CARE_PROVIDER_SITE_OTHER): Payer: Medicare Other | Admitting: Physician Assistant

## 2013-07-13 ENCOUNTER — Other Ambulatory Visit: Payer: Self-pay | Admitting: *Deleted

## 2013-07-13 ENCOUNTER — Encounter: Payer: Self-pay | Admitting: Physician Assistant

## 2013-07-13 VITALS — BP 130/68 | HR 49 | Ht 70.5 in | Wt 199.0 lb

## 2013-07-13 DIAGNOSIS — R5381 Other malaise: Secondary | ICD-10-CM

## 2013-07-13 DIAGNOSIS — I7789 Other specified disorders of arteries and arterioles: Secondary | ICD-10-CM

## 2013-07-13 DIAGNOSIS — R002 Palpitations: Secondary | ICD-10-CM

## 2013-07-13 DIAGNOSIS — I10 Essential (primary) hypertension: Secondary | ICD-10-CM

## 2013-07-13 DIAGNOSIS — R5383 Other fatigue: Secondary | ICD-10-CM

## 2013-07-13 DIAGNOSIS — I4891 Unspecified atrial fibrillation: Secondary | ICD-10-CM

## 2013-07-13 DIAGNOSIS — I5022 Chronic systolic (congestive) heart failure: Secondary | ICD-10-CM

## 2013-07-13 MED ORDER — WARFARIN SODIUM 5 MG PO TABS: ORAL_TABLET | ORAL | Status: DC

## 2013-07-13 NOTE — Progress Notes (Signed)
[address] [place], [place]  [address] Phone: [phone] Fax:  [phone]  Date:  07/13/2013   ID:  Jimmy Myers, DOB [birth year], MRN [id number]  PCP:  Josue Hector, MD  Cardiologist:  Dr. Hillis Range     History of Present Illness: Jimmy Myers is a 79 y.o. male history of dilated cardiomyopathy felt to be related to tachycardia from atrial fibrillation, status post TEE guided cardioversion, systolic CHF, bradycardia, ascending aortic aneurysm followed by Dr. Tyrone Sage, HTN, thyroid nodules. Echocardiogram in 12/2011 demonstrated an EF of 20-25%. LV function improved to 50% by echocardiogram in August 2013. Most recent echo (03/24/13):  EF 45-50%, diffuse HK, grade 1 diastolic dysfunction, mild AI, fusiform dilatation of the ascending aorta from the sinuses to the root, severely dilated aorta, mild MR, mild LAE.  Last seen in this office 03/2013.  Over the last 3 weeks he has noted increased fatigue. He did see his PCP. He had some labs drawn and had an ECG. His blood pressure monitor typically shows that he has an irregular heart rhythm. He denies any rapid palpitations. He does occasionally feel hard heartbeats. He denies chest discomfort. He denies significant dyspnea. He is probably NYHA class II-IIb. He denies orthopnea, PND or edema. He denies syncope.  Recent Labs: 03/01/2013: ALT 14; Hemoglobin 10.8*; Potassium 4.3  03/22/2013: Creatinine 1.00 07/12/2013:  Hgb 10.9 (from PCP's office)  Wt Readings from Last 3 Encounters:  07/13/13 199 lb (90.266 kg)  03/24/13 199 lb (90.266 kg)  03/01/13 199 lb 12.8 oz (90.629 kg)     Past Medical History  Diagnosis Date  . Systolic congestive heart failure with reduced left ventricular function, NYHA class 1     EF is 20 to 25% per echo; felt to be due to reversible tachycardia induced CM  . A-fib May 2013    s/p TEE/cardioversion June 2013  . Anxiety   . Pneumonia May 2013  . HTN (hypertension)   . Thrombocytopenia     . Chronic anticoagulation     on coumadin - checked in [place]  . Aortic root enlargement     at least 5cm by CT  . Bronchitis   . Atrial thrombus   . Bradycardia     Current Outpatient Prescriptions  Medication Sig Dispense Refill  . clorazepate (TRANXENE) 3.75 MG tablet Take 3.75 mg by mouth daily as needed. For anxiety      . furosemide (LASIX) 80 MG tablet Take 0.5 tablets (40 mg total) by mouth daily.  90 tablet  3  . lisinopril (PRINIVIL,ZESTRIL) 5 MG tablet Take 1 tablet (5 mg total) by mouth daily.  90 tablet  3  . metoprolol tartrate (LOPRESSOR) 25 MG tablet Take 1 tablet (25 mg total) by mouth 2 (two) times daily.  60 tablet  11  . nitroGLYCERIN (NITROSTAT) 0.4 MG SL tablet Place 1 tablet (0.4 mg total) under the tongue every 5 (five) minutes x 3 doses as needed for chest pain.  30 tablet  6  . omeprazole (PRILOSEC) 20 MG capsule Take 1 capsule (20 mg total) by mouth daily.  30 capsule  0  . polyethylene glycol (MIRALAX / GLYCOLAX) packet Take 17 g by mouth daily.      . potassium chloride SA (K-DUR,KLOR-CON) 20 MEQ tablet Take 2 tablets (40 mEq total) by mouth daily.  60 tablet  0  . warfarin (COUMADIN) 5 MG tablet Take 1 tablet (5 mg total) by mouth as directed.  120 tablet  1   No current facility-administered medications for this visit.    Allergies:   Codeine; Simvastatin; and Vicodin   Social History:  The patient  reports that he has never smoked. He does not have any smokeless tobacco history on file. He reports that he does not drink alcohol or use illicit drugs.   Family History:  The patient's family history includes Cancer in his brother; Heart disease in his brother, father, mother, and sister; Hypertension in his sister.   ROS:  Please see the history of present illness.   He has a fairly nonproductive cough. He has occasional night sweats. He is being worked up for anemia by his PCP.   All other systems reviewed and negative.   PHYSICAL EXAM: VS:  BP 130/68   Pulse 49  Ht 5' 10.5" (1.791 m)  Wt 199 lb (90.266 kg)  BMI 28.14 kg/m2 Well nourished, well developed, in no acute distress HEENT: normal Neck: no JVD Cardiac:  normal S1, S2; RRR; 1/6 systolic murmur at the RUSB Lungs:  clear to auscultation bilaterally, no wheezing, rhonchi or rales Abd: soft, nontender, no hepatomegaly Ext: no edema Skin: warm and dry Neuro:  CNs 2-12 intact, no focal abnormalities noted  EKG:  Sinus bradycardia, HR 49, normal axis, nonspecific ST-T wave changes, no change from prior tracing     ASSESSMENT AND PLAN:  1. Fatigue: I suspect he may be fatigued from bradycardia. I have suggested reducing his metoprolol. He is hesitant to do this. Therefore, I will keep him on his current dose of metoprolol.  I will obtain a 21 day event monitor.  Request recent labs and ECG done by his PCP. 2. Bradycardia: As noted above, I suggested reducing his beta blocker dose. He is hesitant to do this and we have decided to keep him on his current dose. The event monitor will be obtained to rule out significant bradyarrhythmias, recurrent atrial fibrillation or pauses. 3. Chronic Systolic CHF:  Volume appears stable. Continue current dose of Lasix, beta blocker, ACE inhibitor. 4. Atrial Fibrillation:  Maintaining NSR. Obtain event monitor as noted. 5. Ascending Thoracic Aortic Aneurysm:  Follow up with Dr. Tyrone Sage as planned. 6. Disposition:  Follow up with Dr. Johney Frame in December as planned.  Signed, Tereso Newcomer, PA-C  07/13/2013 10:18 AM

## 2013-07-13 NOTE — Patient Instructions (Addendum)
PLEASE SCHEDULE FOR A 21 DAY EVENT HEART MONITOR; DX 427.31, 785.1  PLEASE FOLLOW UP WITH DR. ALLRED ON 08/10/13  NO MEDICATION CHANGES WERE MADE TODAY

## 2013-07-15 ENCOUNTER — Encounter (INDEPENDENT_AMBULATORY_CARE_PROVIDER_SITE_OTHER): Payer: Medicare Other

## 2013-07-15 ENCOUNTER — Encounter: Payer: Self-pay | Admitting: Radiology

## 2013-07-15 DIAGNOSIS — R002 Palpitations: Secondary | ICD-10-CM

## 2013-07-15 DIAGNOSIS — I4891 Unspecified atrial fibrillation: Secondary | ICD-10-CM

## 2013-07-15 NOTE — Progress Notes (Signed)
Patient ID: Jimmy Myers, male   DOB: 09/23/1931, 79 y.o.   MRN: [id number] lifewatch 21 day monitor applied

## 2013-07-26 ENCOUNTER — Telehealth: Payer: Self-pay | Admitting: Internal Medicine

## 2013-07-26 NOTE — Telephone Encounter (Signed)
Left patient a message to return my call

## 2013-07-26 NOTE — Telephone Encounter (Signed)
New Message  Pt called wants to be sure that it is OK for him to take iron pills along with the medications he is currently taking please call/

## 2013-07-26 NOTE — Telephone Encounter (Signed)
Okay to take iron as directed by his PCP

## 2013-08-08 ENCOUNTER — Ambulatory Visit: Payer: Medicare Other | Admitting: Internal Medicine

## 2013-08-10 ENCOUNTER — Encounter: Payer: Self-pay | Admitting: Internal Medicine

## 2013-08-10 ENCOUNTER — Ambulatory Visit (INDEPENDENT_AMBULATORY_CARE_PROVIDER_SITE_OTHER): Payer: Medicare Other | Admitting: Internal Medicine

## 2013-08-10 VITALS — BP 118/65 | HR 50 | Ht 70.0 in | Wt 200.1 lb

## 2013-08-10 DIAGNOSIS — I5022 Chronic systolic (congestive) heart failure: Secondary | ICD-10-CM

## 2013-08-10 DIAGNOSIS — I7789 Other specified disorders of arteries and arterioles: Secondary | ICD-10-CM

## 2013-08-10 DIAGNOSIS — I4891 Unspecified atrial fibrillation: Secondary | ICD-10-CM

## 2013-08-10 DIAGNOSIS — I10 Essential (primary) hypertension: Secondary | ICD-10-CM

## 2013-08-10 NOTE — Patient Instructions (Signed)
Your physician recommends that you schedule a follow-up appointment in: 3 months with Sunday Spillers

## 2013-08-14 NOTE — Progress Notes (Signed)
PCP:  Jimmy Hector, MD  The patient presents today for routine electrophysiology followup.  Since last being seen in our clinic, the patient reports doing very well.  He has made remarkable improvement since we first met.  Today, he denies symptoms of palpitations, chest pain, shortness of breath, orthopnea, PND,  dizziness, presyncope, syncope, or neurologic sequela.  He has stable edema. He has occasional fatigue. The patient feels that he is tolerating medications without difficulties and is otherwise without complaint today.   Past Medical History  Diagnosis Date  . Systolic congestive heart failure with reduced left ventricular function, NYHA class 1     EF is 20 to 25% per echo; felt to be due to reversible tachycardia induced CM  . A-fib May 2013    s/p TEE/cardioversion June 2013  . Anxiety   . Pneumonia May 2013  . HTN (hypertension)   . Thrombocytopenia   . Chronic anticoagulation     on coumadin - checked in [place]  . Aortic root enlargement     at least 5cm by CT  . Bronchitis   . Atrial thrombus   . Bradycardia   . Diverticulosis   . Hemorrhoids    Past Surgical History  Procedure Laterality Date  . Knee arthroscopy  rt knee  . Prostate surgery    . Tee without cardioversion  12/30/2011    Procedure: TRANSESOPHAGEAL ECHOCARDIOGRAM (TEE);  Surgeon: Lewayne Bunting, MD;  Location: [facility] ENDOSCOPY;  Service: Cardiovascular;  Laterality: N/A;  . Cardioversion  12/30/2011    Procedure: CARDIOVERSION;  Surgeon: Lewayne Bunting, MD;  Location: [facility] ENDOSCOPY;  Service: Cardiovascular;  Laterality: N/A;  . Esophagogastroduodenoscopy  12/30/2011    Procedure: ESOPHAGOGASTRODUODENOSCOPY (EGD);  Surgeon: Lewayne Bunting, MD;  Location: [facility] ENDOSCOPY;  Service: Cardiovascular;  Laterality: N/A;  . Esophagogastroduodenoscopy  12/30/2011    Procedure: ESOPHAGOGASTRODUODENOSCOPY (EGD);  Surgeon: Hart Carwin, MD;  Location: [facility] ENDOSCOPY;  Service: Endoscopy;  Laterality: N/A;    . Tee without cardioversion  02/16/2012    Procedure: TRANSESOPHAGEAL ECHOCARDIOGRAM (TEE);  Surgeon: Lewayne Bunting, MD;  Location: [facility] ENDOSCOPY;  Service: Cardiovascular;  Laterality: N/A;  . Cardioversion  02/16/2012    Procedure: CARDIOVERSION;  Surgeon: Lewayne Bunting, MD;  Location: [facility] ENDOSCOPY;  Service: Cardiovascular;  Laterality: N/A;    Current Outpatient Prescriptions  Medication Sig Dispense Refill  . clorazepate (TRANXENE) 3.75 MG tablet Take 3.75 mg by mouth daily as needed. For anxiety      . Ferrous Sulfate (IRON) 325 (65 FE) MG TABS Take 1 tablet by mouth daily.      . furosemide (LASIX) 80 MG tablet Take 0.5 tablets (40 mg total) by mouth daily.  90 tablet  3  . lisinopril (PRINIVIL,ZESTRIL) 5 MG tablet Take 1 tablet (5 mg total) by mouth daily.  90 tablet  3  . metoprolol tartrate (LOPRESSOR) 25 MG tablet Take 1 tablet (25 mg total) by mouth 2 (two) times daily.  60 tablet  11  . nitroGLYCERIN (NITROSTAT) 0.4 MG SL tablet Place 1 tablet (0.4 mg total) under the tongue every 5 (five) minutes x 3 doses as needed for chest pain.  30 tablet  6  . omeprazole (PRILOSEC) 20 MG capsule Take 20 mg by mouth 2 (two) times daily.      . polyethylene glycol (MIRALAX / GLYCOLAX) packet Take 17 g by mouth daily.      . potassium chloride SA (K-DUR,KLOR-CON) 20 MEQ tablet Take 2 tablets (40  mEq total) by mouth daily.  60 tablet  0  . warfarin (COUMADIN) 5 MG tablet Take as directed by coumadin clinic  120 tablet  1   No current facility-administered medications for this visit.    Allergies  Allergen Reactions  . Codeine Other (See Comments)    nervousness  . Simvastatin Other (See Comments)    unknown  . Vicodin [Hydrocodone-Acetaminophen] Other (See Comments)    nervousness    History   Social History  . Marital Status: Married    Spouse Name: N/A    Number of Children: N/A  . Years of Education: N/A   Occupational History  . Not on file.   Social History Main  Topics  . Smoking status: Never Smoker   . Smokeless tobacco: Not on file  . Alcohol Use: No  . Drug Use: No  . Sexual Activity: Not Currently   Other Topics Concern  . Not on file   Social History Narrative  . No narrative on file    Family History  Problem Relation Age of Onset  . Cancer Brother     stomach  . Heart disease Mother   . Heart disease Father   . Heart disease Sister   . Heart disease Brother   . Hypertension Sister     Physical Exam: Filed Vitals:   08/10/13 1140  BP: 118/65  Pulse: 50  Height: 5\' 10"  (1.778 m)  Weight: 200 lb 1.9 oz (90.774 kg)    GEN- The patient is well appearing, alert and oriented x 3 today.   Head- normocephalic, atraumatic Eyes-  Sclera clear, conjunctiva pink Ears- hearing intact Oropharynx- clear Neck- supple, no JVP Lymph- no cervical lymphadenopathy Lungs- Clear to ausculation bilaterally, normal work of breathing Heart- Regular rate and rhythm, no murmurs, rubs or gallops, PMI not laterally displaced GI- soft, NT, ND, + BS Extremities- no clubbing, cyanosis, or edema  Event montor is reviewed and reveals sinus rhythm with no prolonged bradycardia or sustained arrhythmias.  Assessment and Plan:  1. Chronic systolic dysfunction 7/14 echo is reviewed and reveals EF 45-50% Continue current medical therapy  2. afib Maintaining sinus rhythm off of AAD therapy Continue coumadin (Goal INR 2-3).  The importance of close INR follow was discussed today He may want to consider NOAC therapy but will need to discuss this with his [facility] doctors as he has no insurance assistance for such medicines  3. HTN Stable No change required today  4. Thoracic aneurysm Follow closely with Dr Tyrone Sage  Return to see Norma Fredrickson in 3 months I will let Lawson Fiscal determine when he needs to see me again based on his status at that time.  I would like to see him at least once per year with her seeing him also in between

## 2013-08-15 ENCOUNTER — Encounter: Payer: Self-pay | Admitting: Gastroenterology

## 2013-08-15 ENCOUNTER — Ambulatory Visit (INDEPENDENT_AMBULATORY_CARE_PROVIDER_SITE_OTHER): Payer: Medicare Other | Admitting: Gastroenterology

## 2013-08-15 ENCOUNTER — Telehealth: Payer: Self-pay

## 2013-08-15 ENCOUNTER — Other Ambulatory Visit (INDEPENDENT_AMBULATORY_CARE_PROVIDER_SITE_OTHER): Payer: Medicare Other

## 2013-08-15 VITALS — BP 100/60 | HR 64 | Ht 70.0 in | Wt 201.0 lb

## 2013-08-15 DIAGNOSIS — D649 Anemia, unspecified: Secondary | ICD-10-CM

## 2013-08-15 DIAGNOSIS — R195 Other fecal abnormalities: Secondary | ICD-10-CM

## 2013-08-15 DIAGNOSIS — Z7901 Long term (current) use of anticoagulants: Secondary | ICD-10-CM

## 2013-08-15 LAB — VITAMIN B12: Vitamin B-12: 353 pg/mL (ref 211–911)

## 2013-08-15 LAB — CBC WITH DIFFERENTIAL/PLATELET
Basophils Absolute: 0 10*3/uL (ref 0.0–0.1)
Basophils Relative: 0.5 % (ref 0.0–3.0)
Eosinophils Absolute: 0 10*3/uL (ref 0.0–0.7)
HCT: 31.9 % — ABNORMAL LOW (ref 39.0–52.0)
Hemoglobin: 11 g/dL — ABNORMAL LOW (ref 13.0–17.0)
Lymphs Abs: 1.6 10*3/uL (ref 0.7–4.0)
MCHC: 34.4 g/dL (ref 30.0–36.0)
MCV: 93.5 fl (ref 78.0–100.0)
Monocytes Relative: 11.8 % (ref 3.0–12.0)
Neutro Abs: 2.9 10*3/uL (ref 1.4–7.7)
Platelets: 93 10*3/uL — ABNORMAL LOW (ref 150.0–400.0)
RDW: 14.4 % (ref 11.5–14.6)

## 2013-08-15 LAB — FOLATE: Folate: 11.3 ng/mL

## 2013-08-15 MED ORDER — PEG-KCL-NACL-NASULF-NA ASC-C 100 G PO SOLR: 1.0000 | Freq: Once | ORAL | Status: DC

## 2013-08-15 NOTE — Patient Instructions (Signed)
Your physician has requested that you go to the basement for the following lab work before leaving today: CBC, B12, Folate.  You have been scheduled for a colonoscopy with propofol. Please follow written instructions given to you at your visit today.  Please pick up your prep kit at the pharmacy within the next 1-3 days. If you use inhalers (even only as needed), please bring them with you on the day of your procedure.  You will be contaced by our office prior to your procedure for directions on holding your Coumadin/Warfarin.  If you do not hear from our office 1 week prior to your scheduled procedure, please call [phone] to discuss.  Thank you for choosing me and [facility].  Venita Lick. Pleas Koch., MD., Clementeen Graham  cc: Joette Catching, MD

## 2013-08-15 NOTE — Telephone Encounter (Signed)
  08/15/2013   RE: Jimmy Myers DOB: 07-Apr-1932 MRN: [id number]   Dear Dr. Johney Frame,    We have scheduled the above patient for an endoscopic procedure. Our records show that he is on anticoagulation therapy.   Please advise as to how long the patient may come off his therapy of coumadin prior to the procedure, which is scheduled for 08/23/13.  Please fax back/ or route the completed form to [facility] at [phone].   Sincerely,    Christie Nottingham, CMA  Please respond by 08/17/13 and route back to Christie Nottingham, CMA

## 2013-08-15 NOTE — Telephone Encounter (Signed)
Hold coumadin 5 days prior to the procedure and restart immediately afterwards. 

## 2013-08-15 NOTE — Progress Notes (Signed)
History of Present Illness: This is a 79 year old white male accompanied by his wife. He was recently found to have occult positive stool by Dr. Lysbeth Galas. He notes occasional rare episodes of scant amount of blood on the tissue paper. He has mild constipation controlled with MiraLax. CBC showed a white count of 4.2, hemoglobin 10.9 and platelets of 85,000. Iron studies were normal. The patient last underwent colonoscopy in April 2010 for a family history of colon cancer. The colonoscopy was normal. He is also undergoing colonoscopy in 2005 and in 1992. He underwent upper endoscopy by Dr. Lina Sar in April 2013 and this was normal. He is maintained on warfarin. Denies weight loss, abdominal pain, constipation, diarrhea, change in stool caliber, melena, hematochezia, nausea, vomiting, dysphagia, reflux symptoms, chest pain.  Allergies  Allergen Reactions  . Codeine Other (See Comments)    nervousness  . Simvastatin Other (See Comments)    unknown  . Vicodin [Hydrocodone-Acetaminophen] Other (See Comments)    nervousness   Outpatient Prescriptions Prior to Visit  Medication Sig Dispense Refill  . clorazepate (TRANXENE) 3.75 MG tablet Take 3.75 mg by mouth daily as needed. For anxiety      . Ferrous Sulfate (IRON) 325 (65 FE) MG TABS Take 1 tablet by mouth daily.      . furosemide (LASIX) 80 MG tablet Take 0.5 tablets (40 mg total) by mouth daily.  90 tablet  3  . lisinopril (PRINIVIL,ZESTRIL) 5 MG tablet Take 1 tablet (5 mg total) by mouth daily.  90 tablet  3  . metoprolol tartrate (LOPRESSOR) 25 MG tablet Take 1 tablet (25 mg total) by mouth 2 (two) times daily.  60 tablet  11  . nitroGLYCERIN (NITROSTAT) 0.4 MG SL tablet Place 1 tablet (0.4 mg total) under the tongue every 5 (five) minutes x 3 doses as needed for chest pain.  30 tablet  6  . polyethylene glycol (MIRALAX / GLYCOLAX) packet Take 17 g by mouth daily.      Marland Kitchen warfarin (COUMADIN) 5 MG tablet Take as directed by coumadin clinic   120 tablet  1  . omeprazole (PRILOSEC) 20 MG capsule Take 20 mg by mouth 2 (two) times daily.      . potassium chloride SA (K-DUR,KLOR-CON) 20 MEQ tablet Take 2 tablets (40 mEq total) by mouth daily.  60 tablet  0   No facility-administered medications prior to visit.   Past Medical History  Diagnosis Date  . Systolic congestive heart failure with reduced left ventricular function, NYHA class 1     EF is 20 to 25% per echo; felt to be due to reversible tachycardia induced CM  . A-fib May 2013    s/p TEE/cardioversion June 2013  . Anxiety   . Pneumonia May 2013  . HTN (hypertension)   . Thrombocytopenia   . Chronic anticoagulation     on coumadin - checked in [place]  . Aortic root enlargement     at least 5cm by CT  . Bronchitis   . Atrial thrombus   . Bradycardia   . Diverticulosis   . Hemorrhoids     Past Surgical History  Procedure Laterality Date  . Knee arthroscopy  rt knee  . Prostate surgery    . Tee without cardioversion  12/30/2011    Procedure: TRANSESOPHAGEAL ECHOCARDIOGRAM (TEE);  Surgeon: Lewayne Bunting, MD;  Location: [facility] ENDOSCOPY;  Service: Cardiovascular;  Laterality: N/A;  . Cardioversion  12/30/2011    Procedure: CARDIOVERSION;  Surgeon: Arlys John  Ludwig Clarks, MD;  Location: [facility] ENDOSCOPY;  Service: Cardiovascular;  Laterality: N/A;  . Esophagogastroduodenoscopy  12/30/2011    Procedure: ESOPHAGOGASTRODUODENOSCOPY (EGD);  Surgeon: Lewayne Bunting, MD;  Location: [facility] ENDOSCOPY;  Service: Cardiovascular;  Laterality: N/A;  . Esophagogastroduodenoscopy  12/30/2011    Procedure: ESOPHAGOGASTRODUODENOSCOPY (EGD);  Surgeon: Hart Carwin, MD;  Location: [facility] ENDOSCOPY;  Service: Endoscopy;  Laterality: N/A;  . Tee without cardioversion  02/16/2012    Procedure: TRANSESOPHAGEAL ECHOCARDIOGRAM (TEE);  Surgeon: Lewayne Bunting, MD;  Location: [facility] ENDOSCOPY;  Service: Cardiovascular;  Laterality: N/A;  . Cardioversion  02/16/2012    Procedure: CARDIOVERSION;  Surgeon: Lewayne Bunting, MD;  Location: [facility] ENDOSCOPY;  Service: Cardiovascular;  Laterality: N/A;   History   Social History  . Marital Status: Married    Spouse Name: N/A    Number of Children: 2  . Years of Education: N/A   Occupational History  . retired    Social History Main Topics  . Smoking status: Never Smoker   . Smokeless tobacco: Never Used  . Alcohol Use: No  . Drug Use: No  . Sexual Activity: Not Currently   Other Topics Concern  . None   Social History Narrative  . None   Family History  Problem Relation Age of Onset  . Cancer Brother     stomach  . Heart disease Mother   . Heart disease Father   . Heart disease Sister   . Heart disease Brother   . Hypertension Sister     Review of Systems: Pertinent positive and negative review of systems were noted in the above HPI section. All other review of systems were otherwise negative.  General: Elderly white male, no acute distress Eyes: Anicteric sclerae Mouth: No deformity or lesions Neck: Supple, no masses or thyromegaly Lungs: Clear throughout to auscultation Heart: Regular rate and rhythm; no murmurs, rubs or bruits Abdomen: Soft, non tender and non distended. No masses, hepatosplenomegaly or hernias noted. Normal Bowel sounds Rectal: deferred to colonoscopy  Musculoskeletal: Symmetrical with no gross deformities  Skin: No lesions on visible extremities Pulses:  Normal pulses noted Extremities: No clubbing, cyanosis, edema or deformities noted Neurological: Alert oriented x 4, grossly nonfocal Cervical Nodes:  No significant cervical adenopathy Inguinal Nodes: No significant inguinal adenopathy Psychological:  Alert and cooperative. Normal mood and affect  Assessment and Recommendations:  1. Hemoccult positive stool. Family history of colon cancer. Schedule colonoscopy off Coumadin for 5 days after clearance from his prescribing physician. The risks, benefits, and alternatives to colonoscopy with possible biopsy  and possible polypectomy were discussed with the patient and they consent to proceed. Given his age and comorbidities we will probably not plan for any future screening or surveillance colonoscopies.  2. Unspecified anemia and mild thrombocytopenia. Obtain B12 and folate levels. Further evaluation with Dr. Lysbeth Galas. Consider hematology evaluation.  3. Chronic systolic dysfunction, last echo EF 45-50%  4. Atrial fibrillation. Chronic Coumadin anticoagulation. The risks benefits alternatives to a 5 day hold were discussed with the patient he consents to proceed. Clearance from his prescribing physician.

## 2013-08-16 ENCOUNTER — Ambulatory Visit (INDEPENDENT_AMBULATORY_CARE_PROVIDER_SITE_OTHER): Payer: Medicare Other | Admitting: *Deleted

## 2013-08-16 DIAGNOSIS — Z7901 Long term (current) use of anticoagulants: Secondary | ICD-10-CM

## 2013-08-16 DIAGNOSIS — I513 Intracardiac thrombosis, not elsewhere classified: Secondary | ICD-10-CM

## 2013-08-16 DIAGNOSIS — I5189 Other ill-defined heart diseases: Secondary | ICD-10-CM

## 2013-08-16 DIAGNOSIS — I4891 Unspecified atrial fibrillation: Secondary | ICD-10-CM

## 2013-08-16 LAB — POCT INR: INR: 3.3

## 2013-08-16 NOTE — Telephone Encounter (Signed)
Patient notified to hold coumadin 5 days before his procedure per Dr. Johney Frame. Pt verbalized understanding.

## 2013-08-18 ENCOUNTER — Encounter: Payer: Self-pay | Admitting: Gastroenterology

## 2013-08-23 ENCOUNTER — Ambulatory Visit (AMBULATORY_SURGERY_CENTER): Payer: Medicare Other | Admitting: Gastroenterology

## 2013-08-23 ENCOUNTER — Encounter: Payer: Self-pay | Admitting: Gastroenterology

## 2013-08-23 VITALS — BP 117/81 | HR 55 | Temp 96.6°F | Resp 18 | Ht 70.0 in | Wt 201.0 lb

## 2013-08-23 DIAGNOSIS — Z800 Family history of malignant neoplasm of digestive organs: Secondary | ICD-10-CM

## 2013-08-23 DIAGNOSIS — D126 Benign neoplasm of colon, unspecified: Secondary | ICD-10-CM

## 2013-08-23 DIAGNOSIS — R195 Other fecal abnormalities: Secondary | ICD-10-CM

## 2013-08-23 MED ORDER — SODIUM CHLORIDE 0.9 % IV SOLN: 500.0000 mL | INTRAVENOUS | Status: DC

## 2013-08-23 NOTE — Progress Notes (Signed)
Called to room to assist during endoscopic procedure.  Patient ID and intended procedure confirmed with present staff. Received instructions for my participation in the procedure from the performing physician.  

## 2013-08-23 NOTE — Op Note (Signed)
[facility] [address] [place], [address]   COLONOSCOPY PROCEDURE REPORT  PATIENT: Jimmy Myers, Jimmy Myers  MR#: [id number] BIRTHDATE: 08/20/1932 , 79  yrs. old GENDER: Male ENDOSCOPIST: Meryl Dare, MD, [facility] PROCEDURE DATE:  08/23/2013 PROCEDURE:   Colonoscopy with biopsy First Screening Colonoscopy - Avg.  risk and is 50 yrs.  old or older - No.  Prior Negative Screening - Now for repeat screening. N/A  History of Adenoma - Now for follow-up colonoscopy & has been > or = to 3 yrs.  N/A  Polyps Removed Today? Yes. ASA CLASS:   Class III INDICATIONS:heme-positive stool and Patient's immediate family history of colon cancer. MEDICATIONS: MAC sedation, administered by CRNA and propofol (Diprivan) 150mg  IV DESCRIPTION OF PROCEDURE:   After the risks benefits and alternatives of the procedure were thoroughly explained, informed consent was obtained.  A digital rectal exam revealed no abnormalities of the rectum.   The LB [id number]  endoscope was introduced through the anus and advanced to the cecum, which was identified by both the appendix and ileocecal valve. No adverse events experienced.   The quality of the prep was excellent, using MoviPrep  The instrument was then slowly withdrawn as the colon was fully examined.  COLON FINDINGS: Two sessile polyps measuring 3 mm in size were found at the cecum and in the rectum.  A polypectomy was performed with cold forceps.  The resection was complete and the polyp tissue was completely retrieved.   The colon was otherwise normal.  There was no diverticulosis, inflammation, polyps or cancers unless previously stated.  Retroflexed views revealed small internal hemorrhoids. The time to cecum=4 minutes 08 seconds.  Withdrawal time=12 minutes 36 seconds.  The scope was withdrawn and the procedure completed. COMPLICATIONS: There were no complications.  ENDOSCOPIC IMPRESSION: 1.   Two sessile polyps measuring 3 mm at  the cecum and in the rectum; polypectomy performed with cold forceps 2.   Small internal hemorrhoids  RECOMMENDATIONS: 1.  Hold aspirin, aspirin products, and anti-inflammatory medication for 2 weeks. 2.  Await pathology results 3.  Resume Coumadin (warfarin) today and have your PT/INR checked within 1 week. 4.  Given your age, you will not need another colonoscopy for colon cancer screening or polyp surveillance.  These types of tests usually stop around the age 44.  eSigned:  Meryl Dare, MD, [facility] 08/23/2013 10:57 AM   cc: Joette Catching, MD

## 2013-08-23 NOTE — Progress Notes (Signed)
Lidocaine-40mg IV prior to Propofol InductionPropofol given over incremental dosages 

## 2013-08-23 NOTE — Patient Instructions (Signed)
YOU HAD AN ENDOSCOPIC PROCEDURE TODAY AT THE [facility]: Refer to the procedure report that was given to you for any specific questions about what was found during the examination.  If the procedure report does not answer your questions, please call your gastroenterologist to clarify.  If you requested that your care partner not be given the details of your procedure findings, then the procedure report has been included in a sealed envelope for you to review at your convenience later.  YOU SHOULD EXPECT: Some feelings of bloating in the abdomen. Passage of more gas than usual.  Walking can help get rid of the air that was put into your GI tract during the procedure and reduce the bloating. If you had a lower endoscopy (such as a colonoscopy or flexible sigmoidoscopy) you may notice spotting of blood in your stool or on the toilet paper. If you underwent a bowel prep for your procedure, then you may not have a normal bowel movement for a few days.  DIET: Your first meal following the procedure should be a light meal and then it is ok to progress to your normal diet.  A half-sandwich or bowl of soup is an example of a good first meal.  Heavy or fried foods are harder to digest and may make you feel nauseous or bloated.  Likewise meals heavy in dairy and vegetables can cause extra gas to form and this can also increase the bloating.  Drink plenty of fluids but you should avoid alcoholic beverages for 24 hours.  ACTIVITY: Your care partner should take you home directly after the procedure.  You should plan to take it easy, moving slowly for the rest of the day.  You can resume normal activity the day after the procedure however you should NOT DRIVE or use heavy machinery for 24 hours (because of the sedation medicines used during the test).    SYMPTOMS TO REPORT IMMEDIATELY: A gastroenterologist can be reached at any hour.  During normal business hours, 8:30 AM to 5:00 PM Monday through Friday,  call [phone].  After hours and on weekends, please call the GI answering service at [phone] who will take a message and have the physician on call contact you.   Following lower endoscopy (colonoscopy or flexible sigmoidoscopy):  Excessive amounts of blood in the stool  Significant tenderness or worsening of abdominal pains  Swelling of the abdomen that is new, acute  Fever of 100F or higher   FOLLOW UP: If any biopsies were taken you will be contacted by phone or by letter within the next 1-3 weeks.  Call your gastroenterologist if you have not heard about the biopsies in 3 weeks.  Our staff will call the home number listed on your records the next business day following your procedure to check on you and address any questions or concerns that you may have at that time regarding the information given to you following your procedure. This is a courtesy call and so if there is no answer at the home number and we have not heard from you through the emergency physician on call, we will assume that you have returned to your regular daily activities without incident.  SIGNATURES/CONFIDENTIALITY: You and/or your care partner have signed paperwork which will be entered into your electronic medical record.  These signatures attest to the fact that that the information above on your After Visit Summary has been reviewed and is understood.  Full responsibility of the confidentiality of   this discharge information lies with you and/or your care-partner.   Polyp and hemorrhoid information given.  Resume coumadin today and have your PT/INR rechecked within one week.  Hold aspirin, aspirin products and non-steroidal anti-inflammatory medications for 2 weeks.

## 2013-08-24 ENCOUNTER — Telehealth: Payer: Self-pay | Admitting: *Deleted

## 2013-08-24 NOTE — Telephone Encounter (Signed)
  Follow up Call-  Call back number 08/23/2013  Post procedure Call Back phone  # [phone]  Permission to leave phone message Yes     Patient questions:  Do you have a fever, pain , or abdominal swelling? no Pain Score  0 *  Have you tolerated food without any problems? yes  Have you been able to return to your normal activities? yes  Do you have any questions about your discharge instructions: Diet   no Medications  no Follow up visit  no  Do you have questions or concerns about your Care? no  Actions: * If pain score is 4 or above: No action needed, pain <4.  Pt. Was still sleeping,information provided via wife, she stated that he stays up anywhere from 11:00 til 2:00 in the morning and he may not get up before 9 this morning and she stated that if there is any problems later that she would call.

## 2013-08-30 ENCOUNTER — Other Ambulatory Visit: Payer: Self-pay | Admitting: *Deleted

## 2013-08-30 DIAGNOSIS — I7789 Other specified disorders of arteries and arterioles: Secondary | ICD-10-CM

## 2013-09-01 ENCOUNTER — Encounter: Payer: Self-pay | Admitting: Gastroenterology

## 2013-09-22 ENCOUNTER — Ambulatory Visit: Payer: Medicare Other | Admitting: Cardiothoracic Surgery

## 2013-09-29 ENCOUNTER — Ambulatory Visit: Payer: Medicare Other | Admitting: Cardiothoracic Surgery

## 2013-10-03 LAB — BUN: BUN: 23 mg/dL (ref 6–23)

## 2013-10-03 LAB — CREATININE, SERUM: Creat: 0.96 mg/dL (ref 0.50–1.35)

## 2013-10-06 ENCOUNTER — Ambulatory Visit
Admission: RE | Admit: 2013-10-06 | Discharge: 2013-10-06 | Disposition: A | Discharge status: Home / Self Care | Payer: Medicare Other | Source: Ambulatory Visit | Attending: Cardiothoracic Surgery | Admitting: Cardiothoracic Surgery

## 2013-10-06 ENCOUNTER — Ambulatory Visit (INDEPENDENT_AMBULATORY_CARE_PROVIDER_SITE_OTHER): Payer: Medicare Other | Admitting: Cardiothoracic Surgery

## 2013-10-06 ENCOUNTER — Encounter: Payer: Self-pay | Admitting: Cardiothoracic Surgery

## 2013-10-06 VITALS — BP 121/63 | HR 51 | Resp 16 | Ht 70.0 in | Wt 197.0 lb

## 2013-10-06 DIAGNOSIS — I712 Thoracic aortic aneurysm, without rupture, unspecified: Secondary | ICD-10-CM

## 2013-10-06 DIAGNOSIS — I7121 Aneurysm of the ascending aorta, without rupture: Secondary | ICD-10-CM

## 2013-10-06 DIAGNOSIS — I7789 Other specified disorders of arteries and arterioles: Secondary | ICD-10-CM

## 2013-10-06 MED ORDER — IOHEXOL 350 MG/ML SOLN
100.0000 mL | Freq: Once | INTRAVENOUS | Status: AC | PRN
  Administered 2013-10-06: 100 mL via INTRAVENOUS

## 2013-10-06 NOTE — Progress Notes (Signed)
Jimmy Myers [address]       [place], [address]             [phone]                                                                Jimmy Myers [place] Medical Record [id number] Date of Birth: 1932-07-05  Referring: Jimmy Grayer, MD Primary Care: Jimmy Mustache, MD  Chief Complaint:    Chief Complaint  Patient presents with  . Thoracic Aortic Aneurysm    6 month f/u with CTA Chest    History of Present Illness:    Patient is a 80 year old male who presented to cardiology in April of 2013 with increasing congestive heart failure, chronically elevated heart rate with atrial fibrillation in the 120s to 130s and left atrial clot. Since that time he's been started on Coumadin treated for heart failure, had a cardioversion and is maintaining sinus rhythm. His echo evidence of LV function has markedly improved. In his evaluation he was incidentally noted to have a dilated ascending aorta. He comes to the office today with follow up cta of the chest surgical evaluation. He has not had a recent cardiac catheterization.   The patient notes that over the past several months he is still better, with less shortness of breath. He denies chest discomfort.  Current Activity/ Functional Status: Patient is independent with mobility/ambulation, transfers, ADL's, IADL's.   Past Medical History  Diagnosis Date  . Systolic congestive heart failure with reduced left ventricular function, NYHA class 1     EF is 20 to 25% per echo; felt to be due to reversible tachycardia induced CM  . A-fib May 2013    s/p TEE/cardioversion June 2013  . Anxiety   . Pneumonia May 2013  . HTN (hypertension)   . Thrombocytopenia   . Chronic anticoagulation     on coumadin - checked in [place]  . Aortic root enlargement     at least 5cm by CT  . Bronchitis   . Atrial thrombus   . Bradycardia   . Diverticulosis   . Hemorrhoids     Past Surgical History  Procedure Laterality Date    . Knee arthroscopy  rt knee  . Prostate surgery    . Tee without cardioversion  12/30/2011    Procedure: TRANSESOPHAGEAL ECHOCARDIOGRAM (TEE);  Surgeon: Jimmy Perla, MD;  Location: [facility] ENDOSCOPY;  Service: Cardiovascular;  Laterality: N/A;  . Cardioversion  12/30/2011    Procedure: CARDIOVERSION;  Surgeon: Jimmy Perla, MD;  Location: [facility] ENDOSCOPY;  Service: Cardiovascular;  Laterality: N/A;  . Esophagogastroduodenoscopy  12/30/2011    Procedure: ESOPHAGOGASTRODUODENOSCOPY (EGD);  Surgeon: Jimmy Perla, MD;  Location: [facility] ENDOSCOPY;  Service: Cardiovascular;  Laterality: N/A;  . Esophagogastroduodenoscopy  12/30/2011    Procedure: ESOPHAGOGASTRODUODENOSCOPY (EGD);  Surgeon: Jimmy Dragon, MD;  Location: [facility] ENDOSCOPY;  Service: Endoscopy;  Laterality: N/A;  . Tee without cardioversion  02/16/2012    Procedure: TRANSESOPHAGEAL ECHOCARDIOGRAM (TEE);  Surgeon: Jimmy Perla, MD;  Location: [facility] ENDOSCOPY;  Service: Cardiovascular;  Laterality: N/A;  . Cardioversion  02/16/2012    Procedure: CARDIOVERSION;  Surgeon: Jimmy Perla, MD;  Location: [facility];  Service: Cardiovascular;  Laterality: N/A;  Family History  Problem Relation Age of Onset  . Cancer Brother     stomach  . Heart disease Mother 84  . Heart disease Father   . Heart disease Sister   . Heart disease Brother 87 died mi, and 75 died of mi  . Hypertension Sister       History  Smoking status  . Never Smoker   Smokeless tobacco  . Never Used    History  Alcohol Use No     Allergies  Allergen Reactions  . Codeine Other (See Comments)    nervousness  . Simvastatin Other (See Comments)    unknown  . Vicodin [Hydrocodone-Acetaminophen] Other (See Comments)    nervousness    Current Outpatient Prescriptions  Medication Sig Dispense Refill  . clorazepate (TRANXENE) 3.75 MG tablet Take 3.75 mg by mouth daily as needed. For anxiety      . furosemide (LASIX) 80 MG tablet Take 0.5 tablets (40 mg  total) by mouth daily.  90 tablet  3  . lisinopril (PRINIVIL,ZESTRIL) 5 MG tablet Take 1 tablet (5 mg total) by mouth daily.  90 tablet  3  . metoprolol tartrate (LOPRESSOR) 25 MG tablet Take 1 tablet (25 mg total) by mouth 2 (two) times daily.  60 tablet  11  . nitroGLYCERIN (NITROSTAT) 0.4 MG SL tablet Place 1 tablet (0.4 mg total) under the tongue every 5 (five) minutes x 3 doses as needed for chest pain.  30 tablet  6  . polyethylene glycol (MIRALAX / GLYCOLAX) packet Take 17 g by mouth daily.      . potassium chloride SA (K-DUR,KLOR-CON) 20 MEQ tablet Take 2 tablets (40 mEq total) by mouth daily.  60 tablet  0  . warfarin (COUMADIN) 5 MG tablet Take as directed by coumadin clinic  120 tablet  1   No current facility-administered medications for this visit.       Review of Systems:     Cardiac Review of Systems: Y or N  Chest Pain [ n   ]  Resting SOB [  n ] Exertional SOB  [better  ]  Orthopnea [ better ]   Pedal Edema [ y  ]    Palpitations Blue.Reese  ] Syncope  Florencio.Farrier  ]   Presyncope [ n  ]  General Review of Systems: [Y] = yes [  ]=no Constitional: recent weight change Blue.Reese  ]; anorexia [  ]; fatigue [  ]; nausea [  ]; night sweats [ n ]; fever [ n ]; or chills [ n ];                                                                                                                                          Dental: poor dentition[y  ]; Last Dentist visit:couple years  Eye : blurred vision [  ]; diplopia [   ]; vision  changes [  ];  Amaurosis fugax[  ]; Resp: cough [  ];  wheezing[ n ];  hemoptysis[  ]; shortness of breath[  ]; paroxysmal nocturnal dyspnea[  ]; dyspnea on exertion[  ]; or orthopnea[  ];  GI:  gallstones[  ], vomiting[  ];  dysphagia[  ]; melena[  ];  hematochezia [  ]; heartburn[  ];   Hx of  Colonoscopy[  ]; GU: kidney stones [  ]; hematuria[  ];   dysuria [  ];  nocturia[  ];  history of     obstruction [  ];             Skin: rash, swelling[  ];, hair loss[  ];  peripheral edema[   ];  or itching[  ]; Musculosketetal: myalgias[  ];  joint swelling[  ];  joint erythema[  ];  joint pain[  ];  back pain[  ];  Heme/Lymph: bruising[  ];  bleeding[  ];  anemia[  ];  Neuro: TIA[  ];  headaches[  ];  stroke[  ];  vertigo[  ];  seizures[  ];   paresthesias[  ];  difficulty walking[  ];  Psych:depression[  ]; anxiety[  ];  Endocrine: diabetes[  ];  thyroid dysfunction[  ];  Immunizations: Flu Milo.Brash  ]; Pneumococcal[  n];  Other:  Physical Exam: BP 121/63  Pulse 51  Resp 16  Ht 5\' 10"  (1.778 m)  Wt 197 lb (89.359 kg)  BMI 28.27 kg/m2  SpO2 98% Overall the patient looks better than on his previous visit to the office General appearance: alert, cooperative and no distress Neurologic: intact Heart: regular rate and rhythm, S1, S2 normal, no murmur, Myers, rub or gallop and normal apical impulse Lungs: clear to auscultation bilaterally and normal percussion bilaterally Abdomen: soft, non-tender; bowel sounds normal; no masses,  no organomegaly Extremities: extremities normal, atraumatic, no cyanosis or edema and Homans sign is negative, no sign of DVT Patient has no carotid bruits, has no cervical supraclavicular adenopathy, has full DP PT radial brachial and femoral pulses Patient appears to be in sinus rhythm on exam I do not appreciate any murmur of aortic insufficiency He has a known thyroid mass 5 cm in size but I cannot appreciate this on physical exam   Diagnostic Studies & Laboratory data:     Recent Radiology Findings:  Ct Angio Chest Aorta W/cm &/or Wo/cm  10/06/2013   CLINICAL DATA:  Follow up aortic root enlargement. No previous relevant surgery.  EXAM: CT ANGIOGRAPHY CHEST WITH CONTRAST  TECHNIQUE: Multidetector CT imaging of the chest was performed using the standard protocol during bolus administration of intravenous contrast. Multiplanar CT image reconstructions including MIPs were obtained to evaluate the vascular anatomy.  CONTRAST:  OMNIPAQUE IOHEXOL 350  MG/ML SOLN  COMPARISON:  CT ANGIO CHEST W/CM &/OR WO/CM dated 01/13/2012; DG CHEST 2 VIEW dated 01/06/2012  FINDINGS: The aortic lumen is well opacified with contrast. Again demonstrated is dilatation of the aortic root. The aortic lumen measures at least 5.5 cm, not significantly changed from the prior study as remeasured the aortic valve appears tricuspid. The aortic arch and descending aorta appear stable with mild atherosclerosis, but no focal aneurysm, penetrating ulcer or dissection.  Coronary artery and great vessel atherosclerosis is noted. There is stable cardiomegaly. The pulmonary arteries appear normal.  There are no enlarged mediastinal or hilar lymph nodes. There is no pleural or pericardial effusion. Dominant 5.6 x 3.8 cm left thyroid nodule  is noted with stable tracheal deviation to the right. Smaller right thyroid nodule appears unchanged.  The lungs are clear aside from mild dependent atelectasis at both bases.  Images through the upper abdomen demonstrate calcified gallstones and a right renal cyst. There is atherosclerosis of the aorta and visceral arteries with stenosis of the celiac trunk at its origin, suspicious for median arcuate ligament syndrome.  Review of the MIP images confirms the above findings.  IMPRESSION: 1. Stable dilatation of the aortic root. Patient has had previous echocardiography demonstrating mild aortic regurgitation. 2. No dissection or penetrating ulcer identified. 3. No pleural or pericardial effusion. 4. Stable dominant left thyroid nodule. 5. 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 6. Cholelithiasis.   Electronically Signed   By: Jimmy Myers M.D.   On: 10/06/2013 15:00    Ct Angio Chest Aorta W/cm &/or Wo/cm  03/24/2013   *RADIOLOGY REPORT*  Clinical Data: Known thoracic aortic aneurysm  CT ANGIOGRAPHY CHEST  Technique:  Multidetector CT imaging of the chest using the standard protocol during bolus administration of  intravenous contrast. Multiplanar reconstructed images including MIPs were obtained and reviewed to evaluate the vascular anatomy.  Contrast: 74mL OMNIPAQUE IOHEXOL 350 MG/ML SOLN  Comparison: 01/13/2012  Findings: The lungs are well-aerated bilaterally.  Some minimal scarring is noted in the left lung base stable from prior exam.  No sizable parenchymal nodule is seen.  No focal infiltrate is noted.  There are changes in the thyroid gland with a large left thyroid nodule which is increased in the interval from prior exam now measuring almost 5 cm.  It previously measured 3.8 cm.  Small right- sided thyroid nodules are noted as well.  There is prominence of the ascending aorta again identified.  When measured at a similar level to that on the prior exam the aorta and now measures 4.6 x 4.2 cm in greatest transverse and AP dimensions respectively.  It previously measured 4.2 x 4.0 cm in similar dimensions.  At the level of the coronary arteries the aortic root measures 5.8 cm this is best visualized on the coronal imaging. It previously measured 5.1 cm in this region.  The aorta tapers in a normal fashion after the origins of the great vessels.  A tricuspid aortic valve is noted.  Heavy coronary calcifications are noted in the left main coronary artery left anterior descending coronary artery.  Lesser calcifications are noted in the right coronary artery.  No significant hilar or mediastinal adenopathy is noted.  The visualized portions of the pulmonary artery are within normal limits.  Scanning into the upper abdomen reveals a right renal cyst which is stable from prior exam.  A few small dependent gallstones are now visualized.  Bony structures are within normal limits.  IMPRESSION: Dilatation of the aortic root and ascending aorta which has increased in the interval from prior exam as described above.  Enlarging thyroid nodules particularly on the left.   Original Report Authenticated By: Jimmy Myers, M.D.       Ct Angio Chest Aortic Dissect W &/or W/o  08/16/2012  *RADIOLOGY REPORT*  Clinical Data: Enlarged aorta  CT ANGIOGRAPHY CHEST  Technique:  Multidetector CT imaging of the chest using the standard protocol during bolus administration of intravenous contrast. Multiplanar reconstructed images including MIPs were obtained and reviewed to evaluate the vascular anatomy.  Contrast: 172mL OMNIPAQUE IOHEXOL 350 MG/ML SOLN  Comparison: 01/13/2012  Findings: Dilatation of the aortic root is again noted.  Diameters of the aorta at the sinus of Valsalva, sinotubular junction, and in the ascending aorta are 5.2, 4.5, and 4.6 cm respectively. Previously, the aortic root was 5.1 cm in diameter. Diameters at the aortic arch and descending aorta are 3.1 and 3.6 cm.  The innominate artery is tortuous and ectatic with maximal diameter of 1.6 cm.  Right subclavian and common carotid arteries are patent.  Mild calcified atherosclerotic change at the origin of the right vertebral artery.  Left common carotid and subclavian arteries are patent.  Atherosclerotic changes at the origin of the left vertebral artery are present making degree of narrowing at its origin difficult.  Coronary artery opacification is difficult due to motion artifact. Atherosclerotic changes at the right coronary artery branches, left main coronary artery, left anterior descending coronary artery are present.  Cardiomegaly is present.  The heart is dilated.  The left ventricle, in particular is significantly dilated without significant calcification of the aortic valve suggesting aortic valve insufficiency. Left atrium is not particularly dilated.  No obvious filling defects in the pulmonary arterial tree to suggest acute pulmonary thromboembolism.  The left lobe of the thyroid mass has further enlarged measuring 5.3 x 3.3  centimeters on image 9.  Negative abnormal mediastinal adenopathy.  Negative pericardial effusion.  No evidence of pulmonary parenchymal mass  or consolidation.  No pneumothorax.  No pleural effusion  Calcified central disc herniation neck T9-10 causes an element of spinal stenosis.  Diffuse hepatic steatosis.  Tiny hypodensity in the left lobe of the liver is stable.  Stable benign appearing right renal cyst.  Small gallstone.  Atherosclerotic changes at the origin of the SMA without significant narrowing.  Severe narrowing in the proximal celiac.  IMPRESSION: Stable dilatation of the aortic root.  Diameter at the sinus of Valsalva is 5.2 cm.  There is dilatation of the left ventricle without significant valvular calcifications suggesting aortic valve regurgitation.  Left thyroid mass has further enlarged.  Correlation with sonography is recommended.  Cholelithiasis.  Stable incidental findings.   Original Report Authenticated By: Jimmy Myers, M.D.    Recent Lab Findings: Lab Results  Component Value Date   WBC 5.2 08/15/2013   HGB 11.0* 08/15/2013   HCT 31.9* 08/15/2013   PLT 93.0* 08/15/2013   GLUCOSE 100* 03/01/2013   CHOL 85 12/29/2011   TRIG 30 12/29/2011   HDL 40 12/29/2011   LDLCALC 39 12/29/2011   ALT 14 03/01/2013   AST 19 03/01/2013   NA 138 03/01/2013   K 4.3 03/01/2013   CL 103 03/01/2013   CREATININE 0.96 10/03/2013   BUN 23 10/03/2013   CO2 28 03/01/2013   TSH 0.498 12/28/2011   INR 3.3 08/16/2013   Aortic Size Index=      5.2   /Body surface area is 2.10 meters squared. =2.45  < 2.75 cm/m2      4% risk per year 2.75 to 4.25          8% risk per year > 4.25 cm/m2    20% risk per year   Assessment / Plan:   Aortic root enlargement  With out AI, at the sinus  5.2-5.5cm mid ascending aorta 4.5 cm, with some enlargement at root. History of Atrial fibrillation  Maintaining sinus rhythm  Continues  coumadin Chronic systolic heart failure hypertension  Multiple thyroid nodules unchanged Since last seen the patient seems to be walking slower, more for wide stance gait and needs his wife to keep his thoughts straight.  I have  reviewed with the patient and his wife the current scan risks and options involved with elective replacement of his aortic root at age 30. The rest of her rupture and/or dissection have been discussed with them. Return 6 months with follow up [place] [phone] Office [phone] 10/06/2013 4:45 PM

## 2013-10-07 ENCOUNTER — Telehealth: Payer: Self-pay | Admitting: Internal Medicine

## 2013-10-07 NOTE — Telephone Encounter (Signed)
HAD  LONG  DISCUSSION  WITH  PT , NOTES 5 LB   WEIGHT  GAIN  OVER  PERIOD  OF  WEEKS   DOES NOTE  ON O[facility]   SWELLING TO  RIGHT  FOOT  INFORMED  PT  THAT  OVER  SUCH LONG PERIOD   5  LB'S  IS  OKAY,  CONTINUE TO WEIGH DAILY   BUT  IF HAS  2-3 LB WEIGHT  GAIN IN 24 HOURS TO CALL OR  IF  HAS  SIG  EDEMA   OR  SOB  TO   CALL .ALSO  NO SIG PROBLEMS  WITH  URINATING  OR HAVING  BM RECENTLY HAD COLONSOCPY WILL  FORWARD TO DR [name]  FOR  REVIEW .Adonis Housekeeper

## 2013-10-07 NOTE — Telephone Encounter (Signed)
New message    C/O gain about 5 lbs since thanksgiving. Belly is large.  Ask patient is he having sob, stated not that much.  Took a fluid pill this am.

## 2013-10-09 NOTE — Telephone Encounter (Signed)
Please offer a sooner appointment with Truitt Merle

## 2013-10-10 NOTE — Telephone Encounter (Signed)
Left message on machine for pt to contact the office.   

## 2013-10-10 NOTE — Telephone Encounter (Signed)
Please see if he would like a sooner visit with me.

## 2013-10-10 NOTE — Telephone Encounter (Signed)
S/w pt does not want to move up appointment stated we will keep pt's appointment as is. Stated to call us if you need [place]

## 2013-10-13 ENCOUNTER — Encounter: Payer: Self-pay | Admitting: Gastroenterology

## 2013-11-08 ENCOUNTER — Ambulatory Visit (INDEPENDENT_AMBULATORY_CARE_PROVIDER_SITE_OTHER): Payer: Medicare Other | Admitting: Nurse Practitioner

## 2013-11-08 ENCOUNTER — Encounter: Payer: Self-pay | Admitting: Nurse Practitioner

## 2013-11-08 ENCOUNTER — Encounter (INDEPENDENT_AMBULATORY_CARE_PROVIDER_SITE_OTHER): Payer: Self-pay

## 2013-11-08 VITALS — BP 100/70 | HR 60 | Ht 70.0 in | Wt 204.4 lb

## 2013-11-08 DIAGNOSIS — R001 Bradycardia, unspecified: Secondary | ICD-10-CM

## 2013-11-08 DIAGNOSIS — I498 Other specified cardiac arrhythmias: Secondary | ICD-10-CM

## 2013-11-08 DIAGNOSIS — I4891 Unspecified atrial fibrillation: Secondary | ICD-10-CM

## 2013-11-08 DIAGNOSIS — I5022 Chronic systolic (congestive) heart failure: Secondary | ICD-10-CM

## 2013-11-08 DIAGNOSIS — Z79899 Other long term (current) drug therapy: Secondary | ICD-10-CM

## 2013-11-08 LAB — BASIC METABOLIC PANEL
BUN: 28 mg/dL — ABNORMAL HIGH (ref 6–23)
CO2: 28 mEq/L (ref 19–32)
Calcium: 9.2 mg/dL (ref 8.4–10.5)
Chloride: 103 mEq/L (ref 96–112)
Creatinine, Ser: 1.2 mg/dL (ref 0.4–1.5)
GFR: 61.98 mL/min (ref >60.00)
Glucose, Bld: 73 mg/dL (ref 70–99)
Potassium: 4.3 mEq/L (ref 3.5–5.1)
Sodium: 139 mEq/L (ref 135–145)

## 2013-11-08 LAB — CBC
HCT: 30.3 % — ABNORMAL LOW (ref 39.0–52.0)
Hemoglobin: 10.4 g/dL — ABNORMAL LOW (ref 13.0–17.0)
MCHC: 34.5 g/dL (ref 30.0–36.0)
MCV: 95.8 fl (ref 78.0–100.0)
Platelets: 95 10*3/uL — ABNORMAL LOW (ref 150.0–400.0)
RBC: 3.16 Mil/uL — ABNORMAL LOW (ref 4.22–5.81)
RDW: 14 % (ref 11.5–14.6)
WBC: 5.2 10*3/uL (ref 4.5–10.5)

## 2013-11-08 NOTE — Progress Notes (Signed)
Jimmy Myers Date of Birth: [birth year] Medical Record #[id number]  History of Present Illness: Jimmy Myers is seen back today for a 3 month check. Seen for Dr. [name]. He has systolic heart failure with an EF down to 20 to 25% with improvement after medical therapy - up to 50% per echo in August of 2013 and was 45 to 50% by echo in July of 2014. His other issues include atrial fib with prior cardioversion, bradycardia, enlarged ascending aorta (followed by Dr. [name]), thyroid nodules (with benign biopsy) and HTN.   Last seen by me was in July of 2014. Has seen Richardson Dopp, PA back in November for fatigue - felt to be from bradycardia - event monitor was obtained - patient was hesitant to cut back his BB. Saw Dr. [name] back in December and seemed to be doing ok. Looks like his monitor showed an average HR of 61.   Comes back today. Here with his wife. Doing ok. Still has some times where he gets tired - not wanting to cut any medicines back - likes to keep his heart rate low. No palpitations. Not short of breath. No swelling. No cough. Weight stable. Not as active due to the cold weather we have had. Tolerating his medicines.   Current Outpatient Prescriptions  Medication Sig Dispense Refill  . clorazepate (TRANXENE) 3.75 MG tablet Take 3.75 mg by mouth daily as needed. For anxiety      . furosemide (LASIX) 80 MG tablet Take 0.5 tablets (40 mg total) by mouth daily.  90 tablet  3  . lisinopril (PRINIVIL,ZESTRIL) 5 MG tablet Take 1 tablet (5 mg total) by mouth daily.  90 tablet  3  . metoprolol tartrate (LOPRESSOR) 25 MG tablet Take 1 tablet (25 mg total) by mouth 2 (two) times daily.  60 tablet  11  . nitroGLYCERIN (NITROSTAT) 0.4 MG SL tablet Place 1 tablet (0.4 mg total) under the tongue every 5 (five) minutes x 3 doses as needed for chest pain.  30 tablet  6  . omeprazole (PRILOSEC) 20 MG capsule Take 20 mg by mouth daily.      . polyethylene glycol (MIRALAX / GLYCOLAX) packet Take 17 g by mouth  daily.      . potassium chloride SA (K-DUR,KLOR-CON) 20 MEQ tablet Take 2 tablets (40 mEq total) by mouth daily.  60 tablet  0  . warfarin (COUMADIN) 5 MG tablet Take as directed by coumadin clinic  120 tablet  1   No current facility-administered medications for this visit.    Allergies  Allergen Reactions  . Codeine Other (See Comments)    nervousness  . Simvastatin Other (See Comments)    unknown  . Vicodin [Hydrocodone-Acetaminophen] Other (See Comments)    nervousness    Past Medical History  Diagnosis Date  . Systolic congestive heart failure with reduced left ventricular function, NYHA class 1     EF is 20 to 25% per echo; felt to be due to reversible tachycardia induced CM  . A-fib May 2013    s/p TEE/cardioversion June 2013  . Anxiety   . Pneumonia May 2013  . HTN (hypertension)   . Thrombocytopenia   . Chronic anticoagulation     on coumadin - checked in [place]  . Aortic root enlargement     at least 5cm by CT  . Bronchitis   . Atrial thrombus   . Bradycardia   . Diverticulosis   . Hemorrhoids     Past Surgical  History  Procedure Laterality Date  . Knee arthroscopy  rt knee  . Prostate surgery    . Tee without cardioversion  12/30/2011    Procedure: TRANSESOPHAGEAL ECHOCARDIOGRAM (TEE);  Surgeon: Lelon Perla, MD;  Location[facility] ENDOSCOPY;  Service: Cardiovascular;  Laterality: N/A;  . Cardioversion  12/30/2011    Procedure: CARDIOVERSION;  Surgeon: Lelon Perla, MD;  Location: [facility] ENDOSCOPY;  Service: Cardiovascular;  Laterality: N/A;  . Esophagogastroduodenoscopy  12/30/2011    Procedure: ESOPHAGOGASTRODUODENOSCOPY (EGD);  Surgeon: Lelon Perla, MD;  Location: [facility] ENDOSCOPY;  Service: Cardiovascular;  Laterality: N/A;  . Esophagogastroduodenoscopy  12/30/2011    Procedure: ESOPHAGOGASTRODUODENOSCOPY (EGD);  Surgeon: Lafayette Dragon, MD;  Location: [facility] ENDOSCOPY;  Service: Endoscopy;  Laterality: N/A;  . Tee without cardioversion  02/16/2012    Procedure:  TRANSESOPHAGEAL ECHOCARDIOGRAM (TEE);  Surgeon: Lelon Perla, MD;  Location: [facility] ENDOSCOPY;  Service: Cardiovascular;  Laterality: N/A;  . Cardioversion  02/16/2012    Procedure: CARDIOVERSION;  Surgeon: Lelon Perla, MD;  Location: [facility] ENDOSCOPY;  Service: Cardiovascular;  Laterality: N/A;    History  Smoking status  . Never Smoker   Smokeless tobacco  . Never Used    History  Alcohol Use No    Family History  Problem Relation Age of Onset  . Cancer Brother     stomach  . Heart disease Mother   . Heart disease Father   . Heart disease Sister   . Heart disease Brother   . Hypertension Sister     Review of Systems: The review of systems is per the HPI.  All other systems were reviewed and are negative.  Physical Exam: BP 100/70  Pulse 60  Ht 5\' 10"  (1.778 m)  Wt 204 lb 6.4 oz (92.715 kg)  BMI 29.33 kg/m2  SpO2 97% Patient is alert and in no acute distress. Little ornery in his disposition - this is chronic.  Skin is warm and dry. Color is normal.  HEENT is unremarkable. Normocephalic/atraumatic. PERRL. Sclera are nonicteric. Neck is supple. No masses. No JVD. Lungs are clear. Cardiac exam shows a regular rate and rhythm. Abdomen is soft. Extremities are without edema. Gait and ROM are intact. No gross neurologic deficits noted.  Wt Readings from Last 3 Encounters:  11/08/13 204 lb 6.4 oz (92.715 kg)  10/06/13 197 lb (89.359 kg)  08/23/13 201 lb (91.173 kg)    LABORATORY DATA: PENDING  Lab from the [facility] show a normal TSH   Lab Results  Component Value Date   WBC 5.2 08/15/2013   HGB 11.0* 08/15/2013   HCT 31.9* 08/15/2013   PLT 93.0* 08/15/2013   GLUCOSE 100* 03/01/2013   CHOL 85 12/29/2011   TRIG 30 12/29/2011   HDL 40 12/29/2011   LDLCALC 39 12/29/2011   ALT 14 03/01/2013   AST 19 03/01/2013   NA 138 03/01/2013   K 4.3 03/01/2013   CL 103 03/01/2013   CREATININE 0.96 10/03/2013   BUN 23 10/03/2013   CO2 28 03/01/2013   TSH 0.498 12/28/2011   INR 3.3 08/16/2013     Echo Study Conclusions from July 2014  - Left ventricle: The cavity size was mildly dilated. Wall thickness was normal. Systolic function was mildly reduced. The estimated ejection fraction was in the range of 45% to 50%. Diffuse hypokinesis. Doppler parameters are consistent with abnormal left ventricular relaxation (grade 1 diastolic dysfunction). - Aortic valve: Mild regurgitation. - Aorta: Fusiform dilatation of the ascending aorta from the sinuses  to the root The aorta was severely dilated. - Mitral valve: Mild regurgitation. - Left atrium: The atrium was mildly dilated.   Assessment / Plan:  1. PAF - in sinus by exam today. Remains bradycardic at times but is totally asymptomatic. His fatigue is a chronic complaint - he has had a satisfactory event monitor. He is not interested in cutting medicines back and at this time, I do not think we need to do that.   2. Chronic coumadin therapy - checked by PCP - no problems noted.  3. Systolic heart failure - last EF in July of 2014 was 45 to 50% - looks very well compensated - continue with current regimen  4. Enlarged aorta - followed by Dr. [name] - sees him back in August  I will see him back in 6 months. Check baseline labs today. Overall he is felt to be stable.    Patient is agreeable to this plan and will call if any problems develop in the interim.  Burtis Junes, RN, ANP-C  [facility]  [address] [place]  [place], [place] [address]

## 2013-11-08 NOTE — Patient Instructions (Signed)
I think you are doing well from our standpoint  Try to be more active when the weather gets better  I will check labs today  See me in 6 months  Call the [place] office at [phone] if you have any questions, problems or concerns.

## 2013-11-09 ENCOUNTER — Other Ambulatory Visit: Payer: Self-pay | Admitting: *Deleted

## 2013-11-09 DIAGNOSIS — D649 Anemia, unspecified: Secondary | ICD-10-CM

## 2013-11-29 ENCOUNTER — Telehealth: Payer: Self-pay | Admitting: Internal Medicine

## 2013-11-29 NOTE — Telephone Encounter (Signed)
New message      Pt need to have teeth extracted on 12-20-13.  Need clearance and instructions on how to handle coumadin.  Fax clearance to [phone]

## 2013-12-01 NOTE — Telephone Encounter (Signed)
Per Dr [name], proceed if medically indicated  Hold Coumadin 5 days prior to procedure and resume immediately after.   Will have [facility] fax to Dr Wynetta Emery

## 2013-12-07 ENCOUNTER — Other Ambulatory Visit: Payer: Medicare Other

## 2013-12-12 ENCOUNTER — Telehealth: Payer: Self-pay | Admitting: Nurse Practitioner

## 2013-12-12 ENCOUNTER — Telehealth: Payer: Self-pay | Admitting: Internal Medicine

## 2013-12-12 NOTE — Telephone Encounter (Signed)
Iron pills are ok to take - would clarify how much with [facility] and/or his PCP.

## 2013-12-12 NOTE — Telephone Encounter (Signed)
New message      Need clearance for tooth extraction---pt is on coumadin

## 2013-12-12 NOTE — Telephone Encounter (Signed)
New message      Pt saw his doctor at the [facility] in [place].  They said take 3 iron pills a day because his blood is low and other issues.  The directions on the bottle said take 1 iron pill a day but they said take 3 a day.  Can he take 3 iron pills a day with his heart medication?

## 2013-12-12 NOTE — Telephone Encounter (Signed)
Will refax  Faxed on 4/2

## 2013-12-13 NOTE — Telephone Encounter (Signed)
S/w pt stated per Cecille Rubin OK to take 3 iron pills both wife and pt clarified what [facility] stated to [facility]

## 2014-01-12 ENCOUNTER — Other Ambulatory Visit: Payer: Self-pay | Admitting: Internal Medicine

## 2014-01-19 ENCOUNTER — Other Ambulatory Visit: Payer: Self-pay

## 2014-01-19 MED ORDER — LISINOPRIL 5 MG PO TABS: ORAL_TABLET | ORAL | Status: DC

## 2014-02-14 ENCOUNTER — Other Ambulatory Visit: Payer: Self-pay | Admitting: *Deleted

## 2014-02-14 MED ORDER — METOPROLOL TARTRATE 25 MG PO TABS: 25.0000 mg | ORAL_TABLET | Freq: Two times a day (BID) | ORAL | Status: DC

## 2014-03-20 ENCOUNTER — Other Ambulatory Visit: Payer: Self-pay | Admitting: *Deleted

## 2014-03-20 DIAGNOSIS — I712 Thoracic aortic aneurysm, without rupture, unspecified: Secondary | ICD-10-CM

## 2014-04-12 ENCOUNTER — Other Ambulatory Visit: Payer: Self-pay

## 2014-04-12 MED ORDER — FUROSEMIDE 80 MG PO TABS: 40.0000 mg | ORAL_TABLET | Freq: Every day | ORAL | Status: DC

## 2014-04-20 ENCOUNTER — Other Ambulatory Visit: Payer: Medicare Other

## 2014-04-20 ENCOUNTER — Ambulatory Visit: Payer: Medicare Other | Admitting: Cardiothoracic Surgery

## 2014-04-25 ENCOUNTER — Other Ambulatory Visit: Payer: Self-pay | Admitting: *Deleted

## 2014-04-25 DIAGNOSIS — I712 Thoracic aortic aneurysm, without rupture, unspecified: Secondary | ICD-10-CM

## 2014-04-26 ENCOUNTER — Other Ambulatory Visit: Payer: Self-pay | Admitting: *Deleted

## 2014-04-26 ENCOUNTER — Encounter: Payer: Self-pay | Admitting: Cardiothoracic Surgery

## 2014-04-26 ENCOUNTER — Ambulatory Visit (INDEPENDENT_AMBULATORY_CARE_PROVIDER_SITE_OTHER): Payer: Medicare Other | Admitting: Cardiothoracic Surgery

## 2014-04-26 ENCOUNTER — Ambulatory Visit
Admission: RE | Admit: 2014-04-26 | Discharge: 2014-04-26 | Disposition: A | Discharge status: Home / Self Care | Payer: Medicare Other | Source: Ambulatory Visit | Attending: Cardiothoracic Surgery | Admitting: Cardiothoracic Surgery

## 2014-04-26 VITALS — BP 110/58 | HR 60 | Resp 16 | Ht 70.0 in | Wt 195.0 lb

## 2014-04-26 DIAGNOSIS — I7789 Other specified disorders of arteries and arterioles: Secondary | ICD-10-CM

## 2014-04-26 DIAGNOSIS — I7121 Aneurysm of the ascending aorta, without rupture: Secondary | ICD-10-CM

## 2014-04-26 DIAGNOSIS — I712 Thoracic aortic aneurysm, without rupture, unspecified: Secondary | ICD-10-CM

## 2014-04-26 DIAGNOSIS — I77810 Thoracic aortic ectasia: Secondary | ICD-10-CM

## 2014-04-26 LAB — BUN: BUN: 24 mg/dL — ABNORMAL HIGH (ref 6–23)

## 2014-04-26 LAB — CREATININE, SERUM: Creat: 0.9 mg/dL (ref 0.50–1.35)

## 2014-04-26 MED ORDER — IOHEXOL 350 MG/ML SOLN
80.0000 mL | Freq: Once | INTRAVENOUS | Status: AC | PRN
  Administered 2014-04-26: 80 mL via INTRAVENOUS

## 2014-04-26 NOTE — Progress Notes (Signed)
[address]       [place] [address]             [phone]                                                                Gagandeep A Holzmann [place] Medical Record #[id number] Date of Birth: 1932/05/02  Referring: Thompson Grayer, MD Primary Care: [name], MD  Chief Complaint:    Chief Complaint  Patient presents with  . Thoracic Aortic Aneurysm    6 month f/u with CTA Chest    History of Present Illness:    Patient is a 80 -year-old male who presented to cardiology in April of 2013 with increasing congestive heart failure, chronically elevated heart rate with atrial fibrillation in the 120s to 130s and left atrial clot. Since that time he's been started on Coumadin treated for heart failure, had a cardioversion and is maintaining sinus rhythm. His echo evidence of LV function has markedly improved. In his evaluation he was incidentally noted to have a dilated ascending aorta. He comes to the office today with follow up cta of the chest . He has not had a recent cardiac catheterization.   The patient notes that over the past several months he is still better, with less shortness of breath. He denies chest discomfort.  Current Activity/ Functional Status: Patient is independent with mobility/ambulation, transfers, ADL's, IADL's.   Past Medical History  Diagnosis Date  . Systolic congestive heart failure with reduced left ventricular function, NYHA class 1     EF is 20 to 25% per echo; felt to be due to reversible tachycardia induced CM  . A-fib May 2013    s/p TEE/cardioversion June 2013  . Anxiety   . Pneumonia May 2013  . HTN (hypertension)   . Thrombocytopenia   . Chronic anticoagulation     on coumadin - checked in [place]  . Aortic root enlargement     at least 5cm by CT  . Bronchitis   . Atrial thrombus   . Bradycardia   . Diverticulosis   . Hemorrhoids     Past Surgical History  Procedure Laterality Date  . Knee arthroscopy   rt knee  . Prostate surgery    . Tee without cardioversion  12/30/2011    Procedure: TRANSESOPHAGEAL ECHOCARDIOGRAM (TEE);  Surgeon: Lelon Perla, MD;  Location: [facility] ENDOSCOPY;  Service: Cardiovascular;  Laterality: N/A;  . Cardioversion  12/30/2011    Procedure: CARDIOVERSION;  Surgeon: Lelon Perla, MD;  Location: [facility] ENDOSCOPY;  Service: Cardiovascular;  Laterality: N/A;  . Esophagogastroduodenoscopy  12/30/2011    Procedure: ESOPHAGOGASTRODUODENOSCOPY (EGD);  Surgeon: Lelon Perla, MD;  Location: [facility] ENDOSCOPY;  Service: Cardiovascular;  Laterality: N/A;  . Esophagogastroduodenoscopy  12/30/2011    Procedure: ESOPHAGOGASTRODUODENOSCOPY (EGD);  Surgeon: Lafayette Dragon, MD;  Location: [facility] ENDOSCOPY;  Service: Endoscopy;  Laterality: N/A;  . Tee without cardioversion  02/16/2012    Procedure: TRANSESOPHAGEAL ECHOCARDIOGRAM (TEE);  Surgeon: Lelon Perla, MD;  Location: [facility] ENDOSCOPY;  Service: Cardiovascular;  Laterality: N/A;  . Cardioversion  02/16/2012    Procedure: CARDIOVERSION;  Surgeon: Lelon Perla, MD;  Location: [place];  Service: Cardiovascular;  Laterality: N/A;  Family History  Problem Relation Age of Onset  . Cancer Brother     stomach  . Heart disease Mother 19  . Heart disease Father   . Heart disease Sister   . Heart disease Brother 84 died mi, and 18 died of mi  . Hypertension Sister       History  Smoking status  . Never Smoker   Smokeless tobacco  . Never Used    History  Alcohol Use No     Allergies  Allergen Reactions  . Codeine Other (See Comments)    nervousness  . Simvastatin Other (See Comments)    unknown  . Vicodin [Hydrocodone-Acetaminophen] Other (See Comments)    nervousness    Current Outpatient Prescriptions  Medication Sig Dispense Refill  . albuterol (PROVENTIL HFA;VENTOLIN HFA) 108 (90 BASE) MCG/ACT inhaler Inhale 2 puffs into the lungs every 6 (six) hours as needed for wheezing or shortness of breath.      .  clorazepate (TRANXENE) 3.75 MG tablet Take 3.75 mg by mouth daily as needed. For anxiety      . fluticasone (FLONASE) 50 MCG/ACT nasal spray Place 1 spray into both nostrils daily as needed for allergies or rhinitis.      . furosemide (LASIX) 80 MG tablet Take 0.5 tablets (40 mg total) by mouth daily.  90 tablet  1  . lisinopril (PRINIVIL,ZESTRIL) 5 MG tablet TAKE ONE TABLET BY MOUTH ONCE DAILY  90 tablet  0  . metoprolol tartrate (LOPRESSOR) 25 MG tablet Take 1 tablet (25 mg total) by mouth 2 (two) times daily.  180 tablet  0  . nitroGLYCERIN (NITROSTAT) 0.4 MG SL tablet Place 1 tablet (0.4 mg total) under the tongue every 5 (five) minutes x 3 doses as needed for chest pain.  30 tablet  6  . omeprazole (PRILOSEC) 20 MG capsule Take 20 mg by mouth daily.      . polyethylene glycol (MIRALAX / GLYCOLAX) packet Take 17 g by mouth daily.      Marland Kitchen warfarin (COUMADIN) 5 MG tablet Take as directed by coumadin clinic  120 tablet  1   No current facility-administered medications for this visit.       Review of Systems:     Cardiac Review of Systems: Y or N  Chest Pain [ n   ]  Resting SOB [  n ] Exertional SOB  [better  ]  Orthopnea [ better ]   Pedal Edema [ y  ]    Palpitations Blue.Reese  ] Syncope  Florencio.Farrier  ]   Presyncope [ n  ]  General Review of Systems: [Y] = yes [  ]=no Constitional: recent weight change Blue.Reese  ]; anorexia [  ]; fatigue [  ]; nausea [  ]; night sweats [ n ]; fever [ n ]; or chills [ n ];  Dental: poor dentition[y  ]; Last Dentist visit:couple years  Eye : blurred vision [  ]; diplopia [   ]; vision changes [  ];  Amaurosis fugax[  ]; Resp: cough [ n ];  wheezing[ n ];  hemoptysis[ n ]; shortness of breath[y  ]; paroxysmal nocturnal dyspnea[  ]; dyspnea on exertion[  ]; or orthopnea[  ];  GI:  gallstones[  ], vomiting[  ];  dysphagia[  ]; melena[  ];   hematochezia [  ]; heartburn[  ];   Hx of  Colonoscopy[  ]; GU: kidney stones [  ]; hematuria[  ];   dysuria [  ];  nocturia[  ];  history of     obstruction [  ];             Skin: rash, swelling[  ];, hair loss[  ];  peripheral edema[  ];  or itching[  ]; Musculosketetal: myalgias[  ];  joint swelling[  ];  joint erythema[  ];  joint pain[  ];  back pain[  ];  Heme/Lymph: bruising[  ];  bleeding[  ];  anemia[  ];  Neuro: TIA[ n ];  headaches[  ];  stroke[  ];  vertigo[  ];  seizures[  ];   paresthesias[  ];  difficulty walking[ n ];  Psych:depression[  ]; anxiety[  ];  Endocrine: diabetes[  ];  thyroid dysfunction[  ];  Immunizations: Flu Florencio.Farrier  ]; Pneumococcal[  n];  Other:  Physical Exam: BP 110/58  Pulse 60  Resp 16  Ht 5\' 10"  (1.778 m)  Wt 195 lb (88.451 kg)  BMI 27.98 kg/m2  SpO2 97% Overall the patient looks better than on his previous visit to the office General appearance: alert, cooperative and no distress Neurologic: intact Heart: regular rate and rhythm, S1, S2 normal, 2/6  Murmur of AI, click, rub or gallop and normal apical impulse Lungs: clear to auscultation bilaterally and normal percussion bilaterally Abdomen: soft, non-tender; bowel sounds normal; no masses,  no organomegaly Extremities: extremities normal, atraumatic, no cyanosis or edema and Homans sign is negative, no sign of DVT Patient has no carotid bruits, has no cervical supraclavicular adenopathy, has full DP PT radial brachial and femoral pulses Patient appears to be in sinus rhythm on exam,   He has a known thyroid mass 5 cm in size but I cannot appreciate this on physical exam   Diagnostic Studies & Laboratory data:     Recent Radiology Findings:  Ct Angio Chest Aorta W/cm &/or Wo/cm  04/26/2014   CLINICAL DATA:  Followup evaluation of thoracic aortic aneurysm.  EXAM: CT ANGIOGRAPHY CHEST WITH CONTRAST  TECHNIQUE: Multidetector CT imaging of the chest was performed using the standard protocol during  bolus administration of intravenous contrast. Multiplanar CT image reconstructions and MIPs were obtained to evaluate the vascular anatomy.  CONTRAST:  51mL OMNIPAQUE IOHEXOL 350 MG/ML SOLN  COMPARISON:  Chest CTA 10/06/2013.  FINDINGS: Mediastinum: There continues to be aneurysmal dilatation of the aortic root which measures up to 5.5 cm at the level of the sinuses of Valsalva (images 86 of series 4 and coronal image 57 of series 601). This is unchanged. There appears to be effacement of the sinotubular junction. On sagittal images, there appears to be a tiny central area of malcoaptation in the aortic valve, suggesting the presence of mild aortic insufficiency. The ascending thoracic aorta is mildly aneurysmal at the level of the pulmonic trunk measuring 4.6 cm in diameter. Thoracic aortic arch  and descending thoracic aorta are mildly ectatic measuring 3.2 cm and 2.9 cm in diameter respectively. No evidence of thoracic aortic dissection at this time.  Cardiomegaly with mild left ventricular dilatation. There is no significant pericardial fluid, thickening or pericardial calcification. There is atherosclerosis of the thoracic aorta, the great vessels of the mediastinum and the coronary arteries, including calcified atherosclerotic plaque in the left main, left anterior descending and right coronary arteries. No pathologically enlarged mediastinal or hilar lymph nodes. Esophagus is unremarkable in appearance. Dominant thyroid nodule in the left lobe of the thyroid gland is similar to the recent prior examination measuring up to 5.7 x 3.6 cm.  Lungs/Pleura: No acute consolidative airspace disease. No pleural effusions. No suspicious appearing pulmonary nodules or masses. Mild scarring in the lung bases bilaterally is unchanged.  Upper Abdomen: Large exophytic simple cyst from the upper pole of the right kidney is similar to the prior study measuring 4.7 x 3.7 cm on today's examination. Small calcified granuloma in  the left lobe of the liver.  Musculoskeletal: There are no aggressive appearing lytic or blastic lesions noted in the visualized portions of the skeleton.  Review of the MIP images confirms the above findings.  IMPRESSION: 1. Persistent dilatation of the aortic root which measures up to 5.5 cm in diameter at the level of sinuses of Valsalva. The images obtained today were incidentally obtained during diastole, and sagittal reconstructions suggest the presence of a small area of malcoaptation in the central aspect of the aortic valve, suggesting mild aortic insufficiency. 2. Mild aneurysmal dilatation of the ascending thoracic aorta (4.6 cm in diameter) with mild ectasia of the aortic arch and descending thoracic aorta. 3. Calcified atherosclerotic plaque is noted in the left main, left anterior descending and right coronary arteries. 4. Additional incidental findings, similar to prior studies, as detailed above.   Electronically Signed   By: Vinnie Langton M.D.   On: 04/26/2014 14:50    Ct Angio Chest Aorta W/cm &/or Wo/cm  03/24/2013   *RADIOLOGY REPORT*  Clinical Data: Known thoracic aortic aneurysm  CT ANGIOGRAPHY CHEST  Technique:  Multidetector CT imaging of the chest using the standard protocol during bolus administration of intravenous contrast. Multiplanar reconstructed images including MIPs were obtained and reviewed to evaluate the vascular anatomy.  Contrast: 57mL OMNIPAQUE IOHEXOL 350 MG/ML SOLN  Comparison: 01/13/2012  Findings: The lungs are well-aerated bilaterally.  Some minimal scarring is noted in the left lung base stable from prior exam.  No sizable parenchymal nodule is seen.  No focal infiltrate is noted.  There are changes in the thyroid gland with a large left thyroid nodule which is increased in the interval from prior exam now measuring almost 5 cm.  It previously measured 3.8 cm.  Small right- sided thyroid nodules are noted as well.  There is prominence of the ascending aorta again  identified.  When measured at a similar level to that on the prior exam the aorta and now measures 4.6 x 4.2 cm in greatest transverse and AP dimensions respectively.  It previously measured 4.2 x 4.0 cm in similar dimensions.  At the level of the coronary arteries the aortic root measures 5.8 cm this is best visualized on the coronal imaging. It previously measured 5.1 cm in this region.  The aorta tapers in a normal fashion after the origins of the great vessels.  A tricuspid aortic valve is noted.  Heavy coronary calcifications are noted in the left main coronary artery left anterior descending  coronary artery.  Lesser calcifications are noted in the right coronary artery.  No significant hilar or mediastinal adenopathy is noted.  The visualized portions of the pulmonary artery are within normal limits.  Scanning into the upper abdomen reveals a right renal cyst which is stable from prior exam.  A few small dependent gallstones are now visualized.  Bony structures are within normal limits.  IMPRESSION: Dilatation of the aortic root and ascending aorta which has increased in the interval from prior exam as described above.  Enlarging thyroid nodules particularly on the left.   Original Report Authenticated By: Inez Catalina, M.D.     Ct Angio Chest Aortic Dissect W &/or W/o  08/16/2012  *RADIOLOGY REPORT*  Clinical Data: Enlarged aorta  CT ANGIOGRAPHY CHEST  Technique:  Multidetector CT imaging of the chest using the standard protocol during bolus administration of intravenous contrast. Multiplanar reconstructed images including MIPs were obtained and reviewed to evaluate the vascular anatomy.  Contrast: 174mL OMNIPAQUE IOHEXOL 350 MG/ML SOLN  Comparison: 01/13/2012  Findings: Dilatation of the aortic root is again noted.  Diameters of the aorta at the sinus of Valsalva, sinotubular junction, and in the ascending aorta are 5.2, 4.5, and 4.6 cm respectively. Previously, the aortic root was 5.1 cm in diameter.  Diameters at the aortic arch and descending aorta are 3.1 and 3.6 cm.  The innominate artery is tortuous and ectatic with maximal diameter of 1.6 cm.  Right subclavian and common carotid arteries are patent.  Mild calcified atherosclerotic change at the origin of the right vertebral artery.  Left common carotid and subclavian arteries are patent.  Atherosclerotic changes at the origin of the left vertebral artery are present making degree of narrowing at its origin difficult.  Coronary artery opacification is difficult due to motion artifact. Atherosclerotic changes at the right coronary artery branches, left main coronary artery, left anterior descending coronary artery are present.  Cardiomegaly is present.  The heart is dilated.  The left ventricle, in particular is significantly dilated without significant calcification of the aortic valve suggesting aortic valve insufficiency. Left atrium is not particularly dilated.  No obvious filling defects in the pulmonary arterial tree to suggest acute pulmonary thromboembolism.  The left lobe of the thyroid mass has further enlarged measuring 5.3 x 3.3  centimeters on image 9.  Negative abnormal mediastinal adenopathy.  Negative pericardial effusion.  No evidence of pulmonary parenchymal mass or consolidation.  No pneumothorax.  No pleural effusion  Calcified central disc herniation neck T9-10 causes an element of spinal stenosis.  Diffuse hepatic steatosis.  Tiny hypodensity in the left lobe of the liver is stable.  Stable benign appearing right renal cyst.  Small gallstone.  Atherosclerotic changes at the origin of the SMA without significant narrowing.  Severe narrowing in the proximal celiac.  IMPRESSION: Stable dilatation of the aortic root.  Diameter at the sinus of Valsalva is 5.2 cm.  There is dilatation of the left ventricle without significant valvular calcifications suggesting aortic valve regurgitation.  Left thyroid mass has further enlarged.  Correlation  with sonography is recommended.  Cholelithiasis.  Stable incidental findings.   Original Report Authenticated By: Marybelle Killings, M.D.    Recent Lab Findings: Lab Results  Component Value Date   WBC 5.2 11/08/2013   HGB 10.4* 11/08/2013   HCT 30.3* 11/08/2013   PLT 95.0* 11/08/2013   GLUCOSE 73 11/08/2013   CHOL 85 12/29/2011   TRIG 30 12/29/2011   HDL 40 12/29/2011   LDLCALC 39 12/29/2011  ALT 14 03/01/2013   AST 19 03/01/2013   NA 139 11/08/2013   K 4.3 11/08/2013   CL 103 11/08/2013   CREATININE 0.90 04/26/2014   BUN 24* 04/26/2014   CO2 28 11/08/2013   TSH 0.498 12/28/2011   INR 3.3 08/16/2013   Aortic Size Index=      5.2   /Body surface area is 2.09 meters squared. =2.45  < 2.75 cm/m2      4% risk per year 2.75 to 4.25          8% risk per year > 4.25 cm/m2    20% risk per year   Assessment / Plan:   Aortic root enlargement  With out AI, at the sinus  5.2-5.5cm mid ascending aorta 4.5 cm, with some enlargement at root.- unchanged History of Atrial fibrillation  Maintaining sinus rhythm  Continues  coumadin Chronic systolic heart failure with echo EF 40-45% hypertension  Multiple thyroid nodules unchanged Since last seen the patient seems to be walking slower, more for wide stance gait and needs his wife to keep his thoughts straight.    I have reviewed with the patient and his wife the current scan risks and options involved with elective replacement of his aortic root at age 28. The risk of  rupture and/or dissection have been discussed with them.  Question of bone marrow disease with anemia and thrombocytopenia being evaluated by [facility] hematology Currently with age and medical condition continue observation  Return 8 months with follow up [place] MD  Beeper [phone] Office [phone] 04/26/2014 4:22 PM

## 2014-05-15 ENCOUNTER — Encounter: Payer: Self-pay | Admitting: Nurse Practitioner

## 2014-05-15 ENCOUNTER — Ambulatory Visit (INDEPENDENT_AMBULATORY_CARE_PROVIDER_SITE_OTHER): Payer: Medicare Other | Admitting: Nurse Practitioner

## 2014-05-15 VITALS — BP 118/62 | HR 52 | Ht 70.0 in | Wt 204.8 lb

## 2014-05-15 DIAGNOSIS — I4891 Unspecified atrial fibrillation: Secondary | ICD-10-CM

## 2014-05-15 DIAGNOSIS — Z7901 Long term (current) use of anticoagulants: Secondary | ICD-10-CM

## 2014-05-15 DIAGNOSIS — I5022 Chronic systolic (congestive) heart failure: Secondary | ICD-10-CM

## 2014-05-15 DIAGNOSIS — I480 Paroxysmal atrial fibrillation: Secondary | ICD-10-CM

## 2014-05-15 MED ORDER — FUROSEMIDE 40 MG PO TABS: 40.0000 mg | ORAL_TABLET | Freq: Every day | ORAL | Status: DC

## 2014-05-15 MED ORDER — METOPROLOL TARTRATE 25 MG PO TABS: 25.0000 mg | ORAL_TABLET | Freq: Two times a day (BID) | ORAL | Status: DC

## 2014-05-15 MED ORDER — NITROGLYCERIN 0.4 MG SL SUBL: 0.4000 mg | SUBLINGUAL_TABLET | SUBLINGUAL | Status: AC | PRN

## 2014-05-15 MED ORDER — LISINOPRIL 5 MG PO TABS: ORAL_TABLET | ORAL | Status: DC

## 2014-05-15 NOTE — Progress Notes (Addendum)
Jimmy Myers Date of Birth: 15-Feb-1932 Medical Record #[id number]  History of Present Illness: Jimmy Myers is seen back today for a 6 month check. Seen for Dr. [name]. He has systolic heart failure with an EF down to 20 to 25% with improvement after medical therapy - up to 50% per echo in August of 2013 and was 45 to 50% by echo in July of 2014. His other issues include atrial fib with prior cardioversion, bradycardia, enlarged ascending aorta (followed by Dr. [name]), thyroid nodules (with benign biopsy) and HTN. Had an event monitor back last year - average HR of 61.  Last seen by me was in March of 2015. Was doing ok. Still had some times where he got tired - but did not want to cut any medicines back - he likes to keep his heart rate low.   Comes back today. Here with his wife. Doing ok. Says he "does not feel like a teenager". Little slower. No real palpitations. Now on IV iron per the [facility] hematology. Has had recent labs with the [place] which I have reviewed today - looks pretty good. No chest pain. Breathing ok. Seems to be moving a little slower. Some pain in his knees. Legs not as strong and his balance is a little off. No falls. Weight stable until this past weekend - says he ate too much. Probably got too much salt. Weight typically at 195.   Current Outpatient Prescriptions  Medication Sig Dispense Refill  . albuterol (PROVENTIL HFA;VENTOLIN HFA) 108 (90 BASE) MCG/ACT inhaler Inhale 2 puffs into the lungs every 6 (six) hours as needed for wheezing or shortness of breath.      . clorazepate (TRANXENE) 3.75 MG tablet Take 3.75 mg by mouth daily as needed. For anxiety      . fluticasone (FLONASE) 50 MCG/ACT nasal spray Place 1 spray into both nostrils daily as needed for allergies or rhinitis.      Marland Kitchen lisinopril (PRINIVIL,ZESTRIL) 5 MG tablet TAKE ONE TABLET BY MOUTH ONCE DAILY  90 tablet  0  . metoprolol tartrate (LOPRESSOR) 25 MG tablet Take 1 tablet (25 mg total) by mouth 2 (two) times daily.  180  tablet  0  . nitroGLYCERIN (NITROSTAT) 0.4 MG SL tablet Place 1 tablet (0.4 mg total) under the tongue every 5 (five) minutes x 3 doses as needed for chest pain.  25 tablet  6  . omeprazole (PRILOSEC) 20 MG capsule Take 20 mg by mouth daily.      . polyethylene glycol (MIRALAX / GLYCOLAX) packet Take 17 g by mouth daily.      Marland Kitchen warfarin (COUMADIN) 5 MG tablet Take as directed by coumadin clinic  120 tablet  1  . furosemide (LASIX) 40 MG tablet Take 1 tablet (40 mg total) by mouth daily.  90 tablet  3   No current facility-administered medications for this visit.    Allergies  Allergen Reactions  . Codeine Other (See Comments)    nervousness  . Simvastatin Other (See Comments)    unknown  . Vicodin [Hydrocodone-Acetaminophen] Other (See Comments)    nervousness    Past Medical History  Diagnosis Date  . Systolic congestive heart failure with reduced left ventricular function, NYHA class 1     EF is 20 to 25% per echo; felt to be due to reversible tachycardia induced CM  . A-fib May 2013    s/p TEE/cardioversion June 2013  . Anxiety   . Pneumonia May 2013  . HTN (  hypertension)   . Thrombocytopenia   . Chronic anticoagulation     on coumadin - checked in [place]  . Aortic root enlargement     at least 5cm by CT  . Bronchitis   . Atrial thrombus   . Bradycardia   . Diverticulosis   . Hemorrhoids     Past Surgical History  Procedure Laterality Date  . Knee arthroscopy  rt knee  . Prostate surgery    . Tee without cardioversion  12/30/2011    Procedure: TRANSESOPHAGEAL ECHOCARDIOGRAM (TEE);  Surgeon: Lelon Perla, MD;  Location: [facility] ENDOSCOPY;  Service: Cardiovascular;  Laterality: N/A;  . Cardioversion  12/30/2011    Procedure: CARDIOVERSION;  Surgeon: Lelon Perla, MD;  Location: [facility] ENDOSCOPY;  Service: Cardiovascular;  Laterality: N/A;  . Esophagogastroduodenoscopy  12/30/2011    Procedure: ESOPHAGOGASTRODUODENOSCOPY (EGD);  Surgeon: Lelon Perla, MD;  Location: [facility]  ENDOSCOPY;  Service: Cardiovascular;  Laterality: N/A;  . Esophagogastroduodenoscopy  12/30/2011    Procedure: ESOPHAGOGASTRODUODENOSCOPY (EGD);  Surgeon: Lafayette Dragon, MD;  Location: [facility] ENDOSCOPY;  Service: Endoscopy;  Laterality: N/A;  . Tee without cardioversion  02/16/2012    Procedure: TRANSESOPHAGEAL ECHOCARDIOGRAM (TEE);  Surgeon: Lelon Perla, MD;  Location: [facility] ENDOSCOPY;  Service: Cardiovascular;  Laterality: N/A;  . Cardioversion  02/16/2012    Procedure: CARDIOVERSION;  Surgeon: Lelon Perla, MD;  Location: [facility] ENDOSCOPY;  Service: Cardiovascular;  Laterality: N/A;    History  Smoking status  . Never Smoker   Smokeless tobacco  . Never Used    History  Alcohol Use No    Family History  Problem Relation Age of Onset  . Cancer Brother     stomach  . Heart disease Mother   . Heart disease Father   . Heart disease Sister   . Heart disease Brother   . Hypertension Sister     Review of Systems: The review of systems is per the HPI.  All other systems were reviewed and are negative.  Physical Exam: BP 118/62  Pulse 52  Ht 5\' 10"  (1.778 m)  Wt 204 lb 12.8 oz (92.897 kg)  BMI 29.39 kg/m2 Patient is very pleasant and in no acute distress. Skin is warm and dry. Color is normal.  HEENT is unremarkable. Normocephalic/atraumatic. PERRL. Sclera are nonicteric. Neck is supple. No masses. No JVD. Lungs are clear. Cardiac exam shows a regular rate and rhythm. Soft outflow murmur. Abdomen is soft. Extremities are without edema. Gait and ROM are intact but he is shuffling. No gross neurologic deficits noted.  Wt Readings from Last 3 Encounters:  05/15/14 204 lb 12.8 oz (92.897 kg)  04/26/14 195 lb (88.451 kg)  11/08/13 204 lb 6.4 oz (92.715 kg)    LABORATORY DATA/PROCEDURES:  Lab Results  Component Value Date   WBC 5.2 11/08/2013   HGB 10.4* 11/08/2013   HCT 30.3* 11/08/2013   PLT 95.0* 11/08/2013   GLUCOSE 73 11/08/2013   CHOL 85 12/29/2011   TRIG 30 12/29/2011   HDL  40 12/29/2011   LDLCALC 39 12/29/2011   ALT 14 03/01/2013   AST 19 03/01/2013   NA 139 11/08/2013   K 4.3 11/08/2013   CL 103 11/08/2013   CREATININE 0.90 04/26/2014   BUN 24* 04/26/2014   CO2 28 11/08/2013   TSH 0.498 12/28/2011   INR 3.3 08/16/2013    BNP (last 3 results) No results found for this basename: PROBNP,  in the last 8760 hours  Echo Study Conclusions from  July 2014  - Left ventricle: The cavity size was mildly dilated. Wall thickness was normal. Systolic function was mildly reduced. The estimated ejection fraction was in the range of 45% to 50%. Diffuse hypokinesis. Doppler parameters are consistent with abnormal left ventricular relaxation (grade 1 diastolic dysfunction). - Aortic valve: Mild regurgitation. - Aorta: Fusiform dilatation of the ascending aorta from the sinuses to the root The aorta was severely dilated. - Mitral valve: Mild regurgitation. - Left atrium: The atrium was mildly dilated.  Assessment / Plan:  1. PAF - in sinus by exam today. He is stable from our standpoint. I will see him in 6 months.  2. Chronic coumadin therapy - checked by PCP - no problems noted.   3. Systolic heart failure - last EF in July of 2014 was 45 to 50% - weight up today - admits to too much salt from the weekend - he is planning on taking extra diuretic for a day or so. He is having trouble splitting the 80 mg tab so I have changed his RX to 40 mg.   4. Enlarged aorta - followed by Dr. [name] - just seen last month. For recheck in 8 months.   5. [facility].  I will see him back in 6 months.   Patient is agreeable to this plan and will call if any problems develop in the interim.   Burtis Junes, RN, [place] [address] [place], [place]  [address] [phone]   Addendum: Patient told me at the end of the visit and was walking out = he had had a fall  back in July - was out at the [place] - had been sitting on a 2 ft ladder and fell back - did not get hurt but noted his legs just felt weak. He does shuffle when he walks. Needs to see PCP. Encouraged to use a cane. He is to let us know if he has any more falls.

## 2014-05-15 NOTE — Patient Instructions (Addendum)
Stay on your current medicines but take an extra lasix for the next day or so to get your weight back down.   I have changed the Lasix to the 40 mg tablets -  This was sent to the drug store  I have refilled your other medicines.  I will see you in 6 months.  Call the [place] office at [phone] if you have any questions, problems or concerns.

## 2014-08-14 ENCOUNTER — Other Ambulatory Visit: Payer: Self-pay | Admitting: Nurse Practitioner

## 2014-09-01 DIAGNOSIS — C801 Malignant (primary) neoplasm, unspecified: Secondary | ICD-10-CM

## 2014-09-01 HISTORY — DX: Malignant (primary) neoplasm, unspecified: C80.1

## 2014-09-25 ENCOUNTER — Other Ambulatory Visit: Payer: Self-pay | Admitting: Nurse Practitioner

## 2014-11-07 ENCOUNTER — Other Ambulatory Visit: Payer: Self-pay | Admitting: Internal Medicine

## 2014-11-13 ENCOUNTER — Ambulatory Visit: Payer: Medicare Other | Admitting: Nurse Practitioner

## 2014-11-21 ENCOUNTER — Other Ambulatory Visit: Payer: Self-pay | Admitting: *Deleted

## 2014-11-21 ENCOUNTER — Encounter: Payer: Self-pay | Admitting: Nurse Practitioner

## 2014-11-21 ENCOUNTER — Ambulatory Visit (INDEPENDENT_AMBULATORY_CARE_PROVIDER_SITE_OTHER): Payer: Medicare HMO | Admitting: Nurse Practitioner

## 2014-11-21 VITALS — BP 108/58 | HR 53 | Ht 70.0 in | Wt 203.4 lb

## 2014-11-21 DIAGNOSIS — I10 Essential (primary) hypertension: Secondary | ICD-10-CM | POA: Diagnosis not present

## 2014-11-21 DIAGNOSIS — I480 Paroxysmal atrial fibrillation: Secondary | ICD-10-CM

## 2014-11-21 DIAGNOSIS — I7789 Other specified disorders of arteries and arterioles: Secondary | ICD-10-CM

## 2014-11-21 DIAGNOSIS — I5022 Chronic systolic (congestive) heart failure: Secondary | ICD-10-CM | POA: Diagnosis not present

## 2014-11-21 DIAGNOSIS — I77819 Aortic ectasia, unspecified site: Secondary | ICD-10-CM

## 2014-11-21 DIAGNOSIS — R001 Bradycardia, unspecified: Secondary | ICD-10-CM

## 2014-11-21 NOTE — Patient Instructions (Signed)
Stay on your current medicines  I will see you in 8 months  Call the [place] office at [phone] if you have any questions, problems or concerns.

## 2014-11-21 NOTE — Progress Notes (Signed)
CARDIOLOGY OFFICE NOTE  Date:  11/21/2014    Jimmy Myers Date of Birth: Mar 25, 1932 Medical Record #[id number]  PCP:  [name], MD  Cardiologist:  [name]    Chief Complaint  Patient presents with  . Congestive Heart Failure    6 month check - seen for Dr. [name]  . Atrial Fibrillation    History of Present Illness: Jimmy Myers is a 81 y.o. male who presents today for a 6 month check. Seen for Dr. [name]. He has systolic heart failure with an EF down to 20 to 25% with improvement after medical therapy - up to 50% per echo in August of 2013 and was 45 to 50% by echo in July of 2014. His other issues include atrial fib with prior cardioversion, bradycardia, enlarged ascending aorta (followed by Dr. [name]), thyroid nodules (with benign biopsy) and HTN. Had an event monitor back last year - average HR of 61. He has had issues with anemia and is followed at the [facility].   Last seen by me was in September of 2015. Was doing ok.  He still had some times where he got tired - but did not want to cut any medicines back - he likes to keep his heart rate low.   Comes back today. Here with his wife. He seems to be doing ok. Says he is "doing the best I can". No chest pain. Not dizzy. Breathing is good. Weight is stable. No swelling. Goes to the [place] for his "blood" - just had a bone marrow biopsy - results pending. He says there may be a little blood in his stool - he has made them aware. He is up to date on colonoscopy. His coumadin is followed by primary care and his labs are all checked by the [place].  Past Medical History  Diagnosis Date  . Systolic congestive heart failure with reduced left ventricular function, NYHA class 1     EF is 20 to 25% per echo; felt to be due to reversible tachycardia induced CM  . A-fib May 2013    s/p TEE/cardioversion June 2013  . Anxiety   . Pneumonia May 2013  . HTN (hypertension)   . Thrombocytopenia   . Chronic anticoagulation       on coumadin - checked in [place]  . Aortic root enlargement     at least 5cm by CT  . Bronchitis   . Atrial thrombus   . Bradycardia   . Diverticulosis   . Hemorrhoids     Past Surgical History  Procedure Laterality Date  . Knee arthroscopy  rt knee  . Prostate surgery    . Tee without cardioversion  12/30/2011    Procedure: TRANSESOPHAGEAL ECHOCARDIOGRAM (TEE);  Surgeon: Lelon Perla, MD;  Location: [facility] ENDOSCOPY;  Service: Cardiovascular;  Laterality: N/A;  . Cardioversion  12/30/2011    Procedure: CARDIOVERSION;  Surgeon: Lelon Perla, MD;  Location: [facility] ENDOSCOPY;  Service: Cardiovascular;  Laterality: N/A;  . Esophagogastroduodenoscopy  12/30/2011    Procedure: ESOPHAGOGASTRODUODENOSCOPY (EGD);  Surgeon: Lelon Perla, MD;  Location: [facility] ENDOSCOPY;  Service: Cardiovascular;  Laterality: N/A;  . Esophagogastroduodenoscopy  12/30/2011    Procedure: ESOPHAGOGASTRODUODENOSCOPY (EGD);  Surgeon: Lafayette Dragon, MD;  Location: [facility] ENDOSCOPY;  Service: Endoscopy;  Laterality: N/A;  . Tee without cardioversion  02/16/2012    Procedure: TRANSESOPHAGEAL ECHOCARDIOGRAM (TEE);  Surgeon: Lelon Perla, MD;  Location: [place];  Service: Cardiovascular;  Laterality: N/A;  .  Cardioversion  02/16/2012    Procedure: CARDIOVERSION;  Surgeon: Lelon Perla, MD;  Location: [facility] ENDOSCOPY;  Service: Cardiovascular;  Laterality: N/A;     Medications: Current Outpatient Prescriptions  Medication Sig Dispense Refill  . albuterol (PROVENTIL HFA;VENTOLIN HFA) 108 (90 BASE) MCG/ACT inhaler Inhale 2 puffs into the lungs every 6 (six) hours as needed for wheezing or shortness of breath.    . cetirizine (ZYRTEC) 10 MG tablet Take 10 mg by mouth daily.    . clorazepate (TRANXENE) 3.75 MG tablet Take 3.75 mg by mouth daily as needed. For anxiety    . fluticasone (FLONASE) 50 MCG/ACT nasal spray Place 1 spray into both nostrils daily as needed for allergies or rhinitis.    . furosemide (LASIX) 40 MG  tablet Take 1 tablet (40 mg total) by mouth daily. 90 tablet 3  . lisinopril (PRINIVIL,ZESTRIL) 5 MG tablet TAKE ONE TABLET BY MOUTH ONCE DAILY 90 tablet 0  . metoprolol tartrate (LOPRESSOR) 25 MG tablet TAKE ONE TABLET BY MOUTH TWICE DAILY 180 tablet 0  . nitroGLYCERIN (NITROSTAT) 0.4 MG SL tablet Place 1 tablet (0.4 mg total) under the tongue every 5 (five) minutes x 3 doses as needed for chest pain. 25 tablet 6  . omeprazole (PRILOSEC) 20 MG capsule Take 20 mg by mouth daily.    . polyethylene glycol (MIRALAX / GLYCOLAX) packet Take 17 g by mouth daily.    Marland Kitchen warfarin (COUMADIN) 5 MG tablet Take as directed by coumadin clinic 120 tablet 1   No current facility-administered medications for this visit.    Allergies: Allergies  Allergen Reactions  . Codeine Other (See Comments)    nervousness  . Simvastatin Other (See Comments)    unknown  . Vicodin [Hydrocodone-Acetaminophen] Other (See Comments)    nervousness    Social History: The patient  reports that he has never smoked. He has never used smokeless tobacco. He reports that he does not drink alcohol or use illicit drugs.   Family History: The patient's family history includes Cancer in his brother; Heart disease in his brother, father, mother, and sister; Hypertension in his sister.   Review of Systems: Please see the history of present illness.   Otherwise, the review of systems is positive for .   All other systems are reviewed and negative.   Physical Exam: VS:  BP 108/58 mmHg  Pulse 53  Ht $R'5\' 10"'Lm$  (1.778 m)  Wt 203 lb 6.4 oz (92.262 kg)  BMI 29.18 kg/m2 .  BMI Body mass index is 29.18 kg/(m^2).  Wt Readings from Last 3 Encounters:  11/21/14 203 lb 6.4 oz (92.262 kg)  05/15/14 204 lb 12.8 oz (92.897 kg)  04/26/14 195 lb (88.451 kg)    General: Pleasant. Well developed, well nourished and in no acute distress. His weight is down a pound.  HEENT: Normal. Neck: Supple, no JVD, carotid bruits, or masses noted.    Cardiac: Regular rate and rhythm. No murmurs, rubs, or gallops. No edema.  Respiratory:  Lungs are clear to auscultation bilaterally with normal work of breathing.  GI: Soft and nontender.  MS: No deformity or atrophy. Gait and ROM intact. Skin: Warm and dry. Color is normal.  Neuro:  Strength and sensation are intact and no gross focal deficits noted.  Psych: Alert, appropriate and with normal affect.   LABORATORY DATA:  EKG:  EKG is ordered today. This demonstrates sinus brady.  Lab Results  Component Value Date   WBC 5.2 11/08/2013   HGB  10.4* 11/08/2013   HCT 30.3* 11/08/2013   PLT 95.0* 11/08/2013   GLUCOSE 73 11/08/2013   CHOL 85 12/29/2011   TRIG 30 12/29/2011   HDL 40 12/29/2011   LDLCALC 39 12/29/2011   ALT 14 03/01/2013   AST 19 03/01/2013   NA 139 11/08/2013   K 4.3 11/08/2013   CL 103 11/08/2013   CREATININE 0.90 04/26/2014   BUN 24* 04/26/2014   CO2 28 11/08/2013   TSH 0.498 12/28/2011   INR 3.3 08/16/2013    BNP (last 3 results) No results for input(s): BNP in the last 8760 hours.  ProBNP (last 3 results) No results for input(s): PROBNP in the last 8760 hours.   Other Studies Reviewed Today:  Echo Study Conclusions from July 2014  - Left ventricle: The cavity size was mildly dilated. Wall thickness was normal. Systolic function was mildly reduced. The estimated ejection fraction was in the range of 45% to 50%. Diffuse hypokinesis. Doppler parameters are consistent with abnormal left ventricular relaxation (grade 1 diastolic dysfunction). - Aortic valve: Mild regurgitation. - Aorta: Fusiform dilatation of the ascending aorta from the sinuses to the root The aorta was severely dilated. - Mitral valve: Mild regurgitation. - Left atrium: The atrium was mildly dilated.  Assessment / Plan:  1. PAF - in sinus by exam today and by EKG. He is stable from our standpoint. I will see him in 8 months.  2. Chronic coumadin therapy - checked by PCP - no  problems noted.   3. Systolic heart failure - last EF in July of 2014 was 45 to 50% - he looks compensated.   4. Enlarged aorta - followed by Dr. [name] - sees later next month  5. [facility] and has just had a bone marrow biopsy.  Current medicines are reviewed with the patient today.  The patient does not have concerns regarding medicines other than what has been noted above.  The following changes have been made:  See above.  Labs/ tests ordered today include:    Orders Placed This Encounter  Procedures  . EKG 12-Lead     Disposition:   FU with me in 8  months  Patient is agreeable to this plan and will call if any problems develop in the interim.   Signed: Burtis Junes, RN, ANP-C 11/21/2014 2:37 PM  [place] [address] [place], [place]  [address] Phone: [phone] Fax: [phone]

## 2014-12-06 ENCOUNTER — Telehealth: Payer: Self-pay | Admitting: Nurse Practitioner

## 2014-12-06 NOTE — Telephone Encounter (Signed)
New Message   Patient needs a call back in regards to medication.   Please give a call.

## 2014-12-06 NOTE — Telephone Encounter (Signed)
Calling stating he is being treated at the [facility] for low platelets that are in the 40,000 range and he is going to have some type of treatment tomorrow- like chemo.  He is wanting to know if any of his meds are causing the platelets to be low.  When asked what the hematologist at [facility] has said he states they have told him to continue all of his meds and he has a form of leukemia.  Advised he should follow their advise and take all of his meds. He understands and will continue with all of his meds.

## 2014-12-08 ENCOUNTER — Telehealth: Payer: Self-pay | Admitting: *Deleted

## 2014-12-08 NOTE — Telephone Encounter (Signed)
Pt walked in today wanting [place] to do a Protime/INR but he hasn't been here since 2005. His PCP is Dr.Nylan at the [place] usually follows him and he was told to have it drawn today but nobody draws at the [place] on Friday so he was given a labslip to take to [facility] to have it drawn and then Dr.Nylan would review and call the pt back. Pt had blood drawn but is nervous he won't here back from them today. Advised pt we can't draw him since we don't have any current records, we don't know his dosage of coumadin. Pt advised to contact his PCP in regards to this. Pt and wife voiced understanding.

## 2014-12-11 ENCOUNTER — Telehealth: Payer: Self-pay | Admitting: *Deleted

## 2014-12-28 ENCOUNTER — Other Ambulatory Visit: Payer: Medicare HMO

## 2014-12-28 ENCOUNTER — Ambulatory Visit: Payer: Self-pay | Admitting: Cardiothoracic Surgery

## 2015-01-08 ENCOUNTER — Other Ambulatory Visit: Payer: Self-pay | Admitting: Internal Medicine

## 2015-02-02 ENCOUNTER — Encounter: Payer: Self-pay | Admitting: Gastroenterology

## 2015-02-05 ENCOUNTER — Other Ambulatory Visit: Payer: Self-pay | Admitting: Internal Medicine

## 2015-04-16 ENCOUNTER — Other Ambulatory Visit: Payer: Self-pay | Admitting: Internal Medicine

## 2015-06-15 ENCOUNTER — Encounter: Payer: Self-pay | Admitting: *Deleted

## 2015-06-15 ENCOUNTER — Telehealth: Payer: Self-pay | Admitting: Nurse Practitioner

## 2015-06-15 NOTE — Telephone Encounter (Signed)
Spoke with patient who states he is not feeling well since yesterday.  Complains of fatigue that is more noticeable lately and mild SOB.  States SOB is not as noticeable today; denies chest tightness; states weight is stable.  He is concerned about elevated heart rate of 100 bpm x 1 episode earlier today.  He has hx of atrial fib s/p DCCV June 2013.  He is unsure if his heart rate is regular or irregular.  He states his monitor has not indicated that it was irregular and his monitor is usually very accurate.  He speaks in clear complete sentences without difficulty.  States in the month of October his BP has been 935-701 mmHg systolic, with a one time high of 132 mmHg; states diastolic pressure has been 54-64 mmHg.  States he rechecked his heart rate a few minutes ago and it is 72 bpm.  He states his heart rate has been 44-52 bpm during the month of October and 42-62 bpm during the month of September.  He is currently undergoing treatment for lymphoma and states next chemotherapy is scheduled for 10/19.  He states he has been eating and drinking well; he is limited to 2 L liquid per day.  I advised him that medication changes are not likely to be made based on 1 episode of fast heart rate and to continue to monitor.  I advised that if he has reoccurring tachycardia and/or is concerned that he is back in a fib that we could schedule a sooner appointment (he is currently scheduled to see Truitt Merle, NP in November).  I advised him to call back with questions or concerns.  He verbalized understanding and agreement.

## 2015-06-15 NOTE — Telephone Encounter (Signed)
New message      Pt states he does not feel good.  His bp this am 108/66 and HR is 100.  He thinks he may be in afib.  Please call close to 11am.

## 2015-06-18 ENCOUNTER — Ambulatory Visit (INDEPENDENT_AMBULATORY_CARE_PROVIDER_SITE_OTHER): Payer: Medicare HMO | Admitting: Nurse Practitioner

## 2015-06-18 ENCOUNTER — Encounter: Payer: Self-pay | Admitting: Nurse Practitioner

## 2015-06-18 VITALS — BP 138/62 | HR 50 | Ht 70.0 in | Wt 208.6 lb

## 2015-06-18 DIAGNOSIS — I5022 Chronic systolic (congestive) heart failure: Secondary | ICD-10-CM

## 2015-06-18 DIAGNOSIS — I480 Paroxysmal atrial fibrillation: Secondary | ICD-10-CM | POA: Diagnosis not present

## 2015-06-18 DIAGNOSIS — R001 Bradycardia, unspecified: Secondary | ICD-10-CM | POA: Diagnosis not present

## 2015-06-18 NOTE — Patient Instructions (Addendum)
We will be checking the following labs today - NONE   Medication Instructions:    Continue with your current medicines for now.     Testing/Procedures To Be Arranged:  N/A  Follow-Up:   See me in 3 months    Other Special Instructions:   N/A  Call the [place] office at [phone] if you have any questions, problems or concerns.

## 2015-06-18 NOTE — Progress Notes (Signed)
CARDIOLOGY OFFICE NOTE  Date:  06/18/2015    Jimmy Myers Date of Birth: 08-22-1932 Medical Record #[id number]  PCP:  [name], MD  Cardiologist:  Allred    Chief Complaint  Patient presents with  . Palpitations  . Atrial Fibrillation    History of Present Illness: Jimmy Myers is a 81 y.o. male who presents today for a work in [place]. Seen for Dr. [name]. He has systolic heart failure with an EF down to 20 to 25% with improvement after medical therapy - up to 50% per echo in August of 2013 and was 45 to 50% by echo in July of 2014. His other issues include atrial fib with prior cardioversion, bradycardia, enlarged ascending aorta (followed by Dr. [name]), thyroid nodules (with benign biopsy), chronic thrombocytopenia and HTN. Had an event monitor in 2014 - average HR of 61. He has had issues with anemia and is followed at the [facility].   Seen by me was in September of 2015. Was doing ok. He still had some times where he got tired - but did not want to cut any medicines back - he liked to keep his heart rate low.   I last saw him in March - he was doing ok from our standpoint. Has been diagnosed since with lymphoma - on chemo - specifics unknown.  Being treated by the [place].   Phone call on Friday with complaints of elevated HR. Thus added to my schedule for today.   Comes back today. Here with his wife. Pretty argumentative today. Has had 7 treatments so far in regards to his lymphoma - he has Wegner's apparently. Notes that last Thursday night he was up voiding probably 5 or 6 times. Friday morning he noted an elevated HR - by the next day it was back down. He was maybe a little short of breath. He says that this "upset him". Has used extra diuretic today due to some weight gain. No chest pain. [facility]   Past Medical History  Diagnosis Date  . Systolic congestive heart failure with reduced left ventricular function, NYHA class 1 (HCC)     EF  is 20 to 25% per echo; felt to be due to reversible tachycardia induced CM  . A-fib [facility]) May 2013    s/p TEE/cardioversion June 2013  . Anxiety   . Pneumonia May 2013  . HTN (hypertension)   . Thrombocytopenia ([place])   . Chronic anticoagulation     on coumadin - checked in [place]  . Aortic root enlargement (HCC)     at least 5cm by CT  . Bronchitis   . Atrial thrombus ([place])   . Bradycardia   . Diverticulosis   . Hemorrhoids     Past Surgical History  Procedure Laterality Date  . Knee arthroscopy  rt knee  . Prostate surgery    . Tee without cardioversion  12/30/2011    Procedure: TRANSESOPHAGEAL ECHOCARDIOGRAM (TEE);  Surgeon: Lelon Perla, MD;  Location: [facility] ENDOSCOPY;  Service: Cardiovascular;  Laterality: N/A;  . Cardioversion  12/30/2011    Procedure: CARDIOVERSION;  Surgeon: Lelon Perla, MD;  Location: [facility] ENDOSCOPY;  Service: Cardiovascular;  Laterality: N/A;  . Esophagogastroduodenoscopy  12/30/2011    Procedure: ESOPHAGOGASTRODUODENOSCOPY (EGD);  Surgeon: Lelon Perla, MD;  Location: [facility] ENDOSCOPY;  Service: Cardiovascular;  Laterality: N/A;  . Esophagogastroduodenoscopy  12/30/2011    Procedure: ESOPHAGOGASTRODUODENOSCOPY (EGD);  Surgeon: Lafayette Dragon, MD;  Location: [facility] ENDOSCOPY;  Service: Endoscopy;  Laterality: N/A;  . Tee without cardioversion  02/16/2012    Procedure: TRANSESOPHAGEAL ECHOCARDIOGRAM (TEE);  Surgeon: Lelon Perla, MD;  Location: [facility] ENDOSCOPY;  Service: Cardiovascular;  Laterality: N/A;  . Cardioversion  02/16/2012    Procedure: CARDIOVERSION;  Surgeon: Lelon Perla, MD;  Location: [facility] ENDOSCOPY;  Service: Cardiovascular;  Laterality: N/A;     Medications: Current Outpatient Prescriptions  Medication Sig Dispense Refill  . albuterol (PROVENTIL HFA;VENTOLIN HFA) 108 (90 BASE) MCG/ACT inhaler Inhale 2 puffs into the lungs every 6 (six) hours as needed for wheezing or shortness of breath.    Marland Kitchen albuterol (PROVENTIL) (2.5 MG/3ML) 0.083%  nebulizer solution Take 2.5 mg by nebulization every 6 (six) hours as needed for wheezing.     . cetirizine (ZYRTEC) 10 MG tablet Take 10 mg by mouth daily.    . clorazepate (TRANXENE) 3.75 MG tablet Take 3.75 mg by mouth daily as needed. For anxiety    . fluticasone (FLONASE) 50 MCG/ACT nasal spray Place 1 spray into both nostrils daily as needed for allergies or rhinitis.    . fluticasone (FLONASE) 50 MCG/ACT nasal spray Place 1 spray into the nose as needed for allergies.     . furosemide (LASIX) 40 MG tablet Take 1 tablet (40 mg total) by mouth daily. 90 tablet 3  . lisinopril (PRINIVIL,ZESTRIL) 5 MG tablet TAKE ONE TABLET BY MOUTH ONCE DAILY 90 tablet 1  . metoprolol tartrate (LOPRESSOR) 25 MG tablet TAKE ONE TABLET BY MOUTH TWICE DAILY 180 tablet 0  . nitroGLYCERIN (NITROSTAT) 0.4 MG SL tablet Place 1 tablet (0.4 mg total) under the tongue every 5 (five) minutes x 3 doses as needed for chest pain. 25 tablet 6  . omeprazole (PRILOSEC) 20 MG capsule Take 20 mg by mouth daily.    . polyethylene glycol (MIRALAX / GLYCOLAX) packet Take 17 g by mouth daily.    . RiTUXimab (RITUXAN IV) Inject into the vein every 21 ( twenty-one) days.    Marland Kitchen warfarin (COUMADIN) 5 MG tablet Take as directed by coumadin clinic 120 tablet 1   No current facility-administered medications for this visit.    Allergies: Allergies  Allergen Reactions  . Codeine Other (See Comments)    nervousness  . Simvastatin Other (See Comments)    unknown  . Vicodin [Hydrocodone-Acetaminophen] Other (See Comments)    nervousness    Social History: The patient  reports that he has never smoked. He has never used smokeless tobacco. He reports that he does not drink alcohol or use illicit drugs.   Family History: The patient's family history includes Heart disease in his brother, father, mother, and sister; Hypertension in his sister; Stomach cancer in his brother.   Review of Systems: Please see the history of present  illness.   Otherwise, the review of systems is positive for easy bruising, vision issues, skipped heart beats and balance issues.   All other systems are reviewed and negative.   Physical Exam: VS:  BP 138/62 mmHg  Pulse 50  Ht 5\' 10"  (1.778 m)  Wt 208 lb 9.6 oz (94.62 kg)  BMI 29.93 kg/m2 .  BMI Body mass index is 29.93 kg/(m^2).  Wt Readings from Last 3 Encounters:  06/18/15 208 lb 9.6 oz (94.62 kg)  11/21/14 203 lb 6.4 oz (92.262 kg)  05/15/14 204 lb 12.8 oz (92.897 kg)    General: Alert. He is in no acute distress.  HEENT: Normal. Neck: Supple, no JVD, carotid  bruits, or masses noted.  Cardiac: Regular rate and rhythm. No murmurs, rubs, or gallops. No edema.  Respiratory:  Lungs are clear to auscultation bilaterally with normal work of breathing.  GI: Soft and nontender.  MS: No deformity or atrophy. Gait and ROM intact. Skin: Warm and dry. Color is normal.  Neuro:  Strength and sensation are intact and no gross focal deficits noted.  Psych: Alert, appropriate and with normal affect.   LABORATORY DATA:  EKG:  EKG is ordered today. This demonstrates sinus brady with nonspecific ST changes.  Lab Results  Component Value Date   WBC 5.2 11/08/2013   HGB 10.4* 11/08/2013   HCT 30.3* 11/08/2013   PLT 95.0* 11/08/2013   GLUCOSE 73 11/08/2013   CHOL 85 12/29/2011   TRIG 30 12/29/2011   HDL 40 12/29/2011   LDLCALC 39 12/29/2011   ALT 14 03/01/2013   AST 19 03/01/2013   NA 139 11/08/2013   K 4.3 11/08/2013   CL 103 11/08/2013   CREATININE 0.90 04/26/2014   BUN 24* 04/26/2014   CO2 28 11/08/2013   TSH 0.498 12/28/2011   INR 3.3 08/16/2013    BNP (last 3 results) No results for input(s): BNP in the last 8760 hours.  ProBNP (last 3 results) No results for input(s): PROBNP in the last 8760 hours.   Other Studies Reviewed Today:  Echo Study Conclusions from July 2014  - Left ventricle: The cavity size was mildly dilated. Wall thickness was normal. Systolic  function was mildly reduced. The estimated ejection fraction was in the range of 45% to 50%. Diffuse hypokinesis. Doppler parameters are consistent with abnormal left ventricular relaxation (grade 1 diastolic dysfunction). - Aortic valve: Mild regurgitation. - Aorta: Fusiform dilatation of the ascending aorta from the sinuses to the root The aorta was severely dilated. - Mitral valve: Mild regurgitation. - Left atrium: The atrium was mildly dilated.  Assessment / Plan:  1. PAF - in sinus by exam today and by EKG. He is stable from our standpoint. I suspect he had a spell of AF last week - I would favor continuing with our current regimen - he remains anticoagulated with coumadin. Will see back in 3 months  2. Chronic coumadin therapy - checked now at the [place] - no problems noted.   3. Systolic heart failure - last EF in July of 2014 was 45 to 50% - he does use extra diuretic based on weight gain   4. Enlarged aorta - followed by Dr. [name] - but cancelled his last visit due to his lymphoma and ongoing treatment. This was not discussed with him today  5. Iron deficiency anemia/thrombocytopenia [facility] - [facility] and now being treated for lymphoma  6. Chronic bradycardia - he has been very resistant in the past to cut his dose of beta blocker back. HR today is 50. He is not dizzy and has not had syncope. Would continue to monitor.  Current medicines are reviewed with the patient today.  The patient does not have concerns regarding medicines other than what has been noted above.  The following changes have been made:  See above.  Labs/ tests ordered today include:   No orders of the defined types were placed in this encounter.     Disposition:   FU with me in 3 months.  Patient is agreeable to this plan and will call if any problems develop in the interim.   Signed: Burtis Junes, RN,  ANP-C 06/18/2015 9:28 AM  [facility]  [facility] [address] [place], [place]  [address] Phone: [phone] Fax: [phone]

## 2015-07-09 ENCOUNTER — Other Ambulatory Visit: Payer: Self-pay | Admitting: Nurse Practitioner

## 2015-07-09 ENCOUNTER — Other Ambulatory Visit: Payer: Self-pay | Admitting: Internal Medicine

## 2015-07-24 ENCOUNTER — Ambulatory Visit: Payer: Medicare HMO | Admitting: Nurse Practitioner

## 2015-09-07 ENCOUNTER — Ambulatory Visit (INDEPENDENT_AMBULATORY_CARE_PROVIDER_SITE_OTHER): Payer: Medicare HMO | Admitting: Nurse Practitioner

## 2015-09-07 ENCOUNTER — Encounter: Payer: Self-pay | Admitting: Nurse Practitioner

## 2015-09-07 ENCOUNTER — Other Ambulatory Visit: Payer: Self-pay | Admitting: *Deleted

## 2015-09-07 VITALS — BP 116/58 | HR 52 | Ht 70.5 in | Wt 211.0 lb

## 2015-09-07 DIAGNOSIS — I351 Nonrheumatic aortic (valve) insufficiency: Secondary | ICD-10-CM

## 2015-09-07 DIAGNOSIS — I712 Thoracic aortic aneurysm, without rupture, unspecified: Secondary | ICD-10-CM

## 2015-09-07 NOTE — Patient Instructions (Addendum)
We will be checking the following labs today - NONE   Medication Instructions:    Continue with your current medicines.     Testing/Procedures To Be Arranged:  N/A  Follow-Up:   See me in 4 months with an echocardiogram on the same day.   I will call Dr. Everrett Coombe office to get you back on their schedule    Other Special Instructions:   N/A    If you need a refill on your cardiac medications before your next appointment, please call your pharmacy.   Call the [place] office at [phone] if you have any questions, problems or concerns.

## 2015-09-07 NOTE — Progress Notes (Signed)
CARDIOLOGY OFFICE NOTE  Date:  09/07/2015    Jimmy Myers Date of Birth: 01-Oct-1931 Medical Record [id number]  PCP:  [name], MD  Cardiologist:  Allred    Chief Complaint  Patient presents with  . Irregular Heart Beat    3 month check - seen for Dr. [name]  . Atrial Fibrillation  . Congestive Heart Failure    History of Present Illness: Jimmy Myers is a 82 y.o. male who presents today for a 3 month check. Seen for Dr. [name]. He has systolic heart failure with an EF previously down to 20 to 25% with improvement after medical therapy - up to 50% per echo in August of 2013 and was 45 to 50% by echo in July of 2014. His other issues include atrial fib with prior cardioversion, bradycardia, enlarged ascending aorta (followed by Dr. [name]), thyroid nodules (with benign biopsy), chronic thrombocytopenia and HTN. Had an event monitor in 2014 - average HR of 61. He has had issues with anemia and is followed at the [facility].   Seen by me was in September of 2015. Was doing ok. He still had some times where he got tired - but did not want to cut any medicines back - he liked to keep his heart rate low.   I last saw him in March - he was doing ok from our standpoint. Has been diagnosed since with lymphoma/Wegner's - on chemo - specifics unknown. Being treated by the [place].   I saw him back in October for a work in visit for concerns of an elevated HR. This was very short lived and we elected to just follow.  [facility]   Comes back today. Here with his wife. Remains pretty argumentative - this is chronic. Says he is doing ok. No more elevation in his heart rates. Not dizzy. Gaining weight and he is considering going to a rec center for some "exercise". No chest pain. Breathing is good. Says he has gotten a good report from the [place] regarding his lymphoma. Tolerating his medicines. Asking about getting back to see Dr. [name] as well.   Past Medical History    Diagnosis Date  . Systolic congestive heart failure with reduced left ventricular function, NYHA class 1 (HCC)     EF is 20 to 25% per echo; felt to be due to reversible tachycardia induced CM  . A-fib [facility]) May 2013    s/p TEE/cardioversion June 2013  . Anxiety   . Pneumonia May 2013  . HTN (hypertension)   . Thrombocytopenia ([place])   . Chronic anticoagulation     on coumadin - checked in [place]  . Aortic root enlargement (HCC)     at least 5cm by CT  . Bronchitis   . Atrial thrombus ([place])   . Bradycardia   . Diverticulosis   . Hemorrhoids     Past Surgical History  Procedure Laterality Date  . Knee arthroscopy  rt knee  . Prostate surgery    . Tee without cardioversion  12/30/2011    Procedure: TRANSESOPHAGEAL ECHOCARDIOGRAM (TEE);  Surgeon: Lelon Perla, MD;  Location: [facility] ENDOSCOPY;  Service: Cardiovascular;  Laterality: N/A;  . Cardioversion  12/30/2011    Procedure: CARDIOVERSION;  Surgeon: Lelon Perla, MD;  Location: [facility] ENDOSCOPY;  Service: Cardiovascular;  Laterality: N/A;  . Esophagogastroduodenoscopy  12/30/2011    Procedure: ESOPHAGOGASTRODUODENOSCOPY (EGD);  Surgeon: Lelon Perla, MD;  Location: [place];  Service: Cardiovascular;  Laterality: N/A;  . Esophagogastroduodenoscopy  12/30/2011    Procedure: ESOPHAGOGASTRODUODENOSCOPY (EGD);  Surgeon: Lafayette Dragon, MD;  Location: [facility] ENDOSCOPY;  Service: Endoscopy;  Laterality: N/A;  . Tee without cardioversion  02/16/2012    Procedure: TRANSESOPHAGEAL ECHOCARDIOGRAM (TEE);  Surgeon: Lelon Perla, MD;  Location: [facility] ENDOSCOPY;  Service: Cardiovascular;  Laterality: N/A;  . Cardioversion  02/16/2012    Procedure: CARDIOVERSION;  Surgeon: Lelon Perla, MD;  Location: [facility] ENDOSCOPY;  Service: Cardiovascular;  Laterality: N/A;     Medications: Current Outpatient Prescriptions  Medication Sig Dispense Refill  . albuterol (PROVENTIL HFA;VENTOLIN HFA) 108 (90 BASE) MCG/ACT inhaler Inhale 2 puffs into the lungs  every 6 (six) hours as needed for wheezing or shortness of breath.    . clorazepate (TRANXENE) 3.75 MG tablet Take 3.75 mg by mouth daily as needed. For anxiety    . fluticasone (FLONASE) 50 MCG/ACT nasal spray Place 1 spray into both nostrils daily as needed for allergies or rhinitis.    . furosemide (LASIX) 40 MG tablet TAKE ONE TABLET BY MOUTH ONCE DAILY 90 tablet 1  . lisinopril (PRINIVIL,ZESTRIL) 5 MG tablet TAKE ONE TABLET BY MOUTH ONCE DAILY 90 tablet 2  . metoprolol tartrate (LOPRESSOR) 25 MG tablet TAKE ONE TABLET BY MOUTH TWICE DAILY 180 tablet 2  . omeprazole (PRILOSEC) 20 MG capsule Take 20 mg by mouth daily.    . polyethylene glycol (MIRALAX / GLYCOLAX) packet Take 17 g by mouth daily.    . RiTUXimab (RITUXAN IV) Inject into the vein every 21 ( twenty-one) days.    Marland Kitchen warfarin (COUMADIN) 5 MG tablet Take as directed by coumadin clinic 120 tablet 1  . nitroGLYCERIN (NITROSTAT) 0.4 MG SL tablet Place 1 tablet (0.4 mg total) under the tongue every 5 (five) minutes x 3 doses as needed for chest pain. 25 tablet 6   No current facility-administered medications for this visit.    Allergies: Allergies  Allergen Reactions  . Codeine Other (See Comments)    nervousness  . Simvastatin Other (See Comments)    unknown  . Vicodin [Hydrocodone-Acetaminophen] Other (See Comments)    nervousness    Social History: The patient  reports that he has never smoked. He has never used smokeless tobacco. He reports that he does not drink alcohol or use illicit drugs.   Family History: The patient's family history includes Heart disease in his brother, father, mother, and sister; Hypertension in his sister; Stomach cancer in his brother.   Review of Systems: Please see the history of present illness.   Otherwise, the review of systems is positive for .   All other systems are reviewed and negative.   Physical Exam: VS:  BP 116/58 mmHg  Pulse 52  Ht 5' 10.5" (1.791 m)  Wt 211 lb (95.709 kg)   BMI 29.84 kg/m2 .  BMI Body mass index is 29.84 kg/(m^2).  Wt Readings from Last 3 Encounters:  09/07/15 211 lb (95.709 kg)  06/18/15 208 lb 9.6 oz (94.62 kg)  11/21/14 203 lb 6.4 oz (92.262 kg)    General: Pleasant. He is alert and in no acute distress. He is gaining weight.  HEENT: Normal. Neck: Supple, no JVD, carotid bruits, or masses noted.  Cardiac: Regular rate and rhythm. Outflow murmur noted. No edema.  Respiratory:  Lungs are clear to auscultation bilaterally with normal work of breathing.  GI: Soft and nontender.  MS: No deformity or atrophy. Gait and ROM intact. Skin: Warm and dry. Color is  normal.  Neuro:  Strength and sensation are intact and no gross focal deficits noted.  Psych: Alert, appropriate and with normal affect.   LABORATORY DATA:  EKG:  EKG is not ordered today.  Lab Results  Component Value Date   WBC 5.2 11/08/2013   HGB 10.4* 11/08/2013   HCT 30.3* 11/08/2013   PLT 95.0* 11/08/2013   GLUCOSE 73 11/08/2013   CHOL 85 12/29/2011   TRIG 30 12/29/2011   HDL 40 12/29/2011   LDLCALC 39 12/29/2011   ALT 14 03/01/2013   AST 19 03/01/2013   NA 139 11/08/2013   K 4.3 11/08/2013   CL 103 11/08/2013   CREATININE 0.90 04/26/2014   BUN 24* 04/26/2014   CO2 28 11/08/2013   TSH 0.498 12/28/2011   INR 3.3 08/16/2013    BNP (last 3 results) No results for input(s): BNP in the last 8760 hours.  ProBNP (last 3 results) No results for input(s): PROBNP in the last 8760 hours.   Other Studies Reviewed Today:  Echo Study Conclusions from July 2014  - Left ventricle: The cavity size was mildly dilated. Wall thickness was normal. Systolic function was mildly reduced. The estimated ejection fraction was in the range of 45% to 50%. Diffuse hypokinesis. Doppler parameters are consistent with abnormal left ventricular relaxation (grade 1 diastolic dysfunction). - Aortic valve: Mild regurgitation. - Aorta: Fusiform dilatation of the ascending aorta from  the sinuses to the root The aorta was severely dilated. - Mitral valve: Mild regurgitation. - Left atrium: The atrium was mildly dilated.  Assessment / Plan:  1. PAF - in sinus by exam today. He is stable from our standpoint.  I would favor continuing with our current regimen - he remains anticoagulated with coumadin. Will see back in 4 months  2. Chronic coumadin therapy - checked now at the [place] - no problems noted.   3. Systolic heart failure - last EF in July of 2014 was 45 to 50% - he does use extra diuretic based on weight gain - will get echo updated on return OV with me.   4. Enlarged aorta - followed by Dr. [name] - but cancelled his last visit due to his lymphoma and ongoing treatment. I have called their office and they will be in touch about rescheduling.   5. Iron deficiency anemia/thrombocytopenia - [facility] - getting [facility] transfusions and now being treated for lymphoma  6. Chronic bradycardia - he has been very resistant in the past to cut his dose of beta blocker back. HR today is stable. He is not dizzy and has not had syncope. Would continue to monitor.  Current medicines are reviewed with the patient today.  The patient does not have concerns regarding medicines other than what has been noted above.  The following changes have been made:  See above.  Labs/ tests ordered today include:    Orders Placed This Encounter  Procedures  . ECHOCARDIOGRAM COMPLETE     Disposition:   FU with me in 4 months with echo.   Patient is agreeable to this plan and will call if any problems develop in the interim.   Signed: Burtis Junes, RN, ANP-C 09/07/2015 9:50 AM  [facility] [address] [place], [place]  [address] Phone: [phone] Fax: [phone]

## 2015-09-17 ENCOUNTER — Ambulatory Visit (INDEPENDENT_AMBULATORY_CARE_PROVIDER_SITE_OTHER): Payer: Medicare HMO | Admitting: Cardiothoracic Surgery

## 2015-09-17 ENCOUNTER — Encounter: Payer: Self-pay | Admitting: Cardiothoracic Surgery

## 2015-09-17 ENCOUNTER — Ambulatory Visit
Admission: RE | Admit: 2015-09-17 | Discharge: 2015-09-17 | Disposition: A | Discharge status: Home / Self Care | Payer: Medicare HMO | Source: Ambulatory Visit | Attending: Cardiothoracic Surgery | Admitting: Cardiothoracic Surgery

## 2015-09-17 VITALS — BP 120/63 | HR 54 | Resp 20 | Ht 70.0 in | Wt 211.0 lb

## 2015-09-17 DIAGNOSIS — I7121 Aneurysm of the ascending aorta, without rupture: Secondary | ICD-10-CM

## 2015-09-17 DIAGNOSIS — I7789 Other specified disorders of arteries and arterioles: Secondary | ICD-10-CM | POA: Diagnosis not present

## 2015-09-17 DIAGNOSIS — I77810 Thoracic aortic ectasia: Secondary | ICD-10-CM

## 2015-09-17 DIAGNOSIS — I712 Thoracic aortic aneurysm, without rupture: Secondary | ICD-10-CM

## 2015-09-17 MED ORDER — IOPAMIDOL (ISOVUE-370) INJECTION 76%
75.0000 mL | Freq: Once | INTRAVENOUS | Status: AC | PRN
  Administered 2015-09-17: 75 mL via INTRAVENOUS

## 2015-09-17 NOTE — Progress Notes (Signed)
[address]       [place] [address]             [phone]                                                                Jimmy Myers Medical Record [id number] Date of Birth: [birth year]  Referring: Thompson Grayer, MD Primary Care: [name], MD  Chief Complaint:    Chief Complaint  Patient presents with  . Thoracic Aortic Aneurysm    8 month f/u with CTA Chest    History of Present Illness:    Patient is a 82 -year-old male who presented to cardiology in April of 2013 with increasing congestive heart failure, chronically elevated heart rate with atrial fibrillation in the 120s to 130s and left atrial clot. Since that time he's been started on Coumadin treated for heart failure, had a cardioversion and is maintaining sinus rhythm. His echo evidence of LV function has markedly improved.  In his evaluation he was incidentally noted to have a dilated ascending aorta.  He was seen 18 months ago with a CTA of the chest to evaluate his ascending aorta  . He has not had a recent cardiac catheterization.   He notes that he canceled his previously arranged appointment last year because of development of "lymphoma".  He has had issues with anemia and is followed at the [facility].    He was diagnosed since with lymphoma/Wegner's - on chemo - specifics unknown.He notes he received 10 cycles of cyclophosphamide and Rituxan. Since he notes his IgM is still elevated but otherwise has had a good report from the hematology at the [place].  He is currently followed by Dr. Rico Sheehan.   [facility]    Current Activity/ Functional Status: Patient is independent with mobility/ambulation, transfers, ADL's, IADL's.   Past Medical History  Diagnosis Date  . Systolic congestive heart failure with reduced left ventricular function, NYHA class 1 (HCC)     EF is 20 to 25% per echo; felt to be due to reversible tachycardia induced CM  .  A-fib [facility]) May 2013    s/p TEE/cardioversion June 2013  . Anxiety   . Pneumonia May 2013  . HTN (hypertension)   . Thrombocytopenia ([place])   . Chronic anticoagulation     on coumadin - checked in [place]  . Aortic root enlargement (HCC)     at least 5cm by CT  . Bronchitis   . Atrial thrombus ([place])   . Bradycardia   . Diverticulosis   . Hemorrhoids     Past Surgical History  Procedure Laterality Date  . Knee arthroscopy  rt knee  . Prostate surgery    . Tee without cardioversion  12/30/2011    Procedure: TRANSESOPHAGEAL ECHOCARDIOGRAM (TEE);  Surgeon: Lelon Perla, MD;  Location: [facility] ENDOSCOPY;  Service: Cardiovascular;  Laterality: N/A;  . Cardioversion  12/30/2011    Procedure: CARDIOVERSION;  Surgeon: Lelon Perla, MD;  Location: [facility] ENDOSCOPY;  Service: Cardiovascular;  Laterality: N/A;  . Esophagogastroduodenoscopy  12/30/2011    Procedure: ESOPHAGOGASTRODUODENOSCOPY (EGD);  Surgeon: Lelon Perla, MD;  Location: [facility] ENDOSCOPY;  Service: Cardiovascular;  Laterality: N/A;  . Esophagogastroduodenoscopy  12/30/2011    Procedure: ESOPHAGOGASTRODUODENOSCOPY (EGD);  Surgeon: Lafayette Dragon, MD;  Location: [facility] ENDOSCOPY;  Service: Endoscopy;  Laterality: N/A;  . Tee without cardioversion  02/16/2012    Procedure: TRANSESOPHAGEAL ECHOCARDIOGRAM (TEE);  Surgeon: Lelon Perla, MD;  Location: [facility] ENDOSCOPY;  Service: Cardiovascular;  Laterality: N/A;  . Cardioversion  02/16/2012    Procedure: CARDIOVERSION;  Surgeon: Lelon Perla, MD;  Location: [facility] ENDOSCOPY;  Service: Cardiovascular;  Laterality: N/A;    Family History  Problem Relation Age of Onset  . Cancer Brother     stomach  . Heart disease Mother 51  . Heart disease Father   . Heart disease Sister   . Heart disease Brother 89 died mi, and 59 died of mi  . Hypertension Sister       History  Smoking status  . Never Smoker   Smokeless tobacco  . Never Used    History  Alcohol Use No     Allergies    Allergen Reactions  . Codeine Other (See Comments)    nervousness  . Simvastatin Other (See Comments)    unknown  . Vicodin [Hydrocodone-Acetaminophen] Other (See Comments)    nervousness    Current Outpatient Prescriptions  Medication Sig Dispense Refill  . albuterol (PROVENTIL HFA;VENTOLIN HFA) 108 (90 BASE) MCG/ACT inhaler Inhale 2 puffs into the lungs every 6 (six) hours as needed for wheezing or shortness of breath.    . clorazepate (TRANXENE) 3.75 MG tablet Take 3.75 mg by mouth daily as needed. For anxiety    . fluticasone (FLONASE) 50 MCG/ACT nasal spray Place 1 spray into both nostrils daily as needed for allergies or rhinitis.    . folic acid (FOLVITE) [id number] MCG tablet Take 800 mcg by mouth daily.    . furosemide (LASIX) 40 MG tablet TAKE ONE TABLET BY MOUTH ONCE DAILY 90 tablet 1  . lisinopril (PRINIVIL,ZESTRIL) 5 MG tablet TAKE ONE TABLET BY MOUTH ONCE DAILY 90 tablet 2  . metoprolol tartrate (LOPRESSOR) 25 MG tablet TAKE ONE TABLET BY MOUTH TWICE DAILY 180 tablet 2  . omeprazole (PRILOSEC) 20 MG capsule Take 20 mg by mouth daily.    . polyethylene glycol (MIRALAX / GLYCOLAX) packet Take 17 g by mouth daily.    . potassium chloride (KLOR-CON) 20 MEQ packet Take 20 mEq by mouth 2 (two) times daily.    . RiTUXimab (RITUXAN IV) Inject into the vein every 21 ( twenty-one) days.    Marland Kitchen warfarin (COUMADIN) 5 MG tablet Take as directed by coumadin clinic 120 tablet 1  . nitroGLYCERIN (NITROSTAT) 0.4 MG SL tablet Place 1 tablet (0.4 mg total) under the tongue every 5 (five) minutes x 3 doses as needed for chest pain. 25 tablet 6   No current facility-administered medications for this visit.       Review of Systems:     Cardiac Review of Systems: Y or N  Chest Pain [ n   ]  Resting SOB [  n ] Exertional SOB  [better  ]  Orthopnea [ better ]   Pedal Edema [ y  ]    Palpitations Blue.Reese  ] Syncope  Florencio.Farrier  ]   Presyncope [ n  ]  General Review of Systems: [Y] = yes [  ]=no Constitional:  recent weight change Blue.Reese  ]; anorexia [  ]; fatigue [  ]; nausea [  ]; night sweats [ n ]; fever [ n ]; or chills [ n ];  Dental: poor dentition[y  ]; Last Dentist visit:couple years  Eye : blurred vision [  ]; diplopia [   ]; vision changes [  ];  Amaurosis fugax[  ]; Resp: cough [ n ];  wheezing[ n ];  hemoptysis[ n ]; shortness of breath[y  ]; paroxysmal nocturnal dyspnea[  ]; dyspnea on exertion[  ]; or orthopnea[  ];  GI:  gallstones[  ], vomiting[  ];  dysphagia[  ]; melena[  ];  hematochezia [  ]; heartburn[  ];   Hx of  Colonoscopy[  ]; GU: kidney stones [  ]; hematuria[  ];   dysuria [  ];  nocturia[  ];  history of     obstruction [  ];             Skin: rash, swelling[  ];, hair loss[  ];  peripheral edema[  ];  or itching[  ]; Musculosketetal: myalgias[  ];  joint swelling[  ];  joint erythema[  ];  joint pain[  ];  back pain[  ];  Heme/Lymph: bruising[  ];  bleeding[  ];  anemia[  ];  Neuro: TIA[ n ];  headaches[  ];  stroke[  ];  vertigo[  ];  seizures[  ];   paresthesias[  ];  difficulty walking[ n ];  Psych:depression[  ]; anxiety[  ];  Endocrine: diabetes[  ];  thyroid dysfunction[  ];  Immunizations: Flu Florencio.Farrier  ]; Pneumococcal[  n];  Other:  Physical Exam: BP 120/63 mmHg  Pulse 54  Resp 20  Ht 5\' 10"  (1.778 m)  Wt 211 lb (95.709 kg)  BMI 30.28 kg/m2  SpO2 97% Overall the patient looks better than on his previous visit to the office General appearance: alert, cooperative and no distress Neurologic: intact Heart: regular rate and rhythm, S1, S2 normal, 2/6  Murmur of AI, click, rub or gallop and normal apical impulse Lungs: clear to auscultation bilaterally and normal percussion bilaterally Abdomen: soft, non-tender; bowel sounds normal; no masses,  no organomegaly Extremities: extremities normal, atraumatic, no cyanosis or edema and Homans  sign is negative, no sign of DVT Patient has no carotid bruits, has no cervical supraclavicular adenopathy, has full DP PT radial brachial and femoral pulses Patient appears to be in sinus rhythm on exam,   He has a known thyroid mass 5 cm in size but I cannot appreciate this on physical exam   Diagnostic Studies & Laboratory data:     Recent Radiology Findings:  Ct Angio Chest Aorta W/cm &/or Wo/cm  09/17/2015  CLINICAL DATA:  Aortic root enlargement and ascending thoracic aortic aneurysm, presenting for follow-up. Status post chemotherapy for lymphoma in April 2016. EXAM: CT ANGIOGRAPHY CHEST WITH CONTRAST TECHNIQUE: Multidetector CT imaging of the chest was performed using the standard protocol during bolus administration of intravenous contrast. Multiplanar CT image reconstructions and MIPs were obtained to evaluate the vascular anatomy. CONTRAST:  75 cc Isovue 370 IV. COMPARISON:  04/26/2014 chest CT angiogram. FINDINGS: Mediastinum/Nodes: Stable mild cardiomegaly. No pericardial fluid/thickening. Left main, left anterior descending and right coronary atherosclerosis. There is motion artifact at the aortic root on today's scan. Using similar measurement technique, the aortic root dilatation measures 5.5 cm at the level of the sinuses of Valsalva, unchanged since 04/26/2014. There is a stable 4.5 cm ascending thoracic aortic aneurysm. The thoracic aorta measures 3.2 cm diameter at the level of the aortic isthmus, unchanged. The thoracic aorta measures 2.8 cm in diameter at the diaphragmatic hiatus, unchanged. Stable  mildly dilated main pulmonary artery (3.2 cm diameter). Stable 5.7 cm heterogeneously enhancing left thyroid lobe nodule. Normal esophagus. No pathologically enlarged axillary, mediastinal or hilar lymph nodes. Lungs/Pleura: No pneumothorax. No pleural effusion. Stable 4 mm pleural plaque associated with the minor fissure. Stable 3 mm left lower lobe solid pulmonary nodule (series 5/image  54). No acute consolidative airspace disease, new significant pulmonary nodules or lung masses. Upper abdomen: Simple exophytic 4.4 cm renal cyst in the anterior upper right kidney. Musculoskeletal: No aggressive appearing focal osseous lesions. Moderate degenerative changes in the thoracic spine. Review of the MIP images confirms the above findings. IMPRESSION: 1. Interval stability of aortic root dilatation, maximum diameter 5.5 cm at the level of the sinuses of Valsalva. 2. Interval stability of 4.5 cm ascending thoracic aortic aneurysm. Recommend semi-annual imaging followup by CTA or MRA. This recommendation follows 2010 ACCF/AHA/AATS/ACR/ASA/SCA/SCAI/SIR/STS/SVM Guidelines for the Diagnosis and Management of Patients With Thoracic Aortic Disease. Circulation. 2010; 121: [id number] 3. Stable mild main pulmonary artery dilation, suggesting chronic pulmonary arterial hypertension. 4. Stable mild cardiomegaly and coronary atherosclerosis. 5. Left lower lobe 3 mm pulmonary nodule, for which 16 month stability has been demonstrated, probably benign. 6. Stable 5.7 cm left thyroid lobe nodule. Electronically Signed   By: Ilona Sorrel M.D.   On: 09/17/2015 12:36    Ct Angio Chest Aorta W/cm &/or Wo/cm  03/24/2013   *RADIOLOGY REPORT*  Clinical Data: Known thoracic aortic aneurysm  CT ANGIOGRAPHY CHEST  Technique:  Multidetector CT imaging of the chest using the standard protocol during bolus administration of intravenous contrast. Multiplanar reconstructed images including MIPs were obtained and reviewed to evaluate the vascular anatomy.  Contrast: 44mL OMNIPAQUE IOHEXOL 350 MG/ML SOLN  Comparison: 01/13/2012  Findings: The lungs are well-aerated bilaterally.  Some minimal scarring is noted in the left lung base stable from prior exam.  No sizable parenchymal nodule is seen.  No focal infiltrate is noted.  There are changes in the thyroid gland with a large left thyroid nodule which is increased in the interval from  prior exam now measuring almost 5 cm.  It previously measured 3.8 cm.  Small right- sided thyroid nodules are noted as well.  There is prominence of the ascending aorta again identified.  When measured at a similar level to that on the prior exam the aorta and now measures 4.6 x 4.2 cm in greatest transverse and AP dimensions respectively.  It previously measured 4.2 x 4.0 cm in similar dimensions.  At the level of the coronary arteries the aortic root measures 5.8 cm this is best visualized on the coronal imaging. It previously measured 5.1 cm in this region.  The aorta tapers in a normal fashion after the origins of the great vessels.  A tricuspid aortic valve is noted.  Heavy coronary calcifications are noted in the left main coronary artery left anterior descending coronary artery.  Lesser calcifications are noted in the right coronary artery.  No significant hilar or mediastinal adenopathy is noted.  The visualized portions of the pulmonary artery are within normal limits.  Scanning into the upper abdomen reveals a right renal cyst which is stable from prior exam.  A few small dependent gallstones are now visualized.  Bony structures are within normal limits.  IMPRESSION: Dilatation of the aortic root and ascending aorta which has increased in the interval from prior exam as described above.  Enlarging thyroid nodules particularly on the left.   Original Report Authenticated By: Inez Catalina, M.D.  Ct Angio Chest Aortic Dissect W &/or W/o  08/16/2012  *RADIOLOGY REPORT*  Clinical Data: Enlarged aorta  CT ANGIOGRAPHY CHEST  Technique:  Multidetector CT imaging of the chest using the standard protocol during bolus administration of intravenous contrast. Multiplanar reconstructed images including MIPs were obtained and reviewed to evaluate the vascular anatomy.  Contrast: 137mL OMNIPAQUE IOHEXOL 350 MG/ML SOLN  Comparison: 01/13/2012  Findings: Dilatation of the aortic root is again noted.  Diameters of the  aorta at the sinus of Valsalva, sinotubular junction, and in the ascending aorta are 5.2, 4.5, and 4.6 cm respectively. Previously, the aortic root was 5.1 cm in diameter. Diameters at the aortic arch and descending aorta are 3.1 and 3.6 cm.  The innominate artery is tortuous and ectatic with maximal diameter of 1.6 cm.  Right subclavian and common carotid arteries are patent.  Mild calcified atherosclerotic change at the origin of the right vertebral artery.  Left common carotid and subclavian arteries are patent.  Atherosclerotic changes at the origin of the left vertebral artery are present making degree of narrowing at its origin difficult.  Coronary artery opacification is difficult due to motion artifact. Atherosclerotic changes at the right coronary artery branches, left main coronary artery, left anterior descending coronary artery are present.  Cardiomegaly is present.  The heart is dilated.  The left ventricle, in particular is significantly dilated without significant calcification of the aortic valve suggesting aortic valve insufficiency. Left atrium is not particularly dilated.  No obvious filling defects in the pulmonary arterial tree to suggest acute pulmonary thromboembolism.  The left lobe of the thyroid mass has further enlarged measuring 5.3 x 3.3  centimeters on image 9.  Negative abnormal mediastinal adenopathy.  Negative pericardial effusion.  No evidence of pulmonary parenchymal mass or consolidation.  No pneumothorax.  No pleural effusion  Calcified central disc herniation neck T9-10 causes an element of spinal stenosis.  Diffuse hepatic steatosis.  Tiny hypodensity in the left lobe of the liver is stable.  Stable benign appearing right renal cyst.  Small gallstone.  Atherosclerotic changes at the origin of the SMA without significant narrowing.  Severe narrowing in the proximal celiac.  IMPRESSION: Stable dilatation of the aortic root.  Diameter at the sinus of Valsalva is 5.2 cm.  There is  dilatation of the left ventricle without significant valvular calcifications suggesting aortic valve regurgitation.  Left thyroid mass has further enlarged.  Correlation with sonography is recommended.  Cholelithiasis.  Stable incidental findings.   Original Report Authenticated By: Marybelle Killings, M.D.    Recent Lab Findings: Lab Results  Component Value Date   WBC 5.2 11/08/2013   HGB 10.4* 11/08/2013   HCT 30.3* 11/08/2013   PLT 95.0* 11/08/2013   GLUCOSE 73 11/08/2013   CHOL 85 12/29/2011   TRIG 30 12/29/2011   HDL 40 12/29/2011   LDLCALC 39 12/29/2011   ALT 14 03/01/2013   AST 19 03/01/2013   NA 139 11/08/2013   K 4.3 11/08/2013   CL 103 11/08/2013   CREATININE 0.90 04/26/2014   BUN 24* 04/26/2014   CO2 28 11/08/2013   TSH 0.498 12/28/2011   INR 3.3 08/16/2013   Aortic Size Index=      5.5   /Body surface area is 2.17 meters squared. =2.53  < 2.75 cm/m2      4% risk per year 2.75 to 4.25          8% risk per year > 4.25 cm/m2    20% risk per  year   Assessment / Plan:   Aortic root enlargement  With out AI, at the sinus  5.2-5.5cm mid ascending aorta 4.5 cm, with some enlargement at root.- unchanged over the past 18 months Interval stability of aortic root dilatation, maximum diameter 5.5 cm at the level of the sinuses of Valsalva. 2. Interval stability of 4.5 cm ascending thoracic aortic aneurysm  History of Atrial fibrillation  Maintaining sinus rhythm  Continues  coumadin Chronic systolic heart failure with echo EF 40-45% hypertension  Multiple thyroid nodules unchanged Since last seen the patient seems to be walking slower, more for wide stance gait and needs his wife to keep his thoughts straight.    I have reviewed with the patient and his wife the current scan risks and options involved with elective replacement of his aortic root at age 59, especially in regard to his current hematologic disorder . The risk of  rupture and/or dissection have been discussed with  them.  Currently with age and medical condition continue observation  Return 6 months with follow up [place] MD  Beeper [phone] Office [phone] 09/17/2015 3:27 PM

## 2016-01-07 ENCOUNTER — Other Ambulatory Visit: Payer: Self-pay | Admitting: Nurse Practitioner

## 2016-01-11 ENCOUNTER — Encounter: Payer: Self-pay | Admitting: Nurse Practitioner

## 2016-01-11 ENCOUNTER — Ambulatory Visit (INDEPENDENT_AMBULATORY_CARE_PROVIDER_SITE_OTHER): Payer: Medicare HMO | Admitting: Nurse Practitioner

## 2016-01-11 ENCOUNTER — Other Ambulatory Visit: Payer: Self-pay

## 2016-01-11 ENCOUNTER — Ambulatory Visit (HOSPITAL_COMMUNITY): Payer: Medicare HMO | Attending: Cardiovascular Disease

## 2016-01-11 VITALS — BP 120/68 | HR 52 | Ht 70.0 in | Wt 203.8 lb

## 2016-01-11 DIAGNOSIS — I340 Nonrheumatic mitral (valve) insufficiency: Secondary | ICD-10-CM | POA: Diagnosis not present

## 2016-01-11 DIAGNOSIS — I351 Nonrheumatic aortic (valve) insufficiency: Secondary | ICD-10-CM

## 2016-01-11 DIAGNOSIS — Z7901 Long term (current) use of anticoagulants: Secondary | ICD-10-CM

## 2016-01-11 DIAGNOSIS — I110 Hypertensive heart disease with heart failure: Secondary | ICD-10-CM | POA: Diagnosis not present

## 2016-01-11 DIAGNOSIS — R001 Bradycardia, unspecified: Secondary | ICD-10-CM

## 2016-01-11 DIAGNOSIS — I480 Paroxysmal atrial fibrillation: Secondary | ICD-10-CM | POA: Diagnosis not present

## 2016-01-11 DIAGNOSIS — I509 Heart failure, unspecified: Secondary | ICD-10-CM | POA: Insufficient documentation

## 2016-01-11 DIAGNOSIS — I10 Essential (primary) hypertension: Secondary | ICD-10-CM

## 2016-01-11 DIAGNOSIS — I5022 Chronic systolic (congestive) heart failure: Secondary | ICD-10-CM

## 2016-01-11 NOTE — Patient Instructions (Addendum)
We will be checking the following labs today - NONE   Medication Instructions:    Continue with your current medicines.     Testing/Procedures To Be Arranged:  N/A  Follow-Up:   See me in 6 months    Other Special Instructions:   Bring [place] a copy of your labs at your next visit so I can get it scanned into your chart.   We will call you with your echo results    If you need a refill on your cardiac medications before your next appointment, please call your pharmacy.   Call the [place] office at [phone] if you have any questions, problems or concerns.

## 2016-01-11 NOTE — Progress Notes (Signed)
CARDIOLOGY OFFICE NOTE  Date:  01/11/2016    Jimmy Myers Date of Birth: December 14, 1931 Medical Record [id number]  PCP:  [name], MD  Cardiologist:  Allred    Chief Complaint  Patient presents with  . Atrial Fibrillation  . Congestive Heart Failure    Follow up visit - seen for Dr. [name]    History of Present Illness: Jimmy Myers is a 82 y.o. male who presents today for a follow up visit. Seen for Dr. [name].   He has systolic heart failure with an EF previously down to 20 to 25% with improvement after medical therapy - up to 50% per echo in August of 2013 and was 45 to 50% by echo in July of 2014. His other issues include atrial fib with prior cardioversion, bradycardia, enlarged ascending aorta (followed by Dr. [name]), thyroid nodules (with benign biopsy), chronic thrombocytopenia and HTN. Had an event monitor in 2014 - average HR of 61. 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   I have seen him several times over the past few years - he has done ok. He likes to keep his heart rate low and has been reluctant to change his medicines.   Last seen by me back in January and was doing ok.  Asked about getting back to see Dr. [name] as well since he had missed an appointment and we got him rescheduled with him.   Comes back today. Here with his wife. Had his echo updated this morning. He feels pretty good. No chest pain. Breathing is ok. He had the flu about 3 weeks ago - did lose some weight with that. Rhythm is good. Labs are checked by the [place] and he just had on Monday along with his coumadin check.   Past Medical History  Diagnosis Date  . Systolic congestive heart failure with reduced left ventricular function, NYHA class 1 (HCC)     EF is 20 to 25% per echo; felt to be due to reversible tachycardia induced CM  . A-fib [facility]) May 2013    s/p TEE/cardioversion June 2013  . Anxiety   .  Pneumonia May 2013  . HTN (hypertension)   . Thrombocytopenia ([place])   . Chronic anticoagulation     on coumadin - checked in [place]  . Aortic root enlargement (HCC)     at least 5cm by CT  . Bronchitis   . Atrial thrombus ([place])   . Bradycardia   . Diverticulosis   . Hemorrhoids     Past Surgical History  Procedure Laterality Date  . Knee arthroscopy  rt knee  . Prostate surgery    . Tee without cardioversion  12/30/2011    Procedure: TRANSESOPHAGEAL ECHOCARDIOGRAM (TEE);  Surgeon: Lelon Perla, MD;  Location: [facility] ENDOSCOPY;  Service: Cardiovascular;  Laterality: N/A;  . Cardioversion  12/30/2011    Procedure: CARDIOVERSION;  Surgeon: Lelon Perla, MD;  Location: [facility] ENDOSCOPY;  Service: Cardiovascular;  Laterality: N/A;  . Esophagogastroduodenoscopy  12/30/2011    Procedure: ESOPHAGOGASTRODUODENOSCOPY (EGD);  Surgeon: Lelon Perla, MD;  Location: [facility] ENDOSCOPY;  Service: Cardiovascular;  Laterality: N/A;  . Esophagogastroduodenoscopy  12/30/2011    Procedure: ESOPHAGOGASTRODUODENOSCOPY (EGD);  Surgeon: Lafayette Dragon, MD;  Location: [facility] ENDOSCOPY;  Service: Endoscopy;  Laterality: N/A;  . Tee without cardioversion  02/16/2012    Procedure: TRANSESOPHAGEAL ECHOCARDIOGRAM (TEE);  Surgeon: Lelon Perla, MD;  Location: [facility] ENDOSCOPY;  Service: Cardiovascular;  Laterality: N/A;  . Cardioversion  02/16/2012    Procedure: CARDIOVERSION;  Surgeon: Lelon Perla, MD;  Location: [facility] ENDOSCOPY;  Service: Cardiovascular;  Laterality: N/A;     Medications: Current Outpatient Prescriptions  Medication Sig Dispense Refill  . albuterol (PROVENTIL HFA;VENTOLIN HFA) 108 (90 BASE) MCG/ACT inhaler Inhale 2 puffs into the lungs every 6 (six) hours as needed for wheezing or shortness of breath.    . clorazepate (TRANXENE) 3.75 MG tablet Take 3.75 mg by mouth daily as needed. For anxiety    . fluticasone (FLONASE) 50 MCG/ACT nasal spray Place 1 spray into both nostrils daily as needed for  allergies or rhinitis.    . folic acid (FOLVITE) Q000111Q MCG tablet Take 800 mcg by mouth daily.    . furosemide (LASIX) 40 MG tablet TAKE ONE TABLET BY MOUTH ONCE DAILY 90 tablet 2  . lisinopril (PRINIVIL,ZESTRIL) 5 MG tablet TAKE ONE TABLET BY MOUTH ONCE DAILY 90 tablet 2  . metoprolol tartrate (LOPRESSOR) 25 MG tablet TAKE ONE TABLET BY MOUTH TWICE DAILY 180 tablet 2  . omeprazole (PRILOSEC) 20 MG capsule Take 20 mg by mouth daily.    . polyethylene glycol (MIRALAX / GLYCOLAX) packet Take 17 g by mouth daily.    . potassium chloride (KLOR-CON) 20 MEQ packet Take 20 mEq by mouth 2 (two) times daily.    . RiTUXimab (RITUXAN IV) Inject into the vein every 21 ( twenty-one) days.    Marland Kitchen warfarin (COUMADIN) 5 MG tablet Take as directed by coumadin clinic 120 tablet 1  . nitroGLYCERIN (NITROSTAT) 0.4 MG SL tablet Place 1 tablet (0.4 mg total) under the tongue every 5 (five) minutes x 3 doses as needed for chest pain. 25 tablet 6   No current facility-administered medications for this visit.    Allergies: Allergies  Allergen Reactions  . Codeine Other (See Comments)    nervousness  . Simvastatin Other (See Comments)    unknown  . Vicodin [Hydrocodone-Acetaminophen] Other (See Comments)    nervousness    Social History: The patient  reports that he has never smoked. He has never used smokeless tobacco. He reports that he does not drink alcohol or use illicit drugs.   Family History: The patient's family history includes Heart disease in his brother, father, mother, and sister; Hypertension in his sister; Stomach cancer in his brother.   Review of Systems: Please see the history of present illness.   Otherwise, the review of systems is positive for none.   All other systems are reviewed and negative.   Physical Exam: VS:  BP 120/68 mmHg  Pulse 52  Ht 5\' 10"  (1.778 m)  Wt 203 lb 12.8 oz (92.443 kg)  BMI 29.24 kg/m2 .  BMI Body mass index is 29.24 kg/(m^2).  Wt Readings from Last 3  Encounters:  01/11/16 203 lb 12.8 oz (92.443 kg)  09/17/15 211 lb (95.709 kg)  09/07/15 211 lb (95.709 kg)    General: Elderly. He is alert and in no acute distress. His weight is down 8 pounds.  HEENT: Normal. Neck: Supple, no JVD, carotid bruits, or masses noted.  Cardiac: Regular rate and rhythm. No murmurs, rubs, or gallops. No edema.  Respiratory:  Lungs are clear to auscultation bilaterally with normal work of breathing.  GI: Soft and nontender.  MS: No deformity or atrophy. Gait and ROM intact. Skin: Warm and dry. Color is normal.  Neuro:  Strength and sensation are intact and no  gross focal deficits noted.  Psych: Alert, appropriate and with normal affect.   LABORATORY DATA:  EKG:  EKG is not ordered today.  Lab Results  Component Value Date   WBC 5.2 11/08/2013   HGB 10.4* 11/08/2013   HCT 30.3* 11/08/2013   PLT 95.0* 11/08/2013   GLUCOSE 73 11/08/2013   CHOL 85 12/29/2011   TRIG 30 12/29/2011   HDL 40 12/29/2011   LDLCALC 39 12/29/2011   ALT 14 03/01/2013   AST 19 03/01/2013   NA 139 11/08/2013   K 4.3 11/08/2013   CL 103 11/08/2013   CREATININE 0.90 04/26/2014   BUN 24* 04/26/2014   CO2 28 11/08/2013   TSH 0.498 12/28/2011   INR 3.3 08/16/2013    BNP (last 3 results) No results for input(s): BNP in the last 8760 hours.  ProBNP (last 3 results) No results for input(s): PROBNP in the last 8760 hours.   Other Studies Reviewed Today:  Echo from today is pending  Echo Study Conclusions from July 2014  - Left ventricle: The cavity size was mildly dilated. Wall thickness was normal. Systolic function was mildly reduced. The estimated ejection fraction was in the range of 45% to 50%. Diffuse hypokinesis. Doppler parameters are consistent with abnormal left ventricular relaxation (grade 1 diastolic dysfunction). - Aortic valve: Mild regurgitation. - Aorta: Fusiform dilatation of the ascending aorta from the sinuses to the root The aorta was severely  dilated. - Mitral valve: Mild regurgitation. - Left atrium: The atrium was mildly dilated.  Assessment / Plan:  1. PAF - in sinus by exam today. He is stable from our standpoint. I would favor continuing with our current regimen - he remains anticoagulated with coumadin. Will see back in 6 months  2. Chronic coumadin therapy - checked now at the [place] - no problems noted.   3. Systolic heart failure - last EF in July of 2014 was 45 to 50% - he does use extra diuretic based on weight gain - updated echo results pending. Overall, I think he looks really good today.   4. Enlarged aorta - followed by Dr. [name] - for follow up study later this summer.   5. Iron deficiency anemia/thrombocytopenia [facility] and now being treated for lymphoma  6. Chronic bradycardia - he has been very resistant in the past to cut his dose of beta blocker back. HR today is stable. He is not dizzy and has not had syncope. Would continue to monitor.  Current medicines are reviewed with the patient today.  The patient does not have concerns regarding medicines other than what has been noted above.  The following changes have been made:  See above.  Labs/ tests ordered today include:   No orders of the defined types were placed in this encounter.     Disposition:   FU with me in 6 months.   Patient is agreeable to this plan and will call if any problems develop in the interim.   Signed: Burtis Junes, RN, ANP-C 01/11/2016 11:21 AM  [facility] [address] [place], [place]  [address] Phone: [phone] Fax: [phone]

## 2016-02-27 ENCOUNTER — Other Ambulatory Visit: Payer: Self-pay | Admitting: Cardiothoracic Surgery

## 2016-02-27 DIAGNOSIS — I7789 Other specified disorders of arteries and arterioles: Secondary | ICD-10-CM

## 2016-03-18 ENCOUNTER — Other Ambulatory Visit: Payer: Medicare HMO

## 2016-03-18 ENCOUNTER — Ambulatory Visit: Payer: Medicare HMO | Admitting: Cardiothoracic Surgery

## 2016-04-04 ENCOUNTER — Other Ambulatory Visit: Payer: Self-pay | Admitting: Internal Medicine

## 2016-05-06 ENCOUNTER — Encounter: Payer: Self-pay | Admitting: Nurse Practitioner

## 2016-05-07 ENCOUNTER — Ambulatory Visit (INDEPENDENT_AMBULATORY_CARE_PROVIDER_SITE_OTHER): Payer: Medicare HMO | Admitting: Nurse Practitioner

## 2016-05-07 ENCOUNTER — Encounter: Payer: Self-pay | Admitting: Nurse Practitioner

## 2016-05-07 VITALS — BP 144/66 | HR 60 | Ht 70.0 in | Wt 211.0 lb

## 2016-05-07 DIAGNOSIS — R001 Bradycardia, unspecified: Secondary | ICD-10-CM | POA: Diagnosis not present

## 2016-05-07 DIAGNOSIS — I480 Paroxysmal atrial fibrillation: Secondary | ICD-10-CM | POA: Diagnosis not present

## 2016-05-07 DIAGNOSIS — I10 Essential (primary) hypertension: Secondary | ICD-10-CM | POA: Diagnosis not present

## 2016-05-07 DIAGNOSIS — I5022 Chronic systolic (congestive) heart failure: Secondary | ICD-10-CM

## 2016-05-07 MED ORDER — METOPROLOL TARTRATE 25 MG PO TABS: 12.5000 mg | ORAL_TABLET | Freq: Two times a day (BID) | ORAL | 3 refills | Status: DC

## 2016-05-07 NOTE — Patient Instructions (Addendum)
We will be checking the following labs today - NONE   Medication Instructions:    Continue with your current medicines. BUT  I am cutting the Lopressor to just a half a pill twice a day (12.5 mg)    Testing/Procedures To Be Arranged:  N/A  Follow-Up:   See me in about 2 months as planned with repeat EKG.     Other Special Instructions:   N/A    If you need a refill on your cardiac medications before your next appointment, please call your pharmacy.   Call the [place] office at [phone] if you have any questions, problems or concerns.

## 2016-05-07 NOTE — Progress Notes (Signed)
CARDIOLOGY OFFICE NOTE  Date:  05/07/2016    [name] Date of Birth: December 17, 1931 Medical Record [id number]  PCP:  [name], MD  Cardiologist:  [name] & Allred    Chief Complaint  Patient presents with  . Atrial Fibrillation  . Congestive Heart Failure    Follow up visit - seen for Dr. [name]    History of Present Illness: Jimmy Myers is a 82 y.o. male who presents today for a work in visit. Seen for Dr. [name]. This is a 4 month check.   He has systolic heart failure with an EF previously down to 20 to 25% with improvement after medical therapy - up to 50% per echo in August of 2013 and was 45 to 50% by echo in July of 2014. His other issues include atrial fib with prior cardioversion, bradycardia, enlarged ascending aorta (followed by Dr. [name]), thyroid nodules (with benign biopsy), chronic thrombocytopenia and HTN. Had an event monitor in 2014 - average HR of 61. He has had issues with anemia and is followed at the [facility] - this has turned out to be Wegner's/lymphoma.   I have seen him several times over the past few years - he has done ok. He likes to keep his heart rate low and has been reluctant to change his medicines.   Last seen by me back in May and he was doing ok. Echo updated. Unchanged.   Comes back today. Here with his wife. Rambling today - keeps focusing his heart rate - says it has been down to 39. Then ok. Then in the 40's. Says only low in the mornings. BP has been ok. Does not sound like he is symptomatic. Has been reluctant to cut back Metoprolol in the past. Says his legs get too weak - not really able to elaborate. Has to sit and rest several times during the day. Tells me at the end of the visit - that his cataract has been postponed due to his HR and this this is why he is here. Labs brought in and were reviewed.    Past Medical History:  Diagnosis Date  . A-fib [facility]) May 2013   s/p TEE/cardioversion June 2013  . Anxiety     . Aortic root enlargement (HCC)    at least 5cm by CT  . Atrial thrombus ([place])   . Bradycardia   . Bronchitis   . Chronic anticoagulation    on coumadin - checked in [place]  . Diverticulosis   . Hemorrhoids   . HTN (hypertension)   . Pneumonia May 2013  . 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 Thrombocytopenia ([place])     Past Surgical History:  Procedure Laterality Date  . CARDIOVERSION  12/30/2011   Procedure: CARDIOVERSION;  Surgeon: Lelon Perla, MD;  Location: [facility] ENDOSCOPY;  Service: Cardiovascular;  Laterality: N/A;  . CARDIOVERSION  02/16/2012   Procedure: CARDIOVERSION;  Surgeon: Lelon Perla, MD;  Location: [facility] ENDOSCOPY;  Service: Cardiovascular;  Laterality: N/A;  . ESOPHAGOGASTRODUODENOSCOPY  12/30/2011   Procedure: ESOPHAGOGASTRODUODENOSCOPY (EGD);  Surgeon: Lelon Perla, MD;  Location: [facility] ENDOSCOPY;  Service: Cardiovascular;  Laterality: N/A;  . ESOPHAGOGASTRODUODENOSCOPY  12/30/2011   Procedure: ESOPHAGOGASTRODUODENOSCOPY (EGD);  Surgeon: Lafayette Dragon, MD;  Location: [facility] ENDOSCOPY;  Service: Endoscopy;  Laterality: N/A;  .  KNEE ARTHROSCOPY  rt knee  . PROSTATE SURGERY    . TEE WITHOUT CARDIOVERSION  12/30/2011   Procedure: TRANSESOPHAGEAL ECHOCARDIOGRAM (TEE);  Surgeon: Lelon Perla, MD;  Location: [facility] ENDOSCOPY;  Service: Cardiovascular;  Laterality: N/A;  . TEE WITHOUT CARDIOVERSION  02/16/2012   Procedure: TRANSESOPHAGEAL ECHOCARDIOGRAM (TEE);  Surgeon: Lelon Perla, MD;  Location: [facility] ENDOSCOPY;  Service: Cardiovascular;  Laterality: N/A;     Medications: Current Outpatient Prescriptions  Medication Sig Dispense Refill  . albuterol (PROVENTIL HFA;VENTOLIN HFA) 108 (90 BASE) MCG/ACT inhaler Inhale 2 puffs into the lungs every 6 (six) hours as needed for wheezing or shortness of breath.    . clorazepate (TRANXENE)  3.75 MG tablet Take 3.75 mg by mouth daily as needed. For anxiety    . fluticasone (FLONASE) 50 MCG/ACT nasal spray Place 1 spray into both nostrils daily as needed for allergies or rhinitis.    . folic acid (FOLVITE) Q000111Q MCG tablet Take 800 mcg by mouth daily.    . furosemide (LASIX) 40 MG tablet TAKE ONE TABLET BY MOUTH ONCE DAILY 90 tablet 2  . lisinopril (PRINIVIL,ZESTRIL) 5 MG tablet TAKE ONE TABLET BY MOUTH ONCE DAILY 90 tablet 0  . metoprolol tartrate (LOPRESSOR) 25 MG tablet Take 0.5 tablets (12.5 mg total) by mouth 2 (two) times daily. 90 tablet 3  . omeprazole (PRILOSEC) 20 MG capsule Take 20 mg by mouth daily.    . polyethylene glycol (MIRALAX / GLYCOLAX) packet Take 17 g by mouth daily.    . potassium chloride (KLOR-CON) 20 MEQ packet Take 20 mEq by mouth 2 (two) times daily.    Marland Kitchen warfarin (COUMADIN) 5 MG tablet Take as directed by coumadin clinic 120 tablet 1  . nitroGLYCERIN (NITROSTAT) 0.4 MG SL tablet Place 1 tablet (0.4 mg total) under the tongue every 5 (five) minutes x 3 doses as needed for chest pain. 25 tablet 6   No current facility-administered medications for this visit.     Allergies: Allergies  Allergen Reactions  . Codeine Other (See Comments)    nervousness  . Simvastatin Other (See Comments)    unknown  . Vicodin [Hydrocodone-Acetaminophen] Other (See Comments)    nervousness    Social History: The patient  reports that he has never smoked. He has never used smokeless tobacco. He reports that he does not drink alcohol or use drugs.   Family History: The patient's family history includes Heart disease in his brother, father, mother, and sister; Hypertension in his sister; Stomach cancer in his brother.   Review of Systems: Please see the history of present illness.   Otherwise, the review of systems is positive for none.   All other systems are reviewed and negative.   Physical Exam: VS:  BP (!) 144/66   Pulse 60   Ht 5\' 10"  (1.778 m)   Wt 211 lb  (95.7 kg)   SpO2 96% Comment: at rest  BMI 30.28 kg/m  .  BMI Body mass index is 30.28 kg/m.  Wt Readings from Last 3 Encounters:  05/07/16 211 lb (95.7 kg)  01/11/16 203 lb 12.8 oz (92.4 kg)  09/17/15 211 lb (95.7 kg)    General: Rambling at times. He is alert and in no acute distress.  He has gained 8 pounds.  HEENT: Normal.  Neck: Supple, no JVD, carotid bruits, or masses noted.  Cardiac: Regular rate and rhythm. HR about 52 by me. No edema.  Respiratory:  Lungs are clear to auscultation bilaterally with normal  work of breathing.  GI: Soft and nontender.  MS: No deformity or atrophy. Gait and ROM intact.  Skin: Warm and dry. Color is normal.  Neuro:  Strength and sensation are intact and no gross focal deficits noted.  Psych: Alert, appropriate and with normal affect.   LABORATORY DATA:  EKG:  EKG is ordered today. This shows marked bradycardia with 1st degree AV block - rate of 44.   Lab Results  Component Value Date   WBC 5.2 11/08/2013   HGB 10.4 (L) 11/08/2013   HCT 30.3 (L) 11/08/2013   PLT 95.0 (L) 11/08/2013   GLUCOSE 73 11/08/2013   CHOL 85 12/29/2011   TRIG 30 12/29/2011   HDL 40 12/29/2011   LDLCALC 39 12/29/2011   ALT 14 03/01/2013   AST 19 03/01/2013   NA 139 11/08/2013   K 4.3 11/08/2013   CL 103 11/08/2013   CREATININE 0.90 04/26/2014   BUN 24 (H) 04/26/2014   CO2 28 11/08/2013   TSH 0.498 12/28/2011   INR 3.3 08/16/2013    BNP (last 3 results) No results for input(s): BNP in the last 8760 hours.  ProBNP (last 3 results) No results for input(s): PROBNP in the last 8760 hours.   Other Studies Reviewed Today:  Echo Study Conclusions from 12/2015  - Left ventricle: The cavity size was mildly dilated. Systolic   function was mildly to moderately reduced. The estimated ejection   fraction was in the range of 40% to 45%. Diffuse hypokinesis.   Doppler parameters are consistent with abnormal left ventricular   relaxation (grade 1 diastolic  dysfunction). Doppler parameters   are consistent with elevated mean left atrial filling pressure. - Aortic valve: There was mild regurgitation. - Mitral valve: There was mild regurgitation. - Left atrium: The atrium was mildly dilated.  Assessment / Plan:  1. PAF - in sinus by exam today. Tends to be bradycardic. Will cut the Lopressor back to just 12.5 mg BID. He is asymptomatic the best I can tell.  I would favor otherwise continuing with our current regimen - he remains anticoagulated with coumadin. Will see back as planned in November with EKG.  2. Chronic coumadin therapy - checked now at the [place] - no problems noted.   3. Systolic heart failure - EF remains 45 to 50% - he does use extra diuretic based on weight gain.. Overall, I think he looks really good today.   4. Enlarged aorta - followed by Dr. [name] - was for follow up study later this summer but cancelled. He has decided that he will not have any further evaluation and there are no plans for surgical intervention.   5. Iron deficiency anemia/thrombocytopenia - [facility] - [facility] and now being treated for lymphoma  6. Chronic bradycardia - He is not dizzy and has not had syncope. Cutting Lopressor back today.   Current medicines are reviewed with the patient today.  The patient does not have concerns regarding medicines other than what has been noted above.  The following changes have been made:  See above.  Labs/ tests ordered today include:    Orders Placed This Encounter  Procedures  . EKG 12-Lead     Disposition:   FU with me as planned in November with repeat EKG.   Patient is agreeable to this plan and will call if any problems develop in the interim.   Signed: Burtis Junes, RN, ANP-C 05/07/2016 11:28 AM  [facility] [address] [place], [place]  [address] Phone: [phone] Fax: [phone]

## 2016-06-27 ENCOUNTER — Other Ambulatory Visit: Payer: Self-pay | Admitting: Nurse Practitioner

## 2016-07-01 ENCOUNTER — Encounter: Payer: Self-pay | Admitting: Nurse Practitioner

## 2016-07-16 ENCOUNTER — Encounter: Payer: Self-pay | Admitting: Nurse Practitioner

## 2016-07-16 ENCOUNTER — Ambulatory Visit (INDEPENDENT_AMBULATORY_CARE_PROVIDER_SITE_OTHER): Payer: Medicare HMO | Admitting: Nurse Practitioner

## 2016-07-16 VITALS — BP 126/64 | HR 46 | Ht 70.0 in | Wt 211.4 lb

## 2016-07-16 DIAGNOSIS — I5022 Chronic systolic (congestive) heart failure: Secondary | ICD-10-CM | POA: Diagnosis not present

## 2016-07-16 DIAGNOSIS — R001 Bradycardia, unspecified: Secondary | ICD-10-CM

## 2016-07-16 DIAGNOSIS — I480 Paroxysmal atrial fibrillation: Secondary | ICD-10-CM

## 2016-07-16 DIAGNOSIS — I10 Essential (primary) hypertension: Secondary | ICD-10-CM

## 2016-07-16 NOTE — Patient Instructions (Addendum)
We will be checking the following labs today - NONE   Medication Instructions:    Continue with your current medicines. BUT  I am stopping the Metoprolol - do not take anymore    Testing/Procedures To Be Arranged:  N/A  Follow-Up:   See me in 3 months    Other Special Instructions:   Keep a check on your heart rate for me - let me know if it stays above 90 consistently    If you need a refill on your cardiac medications before your next appointment, please call your pharmacy.   Call the [place] office at [phone] if you have any questions, problems or concerns.

## 2016-07-16 NOTE — Progress Notes (Signed)
CARDIOLOGY OFFICE NOTE  Date:  07/16/2016    Jimmy Myers Date of Birth: [birth year] Medical Record [id number]  PCP:  [name], MD  Cardiologist:  [name]    Chief Complaint  Patient presents with  . Atrial Fibrillation    Seen for Dr. [name]    History of Present Illness: Jimmy Myers is a 82 y.o. male who presents today for a 2 month check. Seen for Dr. [name].   He has systolic heart failure with an EF previously down to 20 to 25% with improvement after medical therapy - up to 50% per echo in August of 2013 and was 45 to 50% by echo in July of 2014. His other issues include atrial fib with prior cardioversion, bradycardia, enlarged ascending aorta (previously followed by Dr. [name]), thyroid nodules (with benign biopsy), chronic thrombocytopenia and HTN. Had an event monitor in 2014 - average HR of 61. 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 Coumadin is followed by the [place].  I have seen him several times over the past few years - he has done ok. He likes to keep his heart rate low and has been reluctant to change his medicines. Echo updated in May of 2017 and was unchanged. He has opted to not have any more imaging of his aorta.   Last seen by me back in September - HR low - I cut his Metoprolol back. He was otherwise doing ok.   Comes back today. Here with his wife. He's always a little on the grumpy side when he comes here. Says he is doing ok. Concerned about his weight. Staying active. Little lightheaded at times - little vague on this issue. Says he is concerned about his heart rate - still in the low 40's. No syncope. Having cataract surgery next week and hopefully again in December. No chest pain.   Past Medical History:  Diagnosis Date  . A-fib [facility]) May 2013   s/p TEE/cardioversion June 2013  . Anxiety   . Aortic root enlargement (HCC)    at least 5cm by CT  .  Atrial thrombus   . Bradycardia   . Bronchitis   . Chronic anticoagulation    on coumadin - checked in [place]  . Diverticulosis   . Hemorrhoids   . HTN (hypertension)   . Pneumonia May 2013  . 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 Thrombocytopenia ([place])     Past Surgical History:  Procedure Laterality Date  . CARDIOVERSION  12/30/2011   Procedure: CARDIOVERSION;  Surgeon: Lelon Perla, MD;  Location: [facility] ENDOSCOPY;  Service: Cardiovascular;  Laterality: N/A;  . CARDIOVERSION  02/16/2012   Procedure: CARDIOVERSION;  Surgeon: Lelon Perla, MD;  Location: [facility] ENDOSCOPY;  Service: Cardiovascular;  Laterality: N/A;  . ESOPHAGOGASTRODUODENOSCOPY  12/30/2011   Procedure: ESOPHAGOGASTRODUODENOSCOPY (EGD);  Surgeon: Lelon Perla, MD;  Location: [facility] ENDOSCOPY;  Service: Cardiovascular;  Laterality: N/A;  . ESOPHAGOGASTRODUODENOSCOPY  12/30/2011   Procedure: ESOPHAGOGASTRODUODENOSCOPY (EGD);  Surgeon: Lafayette Dragon, MD;  Location: [facility] ENDOSCOPY;  Service: Endoscopy;  Laterality: N/A;  . KNEE ARTHROSCOPY  rt knee  . PROSTATE SURGERY    . TEE WITHOUT CARDIOVERSION  12/30/2011   Procedure: TRANSESOPHAGEAL ECHOCARDIOGRAM (TEE);  Surgeon: Aaron Edelman  Jacalyn Lefevre, MD;  Location: [place] ENDOSCOPY;  Service: Cardiovascular;  Laterality: N/A;  . TEE WITHOUT CARDIOVERSION  02/16/2012   Procedure: TRANSESOPHAGEAL ECHOCARDIOGRAM (TEE);  Surgeon: Lelon Perla, MD;  Location: [facility] ENDOSCOPY;  Service: Cardiovascular;  Laterality: N/A;     Medications: Current Outpatient Prescriptions  Medication Sig Dispense Refill  . albuterol (PROVENTIL HFA;VENTOLIN HFA) 108 (90 BASE) MCG/ACT inhaler Inhale 2 puffs into the lungs every 6 (six) hours as needed for wheezing or shortness of breath.    . clorazepate (TRANXENE) 3.75 MG tablet Take 3.75 mg by mouth daily as needed. For anxiety      . fluticasone (FLONASE) 50 MCG/ACT nasal spray Place 1 spray into both nostrils daily as needed for allergies or rhinitis.    . folic acid (FOLVITE) [id number] MCG tablet Take 800 mcg by mouth daily.    . furosemide (LASIX) 40 MG tablet TAKE ONE TABLET BY MOUTH ONCE DAILY 90 tablet 2  . lisinopril (PRINIVIL,ZESTRIL) 5 MG tablet TAKE ONE TABLET BY MOUTH ONCE DAILY 90 tablet 2  . metoprolol tartrate (LOPRESSOR) 25 MG tablet Take 0.5 tablets (12.5 mg total) by mouth 2 (two) times daily. 90 tablet 3  . omeprazole (PRILOSEC) 20 MG capsule Take 20 mg by mouth daily.    . polyethylene glycol (MIRALAX / GLYCOLAX) packet Take 17 g by mouth daily.    . potassium chloride (KLOR-CON) 20 MEQ packet Take 20 mEq by mouth 2 (two) times daily.    Marland Kitchen warfarin (COUMADIN) 5 MG tablet Take as directed by coumadin clinic 120 tablet 1  . nitroGLYCERIN (NITROSTAT) 0.4 MG SL tablet Place 1 tablet (0.4 mg total) under the tongue every 5 (five) minutes x 3 doses as needed for chest pain. 25 tablet 6   No current facility-administered medications for this visit.     Allergies: Allergies  Allergen Reactions  . Codeine Other (See Comments)    nervousness  . Simvastatin Other (See Comments)    unknown  . Vicodin [Hydrocodone-Acetaminophen] Other (See Comments)    nervousness    Social History: The patient  reports that he has never smoked. He has never used smokeless tobacco. He reports that he does not drink alcohol or use drugs.   Family History: The patient's family history includes Heart disease in his brother, father, mother, and sister; Hypertension in his sister; Stomach cancer in his brother.   Review of Systems: Please see the history of present illness.   Otherwise, the review of systems is positive for none.   All other systems are reviewed and negative.   Physical Exam: VS:  BP 126/64   Pulse (!) 46   Ht 5\' 10"  (1.778 m)   Wt 211 lb 6.4 oz (95.9 kg)   BMI 30.33 kg/m  .  BMI Body mass index is 30.33  kg/m.  Wt Readings from Last 3 Encounters:  07/16/16 211 lb 6.4 oz (95.9 kg)  05/07/16 211 lb (95.7 kg)  01/11/16 203 lb 12.8 oz (92.4 kg)    General: Pleasant. Well developed, well nourished and in no acute distress.   HEENT: Normal.  Neck: Supple, no JVD, carotid bruits, or masses noted.  Cardiac: Regular rate and rhythm but slow - 44 by my count. No murmurs, rubs, or gallops. No edema.  Respiratory:  Lungs are clear to auscultation bilaterally with normal work of breathing.  GI: Soft and nontender.  MS: No deformity or atrophy. Gait and ROM intact.  Skin: Warm and dry. Color is normal.  Neuro:  Strength and sensation are intact and no gross focal deficits noted.  Psych: Alert, appropriate and with normal affect.   LABORATORY DATA:  EKG:  EKG is ordered today. This demonstrates sinus brady - rate of 46.  Lab Results  Component Value Date   WBC 5.2 11/08/2013   HGB 10.4 (L) 11/08/2013   HCT 30.3 (L) 11/08/2013   PLT 95.0 (L) 11/08/2013   GLUCOSE 73 11/08/2013   CHOL 85 12/29/2011   TRIG 30 12/29/2011   HDL 40 12/29/2011   LDLCALC 39 12/29/2011   ALT 14 03/01/2013   AST 19 03/01/2013   NA 139 11/08/2013   K 4.3 11/08/2013   CL 103 11/08/2013   CREATININE 0.90 04/26/2014   BUN 24 (H) 04/26/2014   CO2 28 11/08/2013   TSH 0.498 12/28/2011   INR 3.3 08/16/2013    BNP (last 3 results) No results for input(s): BNP in the last 8760 hours.  ProBNP (last 3 results) No results for input(s): PROBNP in the last 8760 hours.   Other Studies Reviewed Today:   Echo Study Conclusions from 12/2015  - Left ventricle: The cavity size was mildly dilated. Systolic function was mildly to moderately reduced. The estimated ejection fraction was in the range of 40% to 45%. Diffuse hypokinesis. Doppler parameters are consistent with abnormal left ventricular relaxation (grade 1 diastolic dysfunction). Doppler parameters are consistent with elevated mean left atrial  filling pressure. - Aortic valve: There was mild regurgitation. - Mitral valve: There was mild regurgitation. - Left atrium: The atrium was mildly dilated.  Assessment / Plan: 1. PAF - in sinus by exam today. He remains anticoagulated with coumadin.   2. Bradycardia - stopping his Lopressor altogether today. I suspect he has some degree of sick sinus syndrome. Hopefully he will remain in sinus. No indication for PPM at this time.   3. Chronic coumadin therapy - checked now at the [place] - no problems noted.   4. Systolic heart failure - EF remains 45 to 50% - he does use extra diuretic based on weight gain. Overall, I think he looks really good today.   5. Enlarged aorta - followed by Dr. [name] - he has decided that he will not have any further evaluation and there are no plans for surgical intervention.   6. Iron deficiency anemia/thrombocytopenia [facility] and now being treated for lymphoma  Current medicines are reviewed with the patient today.  The patient does not have concerns regarding medicines other than what has been noted above.  The following changes have been made:  See above.  Labs/ tests ordered today include:   No orders of the defined types were placed in this encounter.    Disposition:   FU with me in 4 months.    Patient is agreeable to this plan and will call if any problems develop in the interim.   Signed: Burtis Junes, RN, ANP-C 07/16/2016 11:14 AM  [facility] [address] [place], [place]  [address] Phone: [phone] Fax: [phone]

## 2016-07-21 NOTE — Patient Instructions (Signed)
Your procedure is scheduled on: 07/28/2016  Report to [facility] at   910  AM.  Call this number if you have problems the morning of surgery: [phone]   Do not eat food or drink liquids :After Midnight.      Take these medicines the morning of surgery with A SIP OF WATER: lisinopril, prilosec.   Do not wear jewelry, make-up or nail polish.  Do not wear lotions, powders, or perfumes. You may wear deodorant.  Do not shave 48 hours prior to surgery.  Do not bring valuables to the hospital.  Contacts, dentures or bridgework may not be worn into surgery.  Leave suitcase in the car. After surgery it may be brought to your room.  For patients admitted to the hospital, checkout time is 11:00 AM the day of discharge.   Patients discharged the day of surgery will not be allowed to drive home.  :     Please read over the following fact sheets that you were given: Coughing and Deep Breathing, Surgical Site Infection Prevention, Anesthesia Post-op Instructions and Care and Recovery After Surgery    Cataract A cataract is a clouding of the lens of the eye. When a lens becomes cloudy, vision is reduced based on the degree and nature of the clouding. Many cataracts reduce vision to some degree. Some cataracts make people more near-sighted as they develop. Other cataracts increase glare. Cataracts that are ignored and become worse can sometimes look white. The white color can be seen through the pupil. CAUSES   Aging. However, cataracts may occur at any age, even in newborns.   Certain drugs.   Trauma to the eye.   Certain diseases such as diabetes.   Specific eye diseases such as chronic inflammation inside the eye or a sudden attack of a rare form of glaucoma.   Inherited or acquired medical problems.  SYMPTOMS   Gradual, progressive drop in vision in the affected eye.   Severe, rapid visual loss. This most often happens when trauma is the cause.  DIAGNOSIS  To detect a cataract, an eye  doctor examines the lens. Cataracts are best diagnosed with an exam of the eyes with the pupils enlarged (dilated) by drops.  TREATMENT  For an early cataract, vision may improve by using different eyeglasses or stronger lighting. If that does not help your vision, surgery is the only effective treatment. A cataract needs to be surgically removed when vision loss interferes with your everyday activities, such as driving, reading, or watching TV. A cataract may also have to be removed if it prevents examination or treatment of another eye problem. Surgery removes the cloudy lens and usually replaces it with a substitute lens (intraocular lens, IOL).  At a time when both you and your doctor agree, the cataract will be surgically removed. If you have cataracts in both eyes, only one is usually removed at a time. This allows the operated eye to heal and be out of danger from any possible problems after surgery (such as infection or poor wound healing). In rare cases, a cataract may be doing damage to your eye. In these cases, your caregiver may advise surgical removal right away. The vast majority of people who have cataract surgery have better vision afterward. HOME CARE INSTRUCTIONS  If you are not planning surgery, you may be asked to do the following:  Use different eyeglasses.   Use stronger or brighter lighting.   Ask your eye doctor about reducing your medicine  dose or changing medicines if it is thought that a medicine caused your cataract. Changing medicines does not make the cataract go away on its own.   Become familiar with your surroundings. Poor vision can lead to injury. Avoid bumping into things on the affected side. You are at a higher risk for tripping or falling.   Exercise extreme care when driving or operating machinery.   Wear sunglasses if you are sensitive to bright light or experiencing problems with glare.  SEEK IMMEDIATE MEDICAL CARE IF:   You have a worsening or sudden  vision loss.   You notice redness, swelling, or increasing pain in the eye.   You have a fever.  Document Released: 08/18/2005 Document Revised: 08/07/2011 Document Reviewed: 04/11/2011 [facility] Patient Information 2012 [place].PATIENT INSTRUCTIONS POST-ANESTHESIA  IMMEDIATELY FOLLOWING SURGERY:  Do not drive or operate machinery for the first twenty four hours after surgery.  Do not make any important decisions for twenty four hours after surgery or while taking narcotic pain medications or sedatives.  If you develop intractable nausea and vomiting or a severe headache please notify your doctor immediately.  FOLLOW-UP:  Please make an appointment with your surgeon as instructed. You do not need to follow up with anesthesia unless specifically instructed to do so.  WOUND CARE INSTRUCTIONS (if applicable):  Keep a dry clean dressing on the anesthesia/puncture wound site if there is drainage.  Once the wound has quit draining you may leave it open to air.  Generally you should leave the bandage intact for twenty four hours unless there is drainage.  If the epidural site drains for more than 36-48 hours please call the anesthesia department.  QUESTIONS?:  Please feel free to call your physician or the hospital operator if you have any questions, and they will be happy to assist you.

## 2016-07-22 ENCOUNTER — Encounter (HOSPITAL_COMMUNITY)
Admission: RE | Admit: 2016-07-22 | Discharge: 2016-07-22 | Disposition: A | Discharge status: Home / Self Care | Payer: Non-veteran care | Source: Ambulatory Visit | Attending: Ophthalmology | Admitting: Ophthalmology

## 2016-07-22 ENCOUNTER — Encounter (HOSPITAL_COMMUNITY): Payer: Self-pay

## 2016-07-22 DIAGNOSIS — Z01812 Encounter for preprocedural laboratory examination: Secondary | ICD-10-CM | POA: Diagnosis not present

## 2016-07-22 HISTORY — DX: Cardiac arrhythmia, unspecified: I49.9

## 2016-07-22 HISTORY — DX: Unspecified osteoarthritis, unspecified site: M19.90

## 2016-07-22 HISTORY — DX: Gastro-esophageal reflux disease without esophagitis: K21.9

## 2016-07-22 LAB — CBC WITH DIFFERENTIAL/PLATELET
BASOS PCT: 0 %
Basophils Absolute: 0 10*3/uL (ref 0.0–0.1)
EOS ABS: 0.1 10*3/uL (ref 0.0–0.7)
Eosinophils Relative: 2 %
HCT: 41.1 % (ref 39.0–52.0)
HEMOGLOBIN: 13.4 g/dL (ref 13.0–17.0)
Lymphocytes Relative: 32 %
Lymphs Abs: 1.4 10*3/uL (ref 0.7–4.0)
MCH: 32.8 pg (ref 26.0–34.0)
MCHC: 32.6 g/dL (ref 30.0–36.0)
MCV: 100.7 fL — ABNORMAL HIGH (ref 78.0–100.0)
MONOS PCT: 11 %
Monocytes Absolute: 0.5 10*3/uL (ref 0.1–1.0)
NEUTROS PCT: 55 %
Neutro Abs: 2.5 10*3/uL (ref 1.7–7.7)
Platelets: 119 10*3/uL — ABNORMAL LOW (ref 150–400)
RBC: 4.08 MIL/uL — ABNORMAL LOW (ref 4.22–5.81)
RDW: 12.6 % (ref 11.5–15.5)
WBC: 4.5 10*3/uL (ref 4.0–10.5)

## 2016-07-22 LAB — BASIC METABOLIC PANEL
Anion gap: 7 (ref 5–15)
BUN: 20 mg/dL (ref 6–20)
CALCIUM: 8.7 mg/dL — AB (ref 8.9–10.3)
CHLORIDE: 103 mmol/L (ref 101–111)
CO2: 28 mmol/L (ref 22–32)
CREATININE: 1.03 mg/dL (ref 0.61–1.24)
Glucose, Bld: 107 mg/dL — ABNORMAL HIGH (ref 65–99)
Potassium: 4.2 mmol/L (ref 3.5–5.1)
SODIUM: 138 mmol/L (ref 135–145)

## 2016-07-28 ENCOUNTER — Encounter (HOSPITAL_COMMUNITY)
Admission: RE | Disposition: A | Discharge status: Home / Self Care | Payer: Self-pay | Source: Ambulatory Visit | Attending: Ophthalmology

## 2016-07-28 ENCOUNTER — Encounter (HOSPITAL_COMMUNITY): Payer: Self-pay | Admitting: *Deleted

## 2016-07-28 ENCOUNTER — Ambulatory Visit (HOSPITAL_COMMUNITY): Payer: No Typology Code available for payment source | Admitting: Anesthesiology

## 2016-07-28 ENCOUNTER — Ambulatory Visit (HOSPITAL_COMMUNITY)
Admission: RE | Admit: 2016-07-28 | Discharge: 2016-07-28 | Disposition: A | Discharge status: Home / Self Care | Payer: No Typology Code available for payment source | Source: Ambulatory Visit | Attending: Ophthalmology | Admitting: Ophthalmology

## 2016-07-28 DIAGNOSIS — Z79899 Other long term (current) drug therapy: Secondary | ICD-10-CM | POA: Insufficient documentation

## 2016-07-28 DIAGNOSIS — I4891 Unspecified atrial fibrillation: Secondary | ICD-10-CM | POA: Insufficient documentation

## 2016-07-28 DIAGNOSIS — H2512 Age-related nuclear cataract, left eye: Secondary | ICD-10-CM | POA: Insufficient documentation

## 2016-07-28 DIAGNOSIS — F419 Anxiety disorder, unspecified: Secondary | ICD-10-CM | POA: Diagnosis not present

## 2016-07-28 DIAGNOSIS — I110 Hypertensive heart disease with heart failure: Secondary | ICD-10-CM | POA: Insufficient documentation

## 2016-07-28 DIAGNOSIS — I509 Heart failure, unspecified: Secondary | ICD-10-CM | POA: Diagnosis not present

## 2016-07-28 DIAGNOSIS — G4730 Sleep apnea, unspecified: Secondary | ICD-10-CM | POA: Insufficient documentation

## 2016-07-28 DIAGNOSIS — H2181 Floppy iris syndrome: Secondary | ICD-10-CM | POA: Diagnosis not present

## 2016-07-28 HISTORY — PX: CATARACT EXTRACTION W/PHACO: SHX586

## 2016-07-28 SURGERY — PHACOEMULSIFICATION, CATARACT, WITH IOL INSERTION
Anesthesia: Monitor Anesthesia Care | Site: Eye | Laterality: Left

## 2016-07-28 MED ORDER — LIDOCAINE HCL 3.5 % OP GEL
1.0000 "application " | Freq: Once | OPHTHALMIC | Status: AC
  Administered 2016-07-28: 1 via OPHTHALMIC

## 2016-07-28 MED ORDER — FENTANYL CITRATE (PF) 100 MCG/2ML IJ SOLN
25.0000 ug | INTRAMUSCULAR | Status: DC | PRN
  Administered 2016-07-28: 25 ug via INTRAVENOUS

## 2016-07-28 MED ORDER — BSS IO SOLN
INTRAOCULAR | Status: DC | PRN
  Administered 2016-07-28: 15 mL via INTRAOCULAR

## 2016-07-28 MED ORDER — FENTANYL CITRATE (PF) 100 MCG/2ML IJ SOLN
INTRAMUSCULAR | Status: AC
  Filled 2016-07-28: qty 2

## 2016-07-28 MED ORDER — PROVISC 10 MG/ML IO SOLN
INTRAOCULAR | Status: DC | PRN
  Administered 2016-07-28: 0.85 mL via INTRAOCULAR

## 2016-07-28 MED ORDER — MIDAZOLAM HCL 2 MG/2ML IJ SOLN
INTRAMUSCULAR | Status: AC
  Filled 2016-07-28: qty 2

## 2016-07-28 MED ORDER — BSS IO SOLN
INTRAOCULAR | Status: DC | PRN
  Administered 2016-07-28: 500 mL

## 2016-07-28 MED ORDER — MIDAZOLAM HCL 2 MG/2ML IJ SOLN
1.0000 mg | INTRAMUSCULAR | Status: DC | PRN
  Administered 2016-07-28: 2 mg via INTRAVENOUS

## 2016-07-28 MED ORDER — LACTATED RINGERS IV SOLN
INTRAVENOUS | Status: DC
  Administered 2016-07-28: 1000 mL via INTRAVENOUS

## 2016-07-28 MED ORDER — NEOMYCIN-POLYMYXIN-DEXAMETH 3.5-10000-0.1 OP SUSP
OPHTHALMIC | Status: DC | PRN
  Administered 2016-07-28: 2 [drp] via OPHTHALMIC

## 2016-07-28 MED ORDER — PHENYLEPHRINE HCL 2.5 % OP SOLN
1.0000 [drp] | OPHTHALMIC | Status: AC
Start: 2016-07-28 — End: 2016-07-28
  Administered 2016-07-28 (×3): 1 [drp] via OPHTHALMIC

## 2016-07-28 MED ORDER — EPINEPHRINE PF 1 MG/ML IJ SOLN
INTRAMUSCULAR | Status: AC
  Filled 2016-07-28: qty 1

## 2016-07-28 MED ORDER — TETRACAINE HCL 0.5 % OP SOLN
1.0000 [drp] | OPHTHALMIC | Status: AC
  Administered 2016-07-28 (×3): 1 [drp] via OPHTHALMIC

## 2016-07-28 MED ORDER — POVIDONE-IODINE 5 % OP SOLN
OPHTHALMIC | Status: DC | PRN
  Administered 2016-07-28: 1 via OPHTHALMIC

## 2016-07-28 MED ORDER — CYCLOPENTOLATE-PHENYLEPHRINE 0.2-1 % OP SOLN
1.0000 [drp] | OPHTHALMIC | Status: AC
  Administered 2016-07-28 (×3): 1 [drp] via OPHTHALMIC

## 2016-07-28 MED ORDER — LIDOCAINE 3.5 % OP GEL OPTIME - NO CHARGE
OPHTHALMIC | Status: DC | PRN
Start: 2016-07-28 — End: 2016-07-28
  Administered 2016-07-28: 2 [drp] via OPHTHALMIC

## 2016-07-28 MED ORDER — LIDOCAINE HCL (PF) 1 % IJ SOLN
INTRAOCULAR | Status: DC | PRN
  Administered 2016-07-28: 1 mL via OPHTHALMIC

## 2016-07-28 SURGICAL SUPPLY — 23 items
CAPSULAR TENSION RING-AMO (OPHTHALMIC RELATED) IMPLANT
CLOTH BEACON ORANGE TIMEOUT ST (SAFETY) ×2 IMPLANT
EYE SHIELD UNIVERSAL CLEAR (GAUZE/BANDAGES/DRESSINGS) ×2 IMPLANT
GLOVE BIOGEL PI IND STRL 6.5 (GLOVE) IMPLANT
GLOVE BIOGEL PI IND STRL 7.0 (GLOVE) IMPLANT
GLOVE BIOGEL PI INDICATOR 6.5 (GLOVE) ×2
GLOVE BIOGEL PI INDICATOR 7.0 (GLOVE)
GLOVE EXAM NITRILE LRG STRL (GLOVE) IMPLANT
GLOVE EXAM NITRILE MD LF STRL (GLOVE) ×2 IMPLANT
KIT VITRECTOMY (OPHTHALMIC RELATED) IMPLANT
PAD ARMBOARD 7.5X6 YLW CONV (MISCELLANEOUS) ×2 IMPLANT
PROC W NO LENS (INTRAOCULAR LENS)
PROC W SPEC LENS (INTRAOCULAR LENS)
PROCESS W NO LENS (INTRAOCULAR LENS) IMPLANT
PROCESS W SPEC LENS (INTRAOCULAR LENS) IMPLANT
RETRACTOR IRIS SIGHTPATH (OPHTHALMIC RELATED) IMPLANT
RING MALYGIN (MISCELLANEOUS) ×2 IMPLANT
SIGHTPATH CAT PROC W REG LENS (Ophthalmic Related) ×3 IMPLANT
SYRINGE LUER LOK 1CC (MISCELLANEOUS) ×2 IMPLANT
TAPE SURG TRANSPORE 1 IN (GAUZE/BANDAGES/DRESSINGS) IMPLANT
TAPE SURGICAL TRANSPORE 1 IN (GAUZE/BANDAGES/DRESSINGS) ×2
VISCOELASTIC ADDITIONAL (OPHTHALMIC RELATED) IMPLANT
WATER STERILE IRR 250ML POUR (IV SOLUTION) ×2 IMPLANT

## 2016-07-28 NOTE — H&P (Signed)
I have reviewed the H&P, the patient was re-examined, and I have identified no interval changes in medical condition and plan of care since the history and physical of record  

## 2016-07-28 NOTE — Discharge Instructions (Signed)

## 2016-07-28 NOTE — Anesthesia Postprocedure Evaluation (Signed)
  Anesthesia Post-op Note  Patient: Jimmy Myers  Procedure(s) Performed: Procedure(s) (LRB): CATARACT EXTRACTION PHACO AND INTRAOCULAR LENS PLACEMENT LEFT EYE:  CDE: 13.00 (Left)  Patient Location:  Short Stay  Anesthesia Type: MAC  Level of Consciousness: awake  Airway and Oxygen Therapy: Patient Spontanous Breathing  Post-op Pain: none  Post-op Assessment: Post-op Vital signs reviewed, Patient's Cardiovascular Status Stable, Respiratory Function Stable, Patent Airway, No signs of Nausea or vomiting and Pain level controlled  Post-op Vital Signs: Reviewed and stable  Complications: No apparent anesthesia complications Anesthesia Post Note  Patient: Jimmy Myers  Procedure(s) Performed: Procedure(s) (LRB): CATARACT EXTRACTION PHACO AND INTRAOCULAR LENS PLACEMENT LEFT EYE:  CDE: 13.00 (Left)  Anesthesia Post Evaluation  Last Vitals:  Vitals:   07/28/16 1100  Resp: 13    Last Pain: There were no vitals filed for this visit.               Drucie Opitz

## 2016-07-28 NOTE — Transfer of Care (Signed)
Immediate Anesthesia Transfer of Care Note  Patient: Jimmy Myers  Procedure(s) Performed: Procedure(s) (LRB): CATARACT EXTRACTION PHACO AND INTRAOCULAR LENS PLACEMENT LEFT EYE:  CDE: 13.00 (Left)  Patient Location: Shortstay  Anesthesia Type: MAC  Level of Consciousness: awake  Airway & Oxygen Therapy: Patient Spontanous Breathing   Post-op Assessment: Report given to PACU RN, Post -op Vital signs reviewed and stable and Patient moving all extremities  Post vital signs: Reviewed and stable  Complications: No apparent anesthesia complications

## 2016-07-28 NOTE — Op Note (Signed)
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  Assistants: None  Anesthesia: Topical with MAC  Indications: Painless, progressive loss of vision with compromise of daily activities.  Surgery: Cataract Extraction with Intraocular lens Implant Left Eye, CPT Code [phone]  Discription: The patient had dilating drops and viscous lidocaine placed into the left eye in the pre-op holding area. After transfer to the operating room, a time out was performed. The patient was then prepped and draped. Beginning with a 54 degree blade a paracentesis port was made at the surgeon's 2 o'clock position. The anterior chamber was then filled with 1% non-preserved lidocaine with epinepherine. This was followed by filling the anterior chamber with Provisc. A Malyugan ring was placed into the anterior chamber using its injector. The loops were positioned with the Kuglan hook. A bent cystatome needle was used to create a continuous tear capsulotomy. Hydrodissection was performed with balanced salt solution on a Fine canula. The lens nucleus was then removed using the phacoemulsification handpiece. Residual cortex was removed with the I&A handpiece. The anterior chamber and capsular bag were refilled with Provisc. A posterior chamber intraocular lens was placed into the capsular bag with it's injector. The implant was positioned with the Kuglan hook. The Malyugan ring was disengaged from the iris margin and removed with its injector. The Provisc was then removed from the anterior chamber and capsular bag with the I&A handpiece. Stromal hydration of the main incision and paracentesis port was performed with BSS on a Fine canula. The wounds were tested for leak which was negative. The patient tolerated the procedure well. There were no  operative complications. The patient was then transferred to the recovery room in stable condition.  Complications: None  Specimen: None  EBL: None  Prosthetic device: Abbott Technis, PCB00, power 21.5, SN [id number].

## 2016-07-28 NOTE — Anesthesia Preprocedure Evaluation (Signed)
Anesthesia Evaluation  Patient identified by MRN, date of birth, ID band Patient awake    Reviewed: Allergy & Precautions, H&P , NPO status , Patient's Chart, lab work & pertinent test results  Airway Mallampati: II   Neck ROM: full    Dental  (+) Teeth Intact, Missing   Pulmonary neg pulmonary ROS,    breath sounds clear to auscultation       Cardiovascular hypertension, Pt. on medications +CHF  + dysrhythmias Atrial Fibrillation  Rhythm:Regular Rate:Bradycardia     Neuro/Psych Anxiety    GI/Hepatic GERD  ,  Endo/Other    Renal/GU      Musculoskeletal   Abdominal   Peds  Hematology   Anesthesia Other Findings   Reproductive/Obstetrics                             Anesthesia Physical Anesthesia Plan  ASA: III  Anesthesia Plan: MAC   Post-op Pain Management:    Induction: Intravenous  Airway Management Planned: Nasal Cannula  Additional Equipment:   Intra-op Plan:   Post-operative Plan:   Informed Consent: I have reviewed the patients History and Physical, chart, labs and discussed the procedure including the risks, benefits and alternatives for the proposed anesthesia with the patient or authorized representative who has indicated his/her understanding and acceptance.     Plan Discussed with:   Anesthesia Plan Comments:         Anesthesia Quick Evaluation

## 2016-07-31 ENCOUNTER — Encounter (HOSPITAL_COMMUNITY): Payer: Self-pay | Admitting: Ophthalmology

## 2016-09-08 ENCOUNTER — Inpatient Hospital Stay (HOSPITAL_COMMUNITY): Admission: RE | Admit: 2016-09-08 | Payer: Medicare HMO | Source: Ambulatory Visit

## 2016-09-17 ENCOUNTER — Encounter (HOSPITAL_COMMUNITY): Payer: Self-pay

## 2016-09-17 ENCOUNTER — Encounter (HOSPITAL_COMMUNITY)
Admission: RE | Admit: 2016-09-17 | Discharge: 2016-09-17 | Disposition: A | Discharge status: Home / Self Care | Payer: No Typology Code available for payment source | Source: Ambulatory Visit | Attending: Ophthalmology | Admitting: Ophthalmology

## 2016-09-22 ENCOUNTER — Encounter (HOSPITAL_COMMUNITY): Admission: RE | Payer: Self-pay | Source: Ambulatory Visit

## 2016-09-22 ENCOUNTER — Ambulatory Visit (HOSPITAL_COMMUNITY)
Admission: RE | Admit: 2016-09-22 | Payer: No Typology Code available for payment source | Source: Ambulatory Visit | Admitting: Ophthalmology

## 2016-09-22 SURGERY — PHACOEMULSIFICATION, CATARACT, WITH IOL INSERTION
Anesthesia: Monitor Anesthesia Care | Site: Eye | Laterality: Right

## 2016-09-25 ENCOUNTER — Other Ambulatory Visit: Payer: Self-pay | Admitting: Nurse Practitioner

## 2016-10-03 ENCOUNTER — Encounter (INDEPENDENT_AMBULATORY_CARE_PROVIDER_SITE_OTHER): Payer: Self-pay

## 2016-10-03 ENCOUNTER — Encounter: Payer: Self-pay | Admitting: Family Medicine

## 2016-10-03 ENCOUNTER — Ambulatory Visit (INDEPENDENT_AMBULATORY_CARE_PROVIDER_SITE_OTHER): Payer: PPO | Admitting: Family Medicine

## 2016-10-03 VITALS — BP 96/54 | HR 89 | Temp 96.9°F | Ht 70.0 in | Wt 205.8 lb

## 2016-10-03 DIAGNOSIS — R195 Other fecal abnormalities: Secondary | ICD-10-CM | POA: Diagnosis not present

## 2016-10-03 DIAGNOSIS — C8800 Waldenstrom macroglobulinemia not having achieved remission: Secondary | ICD-10-CM

## 2016-10-03 DIAGNOSIS — I5022 Chronic systolic (congestive) heart failure: Secondary | ICD-10-CM | POA: Diagnosis not present

## 2016-10-03 DIAGNOSIS — I480 Paroxysmal atrial fibrillation: Secondary | ICD-10-CM

## 2016-10-03 DIAGNOSIS — R61 Generalized hyperhidrosis: Secondary | ICD-10-CM | POA: Diagnosis not present

## 2016-10-03 MED ORDER — ONDANSETRON HCL 4 MG PO TABS: 4.0000 mg | ORAL_TABLET | Freq: Three times a day (TID) | ORAL | 0 refills | Status: DC | PRN

## 2016-10-03 NOTE — Progress Notes (Signed)
   HPI  Patient presents today to establish care with diarrhea and night sweats.  Patient explains that he has a history of lymphoma treated with radiation and chemotherapy at the [place]. He has had night sweats for many months and they seem to be getting worse. (Waldenstrom's Macroglobulinemia) He is breathing normally.  He's had loose stools for 1-2 weeks. He has held off on his usual miralax to last night when he took a dose.  He states he doesn't feel well today, however he does not have any discrete symptoms. Dates that overall his loose stools and nausea are getting better. He requests a nausea medicine, he has one at home but states it interacts with Coumadin.  He complains of bilateral itchy arms.   PMH: Him flex past medical history with atrial fibrillation, anxiety, aortic root enlargement, Waldenstrm's macroglobulinemia systolic CHF, diverticulosis, GERD, hypertension, thrombocytopenia Past surgical history significant for cardioversion 2 in 2013, left cataract extraction, EGD on the knee arthroscopy, prostate surgery, TEE 2 ROS: Per HPI  Objective: BP (!) 96/54   Pulse 89   Temp (!) 96.9 F (36.1 C) (Oral)   Ht 5\' 10"  (1.778 m)   Wt 205 lb 12.8 oz (93.4 kg)   BMI 29.53 kg/m  Gen: NAD, alert, cooperative with exam HEENT: NCAT, oropharynx moist and clear, CV: Irregularly irregular, no murmur appreciated Resp: CTABL, no wheezes, non-labored Abd: SNTND, BS present, no guarding or organomegaly Ext: No edema, warm Neuro: Alert and oriented, No gross deficits Skin: Solar lentigo, no rash on arms  Assessment and plan:  # Chronic systolic CHF Appears euvolemic, normal respirations today No changes to medications, patient is managed by cardiology. Continue ACE inhibitor, Lasix Last EF 45%.  # Atrial fibrillation, paroxysmal Rate controlled today, irregularly irregular Previously on beta blockers, these were discontinued Recommended scheduling an appointment for  INR  # Waldenstrm's macroglobulinemia, night sweats Most likely night sweats related to macroglobulinemia Patient is followed by the [facility], recommended follow-up with them Blood work was just performed one month ago patient reports platelets 158, hemoglobin 13.9  # Loose stools Patient feels that it's improving, he has taken miralax 5 loose stools, recommended stopping for 1 week He appears well, however his blood pressure is softer than usual Very low threshold for follow-up Small amount of Zofran given  Hypertension Well-controlled, Lasix and lisinopril Soft BP today, no changes considering likely due to loose stools again low threshold for follow-up.     Meds ordered this encounter  Medications  . ondansetron (ZOFRAN) 4 MG tablet    Sig: Take 1 tablet (4 mg total) by mouth every 8 (eight) hours as needed for nausea or vomiting.    Dispense:  10 tablet    Refill:  0    Laroy Apple, MD [place] 10/03/2016, 10:27 AM

## 2016-10-03 NOTE — Patient Instructions (Signed)
Great to meet you!  Try Eucerin or another another unscented lotion on your arms immediately after you get out of the shower or bath ( while your skin is still damp)  Make an appointment to get your INR checked around the 20th of this month  Stop miralax for 1 week.   Come back in 3 month sunless you need [place] sooner

## 2016-10-07 ENCOUNTER — Telehealth: Payer: Self-pay | Admitting: Internal Medicine

## 2016-10-07 NOTE — Telephone Encounter (Signed)
New Message   Per pt experiencing some congestion, and due to having heart problems wants to speak with the nurse about what medications he can take. Requesting a call back from nurse. Asked pt if he reached out to primary physician first, and he stated that he was there Friday, and thinks he caught cold from there. Pt stated he reached out to heart care because he didn't want medication that would increase heart rate.

## 2016-10-07 NOTE — Telephone Encounter (Signed)
Spoke with patient and let him know to take plain Robitussin  He will try and he is going to call his PCP as he is due for cataract surgery on Mon

## 2016-10-08 ENCOUNTER — Encounter (HOSPITAL_COMMUNITY)
Admission: RE | Admit: 2016-10-08 | Discharge: 2016-10-08 | Disposition: A | Discharge status: Home / Self Care | Payer: PPO | Source: Ambulatory Visit | Attending: Ophthalmology | Admitting: Ophthalmology

## 2016-10-08 ENCOUNTER — Encounter (HOSPITAL_COMMUNITY): Payer: Self-pay

## 2016-10-09 ENCOUNTER — Ambulatory Visit (INDEPENDENT_AMBULATORY_CARE_PROVIDER_SITE_OTHER): Payer: PPO | Admitting: Family Medicine

## 2016-10-09 ENCOUNTER — Encounter: Payer: Self-pay | Admitting: Family Medicine

## 2016-10-09 VITALS — BP 96/58 | HR 94 | Temp 97.2°F | Ht 70.0 in | Wt 204.2 lb

## 2016-10-09 DIAGNOSIS — J111 Influenza due to unidentified influenza virus with other respiratory manifestations: Secondary | ICD-10-CM

## 2016-10-09 DIAGNOSIS — R29898 Other symptoms and signs involving the musculoskeletal system: Secondary | ICD-10-CM | POA: Diagnosis not present

## 2016-10-09 DIAGNOSIS — R05 Cough: Secondary | ICD-10-CM | POA: Diagnosis not present

## 2016-10-09 DIAGNOSIS — R059 Cough, unspecified: Secondary | ICD-10-CM | POA: Insufficient documentation

## 2016-10-09 LAB — VERITOR FLU A/B WAIVED
Influenza A: POSITIVE — AB
Influenza B: NEGATIVE

## 2016-10-09 MED ORDER — CEFDINIR 300 MG PO CAPS: 300.0000 mg | ORAL_CAPSULE | Freq: Two times a day (BID) | ORAL | 0 refills | Status: DC

## 2016-10-09 MED ORDER — OSELTAMIVIR PHOSPHATE 75 MG PO CAPS: 75.0000 mg | ORAL_CAPSULE | Freq: Two times a day (BID) | ORAL | 0 refills | Status: DC

## 2016-10-09 NOTE — Patient Instructions (Addendum)
Great to see you!  You may take ttylenol 1-2 pills 3 times daily as needed  Be sure to finish all antibiotics and all of the tamiflu.   Come back with any concerns  Try Mucinex DM 12 hour for cough  DO not take lasix today, you can restart tomorrow if you are tolerating fluids.    Influenza, Adult Influenza, more commonly known as "the flu," is a viral infection that primarily affects the respiratory tract. The respiratory tract includes organs that help you breathe, such as the lungs, nose, and throat. The flu causes many common cold symptoms, as well as a high fever and body aches. The flu spreads easily from person to person (is contagious). Getting a flu shot (influenza vaccination) every year is the best way to prevent influenza. What are the causes? Influenza is caused by a virus. You can catch the virus by:  Breathing in droplets from an infected person's cough or sneeze.  Touching something that was recently contaminated with the virus and then touching your mouth, nose, or eyes. What increases the risk? The following factors may make you more likely to get the flu:  Not cleaning your hands frequently with soap and water or alcohol-based hand sanitizer.  Having close contact with many people during cold and flu season.  Touching your mouth, eyes, or nose without washing or sanitizing your hands first.  Not drinking enough fluids or not eating a healthy diet.  Not getting enough sleep or exercise.  Being under a high amount of stress.  Not getting a yearly (annual) flu shot. You may be at a higher risk of complications from the flu, such as a severe lung infection (pneumonia), if you:  Are over the age of 77.  Are pregnant.  Have a weakened disease-fighting system (immune system). You may have a weakened immune system if you:  Have HIV or AIDS.  Are undergoing chemotherapy.  Aretaking medicines that reduce the activity of (suppress) the immune system.  Have  a long-term (chronic) illness, such as heart disease, kidney disease, diabetes, or lung disease.  Have a liver disorder.  Are obese.  Have anemia. What are the signs or symptoms? Symptoms of this condition typically last 4-10 days and may include:  Fever.  Chills.  Headache, body aches, or muscle aches.  Sore throat.  Cough.  Runny or congested nose.  Chest discomfort and cough.  Poor appetite.  Weakness or tiredness (fatigue).  Dizziness.  Nausea or vomiting. How is this diagnosed? This condition may be diagnosed based on your medical history and a physical exam. Your health care provider may do a nose or throat swab test to confirm the diagnosis. How is this treated? If influenza is detected early, you can be treated with antiviral medicine that can reduce the length of your illness and the severity of your symptoms. This medicine may be given by mouth (orally) or through an IV tube that is inserted in one of your veins. The goal of treatment is to relieve symptoms by taking care of yourself at home. This may include taking over-the-counter medicines, drinking plenty of fluids, and adding humidity to the air in your home. In some cases, influenza goes away on its own. Severe influenza or complications from influenza may be treated in a hospital. Follow these instructions at home:  Take over-the-counter and prescription medicines only as told by your health care provider.  Use a cool mist humidifier to add humidity to the air in your home.  This can make breathing easier.  Rest as needed.  Drink enough fluid to keep your urine clear or pale yellow.  Cover your mouth and nose when you cough or sneeze.  Wash your hands with soap and water often, especially after you cough or sneeze. If soap and water are not available, use hand sanitizer.  Stay home from work or school as told by your health care provider. Unless you are visiting your health care provider, try to  avoid leaving home until your fever has been gone for 24 hours without the use of medicine.  Keep all follow-up visits as told by your health care provider. This is important. How is this prevented?  Getting an annual flu shot is the best way to avoid getting the flu. You may get the flu shot in late summer, fall, or winter. Ask your health care provider when you should get your flu shot.  Wash your hands often or use hand sanitizer often.  Avoid contact with people who are sick during cold and flu season.  Eat a healthy diet, drink plenty of fluids, get enough sleep, and exercise regularly. Contact a health care provider if:  You develop new symptoms.  You have:  Chest pain.  Diarrhea.  A fever.  Your cough gets worse.  You produce more mucus.  You feel nauseous or you vomit. Get help right away if:  You develop shortness of breath or difficulty breathing.  Your skin or nails turn a bluish color.  You have severe pain or stiffness in your neck.  You develop a sudden headache or sudden pain in your face or ear.  You cannot stop vomiting. This information is not intended to replace advice given to you by your health care provider. Make sure you discuss any questions you have with your health care provider. Document Released: 08/15/2000 Document Revised: 01/24/2016 Document Reviewed: 06/12/2015 Elsevier Interactive Patient Education  2017 [employer].

## 2016-10-09 NOTE — Progress Notes (Signed)
   HPI  Patient presents today here with acute illness.  Patient states that for the last 2 weeks or so he's had intermittent loose stools. These have actually improved since the onset of the other symptoms.  About 6 days ago, the day after he left our clinic, he began having bilateral leg weakness, body aches, subjective fever, malaise.   He also complains of cough, this is been going on for 2-3 weeks.  Patient has been taking his fluid pills like usual with good urine output. He is tolerating fluids like usual.  He states that he had 3 falls on Monday, he did not hit his head, he did not lose consciousness, and he states that his pain after the falls is not unreasonable. He does not complain of any significant pain after the fall.  He denies any loss of consciousness. Patient states that he had significant leg weakness when he tried to get out of bed which caused the first fall.  Patient is scheduled for eye surgery on Monday   PMH: Smoking status noted ROS: Per HPI  Objective: BP (!) 96/58   Pulse 94   Temp 97.2 F (36.2 C) (Oral)   Ht 5\' 10"  (1.778 m)   Wt 204 lb 3.2 oz (92.6 kg)   BMI 29.30 kg/m  Gen: NAD, alert, cooperative with exam HEENT: NCAT CV: RRR, good S1/S2, no murmur Resp: Nonlabored, good air movement, left lower lung field with decreased air movement and decreased lung sounds with some rhonchorous sounds Ext: Trace pitting edema, compression stockings in place Neuro: Alert and oriented, strength 4/5 and symmetric in bilateral lower extremities  Assessment and plan:  # Influenza Coarse breath sounds, added Omnicef to cover possible underlying or developing pneumonia Treat with Tamiflu given age and comorbidities Recommend holding Lasix 1 day then restarted tolerating fluids Tylenol usage discussed Usual course of illness yes Recommended deferring eye surgery that is scheduled for 3 days from now Also discussed very low threshold for seeking emergent  medical care if patient should have any increased work of breathing, difficulty tolerating foods or fluids, or any other concerning symptoms given his age and comorbidities influenza could be a very severe illness for him.  Blood Pressure consistent with his previous reading 1 week ago.  Leg weakness l ikely 2/2 to influenza, strength is actually well preserved on exam.  Greater than 25 minutes was spent on the patient's case, more than half of that was in direct face-to-face consultation.  Orders Placed This Encounter  Procedures  . Veritor Flu A/B Waived    Order Specific Question:   Source    Answer:   nasal    Meds ordered this encounter  Medications  . oseltamivir (TAMIFLU) 75 MG capsule    Sig: Take 1 capsule (75 mg total) by mouth 2 (two) times daily.    Dispense:  10 capsule    Refill:  0  . cefdinir (OMNICEF) 300 MG capsule    Sig: Take 1 capsule (300 mg total) by mouth 2 (two) times daily. 1 po BID    Dispense:  20 capsule    Refill:  0    Laroy Apple, MD [facility] 10/09/2016, 12:30 PM

## 2016-10-21 ENCOUNTER — Ambulatory Visit (INDEPENDENT_AMBULATORY_CARE_PROVIDER_SITE_OTHER): Payer: PPO | Admitting: Pharmacist

## 2016-10-21 DIAGNOSIS — R791 Abnormal coagulation profile: Secondary | ICD-10-CM | POA: Diagnosis not present

## 2016-10-21 DIAGNOSIS — I480 Paroxysmal atrial fibrillation: Secondary | ICD-10-CM | POA: Diagnosis not present

## 2016-10-21 LAB — COAGUCHEK XS/INR WAIVED
INR: >8 (ref 0.9–1.1)
Prothrombin Time: >96 s

## 2016-10-21 NOTE — Patient Instructions (Addendum)
Anticoagulation Dose Instructions as of 10/21/2016      Jimmy Myers Tue Wed Thu Fri Sat   New Dose 5 mg 2.5 mg Hold Hold 5 mg 2.5 mg 5 mg   Alt Week 5 mg 2.5 mg 5 mg 5 mg 5 mg 2.5 mg 5 mg    Description   No warfarin today (10/21/2016) or tomorrow (10/22/2016).  Then decrease warfarin dose to 1/2 tablet on Mondays and Fridays.  Take 1 tablet all other days.  INR was over 8.0 today

## 2016-10-22 ENCOUNTER — Encounter (HOSPITAL_COMMUNITY)
Admission: RE | Admit: 2016-10-22 | Discharge: 2016-10-22 | Disposition: A | Discharge status: Home / Self Care | Payer: PPO | Source: Ambulatory Visit | Attending: Ophthalmology | Admitting: Ophthalmology

## 2016-10-22 ENCOUNTER — Encounter: Payer: Self-pay | Admitting: Nurse Practitioner

## 2016-10-22 ENCOUNTER — Ambulatory Visit (INDEPENDENT_AMBULATORY_CARE_PROVIDER_SITE_OTHER): Payer: PPO | Admitting: Nurse Practitioner

## 2016-10-22 ENCOUNTER — Inpatient Hospital Stay (HOSPITAL_COMMUNITY)
Admission: EM | Admit: 2016-10-22 | Discharge: 2016-11-01 | DRG: 409 | Disposition: A | Discharge status: Skilled Nursing Facility | Payer: PPO | Attending: Nephrology | Admitting: Nephrology

## 2016-10-22 ENCOUNTER — Other Ambulatory Visit: Payer: PPO

## 2016-10-22 ENCOUNTER — Emergency Department (HOSPITAL_COMMUNITY): Payer: PPO

## 2016-10-22 ENCOUNTER — Telehealth: Payer: Self-pay | Admitting: *Deleted

## 2016-10-22 ENCOUNTER — Encounter (HOSPITAL_COMMUNITY): Payer: Self-pay

## 2016-10-22 VITALS — BP 118/68 | HR 96 | Ht 70.0 in | Wt 203.1 lb

## 2016-10-22 DIAGNOSIS — F419 Anxiety disorder, unspecified: Secondary | ICD-10-CM | POA: Diagnosis not present

## 2016-10-22 DIAGNOSIS — C8590 Non-Hodgkin lymphoma, unspecified, unspecified site: Secondary | ICD-10-CM | POA: Diagnosis present

## 2016-10-22 DIAGNOSIS — C220 Liver cell carcinoma: Secondary | ICD-10-CM

## 2016-10-22 DIAGNOSIS — Z862 Personal history of diseases of the blood and blood-forming organs and certain disorders involving the immune mechanism: Secondary | ICD-10-CM | POA: Diagnosis not present

## 2016-10-22 DIAGNOSIS — K8309 Other cholangitis: Secondary | ICD-10-CM

## 2016-10-22 DIAGNOSIS — Z9842 Cataract extraction status, left eye: Secondary | ICD-10-CM | POA: Diagnosis not present

## 2016-10-22 DIAGNOSIS — R634 Abnormal weight loss: Secondary | ICD-10-CM | POA: Diagnosis not present

## 2016-10-22 DIAGNOSIS — R791 Abnormal coagulation profile: Secondary | ICD-10-CM | POA: Diagnosis not present

## 2016-10-22 DIAGNOSIS — R7989 Other specified abnormal findings of blood chemistry: Secondary | ICD-10-CM | POA: Diagnosis not present

## 2016-10-22 DIAGNOSIS — I513 Intracardiac thrombosis, not elsewhere classified: Secondary | ICD-10-CM | POA: Diagnosis not present

## 2016-10-22 DIAGNOSIS — Q443 Congenital stenosis and stricture of bile ducts: Secondary | ICD-10-CM | POA: Diagnosis not present

## 2016-10-22 DIAGNOSIS — I10 Essential (primary) hypertension: Secondary | ICD-10-CM | POA: Diagnosis present

## 2016-10-22 DIAGNOSIS — K8020 Calculus of gallbladder without cholecystitis without obstruction: Secondary | ICD-10-CM | POA: Diagnosis not present

## 2016-10-22 DIAGNOSIS — K219 Gastro-esophageal reflux disease without esophagitis: Secondary | ICD-10-CM | POA: Diagnosis present

## 2016-10-22 DIAGNOSIS — R17 Unspecified jaundice: Secondary | ICD-10-CM | POA: Diagnosis present

## 2016-10-22 DIAGNOSIS — C259 Malignant neoplasm of pancreas, unspecified: Secondary | ICD-10-CM | POA: Diagnosis not present

## 2016-10-22 DIAGNOSIS — I4891 Unspecified atrial fibrillation: Secondary | ICD-10-CM | POA: Diagnosis present

## 2016-10-22 DIAGNOSIS — K830 Cholangitis: Secondary | ICD-10-CM | POA: Diagnosis not present

## 2016-10-22 DIAGNOSIS — Z66 Do not resuscitate: Secondary | ICD-10-CM | POA: Diagnosis present

## 2016-10-22 DIAGNOSIS — C801 Malignant (primary) neoplasm, unspecified: Secondary | ICD-10-CM | POA: Diagnosis not present

## 2016-10-22 DIAGNOSIS — K7581 Nonalcoholic steatohepatitis (NASH): Secondary | ICD-10-CM | POA: Diagnosis not present

## 2016-10-22 DIAGNOSIS — D684 Acquired coagulation factor deficiency: Secondary | ICD-10-CM | POA: Diagnosis not present

## 2016-10-22 DIAGNOSIS — I5042 Chronic combined systolic (congestive) and diastolic (congestive) heart failure: Secondary | ICD-10-CM | POA: Diagnosis present

## 2016-10-22 DIAGNOSIS — R531 Weakness: Secondary | ICD-10-CM | POA: Diagnosis not present

## 2016-10-22 DIAGNOSIS — I110 Hypertensive heart disease with heart failure: Secondary | ICD-10-CM | POA: Diagnosis not present

## 2016-10-22 DIAGNOSIS — D649 Anemia, unspecified: Secondary | ICD-10-CM | POA: Diagnosis not present

## 2016-10-22 DIAGNOSIS — I482 Chronic atrial fibrillation: Secondary | ICD-10-CM | POA: Diagnosis not present

## 2016-10-22 DIAGNOSIS — Z79899 Other long term (current) drug therapy: Secondary | ICD-10-CM

## 2016-10-22 DIAGNOSIS — R935 Abnormal findings on diagnostic imaging of other abdominal regions, including retroperitoneum: Secondary | ICD-10-CM | POA: Diagnosis not present

## 2016-10-22 DIAGNOSIS — C221 Intrahepatic bile duct carcinoma: Secondary | ICD-10-CM | POA: Diagnosis not present

## 2016-10-22 DIAGNOSIS — Z961 Presence of intraocular lens: Secondary | ICD-10-CM | POA: Diagnosis present

## 2016-10-22 DIAGNOSIS — K838 Other specified diseases of biliary tract: Secondary | ICD-10-CM | POA: Diagnosis not present

## 2016-10-22 DIAGNOSIS — D696 Thrombocytopenia, unspecified: Secondary | ICD-10-CM | POA: Diagnosis present

## 2016-10-22 DIAGNOSIS — Z7901 Long term (current) use of anticoagulants: Secondary | ICD-10-CM

## 2016-10-22 DIAGNOSIS — K831 Obstruction of bile duct: Secondary | ICD-10-CM

## 2016-10-22 DIAGNOSIS — K8021 Calculus of gallbladder without cholecystitis with obstruction: Secondary | ICD-10-CM | POA: Diagnosis present

## 2016-10-22 DIAGNOSIS — K210 Gastro-esophageal reflux disease with esophagitis: Secondary | ICD-10-CM | POA: Diagnosis not present

## 2016-10-22 DIAGNOSIS — I5022 Chronic systolic (congestive) heart failure: Secondary | ICD-10-CM | POA: Diagnosis not present

## 2016-10-22 DIAGNOSIS — I480 Paroxysmal atrial fibrillation: Secondary | ICD-10-CM | POA: Diagnosis not present

## 2016-10-22 DIAGNOSIS — M1990 Unspecified osteoarthritis, unspecified site: Secondary | ICD-10-CM | POA: Diagnosis not present

## 2016-10-22 DIAGNOSIS — C240 Malignant neoplasm of extrahepatic bile duct: Secondary | ICD-10-CM | POA: Diagnosis not present

## 2016-10-22 LAB — CBC WITH DIFFERENTIAL/PLATELET
BASOS: 0 %
Basophils Absolute: 0 10*3/uL (ref 0.0–0.2)
Basophils Absolute: 0 10*3/uL (ref 0.0–0.2)
Basos: 0 %
EOS (ABSOLUTE): 0.1 10*3/uL (ref 0.0–0.4)
EOS (ABSOLUTE): 0 10*3/uL (ref 0.0–0.4)
EOS: 1 %
Eos: 0 %
HEMATOCRIT: 35.9 % — AB (ref 37.5–51.0)
HEMOGLOBIN: 11.3 g/dL — AB (ref 13.0–17.7)
Hematocrit: 33.8 % — ABNORMAL LOW (ref 37.5–51.0)
Hemoglobin: 12 g/dL — ABNORMAL LOW (ref 13.0–17.7)
IMMATURE GRANS (ABS): 0.1 10*3/uL (ref 0.0–0.1)
Immature Granulocytes: 1 %
LYMPHS: 20 %
Lymphocytes Absolute: 1.2 10*3/uL (ref 0.7–3.1)
Lymphocytes Absolute: 1.3 10*3/uL (ref 0.7–3.1)
Lymphs: 17 %
MCH: 30.6 pg (ref 26.6–33.0)
MCH: 32.2 pg (ref 26.6–33.0)
MCHC: 31.5 g/dL (ref 31.5–35.7)
MCHC: 35.5 g/dL (ref 31.5–35.7)
MCV: 97 fL (ref 79–97)
MCV: 91 fL (ref 79–97)
MONOCYTES: 11 %
Monocytes Absolute: 0.7 10*3/uL (ref 0.1–0.9)
Monocytes Absolute: 1.3 10*3/uL — ABNORMAL HIGH (ref 0.1–0.9)
Monocytes: 16 %
NEUTROS ABS: 4 10*3/uL (ref 1.4–7.0)
Neutrophils Absolute: 5.2 10*3/uL (ref 1.4–7.0)
Neutrophils: 67 %
Neutrophils: 67 %
Platelets: 166 10*3/uL (ref 150–379)
Platelets: 146 10*3/uL — ABNORMAL LOW (ref 150–379)
RBC: 3.69 x10E6/uL — ABNORMAL LOW (ref 4.14–5.80)
RBC: 3.73 x10E6/uL — ABNORMAL LOW (ref 4.14–5.80)
RDW: 14.6 % (ref 12.3–15.4)
RDW: 14.3 % (ref 12.3–15.4)
WBC: 6 10*3/uL (ref 3.4–10.8)
WBC: 7.8 10*3/uL (ref 3.4–10.8)

## 2016-10-22 LAB — HEPATIC FUNCTION PANEL
ALT: 144 IU/L — ABNORMAL HIGH (ref 0–44)
AST: 113 IU/L — ABNORMAL HIGH (ref 0–40)
Albumin: 3.4 g/dL — ABNORMAL LOW (ref 3.5–4.7)
Alkaline Phosphatase: 562 IU/L — ABNORMAL HIGH (ref 39–117)
Bilirubin Total: 8.5 mg/dL — ABNORMAL HIGH (ref 0.0–1.2)
Bilirubin, Direct: 5.49 mg/dL — ABNORMAL HIGH (ref 0.00–0.40)
Total Protein: 5.3 g/dL — ABNORMAL LOW (ref 6.0–8.5)

## 2016-10-22 LAB — URINALYSIS
Bilirubin, UA: POSITIVE — AB
Glucose, UA: NEGATIVE
Leukocytes, UA: NEGATIVE
Nitrite, UA: NEGATIVE
Protein, UA: NEGATIVE
RBC, UA: NEGATIVE
Specific Gravity, UA: 1.015 (ref 1.005–1.030)
Urobilinogen, Ur: 0.2 mg/dL (ref 0.2–1.0)
pH, UA: 5 (ref 5.0–7.5)

## 2016-10-22 LAB — BASIC METABOLIC PANEL
BUN/Creatinine Ratio: 18 (ref 10–24)
BUN: 21 mg/dL (ref 8–27)
CO2: 25 mmol/L (ref 18–29)
Calcium: 8.6 mg/dL (ref 8.6–10.2)
Chloride: 100 mmol/L (ref 96–106)
Creatinine, Ser: 1.15 mg/dL (ref 0.76–1.27)
GFR calc Af Amer: 68 (ref >59)
GFR calc non Af Amer: 59 — ABNORMAL LOW (ref >59)
Glucose: 95 mg/dL (ref 65–99)
Potassium: 5.1 mmol/L (ref 3.5–5.2)
Sodium: 130 mmol/L — ABNORMAL LOW (ref 134–144)

## 2016-10-22 LAB — PROTIME-INR
INR: 11.8 — AB (ref 0.8–1.2)
INR: 5.2
Prothrombin Time: 105.4 s — ABNORMAL HIGH (ref 9.1–12.0)
Prothrombin Time: 49 seconds — ABNORMAL HIGH (ref 11.4–15.2)

## 2016-10-22 MED ORDER — IOPAMIDOL (ISOVUE-300) INJECTION 61%
INTRAVENOUS | Status: AC
  Administered 2016-10-22: 100 mL
  Filled 2016-10-22: qty 100

## 2016-10-22 NOTE — H&P (Signed)
History and Physical    Jimmy Myers [id number] DOB: 01-Mar-1932 DOA: 10/22/2016  PCP: Kenn File, MD   Patient coming from: Home.  Chief Complaint: Jaundice.  HPI: Jimmy Myers is a 83 y.o. male with medical history significant of chronic atrial fibrillation, anxiety, aortic root enlargement, osteoarthritis, history of atrial thrombus, lymphoma, chronic systolic congestive heart failure, diverticulosis, GERD, hemorrhoids, hypertension, history of pneumonia, recently treated for influenza who is being referred to the emergency department by his primary care provider due to progressively worse jaundice.  The patient also complains of night sweats, on and off diarrhea, and weight loss of 4-5 pounds in the past week plus. He denies fever, abdominal pain, nausea, emesis, constipation, melena or hematochezia. He denies dysuria or hematuria. He denies chest pain, palpitations, dizziness, diaphoresis, PND or orthopnea.  ED Course: Earlier labs show WBC 7.8, hemoglobin 12.0, platelets 146. INR was 5.20, previously this week was > 8.0. CMP showed a sodium of 130, albumin 3.4, alkaline phosphatase 562, AST 113, ALT 144, total bilirubin of 8.5.  Imaging: CT scan abdomen/pelvis showed moderate intrahepatic biliary ductal dilation, mild distention of the gallbladder, distention of the CBD suspicious for CBD or pancreatic carcinoma. MRI MRCP recommended for further workup.  Review of Systems: As per HPI otherwise 10 point review of systems negative.    Past Medical History:  Diagnosis Date  . A-fib [facility]) May 2013   s/p TEE/cardioversion June 2013  . Anxiety   . Aortic root enlargement (HCC)    at least 5cm by CT  . Arthritis   . Atrial thrombus   . Bradycardia   . Bronchitis   . Cancer ([place]) 2016   lymphoma  . Chronic anticoagulation    on coumadin - checked in [place]  . Congestive heart failure (CHF) ([place]) 2013  . Diverticulosis   . Dysrhythmia    AFib  . GERD (gastroesophageal reflux  disease)   . Hemorrhoids   . HTN (hypertension)   . Pneumonia May 2013  . Pneumonia 2013  . 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 Thrombocytopenia ([place])     Past Surgical History:  Procedure Laterality Date  . CARDIOVERSION  12/30/2011   Procedure: CARDIOVERSION;  Surgeon: Lelon Perla, MD;  Location: [facility] ENDOSCOPY;  Service: Cardiovascular;  Laterality: N/A;  . CARDIOVERSION  02/16/2012   Procedure: CARDIOVERSION;  Surgeon: Lelon Perla, MD;  Location: [facility] ENDOSCOPY;  Service: Cardiovascular;  Laterality: N/A;  . CATARACT EXTRACTION W/PHACO Left 07/28/2016   Procedure: CATARACT EXTRACTION PHACO AND INTRAOCULAR LENS PLACEMENT LEFT EYE:  CDE: 13.00;  Surgeon: Tonny Branch, MD;  Location: [facility] ORS;  Service: Ophthalmology;  Laterality: Left;  . ESOPHAGOGASTRODUODENOSCOPY  12/30/2011   Procedure: ESOPHAGOGASTRODUODENOSCOPY (EGD);  Surgeon: Lelon Perla, MD;  Location: [facility] ENDOSCOPY;  Service: Cardiovascular;  Laterality: N/A;  . ESOPHAGOGASTRODUODENOSCOPY  12/30/2011   Procedure: ESOPHAGOGASTRODUODENOSCOPY (EGD);  Surgeon: Lafayette Dragon, MD;  Location: [facility] ENDOSCOPY;  Service: Endoscopy;  Laterality: N/A;  . KNEE ARTHROSCOPY  rt knee  . PROSTATE SURGERY    . TEE WITHOUT CARDIOVERSION  12/30/2011   Procedure: TRANSESOPHAGEAL ECHOCARDIOGRAM (TEE);  Surgeon: Lelon Perla, MD;  Location: [facility] ENDOSCOPY;  Service: Cardiovascular;  Laterality: N/A;  . TEE WITHOUT CARDIOVERSION  02/16/2012   Procedure: TRANSESOPHAGEAL ECHOCARDIOGRAM (TEE);  Surgeon: Lelon Perla, MD;  Location: [facility]  ENDOSCOPY;  Service: Cardiovascular;  Laterality: N/A;     reports that he has never smoked. He has never used smokeless tobacco. He reports that he does not drink alcohol or use drugs.  Allergies  Allergen Reactions  . Codeine Other (See Comments)    nervousness  .  Simvastatin Other (See Comments)    Muscle pain/hurting  . Vicodin [Hydrocodone-Acetaminophen] Other (See Comments)    nervousness    Family History  Problem Relation Age of Onset  . Heart disease Mother   . Heart disease Father   . Stomach cancer Brother   . Heart disease Sister   . Heart disease Brother   . Hypertension Sister     Prior to Admission medications   Medication Sig Start Date End Date Taking? Authorizing Provider  albuterol (PROVENTIL HFA;VENTOLIN HFA) 108 (90 BASE) MCG/ACT inhaler Inhale 2 puffs into the lungs every 6 (six) hours as needed for wheezing or shortness of breath.   Yes Historical Provider, MD  clorazepate (TRANXENE) 3.75 MG tablet Take 1.875-3.75 mg by mouth daily as needed for anxiety.    Yes Historical Provider, MD  Emollient (EUCERIN) lotion Apply 3-5 mLs topically daily.    Yes Historical Provider, MD  fluticasone (FLONASE) 50 MCG/ACT nasal spray Place 1 spray into both nostrils daily as needed for allergies or rhinitis.   Yes Historical Provider, MD  folic acid (FOLVITE) [id number] MCG tablet Take 800 mcg by mouth daily.   Yes Historical Provider, MD  furosemide (LASIX) 40 MG tablet TAKE ONE TABLET BY MOUTH ONCE DAILY 09/26/16  Yes Burtis Junes, NP  guaiFENesin (MUCINEX) 600 MG 12 hr tablet Take 600 mg by mouth 2 (two) times daily as needed.   Yes Historical Provider, MD  lisinopril (PRINIVIL,ZESTRIL) 5 MG tablet TAKE ONE TABLET BY MOUTH ONCE DAILY Patient taking differently: TAKE ONE TABLET BY MOUTH ONCE DAILY AT 5PM 06/27/16  Yes Burtis Junes, NP  loperamide (IMODIUM A-D) 2 MG tablet Take 2 mg by mouth 4 (four) times daily as needed for diarrhea or loose stools.   Yes Historical Provider, MD  nitroGLYCERIN (NITROSTAT) 0.4 MG SL tablet Place 1 tablet (0.4 mg total) under the tongue every 5 (five) minutes x 3 doses as needed for chest pain. 05/15/14 10/06/17 Yes Burtis Junes, NP  omeprazole (PRILOSEC) 20 MG capsule Take 20 mg by mouth daily before  breakfast.    Yes Historical Provider, MD  phytonadione (VITAMIN K) 5 MG tablet Take 5 mg by mouth once.   Yes Historical Provider, MD  polyethylene glycol (MIRALAX / GLYCOLAX) packet Take 17 g by mouth at bedtime as needed.    Yes Historical Provider, MD  potassium chloride SA (K-DUR,KLOR-CON) 20 MEQ tablet Take 20 mEq by mouth 2 (two) times daily.   Yes Historical Provider, MD  warfarin (COUMADIN) 5 MG tablet Take as directed by coumadin clinic Patient taking differently: Take 2.5-5 mg by mouth See admin instructions. Take 2.5mg  on MONDAYS AND FRIDAYS, take 5mg s daily on all other days of the week 07/13/13  Yes Thompson Grayer, MD  cefdinir (OMNICEF) 300 MG capsule Take 300 mg by mouth 2 (two) times daily. 10/09/16   Historical Provider, MD  oseltamivir (TAMIFLU) 75 MG capsule Take 75 mg by mouth 2 (two) times daily. 10/09/16   Historical Provider, MD    Physical Exam:  Constitutional: NAD, calm, comfortable Vitals:   10/22/16 2100 10/22/16 2130 10/22/16 2241 10/22/16 2300  BP: 115/74 135/77 123/62 109/70  Pulse: 95 103  74 90  Resp: 19 19 22 19   Temp:      TempSrc:      SpO2: 97% 97% 97% 97%  Weight:      Height:       Eyes: PERRL, Scleral icterus, lids and conjunctivae normal ENMT: Mucous membranes are moist. Posterior pharynx clear of any exudate or lesions. Neck: normal, supple, no masses, no thyromegaly Respiratory: Clear to auscultation bilaterally, no wheezing, no crackles. Normal respiratory effort. No accessory muscle use.  Cardiovascular: irregularly irregular, no murmurs / rubs / gallops. No extremity edema. 2+ pedal pulses. No carotid bruits.  Abdomen: Soft,no tenderness, no masses palpated. No hepatosplenomegaly. Bowel sounds positive.  Musculoskeletal: no clubbing / cyanosis. Good ROM, no contractures. Normal muscle tone.  Skin: Skin is jaundiced. Neurologic: CN 2-12 grossly intact. Sensation intact, DTR normal. Strength 5/5 in all 4.  Psychiatric: Normal judgment and  insight. Alert and oriented x 3. Normal mood.    Labs on Admission: I have personally reviewed following labs and imaging studies  CBC:  Recent Labs Lab 10/21/16 1010 10/22/16 1232  WBC 6.0 7.8  NEUTROABS 4.0 5.2  HCT 35.9* 33.8*  MCV 97 91  PLT 166 [id number]*   Basic Metabolic Panel:  Recent Labs Lab 10/22/16 1232  NA 130*  K 5.1  CL 100  CO2 25  GLUCOSE 95  BUN 21  CREATININE 1.15  CALCIUM 8.6   GFR: Estimated Creatinine Clearance: 56.5 mL/min (by C-G formula based on SCr of 1.15 mg/dL). Liver Function Tests:  Recent Labs Lab 10/22/16 1232  AST 113*  ALT 144*  ALKPHOS 562*  BILITOT 8.5*  PROT 5.3*  ALBUMIN 3.4*   No results for input(s): LIPASE, AMYLASE in the last 168 hours. No results for input(s): AMMONIA in the last 168 hours. Coagulation Profile:  Recent Labs Lab 10/21/16 1001 10/21/16 1010 10/21/16 1011 10/22/16 2105  INR >8.0* 11.8* >8.0* 5.20*   Cardiac Enzymes: No results for input(s): CKTOTAL, CKMB, CKMBINDEX, TROPONINI in the last 168 hours. BNP (last 3 results) No results for input(s): PROBNP in the last 8760 hours. HbA1C: No results for input(s): HGBA1C in the last 72 hours. CBG: No results for input(s): GLUCAP in the last 168 hours. Lipid Profile: No results for input(s): CHOL, HDL, LDLCALC, TRIG, CHOLHDL, LDLDIRECT in the last 72 hours. Thyroid Function Tests: No results for input(s): TSH, T4TOTAL, FREET4, T3FREE, THYROIDAB in the last 72 hours. Anemia Panel: No results for input(s): VITAMINB12, FOLATE, FERRITIN, TIBC, IRON, RETICCTPCT in the last 72 hours. Urine analysis:    Component Value Date/Time   COLORURINE YELLOW 12/28/2011 1658   APPEARANCEUR Clear 10/22/2016 1232   LABSPEC 1.012 12/28/2011 1658   PHURINE 5.5 12/28/2011 1658   GLUCOSEU Negative 10/22/2016 1232   HGBUR SMALL (A) 12/28/2011 1658   BILIRUBINUR Positive (A) 10/22/2016 1232   KETONESUR NEGATIVE 12/28/2011 1658   PROTEINUR Negative 10/22/2016 1232    PROTEINUR NEGATIVE 12/28/2011 1658   UROBILINOGEN 1.0 12/28/2011 1658   NITRITE Negative 10/22/2016 1232   NITRITE NEGATIVE 12/28/2011 1658   LEUKOCYTESUR Negative 10/22/2016 1232    Radiological Exams on Admission: Ct Abdomen Pelvis W Contrast  Result Date: 10/22/2016 CLINICAL DATA:  83 y/o  M; elevated liver enzymes and jaundice. EXAM: CT ABDOMEN AND PELVIS WITH CONTRAST TECHNIQUE: Multidetector CT imaging of the abdomen and pelvis was performed using the standard protocol following bolus administration of intravenous contrast. CONTRAST:  159mL ISOVUE-300 IOPAMIDOL (ISOVUE-300) INJECTION 61% COMPARISON:  08/16/2012 CT of the abdomen and pelvis.  FINDINGS: Lower chest: Moderate cardiomegaly. Aortic valvular calcification. No acute abnormality. Hepatobiliary: Moderate intrahepatic biliary ductal dilatation, mild distention of the gallbladder, a distention of the common bile duct up to 19 mm (series 5, image 69). The common bile duct is abruptly cut off as it enters the head of the pancreas with soft tissue filling the lumen. Two small gallstones within the gallbladder. Pancreas: No main duct dilatation of the pancreas. No inflammatory change. Spleen: Normal in size without focal abnormality. Adrenals/Urinary Tract: Lucencies in the kidneys the largest arising exophytic from the upper pole of right kidney measuring 41 mm, likely representing cysts. Normal adrenal glands. No urinary stone disease or obstructive uropathy. Normal bladder. Stomach/Bowel: Stomach is within normal limits. Appendix appears normal. No evidence of bowel wall thickening, distention, or inflammatory changes. Vascular/Lymphatic: Aortic atherosclerosis. No enlarged abdominal or pelvic lymph nodes. Reproductive: Mild prostate hypertrophy and calcifications. Other: No ascites. Musculoskeletal: No acute osseous abnormality. Multilevel degenerative changes of the spine with discogenic and prominent lower lumbar facet arthropathy. IMPRESSION:  1. Moderate intrahepatic biliary ductal dilatation, mild distention of the gallbladder, a distention of the common bile duct up to 19 mm. The common bile duct is abruptly cut off as it enters the head of the pancreas with soft tissue filling the lumen. 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 MRI/MRCP is recommended with and without intravenous contrast for further evaluation. 2. Moderate cardiomegaly. 3. Cholelithiasis. 4. Aortic atherosclerosis. 5. Prostate hypertrophy. Electronically Signed   By: Kristine Garbe M.D.   On: 10/22/2016 22:29    Assessment/Plan Principal Problem:   Jaundice Secondary to possible carcinoma of the CBD or pancreatic head. Admit to telemetry/inpatient. Keep nothing by mouth. Check MRI/MRCP in a.m. Follow-up CBC and CMP in a.m. Famotidine 20 mg IVPB every 12 hours. Zofran 4 mg every 6 hours as needed for nausea. Consult GI and/or IR in a.m.  Active Problems:   Chronic systolic heart failure (HCC) Compensated. Resume lisinopril 5 mg by mouth daily once cleared for oral intake.    Atrial fibrillation Not on rate control agent. INR supratherapeutic during Continue cardiac monitoring. Monitor magnesium and potassium.    HTN (hypertension), benign Stable. Resume lisinopril once cleared for oral intake. Monitor blood pressure. May need IV medications while nothing by mouth.    Supratherapeutic INR Received vitamin K yesterday. Monitor PT and INR daily.    GERD (gastroesophageal reflux disease) Famotidine 20 mg IVPB every 12 hours    Anxiety Lorazepam 0.5 mg IVP on-call to MRI suite.     DVT prophylaxis: Supratherapeutic INR. Code Status: Full code. Family Communication: His wife was present in the ED room. Disposition Plan: Admit for MRCP in AM. GI and/or IR consultation. Consults called:  Admission status: Inpatient/Telemetry.   Reubin Milan MD [facility] Pager  [phone].  If 7PM-7AM, please contact night-coverage [email or web address] Password [facility]  10/22/2016, 11:34 PM

## 2016-10-22 NOTE — ED Provider Notes (Signed)
[facility] DEPT Provider Note   CSN: [id number] Arrival date & time: 10/22/16  1639     History   Chief Complaint Chief Complaint  Patient presents with  . Jaundice  . Elevated Hepatic Enzymes    HPI Jimmy Myers is a 83 y.o. male.  HPI  Pt comes in with cc of abnormal labs. Pt has hx of lymphoma. He also has AF and Aortic root enlargement, CHF. Pt was seen by pcp, and he was noted to be jaundiced, so labs were drawn, and pt advised to come to the ER. PT has no abd pain. He reports that he hasnt had an appetite the several days. Pt also is having night sweats.   Past Medical History:  Diagnosis Date  . A-fib [facility]) May 2013   s/p TEE/cardioversion June 2013  . Anxiety   . Aortic root enlargement (HCC)    at least 5cm by CT  . Arthritis   . Atrial thrombus   . Bradycardia   . Bronchitis   . Cancer ([place]) 2016   lymphoma  . Chronic anticoagulation    on coumadin - checked in [place]  . Congestive heart failure (CHF) ([place]) 2013  . Diverticulosis   . Dysrhythmia    AFib  . GERD (gastroesophageal reflux disease)   . Hemorrhoids   . HTN (hypertension)   . Pneumonia May 2013  . Pneumonia 2013  . 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 Thrombocytopenia [facility])     Patient Active Problem List   Diagnosis Date Noted  . Jaundice 10/22/2016  . Cough 10/09/2016  . Thyroid goiter 02/26/2012  . Aortic root enlargement ([place]) 01/12/2012  . Long term (current) use of anticoagulants 01/06/2012  . Thrombus of left atrial appendage 01/06/2012  . Acute Systolic dysfunction/cardiomyopathy due to prolonged tachycardia/EF 25% 12/30/2011  . Thrombocytopenia ([place]) 12/30/2011  . Other dysphagia 12/30/2011  . Chronic systolic heart failure ([place]) 12/28/2011  . Atrial fibrillation 12/28/2011  . HTN (hypertension), benign 12/28/2011    Past  Surgical History:  Procedure Laterality Date  . CARDIOVERSION  12/30/2011   Procedure: CARDIOVERSION;  Surgeon: Lelon Perla, MD;  Location: [facility] ENDOSCOPY;  Service: Cardiovascular;  Laterality: N/A;  . CARDIOVERSION  02/16/2012   Procedure: CARDIOVERSION;  Surgeon: Lelon Perla, MD;  Location: [facility] ENDOSCOPY;  Service: Cardiovascular;  Laterality: N/A;  . CATARACT EXTRACTION W/PHACO Left 07/28/2016   Procedure: CATARACT EXTRACTION PHACO AND INTRAOCULAR LENS PLACEMENT LEFT EYE:  CDE: 13.00;  Surgeon: Tonny Branch, MD;  Location: [facility] ORS;  Service: Ophthalmology;  Laterality: Left;  . ESOPHAGOGASTRODUODENOSCOPY  12/30/2011   Procedure: ESOPHAGOGASTRODUODENOSCOPY (EGD);  Surgeon: Lelon Perla, MD;  Location: [facility] ENDOSCOPY;  Service: Cardiovascular;  Laterality: N/A;  . ESOPHAGOGASTRODUODENOSCOPY  12/30/2011   Procedure: ESOPHAGOGASTRODUODENOSCOPY (EGD);  Surgeon: Lafayette Dragon, MD;  Location: [facility] ENDOSCOPY;  Service: Endoscopy;  Laterality: N/A;  . KNEE ARTHROSCOPY  rt knee  . PROSTATE SURGERY    . TEE WITHOUT CARDIOVERSION  12/30/2011   Procedure: TRANSESOPHAGEAL ECHOCARDIOGRAM (TEE);  Surgeon: Lelon Perla, MD;  Location: [facility] ENDOSCOPY;  Service: Cardiovascular;  Laterality: N/A;  . TEE WITHOUT CARDIOVERSION  02/16/2012   Procedure: TRANSESOPHAGEAL ECHOCARDIOGRAM (TEE);  Surgeon: Lelon Perla, MD;  Location: [facility] ENDOSCOPY;  Service: Cardiovascular;  Laterality: N/A;       Home Medications  Prior to Admission medications   Medication Sig Start Date End Date Taking? Authorizing Provider  albuterol (PROVENTIL HFA;VENTOLIN HFA) 108 (90 BASE) MCG/ACT inhaler Inhale 2 puffs into the lungs every 6 (six) hours as needed for wheezing or shortness of breath.   Yes Historical Provider, MD  clorazepate (TRANXENE) 3.75 MG tablet Take 1.875-3.75 mg by mouth daily as needed for anxiety.    Yes Historical Provider, MD  Emollient (EUCERIN) lotion Apply 3-5 mLs topically daily.    Yes Historical  Provider, MD  fluticasone (FLONASE) 50 MCG/ACT nasal spray Place 1 spray into both nostrils daily as needed for allergies or rhinitis.   Yes Historical Provider, MD  folic acid (FOLVITE) Q000111Q MCG tablet Take 800 mcg by mouth daily.   Yes Historical Provider, MD  furosemide (LASIX) 40 MG tablet TAKE ONE TABLET BY MOUTH ONCE DAILY 09/26/16  Yes Burtis Junes, NP  guaiFENesin (MUCINEX) 600 MG 12 hr tablet Take 600 mg by mouth 2 (two) times daily as needed.   Yes Historical Provider, MD  lisinopril (PRINIVIL,ZESTRIL) 5 MG tablet TAKE ONE TABLET BY MOUTH ONCE DAILY Patient taking differently: TAKE ONE TABLET BY MOUTH ONCE DAILY AT 5PM 06/27/16  Yes Burtis Junes, NP  loperamide (IMODIUM A-D) 2 MG tablet Take 2 mg by mouth 4 (four) times daily as needed for diarrhea or loose stools.   Yes Historical Provider, MD  nitroGLYCERIN (NITROSTAT) 0.4 MG SL tablet Place 1 tablet (0.4 mg total) under the tongue every 5 (five) minutes x 3 doses as needed for chest pain. 05/15/14 10/06/17 Yes Burtis Junes, NP  omeprazole (PRILOSEC) 20 MG capsule Take 20 mg by mouth daily before breakfast.    Yes Historical Provider, MD  phytonadione (VITAMIN K) 5 MG tablet Take 5 mg by mouth once.   Yes Historical Provider, MD  polyethylene glycol (MIRALAX / GLYCOLAX) packet Take 17 g by mouth at bedtime as needed.    Yes Historical Provider, MD  potassium chloride SA (K-DUR,KLOR-CON) 20 MEQ tablet Take 20 mEq by mouth 2 (two) times daily.   Yes Historical Provider, MD  warfarin (COUMADIN) 5 MG tablet Take as directed by coumadin clinic Patient taking differently: Take 2.5-5 mg by mouth See admin instructions. Take 2.5mg  on MONDAYS AND FRIDAYS, take 5mg s daily on all other days of the week 07/13/13  Yes Thompson Grayer, MD  cefdinir (OMNICEF) 300 MG capsule Take 300 mg by mouth 2 (two) times daily. 10/09/16   Historical Provider, MD  oseltamivir (TAMIFLU) 75 MG capsule Take 75 mg by mouth 2 (two) times daily. 10/09/16   Historical  Provider, MD    Family History Family History  Problem Relation Age of Onset  . Heart disease Mother   . Heart disease Father   . Stomach cancer Brother   . Heart disease Sister   . Heart disease Brother   . Hypertension Sister     Social History Social History  Substance Use Topics  . Smoking status: Never Smoker  . Smokeless tobacco: Never Used  . Alcohol use No     Allergies   Codeine; Simvastatin; and Vicodin [hydrocodone-acetaminophen]   Review of Systems Review of Systems  Constitutional: Positive for appetite change, diaphoresis and unexpected weight change.  Gastrointestinal: Negative for abdominal pain.   ROS 10 Systems reviewed and are negative for acute change except as noted in the HPI.      Physical Exam Updated Vital Signs BP 123/62 (BP Location: Right Arm)   Pulse 74  Temp 99.7 F (37.6 C) (Oral)   Resp 22   Ht 5\' 10"  (1.778 m)   Wt 203 lb (92.1 kg)   SpO2 97%   BMI 29.13 kg/m   Physical Exam  Constitutional: He is oriented to person, place, and time. He appears well-developed.  HENT:  Head: Atraumatic.  Neck: Neck supple.  Cardiovascular: Normal rate.   Pulmonary/Chest: Effort normal.  Neurological: He is alert and oriented to person, place, and time.  Skin: Skin is warm.  Pt is jaundiced  Nursing note and vitals reviewed.    ED Treatments / Results  Labs (all labs ordered are listed, but only abnormal results are displayed) Labs Reviewed  PROTIME-INR - Abnormal; Notable for the following:       Result Value   Prothrombin Time 49.0 (*)    INR 5.20 (*)    All other components within normal limits    EKG  EKG Interpretation None       Radiology Ct Abdomen Pelvis W Contrast  Result Date: 10/22/2016 CLINICAL DATA:  84 y/o  M; elevated liver enzymes and jaundice. EXAM: CT ABDOMEN AND PELVIS WITH CONTRAST TECHNIQUE: Multidetector CT imaging of the abdomen and pelvis was performed using the standard protocol following  bolus administration of intravenous contrast. CONTRAST:  158mL ISOVUE-300 IOPAMIDOL (ISOVUE-300) INJECTION 61% COMPARISON:  08/16/2012 CT of the abdomen and pelvis. FINDINGS: Lower chest: Moderate cardiomegaly. Aortic valvular calcification. No acute abnormality. Hepatobiliary: Moderate intrahepatic biliary ductal dilatation, mild distention of the gallbladder, a distention of the common bile duct up to 19 mm (series 5, image 69). The common bile duct is abruptly cut off as it enters the head of the pancreas with soft tissue filling the lumen. Two small gallstones within the gallbladder. Pancreas: No main duct dilatation of the pancreas. No inflammatory change. Spleen: Normal in size without focal abnormality. Adrenals/Urinary Tract: Lucencies in the kidneys the largest arising exophytic from the upper pole of right kidney measuring 41 mm, likely representing cysts. Normal adrenal glands. No urinary stone disease or obstructive uropathy. Normal bladder. Stomach/Bowel: Stomach is within normal limits. Appendix appears normal. No evidence of bowel wall thickening, distention, or inflammatory changes. Vascular/Lymphatic: Aortic atherosclerosis. No enlarged abdominal or pelvic lymph nodes. Reproductive: Mild prostate hypertrophy and calcifications. Other: No ascites. Musculoskeletal: No acute osseous abnormality. Multilevel degenerative changes of the spine with discogenic and prominent lower lumbar facet arthropathy. IMPRESSION: 1. Moderate intrahepatic biliary ductal dilatation, mild distention of the gallbladder, a distention of the common bile duct up to 19 mm. The common bile duct is abruptly cut off as it enters the head of the pancreas with soft tissue filling the lumen. 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 MRI/MRCP is recommended with and without intravenous contrast for further evaluation. 2. Moderate cardiomegaly. 3. Cholelithiasis. 4. Aortic atherosclerosis. 5.  Prostate hypertrophy. Electronically Signed   By: Kristine Garbe M.D.   On: 10/22/2016 22:29    Procedures Procedures (including critical care time)  Medications Ordered in ED Medications  iopamidol (ISOVUE-300) 61 % injection (100 mLs  Contrast Given 10/22/16 2157)     Initial Impression / Assessment and Plan / ED Course  I have reviewed the triage vital signs and the nursing notes.  Pertinent labs & imaging results that were available during my care of the patient were reviewed by me and considered in my medical decision making (see chart for details).  Clinical Course as of Oct 22 2309  Wed Oct 22, 2016  2311 Results from the ER workup discussed with the patient face to face and all questions answered to the best of my ability.  CT Abdomen Pelvis W Contrast [AN]    Clinical Course User Index [AN] Varney Biles, MD    Pt has jaundice, it is painless and there are constitutionals that are concerning for cancer. CT ordered.   Final Clinical Impressions(s) / ED Diagnoses   Final diagnoses:  Hyperbilirubinemia  Jaundice  Hepatocellular carcinoma ([place])    New Prescriptions New Prescriptions   No medications on file     Varney Biles, MD 10/22/16 2311

## 2016-10-22 NOTE — Progress Notes (Signed)
CARDIOLOGY OFFICE NOTE  Date:  10/22/2016    Jimmy Myers Date of Birth: [birth year] Medical Record [id number]  PCP:  Kenn File, MD  Cardiologist:  [name]   Chief Complaint  Patient presents with  . Atrial Fibrillation  . Cardiomyopathy    4 month check - seen for Dr. [name]    History of Present Illness: Jimmy Myers is a 83 y.o. male who presents today for a 4 month check. Seen for Dr. [name].   He has systolic heart failure with an EF previously down to 20 to 25% with improvement after medical therapy - up to 50% per echo in August of 2013 and was 45 to 50% by echo in July of 2014. His other issues include atrial fib with prior cardioversion, bradycardia, enlarged ascending aorta (previously followed by Dr. [name]), thyroid nodules (with benign biopsy), chronic thrombocytopenia and HTN. Had an event monitor in 2014 - average HR of 61. 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 Coumadin is followed by the [place].  I have seen him several times over the past few years - he has done ok. He has been reluctant to change his medicines. Echo updated in May of 2017 and was unchanged - EF 40 to 45% - we have continued with medical therapy. He has opted to not have any more imaging of his aorta. At last visit in November, HR was lower and I stopped the beta blocker altogether.   Comes back today. Here with his wife. He does not seem his usual self - not argumentative as he usually is. Says he has "been bad". They have both had the flu earlier this month. He has fallen several times - this was before the flu - his legs have just "went out from under him. Has happened at home and at [facility]. One time could not get up "just couldn't get his legs going". He is not passing out at all. No palpitations. He has been treated with Tamiflu and Omnicef. Coumadin is messed up - INR up to 10 yesterday and he was given  vitamin K tablet this AM - dose unknown. Now going to Dr. Wendi Snipes at [facility] due to insurance change - this is where his coumadin will now be followed - was told to hold for just 2 days and he has a recheck on this Friday. They are both concerned about his yellowing skin color - mostly on the trunk of his body. Wife feels the color of his face has changed. He notes his urine has been pretty dark this week.   Past Medical History:  Diagnosis Date  . A-fib [facility]) May 2013   s/p TEE/cardioversion June 2013  . Anxiety   . Aortic root enlargement (HCC)    at least 5cm by CT  . Arthritis   . Atrial thrombus   . Bradycardia   . Bronchitis   . Cancer ([place]) 2016   lymphoma  . Chronic anticoagulation    on coumadin - checked in [place]  . Congestive heart failure (CHF) ([place]) 2013  . Diverticulosis   . Dysrhythmia    AFib  . GERD (gastroesophageal reflux disease)   . Hemorrhoids   . HTN (hypertension)   . Pneumonia May 2013  . Pneumonia 2013  . Systolic congestive heart failure with reduced left ventricular function, NYHA class 1 (HCC)    EF  is 20 to 25% per echo; felt to be due to reversible tachycardia induced CM  . Thrombocytopenia ([place])     Past Surgical History:  Procedure Laterality Date  . CARDIOVERSION  12/30/2011   Procedure: CARDIOVERSION;  Surgeon: Lelon Perla, MD;  Location: [facility] ENDOSCOPY;  Service: Cardiovascular;  Laterality: N/A;  . CARDIOVERSION  02/16/2012   Procedure: CARDIOVERSION;  Surgeon: Lelon Perla, MD;  Location: [facility] ENDOSCOPY;  Service: Cardiovascular;  Laterality: N/A;  . CATARACT EXTRACTION W/PHACO Left 07/28/2016   Procedure: CATARACT EXTRACTION PHACO AND INTRAOCULAR LENS PLACEMENT LEFT EYE:  CDE: 13.00;  Surgeon: Tonny Branch, MD;  Location: [facility] ORS;  Service: Ophthalmology;  Laterality: Left;  . ESOPHAGOGASTRODUODENOSCOPY  12/30/2011   Procedure: ESOPHAGOGASTRODUODENOSCOPY (EGD);  Surgeon: Lelon Perla, MD;  Location: [facility] ENDOSCOPY;   Service: Cardiovascular;  Laterality: N/A;  . ESOPHAGOGASTRODUODENOSCOPY  12/30/2011   Procedure: ESOPHAGOGASTRODUODENOSCOPY (EGD);  Surgeon: Lafayette Dragon, MD;  Location: [facility] ENDOSCOPY;  Service: Endoscopy;  Laterality: N/A;  . KNEE ARTHROSCOPY  rt knee  . PROSTATE SURGERY    . TEE WITHOUT CARDIOVERSION  12/30/2011   Procedure: TRANSESOPHAGEAL ECHOCARDIOGRAM (TEE);  Surgeon: Lelon Perla, MD;  Location: [facility] ENDOSCOPY;  Service: Cardiovascular;  Laterality: N/A;  . TEE WITHOUT CARDIOVERSION  02/16/2012   Procedure: TRANSESOPHAGEAL ECHOCARDIOGRAM (TEE);  Surgeon: Lelon Perla, MD;  Location: [facility] ENDOSCOPY;  Service: Cardiovascular;  Laterality: N/A;     Medications: Current Outpatient Prescriptions  Medication Sig Dispense Refill  . albuterol (PROVENTIL HFA;VENTOLIN HFA) 108 (90 BASE) MCG/ACT inhaler Inhale 2 puffs into the lungs every 6 (six) hours as needed for wheezing or shortness of breath.    . clorazepate (TRANXENE) 3.75 MG tablet Take 1.875-3.75 mg by mouth daily as needed for anxiety.     . Emollient (EUCERIN) lotion Apply 3-5 mLs topically 3 (three) times daily as needed for dry skin.    . fluticasone (FLONASE) 50 MCG/ACT nasal spray Place 1 spray into both nostrils daily as needed for allergies or rhinitis.    . folic acid (FOLVITE) Q000111Q MCG tablet Take 800 mcg by mouth daily.    . furosemide (LASIX) 40 MG tablet TAKE ONE TABLET BY MOUTH ONCE DAILY 90 tablet 2  . lisinopril (PRINIVIL,ZESTRIL) 5 MG tablet TAKE ONE TABLET BY MOUTH ONCE DAILY 90 tablet 2  . nitroGLYCERIN (NITROSTAT) 0.4 MG SL tablet Place 1 tablet (0.4 mg total) under the tongue every 5 (five) minutes x 3 doses as needed for chest pain. 25 tablet 6  . omeprazole (PRILOSEC) 20 MG capsule Take 20 mg by mouth daily before breakfast.     . polyethylene glycol (MIRALAX / GLYCOLAX) packet Take 17 g by mouth at bedtime.     . potassium chloride SA (K-DUR,KLOR-CON) 20 MEQ tablet Take 20 mEq by mouth 2 (two) times daily.      Marland Kitchen warfarin (COUMADIN) 5 MG tablet Take as directed by coumadin clinic (Patient taking differently: Take 2.5-5 mg by mouth See admin instructions. Take 2.5mg  on mondays and take 5mg s daily on all other days of the week) 120 tablet 1   No current facility-administered medications for this visit.     Allergies: Allergies  Allergen Reactions  . Codeine Other (See Comments)    nervousness  . Simvastatin Other (See Comments)    Muscle pain/hurting  . Vicodin [Hydrocodone-Acetaminophen] Other (See Comments)    nervousness    Social History: The patient  reports that he has never smoked. He has never used smokeless tobacco.  He reports that he does not drink alcohol or use drugs.   Family History: The patient's family history includes Heart disease in his brother, father, mother, and sister; Hypertension in his sister; Stomach cancer in his brother.   Review of Systems: Please see the history of present illness.   Otherwise, the review of systems is positive for none.   All other systems are reviewed and negative.   Physical Exam: VS:  BP 118/68   Pulse 96   Ht 5\' 10"  (1.778 m)   Wt 203 lb 1.9 oz (92.1 kg)   BMI 29.14 kg/m  .  BMI Body mass index is 29.14 kg/m.  Wt Readings from Last 3 Encounters:  10/22/16 203 lb 1.9 oz (92.1 kg)  10/09/16 204 lb 3.2 oz (92.6 kg)  10/03/16 205 lb 12.8 oz (93.4 kg)    General: Elderly male. He is not argumentative or gruff today as he usually is.  He is alert and in no acute distress.  The trunk of his body looks yellow. His legs do not.  His face has a dark yellow tent. Multiple bruising over his belly noted. His eyes may just be slightly yellow. HEENT: Normal.  Neck: Supple, no JVD, carotid bruits, or masses noted.  Cardiac: Regular rate and rhythm.  No edema.  Respiratory:  Lungs are clear to auscultation bilaterally with normal work of breathing.  GI: Soft and nontender. Not distended. Liver does not seem to be enlarged.  MS: No deformity  or atrophy. Gait and ROM intact.  Skin: Warm and dry. Color is normal.  Neuro:  Strength and sensation are intact and no gross focal deficits noted.  Psych: Alert, appropriate and with normal affect.   LABORATORY DATA:  EKG:  EKG is ordered today. This demonstrates NSR with PACs - his race is higher.  Lab Results  Component Value Date   WBC 6.0 10/21/2016   HGB 13.4 07/22/2016   HCT 35.9 (L) 10/21/2016   PLT 166 10/21/2016   GLUCOSE 107 (H) 07/22/2016   CHOL 85 12/29/2011   TRIG 30 12/29/2011   HDL 40 12/29/2011   LDLCALC 39 12/29/2011   ALT 14 03/01/2013   AST 19 03/01/2013   NA 138 07/22/2016   K 4.2 07/22/2016   CL 103 07/22/2016   CREATININE 1.03 07/22/2016   BUN 20 07/22/2016   CO2 28 07/22/2016   TSH 0.498 12/28/2011   INR >8.0 (>) 10/21/2016    BNP (last 3 results) No results for input(s): BNP in the last 8760 hours.  ProBNP (last 3 results) No results for input(s): PROBNP in the last 8760 hours.   Other Studies Reviewed Today:  Echo Study Conclusions from 12/2015  - Left ventricle: The cavity size was mildly dilated. Systolic function was mildly to moderately reduced. The estimated ejection fraction was in the range of 40% to 45%. Diffuse hypokinesis. Doppler parameters are consistent with abnormal left ventricular relaxation (grade 1 diastolic dysfunction). Doppler parameters are consistent with elevated mean left atrial filling pressure. - Aortic valve: There was mild regurgitation. - Mitral valve: There was mild regurgitation. - Left atrium: The atrium was mildly dilated.  Assessment / Plan:  1. PAF - in sinus by exam today. He remains anticoagulated with coumadin. His rate is faster and he is having PACs.   2. Bradycardia - No longer on any beta blocker therapy. I suspect he has some degree of sick sinus syndrome. Hopefully he will remain in sinus but HR faster today and  more PACs - will need to follow. No indication for PPM at this  time.   3. Chronic coumadin therapy - now checking at [facility] - INR over 8 and he has been treated with Vitamin K today orally - dose unknown - I have told him to hold until rechecked on Friday.  4. Systolic heart failure - EF remains45 to 50% - he does use extra diuretic based on weight gain. Weight is down now - most likely due to recent illness  5. Enlarged aorta - previously followed by Dr. [name] - he has decided that he will not have any further evaluation and there are no plans for surgical intervention. Not discussed today  6. Iron deficiency anemia/thrombocytopenia - [facility] - getting IV iron transfusions and now being treated for lymphoma   7. Looks to be jaundiced - no recent LFTs - INR is supra therapeutic. Not on statin. Not using Tylenol. Will check stat labs - may need CT scan - would hold on eye surgery. Probably needs to get back to primary care.   Current medicines are reviewed with the patient today.  The patient does not have concerns regarding medicines other than what has been noted above.  The following changes have been made:  See above.  Labs/ tests ordered today include:    Orders Placed This Encounter  Procedures  . Urinalysis  . Basic metabolic panel  . Hepatic function panel  . CBC with Differential/Platelet  . EKG 12-Lead     Disposition:   FU with me in 3 months. Fortunately, I think his cardiac status is ok but something else is going on - very worrisome. As he was leaving the office - he was able to provide a UA - very dark in color.   Patient is agreeable to this plan and will call if any problems develop in the interim.   SignedTruitt Merle, NP  10/22/2016 11:55 AM  [place] [address] [place], [place]  [address] Phone: [phone] Fax: [phone]

## 2016-10-22 NOTE — ED Notes (Signed)
Admitting MD at the bedside.  

## 2016-10-22 NOTE — Patient Instructions (Addendum)
We will be checking the following labs today - BMET, HPF, CBC with diff - all STAT and UA   Medication Instructions:    Continue with your current medicines.  No more coumadin until you go back on Friday to Dr. Alen Bleacher.      Testing/Procedures To Be Arranged:  N/A  Follow-Up:   See me in about 3 months with EKG    Other Special Instructions:   N/A    If you need a refill on your cardiac medications before your next appointment, please call your pharmacy.   Call the [place] office at [phone] if you have any questions, problems or concerns.

## 2016-10-22 NOTE — ED Triage Notes (Signed)
Pt sent her by his family Dr. Due to elevated liver enzymes and jaundice. Pt has no hx of liver problems. Pt has no complaints at this time.

## 2016-10-22 NOTE — Telephone Encounter (Signed)
Faxed stat results to Dr. Alen Bleacher office @ [phone]. Contacted pt to call Dr. Alen Bleacher office to see what PCP wants to do.

## 2016-10-22 NOTE — Telephone Encounter (Signed)
Faxing.

## 2016-10-23 ENCOUNTER — Inpatient Hospital Stay (HOSPITAL_COMMUNITY): Payer: PPO

## 2016-10-23 DIAGNOSIS — R791 Abnormal coagulation profile: Secondary | ICD-10-CM

## 2016-10-23 DIAGNOSIS — R634 Abnormal weight loss: Secondary | ICD-10-CM

## 2016-10-23 DIAGNOSIS — K210 Gastro-esophageal reflux disease with esophagitis: Secondary | ICD-10-CM

## 2016-10-23 LAB — CBC WITH DIFFERENTIAL/PLATELET
Basophils Absolute: 0 10*3/uL (ref 0.0–0.1)
Basophils Relative: 0 %
EOS PCT: 0 %
Eosinophils Absolute: 0 10*3/uL (ref 0.0–0.7)
HCT: 31.9 % — ABNORMAL LOW (ref 39.0–52.0)
Hemoglobin: 10.4 g/dL — ABNORMAL LOW (ref 13.0–17.0)
LYMPHS ABS: 1.8 10*3/uL (ref 0.7–4.0)
Lymphocytes Relative: 27 %
MCH: 31.3 pg (ref 26.0–34.0)
MCHC: 32.6 g/dL (ref 30.0–36.0)
MCV: 96.1 fL (ref 78.0–100.0)
MONOS PCT: 13 %
Monocytes Absolute: 0.8 10*3/uL (ref 0.1–1.0)
Neutro Abs: 3.9 10*3/uL (ref 1.7–7.7)
Neutrophils Relative %: 60 %
PLATELETS: 121 10*3/uL — AB (ref 150–400)
RBC: 3.32 MIL/uL — ABNORMAL LOW (ref 4.22–5.81)
RDW: 15.3 % (ref 11.5–15.5)
WBC: 6.6 10*3/uL (ref 4.0–10.5)

## 2016-10-23 LAB — COMPREHENSIVE METABOLIC PANEL
ALK PHOS: 387 U/L — AB (ref 38–126)
ALT: 116 U/L — ABNORMAL HIGH (ref 17–63)
ANION GAP: 8 (ref 5–15)
AST: 101 U/L — ABNORMAL HIGH (ref 15–41)
Albumin: 2.3 g/dL — ABNORMAL LOW (ref 3.5–5.0)
BUN: 18 mg/dL (ref 6–20)
CALCIUM: 8.5 mg/dL — AB (ref 8.9–10.3)
CHLORIDE: 102 mmol/L (ref 101–111)
CO2: 26 mmol/L (ref 22–32)
Creatinine, Ser: 1.28 mg/dL — ABNORMAL HIGH (ref 0.61–1.24)
GFR, EST AFRICAN AMERICAN: 58 mL/min — AB (ref >60)
GFR, EST NON AFRICAN AMERICAN: 50 mL/min — AB (ref >60)
Glucose, Bld: 96 mg/dL (ref 65–99)
Potassium: 4.5 mmol/L (ref 3.5–5.1)
SODIUM: 136 mmol/L (ref 135–145)
Total Bilirubin: 10.4 mg/dL — ABNORMAL HIGH (ref 0.3–1.2)
Total Protein: 5.3 g/dL — ABNORMAL LOW (ref 6.5–8.1)

## 2016-10-23 LAB — ABO/RH: ABO/RH(D): A POS

## 2016-10-23 LAB — PROTIME-INR
INR: 4.62 — AB
PROTHROMBIN TIME: 44.9 s — AB (ref 11.4–15.2)

## 2016-10-23 LAB — BILIRUBIN, DIRECT: BILIRUBIN DIRECT: 6.5 mg/dL — AB (ref 0.1–0.5)

## 2016-10-23 MED ORDER — FAMOTIDINE IN NACL 20-0.9 MG/50ML-% IV SOLN
20.0000 mg | Freq: Two times a day (BID) | INTRAVENOUS | Status: DC
  Administered 2016-10-23 (×2): 20 mg via INTRAVENOUS
  Filled 2016-10-23 (×3): qty 50

## 2016-10-23 MED ORDER — GADOBENATE DIMEGLUMINE 529 MG/ML IV SOLN
20.0000 mL | Freq: Once | INTRAVENOUS | Status: AC
  Administered 2016-10-23: 19 mL via INTRAVENOUS

## 2016-10-23 MED ORDER — VITAMIN K1 10 MG/ML IJ SOLN
10.0000 mg | Freq: Once | INTRAVENOUS | Status: AC
  Administered 2016-10-23: 10 mg via INTRAVENOUS
  Filled 2016-10-23: qty 1

## 2016-10-23 MED ORDER — ONDANSETRON HCL 4 MG/2ML IJ SOLN: 4.0000 mg | Freq: Four times a day (QID) | INTRAMUSCULAR | Status: DC | PRN

## 2016-10-23 MED ORDER — LORAZEPAM 2 MG/ML IJ SOLN
0.5000 mg | INTRAMUSCULAR | Status: DC | PRN
  Administered 2016-10-23: 0.5 mg via INTRAVENOUS
  Filled 2016-10-23: qty 1

## 2016-10-23 MED ORDER — SODIUM CHLORIDE 0.9 % IV SOLN
INTRAVENOUS | Status: DC
  Administered 2016-10-23: 10:00:00 via INTRAVENOUS
  Administered 2016-10-23 (×3): 1000 mL via INTRAVENOUS

## 2016-10-23 MED ORDER — ONDANSETRON HCL 4 MG PO TABS: 4.0000 mg | ORAL_TABLET | Freq: Four times a day (QID) | ORAL | Status: DC | PRN

## 2016-10-23 MED ORDER — PHYTONADIONE 5 MG PO TABS
5.0000 mg | ORAL_TABLET | Freq: Once | ORAL | Status: DC
  Filled 2016-10-23 (×2): qty 1

## 2016-10-23 MED ORDER — PANTOPRAZOLE SODIUM 40 MG PO TBEC
40.0000 mg | DELAYED_RELEASE_TABLET | Freq: Every day | ORAL | Status: DC
  Administered 2016-10-24 – 2016-11-01 (×8): 40 mg via ORAL
  Filled 2016-10-23 (×9): qty 1

## 2016-10-23 NOTE — Progress Notes (Signed)
[facility] PROGRESS NOTE  Jimmy Myers [id number] DOB: 1932-01-23 DOA: 10/22/2016   PCP: Kenn File, MD     Assessment/Plan: Principal Problem:   Jaundice Active Problems:   Chronic systolic heart failure (HCC)   Atrial fibrillation   HTN (hypertension), benign   Supratherapeutic INR   GERD (gastroesophageal reflux disease)   Anxiety   Jimmy Myers is a 83 y.o. male with medical history significant of chronic atrial fibrillation, anxiety, aortic root enlargement, osteoarthritis, history of atrial thrombus, lymphoma, chronic systolic congestive heart failure, diverticulosis, GERD, hemorrhoids, hypertension, history of pneumonia, recently treated for influenza who is being referred to the emergency department by his primary care provider due to progressively worse jaundice. CT scan abdomen/pelvis showed moderate intrahepatic biliary ductal dilation, mild distention of the gallbladder, distention of the CBD suspicious for CBD or pancreatic carcinoma. MRI MRCP recommended for further workup  Assessment and plan Obstructive Jaundice Secondary to possible carcinoma of the CBD or pancreatic head. Admitted to telemetry Keep nothing by mouth pending GI eval. Check MRI/MRCP , GI consult for possible ERCP, previously has seen [facility]. Requested them to see the patient    Follow-up CBC and CMP in a.m. Famotidine 20 mg IVPB every 12 hours. Zofran 4 mg every 6 hours as needed for nausea.        Chronic systolic heart failure (HCC) Compensated.EF previously down to 20 to 25% with improvement after medical therapy - up to 50% per echo in August of 2013 and was 45 to 50% by echo in July of 2014 continue to hold ACE inhibitor pending workup  atrial fibrillation Not on rate control agent. INR supratherapeutic  Like due to haptic coagulaopathy Continue cardiac monitoring. Monitor magnesium and potassium.    HTN (hypertension), benign Stable. Resume lisinopril once  cleared for oral intake. Monitor blood pressure. May need IV medications while nothing by mouth.    Supratherapeutic INR Received vitamin K yesterday. Monitor PT and INR daily.now 4.62    GERD (gastroesophageal reflux disease) Famotidine 20 mg IVPB every 12 hours    Anxiety Lorazepam 0.5 mg IVP on-call to MRI suite.      DVT prophylaxsis Lovenox once INR <2.0  Code Status:  Previously a DO NOT RESUSCITATE    Family Communication: Discussed in detail with the patient, all imaging results, lab results explained to the patient   Disposition Plan:  GI consult     Consultants:  GI  Procedures:  None  Antibiotics: Anti-infectives    None         HPI/Subjective: Denies nausea vomiting abdominal pain  Objective: Vitals:   10/22/16 2300 10/23/16 0012 10/23/16 0507 10/23/16 1022  BP: 109/70 128/61 (!) 108/56 (!) 127/97  Pulse: 90 71 65 66  Resp: 19 20 20 19   Temp:  99.8 F (37.7 C) 99.5 F (37.5 C) 98.2 F (36.8 C)  TempSrc:  Oral Oral Oral  SpO2: 97% 99% 99% 98%  Weight:  90.6 kg (199 lb 11.8 oz)    Height:  5\' 10"  (1.778 m)      Intake/Output Summary (Last 24 hours) at 10/23/16 1118 Last data filed at 10/23/16 0920  Gross per 24 hour  Intake              500 ml  Output              700 ml  Net             -200 ml  Exam:  Examination:  Cardiovascular: irregularly irregular, no murmurs / rubs / gallops. No extremity edema. 2+ pedal pulses. No carotid bruits.  Abdomen: Soft,no tenderness, no masses palpated. No hepatosplenomegaly. Bowel sounds positive.  Musculoskeletal: no clubbing / cyanosis. Good ROM, no contractures. Normal muscle tone.  Skin: Skin is jaundiced. Neurologic: CN 2-12 grossly intact. Sensation intact, DTR normal. Strength 5/5 in all 4.  Psychiatric: Normal judgment and insight. Alert and oriented x 3. Normal mood.     Data Reviewed: I have personally reviewed following labs and imaging studies  Micro Results No  results found for this or any previous visit (from the past 240 hour(s)).  Radiology Reports Ct Abdomen Pelvis W Contrast  Result Date: 10/22/2016 CLINICAL DATA:  83 y/o  M; elevated liver enzymes and jaundice. EXAM: CT ABDOMEN AND PELVIS WITH CONTRAST TECHNIQUE: Multidetector CT imaging of the abdomen and pelvis was performed using the standard protocol following bolus administration of intravenous contrast. CONTRAST:  157mL ISOVUE-300 IOPAMIDOL (ISOVUE-300) INJECTION 61% COMPARISON:  08/16/2012 CT of the abdomen and pelvis. FINDINGS: Lower chest: Moderate cardiomegaly. Aortic valvular calcification. No acute abnormality. Hepatobiliary: Moderate intrahepatic biliary ductal dilatation, mild distention of the gallbladder, a distention of the common bile duct up to 19 mm (series 5, image 69). The common bile duct is abruptly cut off as it enters the head of the pancreas with soft tissue filling the lumen. Two small gallstones within the gallbladder. Pancreas: No main duct dilatation of the pancreas. No inflammatory change. Spleen: Normal in size without focal abnormality. Adrenals/Urinary Tract: Lucencies in the kidneys the largest arising exophytic from the upper pole of right kidney measuring 41 mm, likely representing cysts. Normal adrenal glands. No urinary stone disease or obstructive uropathy. Normal bladder. Stomach/Bowel: Stomach is within normal limits. Appendix appears normal. No evidence of bowel wall thickening, distention, or inflammatory changes. Vascular/Lymphatic: Aortic atherosclerosis. No enlarged abdominal or pelvic lymph nodes. Reproductive: Mild prostate hypertrophy and calcifications. Other: No ascites. Musculoskeletal: No acute osseous abnormality. Multilevel degenerative changes of the spine with discogenic and prominent lower lumbar facet arthropathy. IMPRESSION: 1. Moderate intrahepatic biliary ductal dilatation, mild distention of the gallbladder, a distention of the common bile duct up  to 19 mm. The common bile duct is abruptly cut off as it enters the head of the pancreas with soft tissue filling the lumen. 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 MRI/MRCP is recommended with and without intravenous contrast for further evaluation. 2. Moderate cardiomegaly. 3. Cholelithiasis. 4. Aortic atherosclerosis. 5. Prostate hypertrophy. Electronically Signed   By: Kristine Garbe M.D.   On: 10/22/2016 22:29     CBC  Recent Labs Lab 10/21/16 1010 10/22/16 1232 10/23/16 0607  WBC 6.0 7.8 6.6  HGB  --   --  10.4*  HCT 35.9* 33.8* 31.9*  PLT 166 146* 121*  MCV 97 91 96.1  MCH 30.6 32.2 31.3  MCHC 31.5 35.5 32.6  RDW 14.6 14.3 15.3  LYMPHSABS 1.2 1.3 1.8  MONOABS  --   --  0.8  EOSABS 0.1 0.0 0.0  BASOSABS 0.0 0.0 0.0    Chemistries   Recent Labs Lab 10/22/16 1232 10/23/16 0607  NA 130* 136  K 5.1 4.5  CL 100 102  CO2 25 26  GLUCOSE 95 96  BUN 21 18  CREATININE 1.15 1.28*  CALCIUM 8.6 8.5*  AST 113* 101*  ALT 144* 116*  ALKPHOS 562* 387*  BILITOT 8.5* 10.4*   ------------------------------------------------------------------------------------------------------------------ estimated creatinine clearance  is 50.3 mL/min (by C-G formula based on SCr of 1.28 mg/dL (H)). ------------------------------------------------------------------------------------------------------------------ No results for input(s): HGBA1C in the last 72 hours. ------------------------------------------------------------------------------------------------------------------ No results for input(s): CHOL, HDL, LDLCALC, TRIG, CHOLHDL, LDLDIRECT in the last 72 hours. ------------------------------------------------------------------------------------------------------------------ No results for input(s): TSH, T4TOTAL, T3FREE, THYROIDAB in the last 72 hours.  Invalid input(s):  FREET3 ------------------------------------------------------------------------------------------------------------------ No results for input(s): VITAMINB12, FOLATE, FERRITIN, TIBC, IRON, RETICCTPCT in the last 72 hours.  Coagulation profile  Recent Labs Lab 10/21/16 1001 10/21/16 1010 10/21/16 1011 10/22/16 2105 10/23/16 0607  INR >8.0* 11.8* >8.0* 5.20* 4.62*    No results for input(s): DDIMER in the last 72 hours.  Cardiac Enzymes No results for input(s): CKMB, TROPONINI, MYOGLOBIN in the last 168 hours.  Invalid input(s): CK ------------------------------------------------------------------------------------------------------------------ Invalid input(s): POCBNP   CBG: No results for input(s): GLUCAP in the last 168 hours.     Studies: Ct Abdomen Pelvis W Contrast  Result Date: 10/22/2016 CLINICAL DATA:  83 y/o  M; elevated liver enzymes and jaundice. EXAM: CT ABDOMEN AND PELVIS WITH CONTRAST TECHNIQUE: Multidetector CT imaging of the abdomen and pelvis was performed using the standard protocol following bolus administration of intravenous contrast. CONTRAST:  170mL ISOVUE-300 IOPAMIDOL (ISOVUE-300) INJECTION 61% COMPARISON:  08/16/2012 CT of the abdomen and pelvis. FINDINGS: Lower chest: Moderate cardiomegaly. Aortic valvular calcification. No acute abnormality. Hepatobiliary: Moderate intrahepatic biliary ductal dilatation, mild distention of the gallbladder, a distention of the common bile duct up to 19 mm (series 5, image 69). The common bile duct is abruptly cut off as it enters the head of the pancreas with soft tissue filling the lumen. Two small gallstones within the gallbladder. Pancreas: No main duct dilatation of the pancreas. No inflammatory change. Spleen: Normal in size without focal abnormality. Adrenals/Urinary Tract: Lucencies in the kidneys the largest arising exophytic from the upper pole of right kidney measuring 41 mm, likely representing cysts. Normal  adrenal glands. No urinary stone disease or obstructive uropathy. Normal bladder. Stomach/Bowel: Stomach is within normal limits. Appendix appears normal. No evidence of bowel wall thickening, distention, or inflammatory changes. Vascular/Lymphatic: Aortic atherosclerosis. No enlarged abdominal or pelvic lymph nodes. Reproductive: Mild prostate hypertrophy and calcifications. Other: No ascites. Musculoskeletal: No acute osseous abnormality. Multilevel degenerative changes of the spine with discogenic and prominent lower lumbar facet arthropathy. IMPRESSION: 1. Moderate intrahepatic biliary ductal dilatation, mild distention of the gallbladder, a distention of the common bile duct up to 19 mm. The common bile duct is abruptly cut off as it enters the head of the pancreas with soft tissue filling the lumen. 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 MRI/MRCP is recommended with and without intravenous contrast for further evaluation. 2. Moderate cardiomegaly. 3. Cholelithiasis. 4. Aortic atherosclerosis. 5. Prostate hypertrophy. Electronically Signed   By: Kristine Garbe M.D.   On: 10/22/2016 22:29      No results found for: HGBA1C Lab Results  Component Value Date   LDLCALC 39 12/29/2011   CREATININE 1.28 (H) 10/23/2016       Scheduled Meds: . famotidine (PEPCID) IV  20 mg Intravenous Q12H   Continuous Infusions: . sodium chloride 100 mL/hr at 10/23/16 1028     LOS: 1 day    Time spent: >30 MINS    [facility] Pager [phone]. If 7PM-7AM, please contact night-coverage at [email or web address], password [facility] 10/23/2016, 11:18 AM  LOS: 1 day

## 2016-10-23 NOTE — Consult Note (Addendum)
[facility] Consult: 11:44 AM 10/23/2016  LOS: 1 day    Referring Provider: Dr Allyson Sabal  Primary Care Physician:  Kenn File, MD of [facility] Primary Gastroenterologist:  Dr. Fuller Plan    Reason for Consultation:  Obstructive Jaundice.     HPI: Jimmy Myers is a 83 y.o. male.  PMH chronic Coumadin for A fib.  Atrial thrombus.  Anxiety.  Wegener's Lymphoma.  CHF.  HTN.   2013 EGD after cardiologist unable to pass TEE scope. Dr Olevia Perches cleared esophagus of white secretions, otherwise normal study to D2.   .   04/2013 Colonoscopy.  For FOBT + and fam hx colon cancer.  3 mm sessile polyps (hyperplastic) x2.  Internal hemorrhoids.   RXd with Tamiflu for ? influenza, [place] for ? Evolving PNA started on 10/10/15.   At least 3 weeks of anorexia with at least a 5 pound weight loss in the last week. Night sweats for at least 3 weeks. Intermittent loose, nonbloody stools since mid January.  He and his wife started to notice jaundice over this past weekend, 5 days ago. At that time he also started seeing dark almost bloody-looking urine. He denies abdominal pain, nausea vomiting. No dysphagia. No heartburn. INR 11.8 on 2/20.  Rx with oral vitamin K at unkown dose in AM of 2/21.  Seen by cardiology NP 2/21, noted recent falls, weakness.  Not argumentative as is his norm.  Pt, wife reported the now obvious jaundice.  Patient doesn't consume alcohol. No history of liver problems. T bil 8.5.  Alk phos 562.  AST/ALT 113/144.  INT is 4.6.  Hgb 12 >> 10.4  Referred to ED for admission.   CT Ab pelvis: 19 mm CBD, abrupt cut off at head of pancreas, soft tissue fills lumen.  Suspicion for carcinoma.  Moderate intrahepatic biliary ductal dilatation, mild distention of the gallbladder.  Gallstones.  MRCP completed but not yet read.     Past  Medical History:  Diagnosis Date  . A-fib [facility]) May 2013   s/p TEE/cardioversion June 2013  . Anxiety   . Aortic root enlargement (HCC)    at least 5cm by CT  . Arthritis   . Atrial thrombus   . Bradycardia   . Bronchitis   . Cancer ([place]) 2016   lymphoma  . Chronic anticoagulation    on coumadin - checked in [place]  . Congestive heart failure (CHF) ([place]) 2013  . Diverticulosis   . Dysrhythmia    AFib  . GERD (gastroesophageal reflux disease)   . Hemorrhoids   . HTN (hypertension)   . Pneumonia May 2013  . Pneumonia 2013  . 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 Thrombocytopenia ([place])     Past Surgical History:  Procedure Laterality Date  . CARDIOVERSION  12/30/2011   Procedure: CARDIOVERSION;  Surgeon: Lelon Perla, MD;  Location: [place];  Service: Cardiovascular;  Laterality:  N/A;  . CARDIOVERSION  02/16/2012   Procedure: CARDIOVERSION;  Surgeon: Lelon Perla, MD;  Location: [facility] ENDOSCOPY;  Service: Cardiovascular;  Laterality: N/A;  . CATARACT EXTRACTION W/PHACO Left 07/28/2016   Procedure: CATARACT EXTRACTION PHACO AND INTRAOCULAR LENS PLACEMENT LEFT EYE:  CDE: 13.00;  Surgeon: Tonny Branch, MD;  Location: [facility];  Service: Ophthalmology;  Laterality: Left;  . ESOPHAGOGASTRODUODENOSCOPY  12/30/2011   Procedure: ESOPHAGOGASTRODUODENOSCOPY (EGD);  Surgeon: Lelon Perla, MD;  Location: [facility] ENDOSCOPY;  Service: Cardiovascular;  Laterality: N/A;  . ESOPHAGOGASTRODUODENOSCOPY  12/30/2011   Procedure: ESOPHAGOGASTRODUODENOSCOPY (EGD);  Surgeon: Lafayette Dragon, MD;  Location: [facility]COPY;  Service: Endoscopy;  Laterality: N/A;  . KNEE ARTHROSCOPY  rt knee  . PROSTATE SURGERY    . TEE WITHOUT CARDIOVERSION  12/30/2011   Procedure: TRANSESOPHAGEAL ECHOCARDIOGRAM (TEE);  Surgeon: Lelon Perla, MD;  Location: [facility] ENDOSCOPY;  Service:  Cardiovascular;  Laterality: N/A;  . TEE WITHOUT CARDIOVERSION  02/16/2012   Procedure: TRANSESOPHAGEAL ECHOCARDIOGRAM (TEE);  Surgeon: Lelon Perla, MD;  Location: [facility] ENDOSCOPY;  Service: Cardiovascular;  Laterality: N/A;    Prior to Admission medications   Medication Sig Start Date End Date Taking? Authorizing Provider  albuterol (PROVENTIL HFA;VENTOLIN HFA) 108 (90 BASE) MCG/ACT inhaler Inhale 2 puffs into the lungs every 6 (six) hours as needed for wheezing or shortness of breath.   Yes Historical Provider, MD  clorazepate (TRANXENE) 3.75 MG tablet Take 1.875-3.75 mg by mouth daily as needed for anxiety.    Yes Historical Provider, MD  Emollient (EUCERIN) lotion Apply 3-5 mLs topically daily.    Yes Historical Provider, MD  fluticasone (FLONASE) 50 MCG/ACT nasal spray Place 1 spray into both nostrils daily as needed for allergies or rhinitis.   Yes Historical Provider, MD  folic acid (FOLVITE) 209 MCG tablet Take 800 mcg by mouth daily.   Yes Historical Provider, MD  furosemide (LASIX) 40 MG tablet TAKE ONE TABLET BY MOUTH ONCE DAILY 09/26/16  Yes Burtis Junes, NP  guaiFENesin (MUCINEX) 600 MG 12 hr tablet Take 600 mg by mouth 2 (two) times daily as needed.   Yes Historical Provider, MD  lisinopril (PRINIVIL,ZESTRIL) 5 MG tablet TAKE ONE TABLET BY MOUTH ONCE DAILY Patient taking differently: TAKE ONE TABLET BY MOUTH ONCE DAILY AT 5PM 06/27/16  Yes Burtis Junes, NP  loperamide (IMODIUM A-D) 2 MG tablet Take 2 mg by mouth 4 (four) times daily as needed for diarrhea or loose stools.   Yes Historical Provider, MD  nitroGLYCERIN (NITROSTAT) 0.4 MG SL tablet Place 1 tablet (0.4 mg total) under the tongue every 5 (five) minutes x 3 doses as needed for chest pain. 05/15/14 10/06/17 Yes Burtis Junes, NP  omeprazole (PRILOSEC) 20 MG capsule Take 20 mg by mouth daily before breakfast.    Yes Historical Provider, MD  phytonadione (VITAMIN K) 5 MG tablet Take 5 mg by mouth once.   Yes Historical  Provider, MD  polyethylene glycol (MIRALAX / GLYCOLAX) packet Take 17 g by mouth at bedtime as needed.    Yes Historical Provider, MD  potassium chloride SA (K-DUR,KLOR-CON) 20 MEQ tablet Take 20 mEq by mouth 2 (two) times daily.   Yes Historical Provider, MD  warfarin (COUMADIN) 5 MG tablet Take as directed by coumadin clinic Patient taking differently: Take 2.5-5 mg by mouth See admin instructions. Take 2.[place]m [place] 520m daily on all other days of the week 07/13/13  Yes JaThompson GrayerMD  cefdinir (OMNICEF) 300  MG capsule Take 300 mg by mouth 2 (two) times daily. 10/09/16   Historical Provider, MD  oseltamivir (TAMIFLU) 75 MG capsule Take 75 mg by mouth 2 (two) times daily. 10/09/16   Historical Provider, MD    Scheduled Meds: . famotidine (PEPCID) IV  20 mg Intravenous Q12H  . phytonadione  5 mg Oral Once   Infusions: . sodium chloride 100 mL/hr at 10/23/16 1028   PRN Meds: LORazepam, ondansetron **OR** ondansetron (ZOFRAN) IV   Allergies as of 10/22/2016 - Review Complete 10/22/2016  Allergen Reaction Noted  . Codeine Other (See Comments) 12/28/2011  . Simvastatin Other (See Comments) 12/28/2011  . Vicodin [hydrocodone-acetaminophen] Other (See Comments) 12/28/2011    Family History  Problem Relation Age of Onset  . Heart disease Mother   . Heart disease Father   . Stomach cancer Brother   . Heart disease Sister   . Heart disease Brother   . Hypertension Sister     Social History   Social History  . Marital status: Married    Spouse name: N/A  . Number of children: 2  . Years of education: N/A   Occupational History  . retired Retired   Social History Main Topics  . Smoking status: Never Smoker  . Smokeless tobacco: Never Used  . Alcohol use No  . Drug use: No  . Sexual activity: Not Currently    Birth control/ protection: None   Other Topics Concern  . Not on file   Social History Narrative  . No narrative on file    REVIEW OF  SYSTEMS: Constitutional:  Fatigue, weakness and falls due to legs giving out from under him. ENT:  No nose bleeds Pulm:  Frothy, white cough. The respiratory issues he was having at the beginning of the month have mostly resolved. CV:  No palpitations, no LE edema. No chest pain GU:   Dark urine per HPI GI:  Per HPI Heme:  Bruises easily but has not been having unusual oral, dermatologic, urologic or GI bleeding.   Transfusions:  None in records Neuro:  No headaches, no peripheral tingling or numbness.  No syncope.  Pt is supposed to undergo cataract surgery on 2/26. Derm:  Intermittent pruritus, he doesn't think this is any worse now that it has been over many months Endocrine:  No sweats or chills.  No polyuria or dysuria  Immunization:  Vaccinated for influenza in 06/2016. Travel:  None beyond local counties in last few months.    PHYSICAL EXAM: Vital signs in last 24 hours: Vitals:   10/23/16 0507 10/23/16 1022  BP: (!) 108/56 (!) 127/97  Pulse: 65 66  Resp: 20 19  Temp: 99.5 F (37.5 C) 98.2 F (36.8 C)   Wt Readings from Last 3 Encounters:  10/23/16 90.6 kg (199 lb 11.8 oz)  10/22/16 92.1 kg (203 lb 1.9 oz)  10/09/16 92.6 kg (204 lb 3.2 oz)    General: Jaundiced, otherwise well appearing WM who is comfortable. Head:  No facial asymmetry or swelling. No signs of head trauma.  Eyes:  Scleral icterus, slightly pale conjunctiva. Ears:  HOH.  Nose:  No discharge or congestion. Mouth:  Moist, clear oral mucosa. Tongue midline. Neck:  No JVD, no masses, no thyromegaly. Lungs:  Clear bilaterally. No labored breathing or cough. Heart: RRR. S1, S2 present. Soft murmur Abdomen:  Not tender or distended. Active bowel sounds. No HSM, masses, bruits, hernias..   Rectal: Deferred   Musc/Skeltl: No joint erythema, swelling or gross  deformities. Extremities:  No CCE.  Neurologic:  Alert. Oriented times 3. Hard of hearing. No tremor or asterixis. Moves all 4 limbs, strength not  tested. Skin:  Jaundiced. No purpura or hematomas. Nodes:  No cervical or inguinal adenopathy.   Psych:  Pleasant, calm, cooperative. fair historian. Fluent speech.  Intake/Output from previous day: 02/21 0701 - 02/22 0700 In: 500 [I.V.:450; IV Piggyback:50] Out: 450 [Urine:450] Intake/Output this shift: Total I/O In: -  Out: 250 [Urine:250]  LAB RESULTS:  Recent Labs  10/21/16 1010 10/22/16 1232 10/23/16 0607  WBC 6.0 7.8 6.6  HGB  --   --  10.4*  HCT 35.9* 33.8* 31.9*  PLT 166 146* 121*   BMET Lab Results  Component Value Date   NA 136 10/23/2016   NA 130 (L) 10/22/2016   NA 138 07/22/2016   K 4.5 10/23/2016   K 5.1 10/22/2016   K 4.2 07/22/2016   CL 102 10/23/2016   CL 100 10/22/2016   CL 103 07/22/2016   CO2 26 10/23/2016   CO2 25 10/22/2016   CO2 28 07/22/2016   GLUCOSE 96 10/23/2016   GLUCOSE 95 10/22/2016   GLUCOSE 107 (H) 07/22/2016   BUN 18 10/23/2016   BUN 21 10/22/2016   BUN 20 07/22/2016   CREATININE 1.28 (H) 10/23/2016   CREATININE 1.15 10/22/2016   CREATININE 1.03 07/22/2016   CALCIUM 8.5 (L) 10/23/2016   CALCIUM 8.6 10/22/2016   CALCIUM 8.7 (L) 07/22/2016   LFT  Recent Labs  10/22/16 1232 10/23/16 0607 10/23/16 0733  PROT 5.3* 5.3*  --   ALBUMIN 3.4* 2.3*  --   AST 113* 101*  --   ALT 144* 116*  --   ALKPHOS 562* 387*  --   BILITOT 8.5* 10.4*  --   BILIDIR 5.49*  --  6.5*   PT/INR Lab Results  Component Value Date   INR 4.62 (HH) 10/23/2016   INR 5.20 (HH) 10/22/2016   INR >8.0 (>) 10/21/2016   Hepatitis Panel No results for input(s): HEPBSAG, HCVAB, HEPAIGM, HEPBIGM in the last 72 hours. C-Diff No components found for: CDIFF Lipase  No results found for: LIPASE  Drugs of Abuse  No results found for: LABOPIA, COCAINSCRNUR, LABBENZ, AMPHETMU, THCU, LABBARB   RADIOLOGY STUDIES: Ct Abdomen Pelvis W Contrast  Result Date: 10/22/2016 CLINICAL DATA:  83 y/o  M; elevated liver enzymes and jaundice. EXAM: CT ABDOMEN AND  PELVIS WITH CONTRAST TECHNIQUE: Multidetector CT imaging of the abdomen and pelvis was performed using the standard protocol following bolus administration of intravenous contrast. CONTRAST:  173m ISOVUE-300 IOPAMIDOL (ISOVUE-300) INJECTION 61% COMPARISON:  08/16/2012 CT of the abdomen and pelvis. FINDINGS: Lower chest: Moderate cardiomegaly. Aortic valvular calcification. No acute abnormality. Hepatobiliary: Moderate intrahepatic biliary ductal dilatation, mild distention of the gallbladder, a distention of the common bile duct up to 19 mm (series 5, image 69). The common bile duct is abruptly cut off as it enters the head of the pancreas with soft tissue filling the lumen. Two small gallstones within the gallbladder. Pancreas: No main duct dilatation of the pancreas. No inflammatory change. Spleen: Normal in size without focal abnormality. Adrenals/Urinary Tract: Lucencies in the kidneys the largest arising exophytic from the upper pole of right kidney measuring 41 mm, likely representing cysts. Normal adrenal glands. No urinary stone disease or obstructive uropathy. Normal bladder. Stomach/Bowel: Stomach is within normal limits. Appendix appears normal. No evidence of bowel wall thickening, distention, or inflammatory changes. Vascular/Lymphatic: Aortic atherosclerosis. No enlarged abdominal  or pelvic lymph nodes. Reproductive: Mild prostate hypertrophy and calcifications. Other: No ascites. Musculoskeletal: No acute osseous abnormality. Multilevel degenerative changes of the spine with discogenic and prominent lower lumbar facet arthropathy. IMPRESSION: 1. Moderate intrahepatic biliary ductal dilatation, mild distention of the gallbladder, a distention of the common bile duct up to 19 mm. The common bile duct is abruptly cut off as it enters the head of the pancreas with soft tissue filling the lumen. 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 MRI/MRCP is recommended with and  without intravenous contrast for further evaluation. 2. Moderate cardiomegaly. 3. Cholelithiasis. 4. Aortic atherosclerosis. 5. Prostate hypertrophy. Electronically Signed   By: Kristine Garbe M.D.   On: 10/22/2016 22:29    IMPRESSION:   *  Obstructive jaundice with ? cholangiocarcinoma vs pancreatic cancer.   *  Coagulopathy.  Chronic Coumadin for A fib, with INR >11 two days ago.  Dosed with oral Vitamin K yesterday.  Repeat dosing ordered, not yet given.   *   Thrombocytopenia, noncritical.  *  History Wegener's lymphoma  CHF  Weight loss   PLAN:     *  Needs ERCP once coags corrected.  Has slot at 11:15 on 2/23, if coags allow.    *  Await reading of MRCP though, regardless of findings, will need ERCP.  *  CA 19-9 ordered.  Ordered regular diet.  *  Ordered vitamin K 10 mg IV in place of po Vitamin K.     Azucena Freed  10/23/2016, 11:44 AM Pager: [phone]  I have reviewed the entire case in detail with the above APP and discussed the plan in detail.  Therefore, I agree with the diagnoses recorded above. In addition,  I have personally interviewed and examined the patient and have personally reviewed any abdominal/pelvic CT scan images.  My additional thoughts are as follows:  Obstructive jaundice - overall clinical scenario most worrisome for pancreatic or biliary malignancy.  Details of 2016 lymphoma diagnosis are unknown, but will keep in mind possibility of that having recurred to cause this.  ERCP tomorrow if INR allows.  Vitamin K given today CA 19-9 level pending.  Possibly needs outpatient EUS depending upon findings tomorrow. Wife and son present for entire encounter.  Patient agreeable to ERCP after discussion of BARS.  The benefits and risks of the planned procedure were described in detail with the patient or (when appropriate) their health care proxy.  Risks were outlined as including, but not limited to, bleeding, infection, perforation, adverse  medication reaction leading to cardiac or pulmonary decompensation, or pancreatitis (if ERCP).  The limitation of incomplete mucosal visualization was also discussed.  No guarantees or warranties were given.  Patient at increased risk for cardiopulmonary complications of procedure due to medical comorbidities.   Nelida Meuse III Pager [phone]  Mon-Fri 8a-5p [phone] after 5p, weekends, holidays

## 2016-10-24 ENCOUNTER — Inpatient Hospital Stay (HOSPITAL_COMMUNITY): Payer: PPO | Admitting: Certified Registered Nurse Anesthetist

## 2016-10-24 ENCOUNTER — Encounter: Payer: Self-pay | Admitting: Pharmacist

## 2016-10-24 ENCOUNTER — Encounter (HOSPITAL_COMMUNITY): Payer: Self-pay | Admitting: *Deleted

## 2016-10-24 ENCOUNTER — Inpatient Hospital Stay (HOSPITAL_COMMUNITY): Payer: PPO

## 2016-10-24 ENCOUNTER — Encounter (HOSPITAL_COMMUNITY)
Admission: EM | Disposition: A | Discharge status: Skilled Nursing Facility | Payer: Self-pay | Source: Home / Self Care | Attending: Nephrology

## 2016-10-24 DIAGNOSIS — I480 Paroxysmal atrial fibrillation: Secondary | ICD-10-CM

## 2016-10-24 DIAGNOSIS — K831 Obstruction of bile duct: Secondary | ICD-10-CM

## 2016-10-24 HISTORY — PX: ENDOSCOPIC RETROGRADE CHOLANGIOPANCREATOGRAPHY (ERCP) WITH PROPOFOL: SHX5810

## 2016-10-24 LAB — COMPREHENSIVE METABOLIC PANEL
ALBUMIN: 1.9 g/dL — AB (ref 3.5–5.0)
ALK PHOS: 346 U/L — AB (ref 38–126)
ALT: 99 U/L — AB (ref 17–63)
ANION GAP: 7 (ref 5–15)
AST: 91 U/L — ABNORMAL HIGH (ref 15–41)
BUN: 22 mg/dL — ABNORMAL HIGH (ref 6–20)
CALCIUM: 7.8 mg/dL — AB (ref 8.9–10.3)
CHLORIDE: 108 mmol/L (ref 101–111)
CO2: 22 mmol/L (ref 22–32)
Creatinine, Ser: 1.22 mg/dL (ref 0.61–1.24)
GFR calc Af Amer: >60 mL/min (ref >60)
GFR calc non Af Amer: 53 mL/min — ABNORMAL LOW (ref >60)
GLUCOSE: 98 mg/dL (ref 65–99)
Potassium: 4 mmol/L (ref 3.5–5.1)
SODIUM: 137 mmol/L (ref 135–145)
Total Bilirubin: 13.1 mg/dL — ABNORMAL HIGH (ref 0.3–1.2)
Total Protein: 4.7 g/dL — ABNORMAL LOW (ref 6.5–8.1)

## 2016-10-24 LAB — CBC WITH DIFFERENTIAL/PLATELET
Basophils Absolute: 0 10*3/uL (ref 0.0–0.1)
Basophils Relative: 0 %
EOS ABS: 0 10*3/uL (ref 0.0–0.7)
Eosinophils Relative: 1 %
HCT: 29.1 % — ABNORMAL LOW (ref 39.0–52.0)
HEMOGLOBIN: 9.7 g/dL — AB (ref 13.0–17.0)
Lymphocytes Relative: 19 %
Lymphs Abs: 1.3 10*3/uL (ref 0.7–4.0)
MCH: 31.3 pg (ref 26.0–34.0)
MCHC: 33.3 g/dL (ref 30.0–36.0)
MCV: 93.9 fL (ref 78.0–100.0)
Monocytes Absolute: 0.9 10*3/uL (ref 0.1–1.0)
Monocytes Relative: 13 %
NEUTROS PCT: 68 %
Neutro Abs: 4.7 10*3/uL (ref 1.7–7.7)
PLATELETS: 104 10*3/uL — AB (ref 150–400)
RBC: 3.1 MIL/uL — AB (ref 4.22–5.81)
RDW: 15.2 % (ref 11.5–15.5)
WBC: 6.9 10*3/uL (ref 4.0–10.5)

## 2016-10-24 LAB — CANCER ANTIGEN 19-9: CA 19-9: 1581 U/mL — ABNORMAL HIGH (ref 0–35)

## 2016-10-24 LAB — PROTIME-INR
INR: 1.25
PROTHROMBIN TIME: 15.7 s — AB (ref 11.4–15.2)

## 2016-10-24 SURGERY — ENDOSCOPIC RETROGRADE CHOLANGIOPANCREATOGRAPHY (ERCP) WITH PROPOFOL
Anesthesia: General

## 2016-10-24 MED ORDER — LIDOCAINE HCL (CARDIAC) 20 MG/ML IV SOLN
INTRAVENOUS | Status: DC | PRN
  Administered 2016-10-24: 60 mg via INTRAVENOUS

## 2016-10-24 MED ORDER — SODIUM CHLORIDE 0.9 % IV SOLN
3.0000 g | Freq: Four times a day (QID) | INTRAVENOUS | Status: AC
  Administered 2016-10-24 (×2): 3 g via INTRAVENOUS
  Filled 2016-10-24 (×2): qty 3

## 2016-10-24 MED ORDER — PHENYLEPHRINE HCL 10 MG/ML IJ SOLN
INTRAMUSCULAR | Status: DC | PRN
  Administered 2016-10-24: 80 ug via INTRAVENOUS

## 2016-10-24 MED ORDER — ONDANSETRON HCL 4 MG/2ML IJ SOLN
INTRAMUSCULAR | Status: DC | PRN
  Administered 2016-10-24 (×2): 4 mg via INTRAVENOUS

## 2016-10-24 MED ORDER — INDOMETHACIN 50 MG RE SUPP
RECTAL | Status: DC | PRN
  Administered 2016-10-24: 100 mg via RECTAL

## 2016-10-24 MED ORDER — GLUCAGON HCL RDNA (DIAGNOSTIC) 1 MG IJ SOLR
INTRAMUSCULAR | Status: AC
  Filled 2016-10-24: qty 1

## 2016-10-24 MED ORDER — SODIUM CHLORIDE 0.9 % IV SOLN
3.0000 g | Freq: Once | INTRAVENOUS | Status: AC
  Administered 2016-10-24: 3 g via INTRAVENOUS
  Filled 2016-10-24: qty 3

## 2016-10-24 MED ORDER — SODIUM CHLORIDE 0.9 % IV SOLN
INTRAVENOUS | Status: DC | PRN
  Administered 2016-10-24: 12:00:00 via INTRAVENOUS

## 2016-10-24 MED ORDER — SUCCINYLCHOLINE CHLORIDE 20 MG/ML IJ SOLN
INTRAMUSCULAR | Status: DC | PRN
  Administered 2016-10-24: 100 mg via INTRAVENOUS

## 2016-10-24 MED ORDER — DEXAMETHASONE SODIUM PHOSPHATE 10 MG/ML IJ SOLN
INTRAMUSCULAR | Status: DC | PRN
  Administered 2016-10-24: 10 mg via INTRAVENOUS

## 2016-10-24 MED ORDER — LACTATED RINGERS IV SOLN
INTRAVENOUS | Status: DC | PRN
  Administered 2016-10-24 (×2): via INTRAVENOUS

## 2016-10-24 MED ORDER — SUGAMMADEX SODIUM 200 MG/2ML IV SOLN
INTRAVENOUS | Status: DC | PRN
  Administered 2016-10-24: 200 mg via INTRAVENOUS

## 2016-10-24 MED ORDER — INDOMETHACIN 50 MG RE SUPP: 100.0000 mg | Freq: Once | RECTAL | Status: DC

## 2016-10-24 MED ORDER — FENTANYL CITRATE (PF) 100 MCG/2ML IJ SOLN
INTRAMUSCULAR | Status: DC | PRN
  Administered 2016-10-24: 50 ug via INTRAVENOUS

## 2016-10-24 MED ORDER — SODIUM CHLORIDE 0.9 % IV SOLN
INTRAVENOUS | Status: DC | PRN
  Administered 2016-10-24: 10 mL

## 2016-10-24 MED ORDER — ROCURONIUM BROMIDE 100 MG/10ML IV SOLN
INTRAVENOUS | Status: DC | PRN
  Administered 2016-10-24: 30 mg via INTRAVENOUS
  Administered 2016-10-24 (×2): 10 mg via INTRAVENOUS

## 2016-10-24 MED ORDER — INDOMETHACIN 50 MG RE SUPP
RECTAL | Status: AC
  Filled 2016-10-24: qty 2

## 2016-10-24 MED ORDER — DEXTROSE 5 % IV SOLN
INTRAVENOUS | Status: DC | PRN
  Administered 2016-10-24: 10 ug/min via INTRAVENOUS

## 2016-10-24 MED ORDER — IOPAMIDOL (ISOVUE-300) INJECTION 61%
INTRAVENOUS | Status: AC
  Filled 2016-10-24: qty 50

## 2016-10-24 MED ORDER — PROPOFOL 10 MG/ML IV BOLUS
INTRAVENOUS | Status: DC | PRN
  Administered 2016-10-24: 150 mg via INTRAVENOUS

## 2016-10-24 NOTE — Progress Notes (Addendum)
[facility] PROGRESS NOTE  Jimmy Myers DXA:[id number] DOB: Mar 17, 1932 DOA: 10/22/2016   PCP: Kenn File, MD     Assessment/Plan: Principal Problem:   Jaundice Active Problems:   Chronic systolic heart failure (HCC)   Atrial fibrillation   HTN (hypertension), benign   Supratherapeutic INR   GERD (gastroesophageal reflux disease)   Anxiety   Jimmy Myers is a 83 y.o. male with medical history significant of chronic atrial fibrillation, anxiety, aortic root enlargement, osteoarthritis, history of atrial thrombus, lymphoma, chronic systolic congestive heart failure, diverticulosis, GERD, hemorrhoids, hypertension, history of pneumonia, recently treated for influenza who is being referred to the emergency department by his primary care provider due to progressively worse jaundice. CT scan abdomen/pelvis showed moderate intrahepatic biliary ductal dilation, mild distention of the gallbladder, distention of the CBD suspicious for CBD or pancreatic carcinoma. MRI MRCP recommended for further workup   Assessment and plan Obstructive Jaundice worrisome for cholangiocarcinoma vs pancreatic cancer GI consulted  for   ERCP,  Famotidine 20 mg IVPB every 12 hours. Zofran 4 mg every 6 hours as needed for nausea. [id number], markedly elevated  GI unable to successfully place stent ,IR consult for PTC and drain and/or stent  Now on cardiac diet     Chronic systolic heart failure ([place]) Compensated.EF previously down to 20 to 25% with improvement after medical therapy - up to 50% per echo in August of 2013 and was 45 to 50% by echo in July of 2014 continue to hold ACE inhibitor pending workup  atrial fibrillation Not on rate control agent. INR supratherapeutic  Like due to haptic coagulaopathy, normalized with vitamin K Would need to consider risk versus benefit of long-term anticoagulation,cards contacted to help with this decison     HTN (hypertension),  benign Stable. Resume lisinopril once workup is completed Monitor blood pressure. May need IV medications while nothing by mouth.    Supratherapeutic INR Received vitamin K yesterday. Monitor PT and INR daily.now 4.62>1.25    GERD (gastroesophageal reflux disease) Famotidine 20 mg IVPB every 12 hours    Anxiety Lorazepam 0.5 mg prn       DVT prophylaxsis Lovenox once INR <2.0  Code Status:  Previously a DO NOT RESUSCITATE    Family Communication: Discussed in detail with the patient/wife, all imaging results, lab results explained to the patient   Disposition Plan:  GI consult     Consultants:  GI  cards  Procedures:  None  Antibiotics: Anti-infectives    Start     Dose/Rate Route Frequency Ordered Stop   10/24/16 1030  Ampicillin-Sulbactam (UNASYN) 3 g in sodium chloride 0.9 % 100 mL IVPB     3 g 200 mL/hr over 30 Minutes Intravenous  Once 10/24/16 0719           HPI/Subjective: Denies nausea vomiting abdominal pain, anxious about ERCP  Objective: Vitals:   10/23/16 1351 10/23/16 1756 10/23/16 2039 10/24/16 0528  BP: 124/63 135/60 123/61 (!) 104/51  Pulse: (!) 57 64 73 74  Resp: _0 Temp: 98.1 F (36.7 C) 98.2 F (36.8 C) 98.9 F (37.2 C) 98 F (36.7 C)  TempSrc: Oral Oral Oral Oral  SpO2: 99% 97% 98% 98%  Weight:   91.7 kg (202 lb 2.6 oz)   Height:        Intake/Output Summary (Last 24 hours) at 10/24/16 0843 Last data filed at 10/24/16 0529  Gross per 24 hour  Intake  2708.33 ml  Output              700 ml  Net          2008.33 ml    Exam:  Examination:  Cardiovascular: irregularly irregular, no murmurs / rubs / gallops. No extremity edema. 2+ pedal pulses. No carotid bruits.  Abdomen: Soft,no tenderness, no masses palpated. No hepatosplenomegaly. Bowel sounds positive.  Musculoskeletal: no clubbing / cyanosis. Good ROM, no contractures. Normal muscle tone.  Skin: Skin is jaundiced. Neurologic: CN 2-12  grossly intact. Sensation intact, DTR normal. Strength 5/5 in all 4.  Psychiatric: Normal judgment and insight. Alert and oriented x 3. Normal mood.     Data Reviewed: I have personally reviewed following labs and imaging studies  Micro Results No results found for this or any previous visit (from the past 240 hour(s)).  Radiology Reports Ct Abdomen Pelvis W Contrast  Result Date: 10/22/2016 CLINICAL DATA:  83 y/o  M; elevated liver enzymes and jaundice. EXAM: CT ABDOMEN AND PELVIS WITH CONTRAST TECHNIQUE: Multidetector CT imaging of the abdomen and pelvis was performed using the standard protocol following bolus administration of intravenous contrast. CONTRAST:  161m ISOVUE-300 IOPAMIDOL (ISOVUE-300) INJECTION 61% COMPARISON:  08/16/2012 CT of the abdomen and pelvis. FINDINGS: Lower chest: Moderate cardiomegaly. Aortic valvular calcification. No acute abnormality. Hepatobiliary: Moderate intrahepatic biliary ductal dilatation, mild distention of the gallbladder, a distention of the common bile duct up to 19 mm (series 5, image 69). The common bile duct is abruptly cut off as it enters the head of the pancreas with soft tissue filling the lumen. Two small gallstones within the gallbladder. Pancreas: No main duct dilatation of the pancreas. No inflammatory change. Spleen: Normal in size without focal abnormality. Adrenals/Urinary Tract: Lucencies in the kidneys the largest arising exophytic from the upper pole of right kidney measuring 41 mm, likely representing cysts. Normal adrenal glands. No urinary stone disease or obstructive uropathy. Normal bladder. Stomach/Bowel: Stomach is within normal limits. Appendix appears normal. No evidence of bowel wall thickening, distention, or inflammatory changes. Vascular/Lymphatic: Aortic atherosclerosis. No enlarged abdominal or pelvic lymph nodes. Reproductive: Mild prostate hypertrophy and calcifications. Other: No ascites. Musculoskeletal: No acute osseous  abnormality. Multilevel degenerative changes of the spine with discogenic and prominent lower lumbar facet arthropathy. IMPRESSION: 1. Moderate intrahepatic biliary ductal dilatation, mild distention of the gallbladder, a distention of the common bile duct up to 19 mm. The common bile duct is abruptly cut off as it enters the head of the pancreas with soft tissue filling the lumen. 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 MRI/MRCP is recommended with and without intravenous contrast for further evaluation. 2. Moderate cardiomegaly. 3. Cholelithiasis. 4. Aortic atherosclerosis. 5. Prostate hypertrophy. Electronically Signed   By: LKristine GarbeM.D.   On: 10/22/2016 22:29   Mr 3d Recon At Scanner  Result Date: 10/23/2016 CLINICAL DATA:  83year old male with history of progressively worsening jaundice. No associated fever, abdominal pain, nausea, emesis, constipation, melena or hematochezia. Biliary tract ductal dilatation noted on recent CT examination. EXAM: MRI ABDOMEN WITHOUT AND WITH CONTRAST (INCLUDING MRCP) TECHNIQUE: Multiplanar multisequence MR imaging of the abdomen was performed both before and after the administration of intravenous contrast. Heavily T2-weighted images of the biliary and pancreatic ducts were obtained, and three-dimensional MRCP images were rendered by post processing. CONTRAST:  172mMULTIHANCE GADOBENATE DIMEGLUMINE 529 MG/ML IV SOLN COMPARISON:  No priors.  CT of the abdomen and pelvis 10/22/2016. FINDINGS: Lower chest:  Cardiomegaly. Hepatobiliary: No discrete cystic or solid hepatic lesions. MRCP images demonstrate moderate intra and extrahepatic biliary ductal dilatation which abruptly terminates at or just beyond the junction of the common hepatic and cystic ducts in the proximal common bile duct. The common hepatic duct measures up to 14 mm in diameter. The area of obstruction on MRCP images is poorly demonstrated secondary to  central loss of signal intensity on MRCP images. Unfortunately, this region is also poorly evaluated on postcontrast imaging secondary to patient motion. No discrete mass is identified in this region. Gallbladder is moderately distended. Several tiny filling defects are noted in the gallbladder, compatible with tiny gallstones. Gallbladder wall does not appear thickened and there is no pericholecystic fluid or surrounding inflammatory changes to suggest an acute cholecystitis at this time. Pancreas: No discrete pancreatic mass identified in the pancreatic head to account for the common bile duct obstruction. There are some tiny T1 hypointense, T2 hyperintense, nonenhancing lesions in the distal body and tail of the pancreas, measuring up to 1 cm in diameter, presumably tiny pancreatic pseudocysts. No pancreatic ductal dilatation noted on MRCP images. No peripancreatic inflammatory changes. Spleen:  Unremarkable. Adrenals/Urinary Tract: Well-defined T1 hypointense, T2 hyperintense, nonenhancing lesions are noted in both kidneys, the largest of which is exophytic extending from the upper pole of the right kidney measuring up to 5.4 cm in diameter. No hydroureteronephrosis in the visualized portions of the abdomen. Bilateral adrenal glands are normal in appearance. Stomach/Bowel: Visualized portions are unremarkable. Vascular/Lymphatic: Aortic atherosclerosis, without definite aneurysm in the abdominal vasculature. No lymphadenopathy noted in the abdomen. Other: No significant volume of ascites noted in the visualized portions of the peritoneal cavity. Musculoskeletal: No aggressive osseous lesions are noted in the visualized portions of the skeleton. IMPRESSION: 1. Abrupt cut off of the common bile duct at or immediately beyond the junction of the common hepatic duct and cystic duct with moderate proximal biliary dilatation. The exact source of obstruction is unclear on today's examination, with differential  considerations including an obstructing ductal stone (which is simply not visualized secondary to technical limitations on today's examination), ductal adenocarcinoma, or obstructing pancreatic head lesion (which is not visualized on today's examination secondary to technical limitations). Further evaluation with ERCP is recommended if clinically appropriate. 2. Cholelithiasis without evidence of acute cholecystitis at this time. Electronically Signed   By: Vinnie Langton M.D.   On: 10/23/2016 14:37   Mr Abdomen Mrcp Moise Boring Contast  Result Date: 10/23/2016 CLINICAL DATA:  83 year old male with history of progressively worsening jaundice. No associated fever, abdominal pain, nausea, emesis, constipation, melena or hematochezia. Biliary tract ductal dilatation noted on recent CT examination. EXAM: MRI ABDOMEN WITHOUT AND WITH CONTRAST (INCLUDING MRCP) TECHNIQUE: Multiplanar multisequence MR imaging of the abdomen was performed both before and after the administration of intravenous contrast. Heavily T2-weighted images of the biliary and pancreatic ducts were obtained, and three-dimensional MRCP images were rendered by post processing. CONTRAST:  2m MULTIHANCE GADOBENATE DIMEGLUMINE 529 MG/ML IV SOLN COMPARISON:  No priors.  CT of the abdomen and pelvis 10/22/2016. FINDINGS: Lower chest: Cardiomegaly. Hepatobiliary: No discrete cystic or solid hepatic lesions. MRCP images demonstrate moderate intra and extrahepatic biliary ductal dilatation which abruptly terminates at or just beyond the junction of the common hepatic and cystic ducts in the proximal common bile duct. The common hepatic duct measures up to 14 mm in diameter. The area of obstruction on MRCP images is poorly demonstrated secondary to central loss of signal intensity on MRCP images.  Unfortunately, this region is also poorly evaluated on postcontrast imaging secondary to patient motion. No discrete mass is identified in this region. Gallbladder is  moderately distended. Several tiny filling defects are noted in the gallbladder, compatible with tiny gallstones. Gallbladder wall does not appear thickened and there is no pericholecystic fluid or surrounding inflammatory changes to suggest an acute cholecystitis at this time. Pancreas: No discrete pancreatic mass identified in the pancreatic head to account for the common bile duct obstruction. There are some tiny T1 hypointense, T2 hyperintense, nonenhancing lesions in the distal body and tail of the pancreas, measuring up to 1 cm in diameter, presumably tiny pancreatic pseudocysts. No pancreatic ductal dilatation noted on MRCP images. No peripancreatic inflammatory changes. Spleen:  Unremarkable. Adrenals/Urinary Tract: Well-defined T1 hypointense, T2 hyperintense, nonenhancing lesions are noted in both kidneys, the largest of which is exophytic extending from the upper pole of the right kidney measuring up to 5.4 cm in diameter. No hydroureteronephrosis in the visualized portions of the abdomen. Bilateral adrenal glands are normal in appearance. Stomach/Bowel: Visualized portions are unremarkable. Vascular/Lymphatic: Aortic atherosclerosis, without definite aneurysm in the abdominal vasculature. No lymphadenopathy noted in the abdomen. Other: No significant volume of ascites noted in the visualized portions of the peritoneal cavity. Musculoskeletal: No aggressive osseous lesions are noted in the visualized portions of the skeleton. IMPRESSION: 1. Abrupt cut off of the common bile duct at or immediately beyond the junction of the common hepatic duct and cystic duct with moderate proximal biliary dilatation. The exact source of obstruction is unclear on today's examination, with differential considerations including an obstructing ductal stone (which is simply not visualized secondary to technical limitations on today's examination), ductal adenocarcinoma, or obstructing pancreatic head lesion (which is not  visualized on today's examination secondary to technical limitations). Further evaluation with ERCP is recommended if clinically appropriate. 2. Cholelithiasis without evidence of acute cholecystitis at this time. Electronically Signed   By: Vinnie Langton M.D.   On: 10/23/2016 14:37     CBC  Recent Labs Lab 10/21/16 1010 10/22/16 1232 10/23/16 0607  WBC 6.0 7.8 6.6  HGB  --   --  10.4*  HCT 35.9* 33.8* 31.9*  PLT 166 146* 121*  MCV 97 91 96.1  MCH 30.6 32.2 31.3  MCHC 31.5 35.5 32.6  RDW 14.6 14.3 15.3  LYMPHSABS 1.2 1.3 1.8  MONOABS  --   --  0.8  EOSABS 0.1 0.0 0.0  BASOSABS 0.0 0.0 0.0    Chemistries   Recent Labs Lab 10/22/16 1232 10/23/16 0607  NA 130* 136  K 5.1 4.5  CL 100 102  CO2 25 26  GLUCOSE 95 96  BUN 21 18  CREATININE 1.15 1.28*  CALCIUM 8.6 8.5*  AST 113* 101*  ALT 144* 116*  ALKPHOS 562* 387*  BILITOT 8.5* 10.4*   ------------------------------------------------------------------------------------------------------------------ estimated creatinine clearance is 50.7 mL/min (by C-G formula based on SCr of 1.28 mg/dL (H)). ------------------------------------------------------------------------------------------------------------------ No results for input(s): HGBA1C in the last 72 hours. ------------------------------------------------------------------------------------------------------------------ No results for input(s): CHOL, HDL, LDLCALC, TRIG, CHOLHDL, LDLDIRECT in the last 72 hours. ------------------------------------------------------------------------------------------------------------------ No results for input(s): TSH, T4TOTAL, T3FREE, THYROIDAB in the last 72 hours.  Invalid input(s): FREET3 ------------------------------------------------------------------------------------------------------------------ No results for input(s): VITAMINB12, FOLATE, FERRITIN, TIBC, IRON, RETICCTPCT in the last 72 hours.  Coagulation  profile  Recent Labs Lab 10/21/16 1010 10/21/16 1011 10/22/16 2105 10/23/16 0607 10/24/16 0731  INR 11.8* >8.0* 5.20* 4.62* 1.25    No results for input(s): DDIMER in the last  72 hours.  Cardiac Enzymes No results for input(s): CKMB, TROPONINI, MYOGLOBIN in the last 168 hours.  Invalid input(s): CK ------------------------------------------------------------------------------------------------------------------ Invalid input(s): POCBNP   CBG: No results for input(s): GLUCAP in the last 168 hours.     Studies: Ct Abdomen Pelvis W Contrast  Result Date: 10/22/2016 CLINICAL DATA:  83 y/o  M; elevated liver enzymes and jaundice. EXAM: CT ABDOMEN AND PELVIS WITH CONTRAST TECHNIQUE: Multidetector CT imaging of the abdomen and pelvis was performed using the standard protocol following bolus administration of intravenous contrast. CONTRAST:  114m ISOVUE-300 IOPAMIDOL (ISOVUE-300) INJECTION 61% COMPARISON:  08/16/2012 CT of the abdomen and pelvis. FINDINGS: Lower chest: Moderate cardiomegaly. Aortic valvular calcification. No acute abnormality. Hepatobiliary: Moderate intrahepatic biliary ductal dilatation, mild distention of the gallbladder, a distention of the common bile duct up to 19 mm (series 5, image 69). The common bile duct is abruptly cut off as it enters the head of the pancreas with soft tissue filling the lumen. Two small gallstones within the gallbladder. Pancreas: No main duct dilatation of the pancreas. No inflammatory change. Spleen: Normal in size without focal abnormality. Adrenals/Urinary Tract: Lucencies in the kidneys the largest arising exophytic from the upper pole of right kidney measuring 41 mm, likely representing cysts. Normal adrenal glands. No urinary stone disease or obstructive uropathy. Normal bladder. Stomach/Bowel: Stomach is within normal limits. Appendix appears normal. No evidence of bowel wall thickening, distention, or inflammatory changes.  Vascular/Lymphatic: Aortic atherosclerosis. No enlarged abdominal or pelvic lymph nodes. Reproductive: Mild prostate hypertrophy and calcifications. Other: No ascites. Musculoskeletal: No acute osseous abnormality. Multilevel degenerative changes of the spine with discogenic and prominent lower lumbar facet arthropathy. IMPRESSION: 1. Moderate intrahepatic biliary ductal dilatation, mild distention of the gallbladder, a distention of the common bile duct up to 19 mm. The common bile duct is abruptly cut off as it enters the head of the pancreas with soft tissue filling the lumen. 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 MRI/MRCP is recommended with and without intravenous contrast for further evaluation. 2. Moderate cardiomegaly. 3. Cholelithiasis. 4. Aortic atherosclerosis. 5. Prostate hypertrophy. Electronically Signed   By: LKristine GarbeM.D.   On: 10/22/2016 22:29   Mr 3d Recon At Scanner  Result Date: 10/23/2016 CLINICAL DATA:  83year old male with history of progressively worsening jaundice. No associated fever, abdominal pain, nausea, emesis, constipation, melena or hematochezia. Biliary tract ductal dilatation noted on recent CT examination. EXAM: MRI ABDOMEN WITHOUT AND WITH CONTRAST (INCLUDING MRCP) TECHNIQUE: Multiplanar multisequence MR imaging of the abdomen was performed both before and after the administration of intravenous contrast. Heavily T2-weighted images of the biliary and pancreatic ducts were obtained, and three-dimensional MRCP images were rendered by post processing. CONTRAST:  140mMULTIHANCE GADOBENATE DIMEGLUMINE 529 MG/ML IV SOLN COMPARISON:  No priors.  CT of the abdomen and pelvis 10/22/2016. FINDINGS: Lower chest: Cardiomegaly. Hepatobiliary: No discrete cystic or solid hepatic lesions. MRCP images demonstrate moderate intra and extrahepatic biliary ductal dilatation which abruptly terminates at or just beyond the junction of the  common hepatic and cystic ducts in the proximal common bile duct. The common hepatic duct measures up to 14 mm in diameter. The area of obstruction on MRCP images is poorly demonstrated secondary to central loss of signal intensity on MRCP images. Unfortunately, this region is also poorly evaluated on postcontrast imaging secondary to patient motion. No discrete mass is identified in this region. Gallbladder is moderately distended. Several tiny filling defects are noted in the  gallbladder, compatible with tiny gallstones. Gallbladder wall does not appear thickened and there is no pericholecystic fluid or surrounding inflammatory changes to suggest an acute cholecystitis at this time. Pancreas: No discrete pancreatic mass identified in the pancreatic head to account for the common bile duct obstruction. There are some tiny T1 hypointense, T2 hyperintense, nonenhancing lesions in the distal body and tail of the pancreas, measuring up to 1 cm in diameter, presumably tiny pancreatic pseudocysts. No pancreatic ductal dilatation noted on MRCP images. No peripancreatic inflammatory changes. Spleen:  Unremarkable. Adrenals/Urinary Tract: Well-defined T1 hypointense, T2 hyperintense, nonenhancing lesions are noted in both kidneys, the largest of which is exophytic extending from the upper pole of the right kidney measuring up to 5.4 cm in diameter. No hydroureteronephrosis in the visualized portions of the abdomen. Bilateral adrenal glands are normal in appearance. Stomach/Bowel: Visualized portions are unremarkable. Vascular/Lymphatic: Aortic atherosclerosis, without definite aneurysm in the abdominal vasculature. No lymphadenopathy noted in the abdomen. Other: No significant volume of ascites noted in the visualized portions of the peritoneal cavity. Musculoskeletal: No aggressive osseous lesions are noted in the visualized portions of the skeleton. IMPRESSION: 1. Abrupt cut off of the common bile duct at or immediately  beyond the junction of the common hepatic duct and cystic duct with moderate proximal biliary dilatation. The exact source of obstruction is unclear on today's examination, with differential considerations including an obstructing ductal stone (which is simply not visualized secondary to technical limitations on today's examination), ductal adenocarcinoma, or obstructing pancreatic head lesion (which is not visualized on today's examination secondary to technical limitations). Further evaluation with ERCP is recommended if clinically appropriate. 2. Cholelithiasis without evidence of acute cholecystitis at this time. Electronically Signed   By: Vinnie Langton M.D.   On: 10/23/2016 14:37   Mr Abdomen Mrcp Moise Boring Contast  Result Date: 10/23/2016 CLINICAL DATA:  83 year old male with history of progressively worsening jaundice. No associated fever, abdominal pain, nausea, emesis, constipation, melena or hematochezia. Biliary tract ductal dilatation noted on recent CT examination. EXAM: MRI ABDOMEN WITHOUT AND WITH CONTRAST (INCLUDING MRCP) TECHNIQUE: Multiplanar multisequence MR imaging of the abdomen was performed both before and after the administration of intravenous contrast. Heavily T2-weighted images of the biliary and pancreatic ducts were obtained, and three-dimensional MRCP images were rendered by post processing. CONTRAST:  90m MULTIHANCE GADOBENATE DIMEGLUMINE 529 MG/ML IV SOLN COMPARISON:  No priors.  CT of the abdomen and pelvis 10/22/2016. FINDINGS: Lower chest: Cardiomegaly. Hepatobiliary: No discrete cystic or solid hepatic lesions. MRCP images demonstrate moderate intra and extrahepatic biliary ductal dilatation which abruptly terminates at or just beyond the junction of the common hepatic and cystic ducts in the proximal common bile duct. The common hepatic duct measures up to 14 mm in diameter. The area of obstruction on MRCP images is poorly demonstrated secondary to central loss of signal  intensity on MRCP images. Unfortunately, this region is also poorly evaluated on postcontrast imaging secondary to patient motion. No discrete mass is identified in this region. Gallbladder is moderately distended. Several tiny filling defects are noted in the gallbladder, compatible with tiny gallstones. Gallbladder wall does not appear thickened and there is no pericholecystic fluid or surrounding inflammatory changes to suggest an acute cholecystitis at this time. Pancreas: No discrete pancreatic mass identified in the pancreatic head to account for the common bile duct obstruction. There are some tiny T1 hypointense, T2 hyperintense, nonenhancing lesions in the distal body and tail of the pancreas, measuring up to 1 cm in  diameter, presumably tiny pancreatic pseudocysts. No pancreatic ductal dilatation noted on MRCP images. No peripancreatic inflammatory changes. Spleen:  Unremarkable. Adrenals/Urinary Tract: Well-defined T1 hypointense, T2 hyperintense, nonenhancing lesions are noted in both kidneys, the largest of which is exophytic extending from the upper pole of the right kidney measuring up to 5.4 cm in diameter. No hydroureteronephrosis in the visualized portions of the abdomen. Bilateral adrenal glands are normal in appearance. Stomach/Bowel: Visualized portions are unremarkable. Vascular/Lymphatic: Aortic atherosclerosis, without definite aneurysm in the abdominal vasculature. No lymphadenopathy noted in the abdomen. Other: No significant volume of ascites noted in the visualized portions of the peritoneal cavity. Musculoskeletal: No aggressive osseous lesions are noted in the visualized portions of the skeleton. IMPRESSION: 1. Abrupt cut off of the common bile duct at or immediately beyond the junction of the common hepatic duct and cystic duct with moderate proximal biliary dilatation. The exact source of obstruction is unclear on today's examination, with differential considerations including an  obstructing ductal stone (which is simply not visualized secondary to technical limitations on today's examination), ductal adenocarcinoma, or obstructing pancreatic head lesion (which is not visualized on today's examination secondary to technical limitations). Further evaluation with ERCP is recommended if clinically appropriate. 2. Cholelithiasis without evidence of acute cholecystitis at this time. Electronically Signed   By: Vinnie Langton M.D.   On: 10/23/2016 14:37      No results found for: HGBA1C Lab Results  Component Value Date   LDLCALC 39 12/29/2011   CREATININE 1.28 (H) 10/23/2016       Scheduled Meds: . ampicillin-sulbactam (UNASYN) IVPB 3 g  3 g Intravenous Once  . pantoprazole  40 mg Oral Q0600   Continuous Infusions:    LOS: 2 days    Time spent: >30 MINS    [facility]  [facility] Pager [phone]. If 7PM-7AM, please contact night-coverage at [email or web address], password [facility] 10/24/2016, 8:43 AM  LOS: 2 days

## 2016-10-24 NOTE — H&P (View-Only) (Signed)
                                                                           [facility] Consult: 11:44 AM 10/23/2016  LOS: 1 day    Referring Provider: Dr Abrol  Primary Care Physician:  Samuel Bradshaw, MD of [facility] Primary Gastroenterologist:  Dr. Stark    Reason for Consultation:  Obstructive Jaundice.     HPI: Jimmy Myers is a 83 y.o. male.  PMH chronic Coumadin for A fib.  Atrial thrombus.  Anxiety.  Wegener's Lymphoma.  CHF.  HTN.   2013 EGD after cardiologist unable to pass TEE scope. Dr Brodie cleared esophagus of white secretions, otherwise normal study to D2.   .   04/2013 Colonoscopy.  For FOBT + and fam hx colon cancer.  3 mm sessile polyps (hyperplastic) x2.  Internal hemorrhoids.   RXd with Tamiflu for ? influenza, Omnicef for ? Evolving PNA started on 10/10/15.   At least 3 weeks of anorexia with at least a 5 pound weight loss in the last week. Night sweats for at least 3 weeks. Intermittent loose, nonbloody stools since mid January.  He and his wife started to notice jaundice over this past weekend, 5 days ago. At that time he also started seeing dark almost bloody-looking urine. He denies abdominal pain, nausea vomiting. No dysphagia. No heartburn. INR 11.8 on 2/20.  Rx with oral vitamin K at unkown dose in AM of 2/21.  Seen by cardiology NP 2/21, noted recent falls, weakness.  Not argumentative as is his norm.  Pt, wife reported the now obvious jaundice.  Patient doesn't consume alcohol. No history of liver problems. T bil 8.5.  Alk phos 562.  AST/ALT 113/144.  INT is 4.6.  Hgb 12 >> 10.4  Referred to ED for admission.   CT Ab pelvis: 19 mm CBD, abrupt cut off at head of pancreas, soft tissue fills lumen.  Suspicion for carcinoma.  Moderate intrahepatic biliary ductal dilatation, mild distention of the gallbladder.  Gallstones.  MRCP completed but not yet read.     Past  Medical History:  Diagnosis Date  . A-fib (HCC) May 2013   s/p TEE/cardioversion June 2013  . Anxiety   . Aortic root enlargement (HCC)    at least 5cm by CT  . Arthritis   . Atrial thrombus   . Bradycardia   . Bronchitis   . Cancer (HCC) 2016   lymphoma  . Chronic anticoagulation    on coumadin - checked in [place]  . Congestive heart failure (CHF) (HCC) 2013  . Diverticulosis   . Dysrhythmia    AFib  . GERD (gastroesophageal reflux disease)   . Hemorrhoids   . HTN (hypertension)   . Pneumonia May 2013  . Pneumonia 2013  . 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 Thrombocytopenia (HCC)     Past Surgical History:  Procedure Laterality Date  . CARDIOVERSION  12/30/2011   Procedure: CARDIOVERSION;  Surgeon: Brian S Crenshaw, MD;  Location: [facility];  Service: Cardiovascular;  Laterality:   N/A;  . CARDIOVERSION  02/16/2012   Procedure: CARDIOVERSION;  Surgeon: Brian S Crenshaw, MD;  Location: [facility];  Service: Cardiovascular;  Laterality: N/A;  . CATARACT EXTRACTION W/PHACO Left 07/28/2016   Procedure: CATARACT EXTRACTION PHACO AND INTRAOCULAR LENS PLACEMENT LEFT EYE:  CDE: 13.00;  Surgeon: Kerry Hunt, MD;  Location: [facility];  Service: Ophthalmology;  Laterality: Left;  . ESOPHAGOGASTRODUODENOSCOPY  12/30/2011   Procedure: ESOPHAGOGASTRODUODENOSCOPY (EGD);  Surgeon: Brian S Crenshaw, MD;  Location: [facility];  Service: Cardiovascular;  Laterality: N/A;  . ESOPHAGOGASTRODUODENOSCOPY  12/30/2011   Procedure: ESOPHAGOGASTRODUODENOSCOPY (EGD);  Surgeon: Dora M Brodie, MD;  Location: [facility];  Service: Endoscopy;  Laterality: N/A;  . KNEE ARTHROSCOPY  rt knee  . PROSTATE SURGERY    . TEE WITHOUT CARDIOVERSION  12/30/2011   Procedure: TRANSESOPHAGEAL ECHOCARDIOGRAM (TEE);  Surgeon: Brian S Crenshaw, MD;  Location: [facility];  Service:  Cardiovascular;  Laterality: N/A;  . TEE WITHOUT CARDIOVERSION  02/16/2012   Procedure: TRANSESOPHAGEAL ECHOCARDIOGRAM (TEE);  Surgeon: Brian S Crenshaw, MD;  Location: [facility];  Service: Cardiovascular;  Laterality: N/A;    Prior to Admission medications   Medication Sig Start Date End Date Taking? Authorizing Provider  albuterol (PROVENTIL HFA;VENTOLIN HFA) 108 (90 BASE) MCG/ACT inhaler Inhale 2 puffs into the lungs every 6 (six) hours as needed for wheezing or shortness of breath.   Yes Historical Provider, MD  clorazepate (TRANXENE) 3.75 MG tablet Take 1.875-3.75 mg by mouth daily as needed for anxiety.    Yes Historical Provider, MD  Emollient (EUCERIN) lotion Apply 3-5 mLs topically daily.    Yes Historical Provider, MD  fluticasone (FLONASE) 50 MCG/ACT nasal spray Place 1 spray into both nostrils daily as needed for allergies or rhinitis.   Yes Historical Provider, MD  folic acid (FOLVITE) 800 MCG tablet Take 800 mcg by mouth daily.   Yes Historical Provider, MD  furosemide (LASIX) 40 MG tablet TAKE ONE TABLET BY MOUTH ONCE DAILY 09/26/16  Yes Lori C Gerhardt, NP  guaiFENesin (MUCINEX) 600 MG 12 hr tablet Take 600 mg by mouth 2 (two) times daily as needed.   Yes Historical Provider, MD  lisinopril (PRINIVIL,ZESTRIL) 5 MG tablet TAKE ONE TABLET BY MOUTH ONCE DAILY Patient taking differently: TAKE ONE TABLET BY MOUTH ONCE DAILY AT 5PM 06/27/16  Yes Lori C Gerhardt, NP  loperamide (IMODIUM A-D) 2 MG tablet Take 2 mg by mouth 4 (four) times daily as needed for diarrhea or loose stools.   Yes Historical Provider, MD  nitroGLYCERIN (NITROSTAT) 0.4 MG SL tablet Place 1 tablet (0.4 mg total) under the tongue every 5 (five) minutes x 3 doses as needed for chest pain. 05/15/14 10/06/17 Yes Lori C Gerhardt, NP  omeprazole (PRILOSEC) 20 MG capsule Take 20 mg by mouth daily before breakfast.    Yes Historical Provider, MD  phytonadione (VITAMIN K) 5 MG tablet Take 5 mg by mouth once.   Yes Historical  Provider, MD  polyethylene glycol (MIRALAX / GLYCOLAX) packet Take 17 g by mouth at bedtime as needed.    Yes Historical Provider, MD  potassium chloride SA (K-DUR,KLOR-CON) 20 MEQ tablet Take 20 mEq by mouth 2 (two) times daily.   Yes Historical Provider, MD  warfarin (COUMADIN) 5 MG tablet Take as directed by coumadin clinic Patient taking differently: Take 2.5-5 mg by mouth See admin instructions. Take 2.5mg on MONDAYS AND FRIDAYS, take 5mgs daily on all other days of the week 07/13/13  Yes James Allred, MD  cefdinir (OMNICEF) 300   MG capsule Take 300 mg by mouth 2 (two) times daily. 10/09/16   Historical Provider, MD  oseltamivir (TAMIFLU) 75 MG capsule Take 75 mg by mouth 2 (two) times daily. 10/09/16   Historical Provider, MD    Scheduled Meds: . famotidine (PEPCID) IV  20 mg Intravenous Q12H  . phytonadione  5 mg Oral Once   Infusions: . sodium chloride 100 mL/hr at 10/23/16 1028   PRN Meds: LORazepam, ondansetron **OR** ondansetron (ZOFRAN) IV   Allergies as of 10/22/2016 - Review Complete 10/22/2016  Allergen Reaction Noted  . Codeine Other (See Comments) 12/28/2011  . Simvastatin Other (See Comments) 12/28/2011  . Vicodin [hydrocodone-acetaminophen] Other (See Comments) 12/28/2011    Family History  Problem Relation Age of Onset  . Heart disease Mother   . Heart disease Father   . Stomach cancer Brother   . Heart disease Sister   . Heart disease Brother   . Hypertension Sister     Social History   Social History  . Marital status: Married    Spouse name: N/A  . Number of children: 2  . Years of education: N/A   Occupational History  . retired Retired   Social History Main Topics  . Smoking status: Never Smoker  . Smokeless tobacco: Never Used  . Alcohol use No  . Drug use: No  . Sexual activity: Not Currently    Birth control/ protection: None   Other Topics Concern  . Not on file   Social History Narrative  . No narrative on file    REVIEW OF  SYSTEMS: Constitutional:  Fatigue, weakness and falls due to legs giving out from under him. ENT:  No nose bleeds Pulm:  Frothy, white cough. The respiratory issues he was having at the beginning of the month have mostly resolved. CV:  No palpitations, no LE edema. No chest pain GU:   Dark urine per HPI GI:  Per HPI Heme:  Bruises easily but has not been having unusual oral, dermatologic, urologic or GI bleeding.   Transfusions:  None in records Neuro:  No headaches, no peripheral tingling or numbness.  No syncope.  Pt is supposed to undergo cataract surgery on 2/26. Derm:  Intermittent pruritus, he doesn't think this is any worse now that it has been over many months Endocrine:  No sweats or chills.  No polyuria or dysuria  Immunization:  Vaccinated for influenza in 06/2016. Travel:  None beyond local counties in last few months.    PHYSICAL EXAM: Vital signs in last 24 hours: Vitals:   10/23/16 0507 10/23/16 1022  BP: (!) 108/56 (!) 127/97  Pulse: 65 66  Resp: 20 19  Temp: 99.5 F (37.5 C) 98.2 F (36.8 C)   Wt Readings from Last 3 Encounters:  10/23/16 90.6 kg (199 lb 11.8 oz)  10/22/16 92.1 kg (203 lb 1.9 oz)  10/09/16 92.6 kg (204 lb 3.2 oz)    General: Jaundiced, otherwise well appearing WM who is comfortable. Head:  No facial asymmetry or swelling. No signs of head trauma.  Eyes:  Scleral icterus, slightly pale conjunctiva. Ears:  HOH.  Nose:  No discharge or congestion. Mouth:  Moist, clear oral mucosa. Tongue midline. Neck:  No JVD, no masses, no thyromegaly. Lungs:  Clear bilaterally. No labored breathing or cough. Heart: RRR. S1, S2 present. Soft murmur Abdomen:  Not tender or distended. Active bowel sounds. No HSM, masses, bruits, hernias..   Rectal: Deferred   Musc/Skeltl: No joint erythema, swelling or gross   deformities. Extremities:  No CCE.  Neurologic:  Alert. Oriented times 3. Hard of hearing. No tremor or asterixis. Moves all 4 limbs, strength not  tested. Skin:  Jaundiced. No purpura or hematomas. Nodes:  No cervical or inguinal adenopathy.   Psych:  Pleasant, calm, cooperative. fair historian. Fluent speech.  Intake/Output from previous day: 02/21 0701 - 02/22 0700 In: 500 [I.V.:450; IV Piggyback:50] Out: 450 [Urine:450] Intake/Output this shift: Total I/O In: -  Out: 250 [Urine:250]  LAB RESULTS:  Recent Labs  10/21/16 1010 10/22/16 1232 10/23/16 0607  WBC 6.0 7.8 6.6  HGB  --   --  10.4*  HCT 35.9* 33.8* 31.9*  PLT 166 146* 121*   BMET Lab Results  Component Value Date   NA 136 10/23/2016   NA 130 (L) 10/22/2016   NA 138 07/22/2016   K 4.5 10/23/2016   K 5.1 10/22/2016   K 4.2 07/22/2016   CL 102 10/23/2016   CL 100 10/22/2016   CL 103 07/22/2016   CO2 26 10/23/2016   CO2 25 10/22/2016   CO2 28 07/22/2016   GLUCOSE 96 10/23/2016   GLUCOSE 95 10/22/2016   GLUCOSE 107 (H) 07/22/2016   BUN 18 10/23/2016   BUN 21 10/22/2016   BUN 20 07/22/2016   CREATININE 1.28 (H) 10/23/2016   CREATININE 1.15 10/22/2016   CREATININE 1.03 07/22/2016   CALCIUM 8.5 (L) 10/23/2016   CALCIUM 8.6 10/22/2016   CALCIUM 8.7 (L) 07/22/2016   LFT  Recent Labs  10/22/16 1232 10/23/16 0607 10/23/16 0733  PROT 5.3* 5.3*  --   ALBUMIN 3.4* 2.3*  --   AST 113* 101*  --   ALT 144* 116*  --   ALKPHOS 562* 387*  --   BILITOT 8.5* 10.4*  --   BILIDIR 5.49*  --  6.5*   PT/INR Lab Results  Component Value Date   INR 4.62 (HH) 10/23/2016   INR 5.20 (HH) 10/22/2016   INR >8.0 (>) 10/21/2016   Hepatitis Panel No results for input(s): HEPBSAG, HCVAB, HEPAIGM, HEPBIGM in the last 72 hours. C-Diff No components found for: CDIFF Lipase  No results found for: LIPASE  Drugs of Abuse  No results found for: LABOPIA, COCAINSCRNUR, LABBENZ, AMPHETMU, THCU, LABBARB   RADIOLOGY STUDIES: Ct Abdomen Pelvis W Contrast  Result Date: 10/22/2016 CLINICAL DATA:  84 y/o  M; elevated liver enzymes and jaundice. EXAM: CT ABDOMEN AND  PELVIS WITH CONTRAST TECHNIQUE: Multidetector CT imaging of the abdomen and pelvis was performed using the standard protocol following bolus administration of intravenous contrast. CONTRAST:  100mL ISOVUE-300 IOPAMIDOL (ISOVUE-300) INJECTION 61% COMPARISON:  08/16/2012 CT of the abdomen and pelvis. FINDINGS: Lower chest: Moderate cardiomegaly. Aortic valvular calcification. No acute abnormality. Hepatobiliary: Moderate intrahepatic biliary ductal dilatation, mild distention of the gallbladder, a distention of the common bile duct up to 19 mm (series 5, image 69). The common bile duct is abruptly cut off as it enters the head of the pancreas with soft tissue filling the lumen. Two small gallstones within the gallbladder. Pancreas: No main duct dilatation of the pancreas. No inflammatory change. Spleen: Normal in size without focal abnormality. Adrenals/Urinary Tract: Lucencies in the kidneys the largest arising exophytic from the upper pole of right kidney measuring 41 mm, likely representing cysts. Normal adrenal glands. No urinary stone disease or obstructive uropathy. Normal bladder. Stomach/Bowel: Stomach is within normal limits. Appendix appears normal. No evidence of bowel wall thickening, distention, or inflammatory changes. Vascular/Lymphatic: Aortic atherosclerosis. No enlarged abdominal   or pelvic lymph nodes. Reproductive: Mild prostate hypertrophy and calcifications. Other: No ascites. Musculoskeletal: No acute osseous abnormality. Multilevel degenerative changes of the spine with discogenic and prominent lower lumbar facet arthropathy. IMPRESSION: 1. Moderate intrahepatic biliary ductal dilatation, mild distention of the gallbladder, a distention of the common bile duct up to 19 mm. The common bile duct is abruptly cut off as it enters the head of the pancreas with soft tissue filling the lumen. 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 MRI/MRCP is recommended with and  without intravenous contrast for further evaluation. 2. Moderate cardiomegaly. 3. Cholelithiasis. 4. Aortic atherosclerosis. 5. Prostate hypertrophy. Electronically Signed   By: Lance  Furusawa-Stratton M.D.   On: 10/22/2016 22:29    IMPRESSION:   *  Obstructive jaundice with ? cholangiocarcinoma vs pancreatic cancer.   *  Coagulopathy.  Chronic Coumadin for A fib, with INR >11 two days ago.  Dosed with oral Vitamin K yesterday.  Repeat dosing ordered, not yet given.   *   Thrombocytopenia, noncritical.  *  History Wegener's lymphoma  CHF  Weight loss   PLAN:     *  Needs ERCP once coags corrected.  Has slot at 11:15 on 2/23, if coags allow.    *  Await reading of MRCP though, regardless of findings, will need ERCP.  *  CA 19-9 ordered.  Ordered regular diet.  *  Ordered vitamin K 10 mg IV in place of po Vitamin K.     Sarah Gribbin  10/23/2016, 11:44 AM Pager: [phone]  I have reviewed the entire case in detail with the above APP and discussed the plan in detail.  Therefore, I agree with the diagnoses recorded above. In addition,  I have personally interviewed and examined the patient and have personally reviewed any abdominal/pelvic CT scan images.  My additional thoughts are as follows:  Obstructive jaundice - overall clinical scenario most worrisome for pancreatic or biliary malignancy.  Details of 2016 lymphoma diagnosis are unknown, but will keep in mind possibility of that having recurred to cause this.  ERCP tomorrow if INR allows.  Vitamin K given today CA 19-9 level pending.  Possibly needs outpatient EUS depending upon findings tomorrow. Wife and son present for entire encounter.  Patient agreeable to ERCP after discussion of BARS.  The benefits and risks of the planned procedure were described in detail with the patient or (when appropriate) their health care proxy.  Risks were outlined as including, but not limited to, bleeding, infection, perforation, adverse  medication reaction leading to cardiac or pulmonary decompensation, or pancreatitis (if ERCP).  The limitation of incomplete mucosal visualization was also discussed.  No guarantees or warranties were given.  Patient at increased risk for cardiopulmonary complications of procedure due to medical comorbidities.   Henry L Danis III Pager [phone]  Mon-Fri 8a-5p [phone] after 5p, weekends, holidays    

## 2016-10-24 NOTE — Anesthesia Procedure Notes (Signed)
Procedure Name: Intubation Date/Time: 10/24/2016 12:13 PM Performed by: Rejeana Brock L Pre-anesthesia Checklist: Patient identified, Emergency Drugs available, Suction available and Patient being monitored Patient Re-evaluated:Patient Re-evaluated prior to inductionOxygen Delivery Method: Circle System Utilized Preoxygenation: Pre-oxygenation with 100% oxygen Intubation Type: IV induction Ventilation: Mask ventilation without difficulty Laryngoscope Size: Mac and 4 Grade View: Grade I Tube type: Oral Tube size: 7.5 mm Number of attempts: 1 Airway Equipment and Method: Stylet and Oral airway Placement Confirmation: ETT inserted through vocal cords under direct vision,  positive ETCO2 and breath sounds checked- equal and bilateral Secured at: 22 cm Tube secured with: Tape Dental Injury: Teeth and Oropharynx as per pre-operative assessment

## 2016-10-24 NOTE — Anesthesia Preprocedure Evaluation (Addendum)
Anesthesia Evaluation  Patient identified by MRN, date of birth, ID band Patient awake    Reviewed: Allergy & Precautions, NPO status , Patient's Chart, lab work & pertinent test results  Airway Mallampati: II  TM Distance: >3 FB     Dental   Pulmonary pneumonia,    breath sounds clear to auscultation       Cardiovascular hypertension, +CHF  + dysrhythmias  Rhythm:Regular Rate:Normal     Neuro/Psych negative neurological ROS     GI/Hepatic Neg liver ROS, GERD  ,  Endo/Other    Renal/GU negative Renal ROS     Musculoskeletal  (+) Arthritis ,   Abdominal   Peds  Hematology   Anesthesia Other Findings   Reproductive/Obstetrics                            Anesthesia Physical Anesthesia Plan  ASA: III  Anesthesia Plan: General   Post-op Pain Management:    Induction: Intravenous, Rapid sequence and Cricoid pressure planned  Airway Management Planned: Oral ETT  Additional Equipment:   Intra-op Plan:   Post-operative Plan: Possible Post-op intubation/ventilation  Informed Consent: I have reviewed the patients History and Physical, chart, labs and discussed the procedure including the risks, benefits and alternatives for the proposed anesthesia with the patient or authorized representative who has indicated his/her understanding and acceptance.   Dental advisory given  Plan Discussed with: Anesthesiologist and CRNA  Anesthesia Plan Comments:         Anesthesia Quick Evaluation

## 2016-10-24 NOTE — Interval H&P Note (Signed)
History and Physical Interval Note:  10/24/2016 11:44 AM  Jimmy Myers  has presented today for surgery, with the diagnosis of jaundice, dilated bile duct, suspicion for cancer.  The various methods of treatment have been discussed with the patient and family. After consideration of risks, benefits and other options for treatment, the patient has consented to  Procedure(s): ENDOSCOPIC RETROGRADE CHOLANGIOPANCREATOGRAPHY (ERCP) WITH PROPOFOL (N/A) as a surgical intervention .  The patient's history has been reviewed, patient examined, no change in status, stable for surgery.  I have reviewed the patient's chart and labs.  Questions were answered to the patient's satisfaction.    INR normalized today after vitamin K.  Bilirubin rising.  Nelida Meuse III

## 2016-10-24 NOTE — Progress Notes (Signed)
IR aware of request.  Orders written so hopefully the procedure can be done tomorrow.  We will see the patient tomorrow as well.  Jimmy Myers E 4:25 PM 10/24/2016

## 2016-10-24 NOTE — Consult Note (Signed)
Date: 10/24/2016               Patient Name:  Jimmy Myers MRN: [id number]  DOB: 09/21/1931 Age / Sex: 83 y.o., male   PCP: [name], MD         Requesting Physician: Dr. Reyne Dumas, MD    Consulting Reason:  Decide whether the patient should be on long-term anticoagulation.     History of Present Illness: Patient is a 83 year old male with a past medical history of chronic A. fib with prior cardioversion, bradycardia, enlarged ascending aorta, history of atrial thrombus in 2013, lymphoma, combined systolic and diastolic congestive heart failure, hypertension, chronic thrombocytopenia who was admitted to the hospital on 10/22/2016 for progressively worse jaundice. ERCP done today showing severe biliary stricture. Cardiology has been consulted by the primary team to help decide whether the patient should be on long-term anticoagulation. Patient was anticoagulated with Coumadin, which was held due to recent supratherapeutic INR prior to this hospitalization.Patient states he was diagnosed with A. fib in 2013 and has been on Coumadin since then. Denies having any heart palpitations, chest pain, dyspnea, dizziness, or syncopal episodes. Reports having fatigue since early February when he was diagnosed with the flu. Denies having any orthopnea or lower extremity edema. Denies having any history of major bleeding but does state that he bruises easily.  EKG done 10/22/2016 showing wandering atrial tachycardia (HR 96). Echo done in May 2017 showing left ventricular ejection fraction 47-42%, grade 1 diastolic dysfunction, mild aortic regurgitation, mild mitral regurgitation, and a mildly dilated left atrium. Event monitor in 2014 showing an average heart rate of 61.  Meds: Current Facility-Administered Medications  Medication Dose Route Frequency Provider Last Rate Last Dose  . [MAR Hold] Ampicillin-Sulbactam (UNASYN) 3 g in sodium chloride 0.9 % 100 mL IVPB  3 g Intravenous Q6H Nelida Meuse III,  MD      . indomethacin (INDOCIN) 50 MG suppository 100 mg  100 mg Rectal Once Vena Rua, PA-C      . [MAR Hold] LORazepam (ATIVAN) injection 0.5 mg  0.5 mg Intravenous Q30 min PRN Reubin Milan, MD   0.5 mg at 10/23/16 1018  . [MAR Hold] ondansetron (ZOFRAN) tablet 4 mg  4 mg Oral Q6H PRN Reubin Milan, MD       Or  . Doug Sou Hold] ondansetron [facility]) injection 4 mg  4 mg Intravenous Q6H PRN Reubin Milan, MD      . Doug Sou Hold] pantoprazole (PROTONIX) EC tablet 40 mg  40 mg Oral Q0600 Vena Rua, PA-C   40 mg at 10/24/16 5956    Allergies: Allergies as of 10/22/2016 - Review Complete 10/22/2016  Allergen Reaction Noted  . Codeine Other (See Comments) 12/28/2011  . Simvastatin Other (See Comments) 12/28/2011  . Vicodin [hydrocodone-acetaminophen] Other (See Comments) 12/28/2011   Past Medical History:  Diagnosis Date  . A-fib [facility]) May 2013   s/p TEE/cardioversion June 2013  . Anxiety   . Aortic root enlargement (HCC)    at least 5cm by CT  . Arthritis   . Atrial thrombus   . Bradycardia   . Bronchitis   . Cancer ([place]) 2016   lymphoma  . Chronic anticoagulation    on coumadin - checked in [place]  . Congestive heart failure (CHF) ([place]) 2013  . Diverticulosis   . Dysrhythmia    AFib  . GERD (gastroesophageal reflux disease)   . Hemorrhoids   . HTN (hypertension)   .  Pneumonia May 2013  . Pneumonia 2013  . 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 Thrombocytopenia (HCC)    Past Surgical History:  Procedure Laterality Date  . CARDIOVERSION  12/30/2011   Procedure: CARDIOVERSION;  Surgeon: Lewayne Bunting, MD;  Location: [facility] ENDOSCOPY;  Service: Cardiovascular;  Laterality: N/A;  . CARDIOVERSION  02/16/2012   Procedure: CARDIOVERSION;  Surgeon: Lewayne Bunting, MD;  Location: [facility] ENDOSCOPY;  Service: Cardiovascular;   Laterality: N/A;  . CATARACT EXTRACTION W/PHACO Left 07/28/2016   Procedure: CATARACT EXTRACTION PHACO AND INTRAOCULAR LENS PLACEMENT LEFT EYE:  CDE: 13.00;  Surgeon: Gemma Payor, MD;  Location: [facility] ORS;  Service: Ophthalmology;  Laterality: Left;  . ESOPHAGOGASTRODUODENOSCOPY  12/30/2011   Procedure: ESOPHAGOGASTRODUODENOSCOPY (EGD);  Surgeon: Lewayne Bunting, MD;  Location: [facility] ENDOSCOPY;  Service: Cardiovascular;  Laterality: N/A;  . ESOPHAGOGASTRODUODENOSCOPY  12/30/2011   Procedure: ESOPHAGOGASTRODUODENOSCOPY (EGD);  Surgeon: Hart Carwin, MD;  Location: [facility] ENDOSCOPY;  Service: Endoscopy;  Laterality: N/A;  . KNEE ARTHROSCOPY  rt knee  . PROSTATE SURGERY    . TEE WITHOUT CARDIOVERSION  12/30/2011   Procedure: TRANSESOPHAGEAL ECHOCARDIOGRAM (TEE);  Surgeon: Lewayne Bunting, MD;  Location: [facility] ENDOSCOPY;  Service: Cardiovascular;  Laterality: N/A;  . TEE WITHOUT CARDIOVERSION  02/16/2012   Procedure: TRANSESOPHAGEAL ECHOCARDIOGRAM (TEE);  Surgeon: Lewayne Bunting, MD;  Location: [facility] ENDOSCOPY;  Service: Cardiovascular;  Laterality: N/A;   Family History  Problem Relation Age of Onset  . Heart disease Mother   . Heart disease Father   . Stomach cancer Brother   . Heart disease Sister   . Heart disease Brother   . Hypertension Sister    Social History   Social History  . Marital status: Married    Spouse name: N/A  . Number of children: 2  . Years of education: N/A   Occupational History  . retired Retired   Social History Main Topics  . Smoking status: Never Smoker  . Smokeless tobacco: Never Used  . Alcohol use No  . Drug use: No  . Sexual activity: Not Currently    Birth control/ protection: None   Other Topics Concern  . Not on file   Social History Narrative  . No narrative on file    Review of Systems: Pertinent positives mentioned in HPI. Remainder of all ROS negative.   Physical Exam: Blood pressure 132/64, pulse 64, temperature 98.5 F (36.9 C), temperature  source Oral, resp. rate (!) 22, height 5\' 10"  (1.778 m), weight 91.7 kg (202 lb 2.6 oz), SpO2 98 %. Physical Exam  Constitutional: He is oriented to person, place, and time. No distress.  Appears jaundiced  HENT:  Head: Normocephalic and atraumatic.  Oropharyngeal purpura noted  Eyes: Scleral icterus is present.  Neck: No JVD present.  Cardiovascular: Normal rate, regular rhythm and intact distal pulses.  Exam reveals no gallop and no friction rub.   No murmur heard. Pulmonary/Chest: Effort normal and breath sounds normal. No respiratory distress. He has no wheezes. He has no rales.  Coughing  Abdominal: Soft. Bowel sounds are normal. He exhibits no distension. There is no tenderness.  Musculoskeletal: He exhibits no edema or deformity.  Neurological: He is alert and oriented to person, place, and time.  Skin: Skin is warm and dry. He is not diaphoretic.  Senile purpura noted on upper  and lower extremities    Lab results: Basic Metabolic Panel:  Recent Labs  10/23/16 0607 10/24/16 0731  NA 136 137  K 4.5 4.0  CL 102 108  CO2 26 22  GLUCOSE 96 98  BUN 18 22*  CREATININE 1.28* 1.22  CALCIUM 8.5* 7.8*   Liver Function Tests:  Recent Labs  10/23/16 0607 10/24/16 0731  AST 101* 91*  ALT 116* 99*  ALKPHOS 387* 346*  BILITOT 10.4* 13.1*  PROT 5.3* 4.7*  ALBUMIN 2.3* 1.9*   CBC:  Recent Labs  10/23/16 0607 10/24/16 0731  WBC 6.6 6.9  NEUTROABS 3.9 4.7  HGB 10.4* 9.7*  HCT 31.9* 29.1*  MCV 96.1 93.9  PLT 121* 104*   Coagulation:  Recent Labs  10/23/16 0607 10/24/16 0731  LABPROT 44.9* 15.7*  INR 4.62* 1.25   Urinalysis:  Recent Labs  10/22/16 1232  GLUCOSEU Negative  BILIRUBINUR Positive*  PROTEINUR Negative  NITRITE Negative  LEUKOCYTESUR Negative   Imaging results:  Ct Abdomen Pelvis W Contrast  Result Date: 10/22/2016 CLINICAL DATA:  83 y/o  M; elevated liver enzymes and jaundice. EXAM: CT ABDOMEN AND PELVIS WITH CONTRAST TECHNIQUE:  Multidetector CT imaging of the abdomen and pelvis was performed using the standard protocol following bolus administration of intravenous contrast. CONTRAST:  171m ISOVUE-300 IOPAMIDOL (ISOVUE-300) INJECTION 61% COMPARISON:  08/16/2012 CT of the abdomen and pelvis. FINDINGS: Lower chest: Moderate cardiomegaly. Aortic valvular calcification. No acute abnormality. Hepatobiliary: Moderate intrahepatic biliary ductal dilatation, mild distention of the gallbladder, a distention of the common bile duct up to 19 mm (series 5, image 69). The common bile duct is abruptly cut off as it enters the head of the pancreas with soft tissue filling the lumen. Two small gallstones within the gallbladder. Pancreas: No main duct dilatation of the pancreas. No inflammatory change. Spleen: Normal in size without focal abnormality. Adrenals/Urinary Tract: Lucencies in the kidneys the largest arising exophytic from the upper pole of right kidney measuring 41 mm, likely representing cysts. Normal adrenal glands. No urinary stone disease or obstructive uropathy. Normal bladder. Stomach/Bowel: Stomach is within normal limits. Appendix appears normal. No evidence of bowel wall thickening, distention, or inflammatory changes. Vascular/Lymphatic: Aortic atherosclerosis. No enlarged abdominal or pelvic lymph nodes. Reproductive: Mild prostate hypertrophy and calcifications. Other: No ascites. Musculoskeletal: No acute osseous abnormality. Multilevel degenerative changes of the spine with discogenic and prominent lower lumbar facet arthropathy. IMPRESSION: 1. Moderate intrahepatic biliary ductal dilatation, mild distention of the gallbladder, a distention of the common bile duct up to 19 mm. The common bile duct is abruptly cut off as it enters the head of the pancreas with soft tissue filling the lumen. 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 MRI/MRCP is recommended with and without intravenous contrast for  further evaluation. 2. Moderate cardiomegaly. 3. Cholelithiasis. 4. Aortic atherosclerosis. 5. Prostate hypertrophy. Electronically Signed   By: LKristine GarbeM.D.   On: 10/22/2016 22:29   Mr 3d Recon At Scanner  Result Date: 10/23/2016 CLINICAL DATA:  83year old male with history of progressively worsening jaundice. No associated fever, abdominal pain, nausea, emesis, constipation, melena or hematochezia. Biliary tract ductal dilatation noted on recent CT examination. EXAM: MRI ABDOMEN WITHOUT AND WITH CONTRAST (INCLUDING MRCP) TECHNIQUE: Multiplanar multisequence MR imaging of the abdomen was performed both before and after the administration of intravenous contrast. Heavily T2-weighted images of the biliary and pancreatic ducts were obtained, and three-dimensional MRCP images were rendered by post processing. CONTRAST:  164m  MULTIHANCE GADOBENATE DIMEGLUMINE 529 MG/ML IV SOLN COMPARISON:  No priors.  CT of the abdomen and pelvis 10/22/2016. FINDINGS: Lower chest: Cardiomegaly. Hepatobiliary: No discrete cystic or solid hepatic lesions. MRCP images demonstrate moderate intra and extrahepatic biliary ductal dilatation which abruptly terminates at or just beyond the junction of the common hepatic and cystic ducts in the proximal common bile duct. The common hepatic duct measures up to 14 mm in diameter. The area of obstruction on MRCP images is poorly demonstrated secondary to central loss of signal intensity on MRCP images. Unfortunately, this region is also poorly evaluated on postcontrast imaging secondary to patient motion. No discrete mass is identified in this region. Gallbladder is moderately distended. Several tiny filling defects are noted in the gallbladder, compatible with tiny gallstones. Gallbladder wall does not appear thickened and there is no pericholecystic fluid or surrounding inflammatory changes to suggest an acute cholecystitis at this time. Pancreas: No discrete pancreatic mass  identified in the pancreatic head to account for the common bile duct obstruction. There are some tiny T1 hypointense, T2 hyperintense, nonenhancing lesions in the distal body and tail of the pancreas, measuring up to 1 cm in diameter, presumably tiny pancreatic pseudocysts. No pancreatic ductal dilatation noted on MRCP images. No peripancreatic inflammatory changes. Spleen:  Unremarkable. Adrenals/Urinary Tract: Well-defined T1 hypointense, T2 hyperintense, nonenhancing lesions are noted in both kidneys, the largest of which is exophytic extending from the upper pole of the right kidney measuring up to 5.4 cm in diameter. No hydroureteronephrosis in the visualized portions of the abdomen. Bilateral adrenal glands are normal in appearance. Stomach/Bowel: Visualized portions are unremarkable. Vascular/Lymphatic: Aortic atherosclerosis, without definite aneurysm in the abdominal vasculature. No lymphadenopathy noted in the abdomen. Other: No significant volume of ascites noted in the visualized portions of the peritoneal cavity. Musculoskeletal: No aggressive osseous lesions are noted in the visualized portions of the skeleton. IMPRESSION: 1. Abrupt cut off of the common bile duct at or immediately beyond the junction of the common hepatic duct and cystic duct with moderate proximal biliary dilatation. The exact source of obstruction is unclear on today's examination, with differential considerations including an obstructing ductal stone (which is simply not visualized secondary to technical limitations on today's examination), ductal adenocarcinoma, or obstructing pancreatic head lesion (which is not visualized on today's examination secondary to technical limitations). Further evaluation with ERCP is recommended if clinically appropriate. 2. Cholelithiasis without evidence of acute cholecystitis at this time. Electronically Signed   By: Vinnie Langton M.D.   On: 10/23/2016 14:37   Dg C-arm 1-60 Min-no  Report  Result Date: 10/24/2016 Fluoroscopy was utilized by the requesting physician.  No radiographic interpretation.   Mr Abdomen Mrcp Moise Boring Contast  Result Date: 10/23/2016 CLINICAL DATA:  83 year old male with history of progressively worsening jaundice. No associated fever, abdominal pain, nausea, emesis, constipation, melena or hematochezia. Biliary tract ductal dilatation noted on recent CT examination. EXAM: MRI ABDOMEN WITHOUT AND WITH CONTRAST (INCLUDING MRCP) TECHNIQUE: Multiplanar multisequence MR imaging of the abdomen was performed both before and after the administration of intravenous contrast. Heavily T2-weighted images of the biliary and pancreatic ducts were obtained, and three-dimensional MRCP images were rendered by post processing. CONTRAST:  53m MULTIHANCE GADOBENATE DIMEGLUMINE 529 MG/ML IV SOLN COMPARISON:  No priors.  CT of the abdomen and pelvis 10/22/2016. FINDINGS: Lower chest: Cardiomegaly. Hepatobiliary: No discrete cystic or solid hepatic lesions. MRCP images demonstrate moderate intra and extrahepatic biliary ductal dilatation which abruptly terminates at or just beyond the junction  of the common hepatic and cystic ducts in the proximal common bile duct. The common hepatic duct measures up to 14 mm in diameter. The area of obstruction on MRCP images is poorly demonstrated secondary to central loss of signal intensity on MRCP images. Unfortunately, this region is also poorly evaluated on postcontrast imaging secondary to patient motion. No discrete mass is identified in this region. Gallbladder is moderately distended. Several tiny filling defects are noted in the gallbladder, compatible with tiny gallstones. Gallbladder wall does not appear thickened and there is no pericholecystic fluid or surrounding inflammatory changes to suggest an acute cholecystitis at this time. Pancreas: No discrete pancreatic mass identified in the pancreatic head to account for the common bile duct  obstruction. There are some tiny T1 hypointense, T2 hyperintense, nonenhancing lesions in the distal body and tail of the pancreas, measuring up to 1 cm in diameter, presumably tiny pancreatic pseudocysts. No pancreatic ductal dilatation noted on MRCP images. No peripancreatic inflammatory changes. Spleen:  Unremarkable. Adrenals/Urinary Tract: Well-defined T1 hypointense, T2 hyperintense, nonenhancing lesions are noted in both kidneys, the largest of which is exophytic extending from the upper pole of the right kidney measuring up to 5.4 cm in diameter. No hydroureteronephrosis in the visualized portions of the abdomen. Bilateral adrenal glands are normal in appearance. Stomach/Bowel: Visualized portions are unremarkable. Vascular/Lymphatic: Aortic atherosclerosis, without definite aneurysm in the abdominal vasculature. No lymphadenopathy noted in the abdomen. Other: No significant volume of ascites noted in the visualized portions of the peritoneal cavity. Musculoskeletal: No aggressive osseous lesions are noted in the visualized portions of the skeleton. IMPRESSION: 1. Abrupt cut off of the common bile duct at or immediately beyond the junction of the common hepatic duct and cystic duct with moderate proximal biliary dilatation. The exact source of obstruction is unclear on today's examination, with differential considerations including an obstructing ductal stone (which is simply not visualized secondary to technical limitations on today's examination), ductal adenocarcinoma, or obstructing pancreatic head lesion (which is not visualized on today's examination secondary to technical limitations). Further evaluation with ERCP is recommended if clinically appropriate. 2. Cholelithiasis without evidence of acute cholecystitis at this time. Electronically Signed   By: Vinnie Langton M.D.   On: 10/23/2016 14:37    Other results: EKG (10/22/2016): Normal sinus rhythm and PACs (heart rate 96).  Telemetry: NSR,  5 beat run of nonsustained VT, and PVCs.  Assessment, Plan, & Recommendations by Problem: Principal Problem:   Jaundice Active Problems:   Chronic systolic heart failure (HCC)   Atrial fibrillation   HTN (hypertension), benign   Supratherapeutic INR   GERD (gastroesophageal reflux disease)   Anxiety   Common bile duct stricture  Chronic atrial fibrillation: CHA2DS-VASc 4 and prior history of atrial thrombus in 2013. Currently in NSR. He is not on any medications for rate or rhythm control. EKG done 10/22/2016 showing wandering atrial tachycardia (HR 96). Echo done in May 2017 demonstrating mild aortic regurgitation, mild mitral regurgitation, and a mildly dilated left atrium. He had a supratherapeutic INR (11.8 on February 20) likely secondary to hepatic coagulopathy, which normalized with vitamin K. INR subtherapeutic (1.25) today. Labs showing elevated AST, ALT, and alkaline phosphatase. T bili 13.1. ERCP demonstrated a severe biliary stricture.  - Consider his poor hepatic function, risks of anticoagulation outweigh benefits at this time. Recommend holding off anticoagulation until patient's hepatic function improves. Recommend rechecking patient's hepatic function as outpatient before making the decision to resume anticoagulation; switching him to a NOAC might be an  option at that time.    Combined systolic and diastolic heart failure: Echo done in May 2017 showing left ventricular ejection fraction 40-45% and grade 1 diastolic dysfunction. Euvolemic on exam. BP stable.  -Continue to hold Lisinopril and Lasix at this time   Hypertension: BP stable. -Continue to hold lisinopril and Lasix at this time  Obstructive jaundice: Labs showing elevated AST, ALT, and alkaline phosphatase. T bili 13.1. Patient underwent ERCP today which demonstrated a severe biliary stricture. GI recommending IR consult for PTC and drain and/ or stent.   Chronic thrombocytopenia: Stable. Platelets 104,000 today,  close to baseline.  Signed: Shela Leff, MD 10/24/2016, 3:27 PM   Patient seen, examined. Available data reviewed. Agree with findings, assessment, and plan as outlined by Dr Marlowe Sax. Exam reveals an elderly male in no distress. Jugular venous pressure is normal. Lung fields are clear. Heart is regular rate and rhythm without murmur. Abdomen is soft and nontender. Extremities with trace edema. Skin is jaundiced.  I agree with the plan as detailed above. The patient's EKG, telemetry, and outpatient notes are all personally reviewed. He is in sinus rhythm. He does have a history of left atrial appendage thrombus identified in 2013 that resolved on follow-up TEE after a period of anticoagulation. He has been maintained on long-term warfarin and recently was found to have a markedly elevated INR in the context of his poor liver function. He is currently dealing with obstructive jaundice related to a biliary stricture. Plans noted for either percutaneous drainage or stent placement by interventional radiology pending their evaluation. Considering the risks and benefits of anticoagulation in this context of his paroxysmal atrial fibrillation, I think it is best to keep him off of oral anticoagulation at the present time. Once his obstructive jaundiced is treated in his liver function is returning to baseline, it would be reasonable to consider restarting he or warfarin or a direct oral anticoagulant drug at that time. Plan discussed at length with the family and the patient, all are in agreement. Will arrange early hospital follow-up to reassess l;ong-term anticoagulation.   Sherren Mocha, M.D. 10/24/2016 6:24 PM

## 2016-10-24 NOTE — Op Note (Signed)
[facility] Patient Name: Jimmy Myers Procedure Date : 10/24/2016 MRN: [id number] Attending MD: Estill Cotta. Loletha Carrow , MD Date of Birth: 1931-09-02 CSN: [id number] Age: 83 Admit Type: Inpatient Procedure:                ERCP Indications:              Jaundice Providers:                Mallie Mussel L. Loletha Carrow, MD, Vista Lawman, RN, Elspeth Cho Tech., Technician, Rejeana Brock, CRNA Referring MD:              Medicines:                General Anesthesia, Unasyn 3 g IV, Indomethacin 100                            mg PR Complications:            No immediate complications. Estimated Blood Loss:     Estimated blood loss: none. Procedure:                Pre-Anesthesia Assessment:                           - Prior to the procedure, a History and Physical                            was performed, and patient medications and                            allergies were reviewed. The patient's tolerance of                            previous anesthesia was also reviewed. The risks                            and benefits of the procedure and the sedation                            options and risks were discussed with the patient.                            All questions were answered, and informed consent                            was obtained. Prior Anticoagulants: The patient has                            taken Coumadin (warfarin), last dose was 3 days                            prior to procedure. ASA Grade Assessment: III - A  patient with severe systemic disease. After                            reviewing the risks and benefits, the patient was                            deemed in satisfactory condition to undergo the                            procedure.                           After obtaining informed consent, the scope was                            passed under direct vision. Throughout the                            procedure, the  patient's blood pressure, pulse, and                            oxygen saturations were monitored continuously. The                            [id number] ([id number]) scope was introduced through                            the mouth, and used to inject contrast into and                            used to inject contrast into the bile duct. The                            ERCP was technically difficult and complex due to                            challenging cannulation and difficulty passing                            guidewires through biliary ductal stenosis. The                            patient tolerated the procedure well. Scope In: Scope Out: Findings:      The scout film was normal. The esophagus was successfully intubated       under direct vision. The scope was advanced to a normal major papilla in       the descending duodenum without detailed examination of the pharynx,       larynx and associated structures, and upper GI tract. The upper GI tract       was grossly normal. Bile was occasionally passing from the papilla.       0.035 inch x 260 cm straight Hydra Jagwire was passed into the biliary       tree (but only the most distal bile duct - looping). The traction       (  standard) sphincterotome was passed over the guidewire and the bile       duct could only be superficially cannulated. Contrast was injected. I       personally interpreted the bile duct images. The flow of contrast       through the ducts was poor. Image quality was suboptimal. Contrast       extended to the distal main bile duct and what appeared to be the       gallbladder. Opacification of the lower third of the main duct was       successful. The lower third of the main duct contained a single severe       stenosis 20 mm in length. Unfortunately, after aproximately 90 minutes       of effort, neither the 0.035 inch or the 0.025 inch wire would pass       beyond the most distal duct, curling in what is most  likely distal tumor       tissue. Therefore, the bile duct could not be deeply cannulated and no       intervention could be taken. 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 Moderate Sedation:      GETA Recommendation:           IR consult for PTC and drain and/or stent.                           - IR for PTC Procedure Code(s):        --- Professional ---                           [phone], Endoscopic retrograde                            cholangiopancreatography (ERCP); diagnostic,                            including collection of specimen(s) by brushing or                            washing, when performed (separate procedure) Diagnosis Code(s):        --- Professional ---                           K83.1, Obstruction of bile duct CPT copyright 2016 [employer]. All rights reserved. The codes documented in this report are preliminary and upon coder review may  be revised to meet current compliance requirements. Henry L. Loletha Carrow, MD 10/24/2016 2:42:47 PM This report has been signed electronically. Number of Addenda: 0

## 2016-10-24 NOTE — Transfer of Care (Signed)
Immediate Anesthesia Transfer of Care Note  Patient: Jimmy Myers  Procedure(s) Performed: Procedure(s): ENDOSCOPIC RETROGRADE CHOLANGIOPANCREATOGRAPHY (ERCP) WITH PROPOFOL (N/A)  Patient Location: Endoscopy Unit  Anesthesia Type:General  Level of Consciousness: sedated, patient cooperative and responds to stimulation  Airway & Oxygen Therapy: Patient Spontanous Breathing and Patient connected to nasal cannula oxygen  Post-op Assessment: Report given to RN and Post -op Vital signs reviewed and stable  Post vital signs: Reviewed and stable  Last Vitals:  Vitals:   10/24/16 1118 10/24/16 1450  BP: 122/74   Pulse: 87 76  Resp: (!) 25 (!) 27  Temp: 36.8 C     Last Pain:  Vitals:   10/24/16 1118  TempSrc: Oral  PainSc:          Complications: No apparent anesthesia complications

## 2016-10-25 ENCOUNTER — Other Ambulatory Visit: Payer: Self-pay

## 2016-10-25 ENCOUNTER — Inpatient Hospital Stay (HOSPITAL_COMMUNITY): Payer: PPO

## 2016-10-25 ENCOUNTER — Encounter (HOSPITAL_COMMUNITY): Payer: Self-pay | Admitting: Interventional Radiology

## 2016-10-25 DIAGNOSIS — R17 Unspecified jaundice: Secondary | ICD-10-CM

## 2016-10-25 HISTORY — PX: IR GENERIC HISTORICAL: IMG1180011

## 2016-10-25 LAB — CBC WITH DIFFERENTIAL/PLATELET
Basophils Absolute: 0 10*3/uL (ref 0.0–0.1)
Basophils Relative: 0 %
EOS PCT: 0 %
Eosinophils Absolute: 0 10*3/uL (ref 0.0–0.7)
HCT: 30.3 % — ABNORMAL LOW (ref 39.0–52.0)
Hemoglobin: 10.1 g/dL — ABNORMAL LOW (ref 13.0–17.0)
LYMPHS PCT: 11 %
Lymphs Abs: 0.8 10*3/uL (ref 0.7–4.0)
MCH: 31.5 pg (ref 26.0–34.0)
MCHC: 33.3 g/dL (ref 30.0–36.0)
MCV: 94.4 fL (ref 78.0–100.0)
MONO ABS: 0.3 10*3/uL (ref 0.1–1.0)
Monocytes Relative: 4 %
Neutro Abs: 6 10*3/uL (ref 1.7–7.7)
Neutrophils Relative %: 85 %
PLATELETS: 63 10*3/uL — AB (ref 150–400)
RBC: 3.21 MIL/uL — ABNORMAL LOW (ref 4.22–5.81)
RDW: 15.2 % (ref 11.5–15.5)
WBC: 7.1 10*3/uL (ref 4.0–10.5)

## 2016-10-25 LAB — COMPREHENSIVE METABOLIC PANEL
ALT: 94 U/L — ABNORMAL HIGH (ref 17–63)
ANION GAP: 9 (ref 5–15)
AST: 86 U/L — AB (ref 15–41)
Albumin: 2 g/dL — ABNORMAL LOW (ref 3.5–5.0)
Alkaline Phosphatase: 323 U/L — ABNORMAL HIGH (ref 38–126)
BUN: 27 mg/dL — AB (ref 6–20)
CHLORIDE: 107 mmol/L (ref 101–111)
CO2: 23 mmol/L (ref 22–32)
Calcium: 8.3 mg/dL — ABNORMAL LOW (ref 8.9–10.3)
Creatinine, Ser: 1.18 mg/dL (ref 0.61–1.24)
GFR, EST NON AFRICAN AMERICAN: 56 mL/min — AB (ref >60)
Glucose, Bld: 172 mg/dL — ABNORMAL HIGH (ref 65–99)
POTASSIUM: 4.2 mmol/L (ref 3.5–5.1)
Sodium: 139 mmol/L (ref 135–145)
Total Bilirubin: 14.9 mg/dL — ABNORMAL HIGH (ref 0.3–1.2)
Total Protein: 5 g/dL — ABNORMAL LOW (ref 6.5–8.1)

## 2016-10-25 LAB — PROTIME-INR
INR: 1.01
PROTHROMBIN TIME: 13.3 s (ref 11.4–15.2)

## 2016-10-25 MED ORDER — PHENYLEPHRINE HCL 2.5 % OP SOLN: 1.0000 [drp] | OPHTHALMIC | Status: DC

## 2016-10-25 MED ORDER — ONDANSETRON HCL 4 MG/2ML IJ SOLN
INTRAMUSCULAR | Status: AC
  Filled 2016-10-25: qty 2

## 2016-10-25 MED ORDER — FENTANYL CITRATE (PF) 100 MCG/2ML IJ SOLN
INTRAMUSCULAR | Status: AC | PRN
  Administered 2016-10-25: 25 ug via INTRAVENOUS
  Administered 2016-10-25: 12.5 ug via INTRAVENOUS

## 2016-10-25 MED ORDER — SODIUM CHLORIDE 0.9 % IV SOLN
3.0000 g | Freq: Three times a day (TID) | INTRAVENOUS | Status: DC
  Administered 2016-10-25 – 2016-10-26 (×4): 3 g via INTRAVENOUS
  Filled 2016-10-25 (×5): qty 3

## 2016-10-25 MED ORDER — TETRACAINE HCL 0.5 % OP SOLN: 1.0000 [drp] | OPHTHALMIC | Status: DC

## 2016-10-25 MED ORDER — LIDOCAINE HCL 1 % IJ SOLN
INTRAMUSCULAR | Status: AC | PRN
  Administered 2016-10-25: 5 mL

## 2016-10-25 MED ORDER — CYCLOPENTOLATE-PHENYLEPHRINE 0.2-1 % OP SOLN
1.0000 [drp] | OPHTHALMIC | Status: DC
  Filled 2016-10-25: qty 2

## 2016-10-25 MED ORDER — LIDOCAINE HCL (PF) 1 % IJ SOLN
INTRAMUSCULAR | Status: AC
  Filled 2016-10-25: qty 30

## 2016-10-25 MED ORDER — FENTANYL CITRATE (PF) 100 MCG/2ML IJ SOLN
INTRAMUSCULAR | Status: AC
  Filled 2016-10-25: qty 2

## 2016-10-25 MED ORDER — LIDOCAINE HCL 3.5 % OP GEL: 1.0000 "application " | Freq: Once | OPHTHALMIC | Status: DC

## 2016-10-25 MED ORDER — MIDAZOLAM HCL 2 MG/2ML IJ SOLN
INTRAMUSCULAR | Status: AC
  Filled 2016-10-25: qty 2

## 2016-10-25 MED ORDER — ONDANSETRON HCL 4 MG/2ML IJ SOLN
INTRAMUSCULAR | Status: AC | PRN
  Administered 2016-10-25: 4 mg via INTRAVENOUS

## 2016-10-25 MED ORDER — IOPAMIDOL (ISOVUE-300) INJECTION 61%
INTRAVENOUS | Status: AC
  Administered 2016-10-25: 20 mL
  Filled 2016-10-25: qty 50

## 2016-10-25 MED ORDER — MIDAZOLAM HCL 2 MG/2ML IJ SOLN
INTRAMUSCULAR | Status: AC | PRN
  Administered 2016-10-25 (×2): 0.5 mg via INTRAVENOUS

## 2016-10-25 NOTE — H&P (Signed)
Chief Complaint: Patient was seen in consultation today for  Chief Complaint  Patient presents with  . Jaundice  . Elevated Hepatic Enzymes     Referring Physician(s): DANIS III, Estill Cotta  Supervising Physician: Daryll Brod  Patient Status: [facility] - In-pt  History of Present Illness: Jimmy Myers is a 83 y.o. male with a past medical history of chronic A. fib with prior cardioversion, bradycardia, enlarged ascending aorta, history of atrial thrombus in 2013, lymphoma, combined systolic and diastolic congestive heart failure, hypertension, chronic thrombocytopenia who was admitted to the hospital on 10/22/2016 for progressively worse jaundice.   He is anticoagulated on Coumadin for Afib.  MRCP = Abrupt cut off of the common bile duct at or immediately beyond the junction of the common hepatic duct and cystic duct with moderate proximal biliary dilatation. The exact source of obstruction is unclear on today's examination, with differential considerations including an obstructing ductal stone (which is simply not visualized secondary to technical limitations on today's examination), ductal adenocarcinoma, or obstructing pancreatic head lesion (which is not visualized on today's examination secondary to technical limitations).  ERCP was attempted but unable to pass wire due to tumor.  We are asked to perform percutaneous transhepatic cholangiography and biliary drainage.  He is NPO. Coumadin held and INR is 1 today.  Past Medical History:  Diagnosis Date  . A-fib [facility]) May 2013   s/p TEE/cardioversion June 2013  . Anxiety   . Aortic root enlargement (HCC)    at least 5cm by CT  . Arthritis   . Atrial thrombus   . Bradycardia   . Bronchitis   . Cancer ([place]) 2016   lymphoma  . Chronic anticoagulation    on coumadin - checked in [place]  . Congestive heart failure (CHF) ([place]) 2013  . Diverticulosis   . Dysrhythmia    AFib  . GERD (gastroesophageal reflux disease)   .  Hemorrhoids   . HTN (hypertension)   . Pneumonia May 2013  . Pneumonia 2013  . 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 Thrombocytopenia ([place])     Past Surgical History:  Procedure Laterality Date  . CARDIOVERSION  12/30/2011   Procedure: CARDIOVERSION;  Surgeon: Lelon Perla, MD;  Location: [facility] ENDOSCOPY;  Service: Cardiovascular;  Laterality: N/A;  . CARDIOVERSION  02/16/2012   Procedure: CARDIOVERSION;  Surgeon: Lelon Perla, MD;  Location: [facility] ENDOSCOPY;  Service: Cardiovascular;  Laterality: N/A;  . CATARACT EXTRACTION W/PHACO Left 07/28/2016   Procedure: CATARACT EXTRACTION PHACO AND INTRAOCULAR LENS PLACEMENT LEFT EYE:  CDE: 13.00;  Surgeon: Tonny Branch, MD;  Location: [facility]S;  Service: Ophthalmology;  Laterality: Left;  . ESOPHAGOGASTRODUODENOSCOPY  12/30/2011   Procedure: ESOPHAGOGASTRODUODENOSCOPY (EGD);  Surgeon: Lelon Perla, MD;  Location: [facility] ENDOSCOPY;  Service: Cardiovascular;  Laterality: N/A;  . ESOPHAGOGASTRODUODENOSCOPY  12/30/2011   Procedure: ESOPHAGOGASTRODUODENOSCOPY (EGD);  Surgeon: Lafayette Dragon, MD;  Location[facility] ENDOSCOPY;  Service: Endoscopy;  Laterality: N/A;  . KNEE ARTHROSCOPY  rt knee  . PROSTATE SURGERY    . TEE WITHOUT CARDIOVERSION  12/30/2011   Procedure: TRANSESOPHAGEAL ECHOCARDIOGRAM (TEE);  Surgeon: Lelon Perla, MD;  Location: [facility] ENDOSCOPY;  Service: Cardiovascular;  Laterality: N/A;  . TEE WITHOUT CARDIOVERSION  02/16/2012   Procedure: TRANSESOPHAGEAL ECHOCARDIOGRAM (TEE);  Surgeon: Lelon Perla, MD;  Location: [facility] ENDOSCOPY;  Service: Cardiovascular;  Laterality: N/A;    Allergies: Codeine; Simvastatin; and Vicodin [hydrocodone-acetaminophen]  Medications: Prior to Admission medications   Medication Sig Start Date End Date Taking? Authorizing Provider  albuterol (PROVENTIL HFA;VENTOLIN HFA) 108  (90 BASE) MCG/ACT inhaler Inhale 2 puffs into the lungs every 6 (six) hours as needed for wheezing or shortness of breath.   Yes Historical Provider, MD  clorazepate (TRANXENE) 3.75 MG tablet Take 1.875-3.75 mg by mouth daily as needed for anxiety.    Yes Historical Provider, MD  Emollient (EUCERIN) lotion Apply 3-5 mLs topically daily.    Yes Historical Provider, MD  fluticasone (FLONASE) 50 MCG/ACT nasal spray Place 1 spray into both nostrils daily as needed for allergies or rhinitis.   Yes Historical Provider, MD  folic acid (FOLVITE) 546 MCG tablet Take 800 mcg by mouth daily.   Yes Historical Provider, MD  furosemide (LASIX) 40 MG tablet TAKE ONE TABLET BY MOUTH ONCE DAILY 09/26/16  Yes Burtis Junes, NP  guaiFENesin (MUCINEX) 600 MG 12 hr tablet Take 600 mg by mouth 2 (two) times daily as needed.   Yes Historical Provider, MD  lisinopril (PRINIVIL,ZESTRIL) 5 MG tablet TAKE ONE TABLET BY MOUTH ONCE DAILY Patient taking differently: TAKE ONE TABLET BY MOUTH ONCE DAILY AT 5PM 06/27/16  Yes Burtis Junes, NP  loperamide (IMODIUM A-D) 2 MG tablet Take 2 mg by mouth 4 (four) times daily as needed for diarrhea or loose stools.   Yes Historical Provider, MD  nitroGLYCERIN (NITROSTAT) 0.4 MG SL tablet Place 1 tablet (0.4 mg total) under the tongue every 5 (five) minutes x 3 doses as needed for chest pain. 05/15/14 10/06/17 Yes Burtis Junes, NP  omeprazole (PRILOSEC) 20 MG capsule Take 20 mg by mouth daily before breakfast.    Yes Historical Provider, MD  phytonadione (VITAMIN K) 5 MG tablet Take 5 mg by mouth once.   Yes Historical Provider, MD  polyethylene glycol (MIRALAX / GLYCOLAX) packet Take 17 g by mouth at bedtime as needed.    Yes Historical Provider, MD  potassium chloride SA (K-DUR,KLOR-CON) 20 MEQ tablet Take 20 mEq by mouth 2 (two) times daily.   Yes Historical Provider, MD  warfarin (COUMADIN) 5 MG tablet Take as directed by coumadin clinic Patient taking differently: Take 2.5-5 mg by  mouth See admin instructions. [place] daily on all other days of the week 07/13/13  Yes JaThompson GrayerMD  cefdinir (OMNICEF) 300 MG capsule Take 300 mg by mouth 2 (two) times daily. 10/09/16   Historical Provider, MD  oseltamivir (TAMIFLU) 75 MG capsule Take 75 mg by mouth 2 (two) times daily. 10/09/16   Historical Provider, MD     Family History  Problem Relation Age of Onset  . Heart disease Mother   . Heart disease Father   . Stomach cancer Brother   . Heart disease Sister   . Heart disease Brother   . Hypertension Sister     Social History   Social History  . Marital status: Married    Spouse name: N/A  . Number of children: 2  . Years of education: N/A   Occupational History  . retired Retired   Social History Main Topics  . Smoking status: Never Smoker  . Smokeless tobacco: Never Used  . Alcohol use No  . Drug use: No  . Sexual activity: Not Currently    Birth control/ protection: None   Other Topics Concern  .  None   Social History Narrative  . None    Review of Systems: A 12 point ROS discussed  Review of Systems  Constitutional: Positive for activity change, appetite change and fatigue. Negative for chills and fever.  HENT: Negative.   Respiratory: Negative.   Cardiovascular: Negative.   Gastrointestinal: Positive for abdominal pain. Negative for nausea.  Genitourinary: Negative.   Musculoskeletal: Negative.   Skin: Positive for color change.  Neurological: Negative.   Hematological: Bruises/bleeds easily.  Psychiatric/Behavioral: Negative.     Vital Signs: BP (!) 104/52 (BP Location: Left Arm)   Pulse (!) 50   Temp 97.5 F (36.4 C) (Oral)   Resp 20   Ht _0  (1.778 m)   Wt 199 lb 9.6 oz (90.5 kg)   SpO2 92%   BMI 28.64 kg/m   Physical Exam  Constitutional: He is oriented to person, place, and time. He appears well-developed.  HENT:  Head: Normocephalic and atraumatic.  Eyes: EOM are normal.  Neck:  Normal range of motion.  Cardiovascular: Normal rate, regular rhythm and normal heart sounds.   Pulmonary/Chest: Effort normal and breath sounds normal. No respiratory distress. He has no wheezes.  Abdominal: Soft. He exhibits no distension. There is no tenderness.  Musculoskeletal: Normal range of motion.  Neurological: He is alert and oriented to person, place, and time.  Skin: Skin is warm and dry.  Psychiatric: He has a normal mood and affect. His behavior is normal. Judgment and thought content normal.  Vitals reviewed.   Mallampati Score:  MD Evaluation Airway: WNL Heart: WNL Abdomen: WNL Chest/ Lungs: WNL ASA  Classification: 3 Mallampati/Airway Score: One  Imaging: Ct Abdomen Pelvis W Contrast  Result Date: 10/22/2016 CLINICAL DATA:  83 y/o  M; elevated liver enzymes and jaundice. EXAM: CT ABDOMEN AND PELVIS WITH CONTRAST TECHNIQUE: Multidetector CT imaging of the abdomen and pelvis was performed using the standard protocol following bolus administration of intravenous contrast. CONTRAST:  141m ISOVUE-300 IOPAMIDOL (ISOVUE-300) INJECTION 61% COMPARISON:  08/16/2012 CT of the abdomen and pelvis. FINDINGS: Lower chest: Moderate cardiomegaly. Aortic valvular calcification. No acute abnormality. Hepatobiliary: Moderate intrahepatic biliary ductal dilatation, mild distention of the gallbladder, a distention of the common bile duct up to 19 mm (series 5, image 69). The common bile duct is abruptly cut off as it enters the head of the pancreas with soft tissue filling the lumen. Two small gallstones within the gallbladder. Pancreas: No main duct dilatation of the pancreas. No inflammatory change. Spleen: Normal in size without focal abnormality. Adrenals/Urinary Tract: Lucencies in the kidneys the largest arising exophytic from the upper pole of right kidney measuring 41 mm, likely representing cysts. Normal adrenal glands. No urinary stone disease or obstructive uropathy. Normal bladder.  Stomach/Bowel: Stomach is within normal limits. Appendix appears normal. No evidence of bowel wall thickening, distention, or inflammatory changes. Vascular/Lymphatic: Aortic atherosclerosis. No enlarged abdominal or pelvic lymph nodes. Reproductive: Mild prostate hypertrophy and calcifications. Other: No ascites. Musculoskeletal: No acute osseous abnormality. Multilevel degenerative changes of the spine with discogenic and prominent lower lumbar facet arthropathy. IMPRESSION: 1. Moderate intrahepatic biliary ductal dilatation, mild distention of the gallbladder, a distention of the common bile duct up to 19 mm. The common bile duct is abruptly cut off as it enters the head of the pancreas with soft tissue filling the lumen. 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 MRI/MRCP is recommended with and without intravenous contrast for further evaluation. 2. Moderate cardiomegaly. 3. Cholelithiasis. 4.  Aortic atherosclerosis. 5. Prostate hypertrophy. Electronically Signed   By: Kristine Garbe M.D.   On: 10/22/2016 22:29   Mr 3d Recon At Scanner  Result Date: 10/23/2016 CLINICAL DATA:  83 year old male with history of progressively worsening jaundice. No associated fever, abdominal pain, nausea, emesis, constipation, melena or hematochezia. Biliary tract ductal dilatation noted on recent CT examination. EXAM: MRI ABDOMEN WITHOUT AND WITH CONTRAST (INCLUDING MRCP) TECHNIQUE: Multiplanar multisequence MR imaging of the abdomen was performed both before and after the administration of intravenous contrast. Heavily T2-weighted images of the biliary and pancreatic ducts were obtained, and three-dimensional MRCP images were rendered by post processing. CONTRAST:  19m MULTIHANCE GADOBENATE DIMEGLUMINE 529 MG/ML IV SOLN COMPARISON:  No priors.  CT of the abdomen and pelvis 10/22/2016. FINDINGS: Lower chest: Cardiomegaly. Hepatobiliary: No discrete cystic or solid hepatic lesions.  MRCP images demonstrate moderate intra and extrahepatic biliary ductal dilatation which abruptly terminates at or just beyond the junction of the common hepatic and cystic ducts in the proximal common bile duct. The common hepatic duct measures up to 14 mm in diameter. The area of obstruction on MRCP images is poorly demonstrated secondary to central loss of signal intensity on MRCP images. Unfortunately, this region is also poorly evaluated on postcontrast imaging secondary to patient motion. No discrete mass is identified in this region. Gallbladder is moderately distended. Several tiny filling defects are noted in the gallbladder, compatible with tiny gallstones. Gallbladder wall does not appear thickened and there is no pericholecystic fluid or surrounding inflammatory changes to suggest an acute cholecystitis at this time. Pancreas: No discrete pancreatic mass identified in the pancreatic head to account for the common bile duct obstruction. There are some tiny T1 hypointense, T2 hyperintense, nonenhancing lesions in the distal body and tail of the pancreas, measuring up to 1 cm in diameter, presumably tiny pancreatic pseudocysts. No pancreatic ductal dilatation noted on MRCP images. No peripancreatic inflammatory changes. Spleen:  Unremarkable. Adrenals/Urinary Tract: Well-defined T1 hypointense, T2 hyperintense, nonenhancing lesions are noted in both kidneys, the largest of which is exophytic extending from the upper pole of the right kidney measuring up to 5.4 cm in diameter. No hydroureteronephrosis in the visualized portions of the abdomen. Bilateral adrenal glands are normal in appearance. Stomach/Bowel: Visualized portions are unremarkable. Vascular/Lymphatic: Aortic atherosclerosis, without definite aneurysm in the abdominal vasculature. No lymphadenopathy noted in the abdomen. Other: No significant volume of ascites noted in the visualized portions of the peritoneal cavity. Musculoskeletal: No  aggressive osseous lesions are noted in the visualized portions of the skeleton. IMPRESSION: 1. Abrupt cut off of the common bile duct at or immediately beyond the junction of the common hepatic duct and cystic duct with moderate proximal biliary dilatation. The exact source of obstruction is unclear on today's examination, with differential considerations including an obstructing ductal stone (which is simply not visualized secondary to technical limitations on today's examination), ductal adenocarcinoma, or obstructing pancreatic head lesion (which is not visualized on today's examination secondary to technical limitations). Further evaluation with ERCP is recommended if clinically appropriate. 2. Cholelithiasis without evidence of acute cholecystitis at this time. Electronically Signed   By: DVinnie LangtonM.D.   On: 10/23/2016 14:37   Dg C-arm 1-60 Min-no Report  Result Date: 10/24/2016 Fluoroscopy was utilized by the requesting physician.  No radiographic interpretation.   Mr Abdomen Mrcp WMoise BoringContast  Result Date: 10/23/2016 CLINICAL DATA:  83year old male with history of progressively worsening jaundice. No associated fever, abdominal pain, nausea, emesis, constipation, melena  or hematochezia. Biliary tract ductal dilatation noted on recent CT examination. EXAM: MRI ABDOMEN WITHOUT AND WITH CONTRAST (INCLUDING MRCP) TECHNIQUE: Multiplanar multisequence MR imaging of the abdomen was performed both before and after the administration of intravenous contrast. Heavily T2-weighted images of the biliary and pancreatic ducts were obtained, and three-dimensional MRCP images were rendered by post processing. CONTRAST:  76m MULTIHANCE GADOBENATE DIMEGLUMINE 529 MG/ML IV SOLN COMPARISON:  No priors.  CT of the abdomen and pelvis 10/22/2016. FINDINGS: Lower chest: Cardiomegaly. Hepatobiliary: No discrete cystic or solid hepatic lesions. MRCP images demonstrate moderate intra and extrahepatic biliary ductal  dilatation which abruptly terminates at or just beyond the junction of the common hepatic and cystic ducts in the proximal common bile duct. The common hepatic duct measures up to 14 mm in diameter. The area of obstruction on MRCP images is poorly demonstrated secondary to central loss of signal intensity on MRCP images. Unfortunately, this region is also poorly evaluated on postcontrast imaging secondary to patient motion. No discrete mass is identified in this region. Gallbladder is moderately distended. Several tiny filling defects are noted in the gallbladder, compatible with tiny gallstones. Gallbladder wall does not appear thickened and there is no pericholecystic fluid or surrounding inflammatory changes to suggest an acute cholecystitis at this time. Pancreas: No discrete pancreatic mass identified in the pancreatic head to account for the common bile duct obstruction. There are some tiny T1 hypointense, T2 hyperintense, nonenhancing lesions in the distal body and tail of the pancreas, measuring up to 1 cm in diameter, presumably tiny pancreatic pseudocysts. No pancreatic ductal dilatation noted on MRCP images. No peripancreatic inflammatory changes. Spleen:  Unremarkable. Adrenals/Urinary Tract: Well-defined T1 hypointense, T2 hyperintense, nonenhancing lesions are noted in both kidneys, the largest of which is exophytic extending from the upper pole of the right kidney measuring up to 5.4 cm in diameter. No hydroureteronephrosis in the visualized portions of the abdomen. Bilateral adrenal glands are normal in appearance. Stomach/Bowel: Visualized portions are unremarkable. Vascular/Lymphatic: Aortic atherosclerosis, without definite aneurysm in the abdominal vasculature. No lymphadenopathy noted in the abdomen. Other: No significant volume of ascites noted in the visualized portions of the peritoneal cavity. Musculoskeletal: No aggressive osseous lesions are noted in the visualized portions of the  skeleton. IMPRESSION: 1. Abrupt cut off of the common bile duct at or immediately beyond the junction of the common hepatic duct and cystic duct with moderate proximal biliary dilatation. The exact source of obstruction is unclear on today's examination, with differential considerations including an obstructing ductal stone (which is simply not visualized secondary to technical limitations on today's examination), ductal adenocarcinoma, or obstructing pancreatic head lesion (which is not visualized on today's examination secondary to technical limitations). Further evaluation with ERCP is recommended if clinically appropriate. 2. Cholelithiasis without evidence of acute cholecystitis at this time. Electronically Signed   By: DVinnie LangtonM.D.   On: 10/23/2016 14:37    Labs:  CBC:  Recent Labs  07/22/16 1227  10/22/16 1232 10/23/16 0607 10/24/16 0731 10/25/16 0520  WBC 4.5  < > 7.8 6.6 6.9 7.1  HGB 13.4  --   --  10.4* 9.7* 10.1*  HCT 41.1  < > 33.8* 31.9* 29.1* 30.3*  PLT 119*  < > 146* 121* 104* 63*  < > = values in this interval not displayed.  COAGS:  Recent Labs  10/22/16 2105 10/23/16 0607 10/24/16 0731 10/25/16 0520  INR 5.20* 4.62* 1.25 1.01    BMP:  Recent Labs  10/22/16 1232 10/23/16  8875 10/24/16 0731 10/25/16 0520  NA 130* 136 137 139  K 5.1 4.5 4.0 4.2  CL 100 102 108 107  CO2 _0 GLUCOSE 95 96 98 172*  BUN 21 18 22* 27*  CALCIUM 8.6 8.5* 7.8* 8.3*  CREATININE 1.15 1.28* 1.22 1.18  GFRNONAA 59* 50* 53* 56*  GFRAA 68 58* >60 >60    LIVER FUNCTION TESTS:  Recent Labs  10/22/16 1232 10/23/16 0607 10/24/16 0731 10/25/16 0520  BILITOT 8.5* 10.4* 13.1* 14.9*  AST 113* 101* 91* 86*  ALT 144* 116* 99* 94*  ALKPHOS 562* 387* 346* 323*  PROT 5.3* 5.3* 4.7* 5.0*  ALBUMIN 3.4* 2.3* 1.9* 2.0*    TUMOR MARKERS:  Recent Labs  10/23/16 1535  CA199 1,581*    Assessment and Plan:  Biliary obstruction  ERCP unsuccessful  Will  proceed with PTC and biliary drain today by Dr. Annamaria Boots.  Risks and Benefits discussed with the patient including, but not limited to bleeding, infection which may lead to sepsis or even death and damage to adjacent structures.  All of the patient's questions were answered, patient is agreeable to proceed. Consent signed and in chart.  Thank you for this interesting consult.  I greatly enjoyed meeting Jimmy Myers and look forward to participating in their care.  A copy of this report was sent to the requesting provider on this date.  Electronically Signed: Murrell Redden  PA-C 10/25/2016, 9:37 AM   I spent a total of 40 Minutes in face to face in clinical consultation, greater than 50% of which was counseling/coordinating care for PTC/Biliary drain      '

## 2016-10-25 NOTE — Evaluation (Signed)
Physical Therapy Evaluation Patient Details Name: Jimmy Myers MRN: [id number] DOB: 09-02-1931 Today's Date: 10/25/2016   History of Present Illness  Pt is an 83 y/o male admitted from home secondary to worsening jaundice (?carcinoma). ERCP performed on 10/24/16 with another endoscopy scheduled for 2/24. PMH including but not limited to a-fib, CHF, HTN and lymphoma.   Clinical Impression  Pt presented supine in bed with HOB slightly elevated, awake and willing to participate in therapy session. Prior to admission, pt reported that he occasionally used a SPC to ambulate with in his community and was independent with ADLs. Pt currently requires supervision with bed mobility, min guard for transfers and min A for balance with ambulation. Pt would benefit from use of an AD, and therapist encouraged wife and pt to use SPC or RW upon d/c. Pt would continue to benefit from skilled physical therapy services at this time while admitted and after d/c to address his below listed limitations in order to improve his overall safety and independence with functional mobility.      Follow Up Recommendations Home health PT;Supervision/Assistance - 24 hour    Equipment Recommendations  None recommended by PT;Other (comment) (pt has all DME at home; encouraged wife to have pt use RW)    Recommendations for Other Services       Precautions / Restrictions Restrictions Weight Bearing Restrictions: No      Mobility  Bed Mobility Overal bed mobility: Needs Assistance Bed Mobility: Supine to Sit     Supine to sit: Supervision     General bed mobility comments: increased time, supervision for safety  Transfers Overall transfer level: Needs assistance Equipment used: None Transfers: Sit to/from Stand Sit to Stand: Min guard         General transfer comment: increased time, mildly unsteady with rising from bed, min guard for safety  Ambulation/Gait Ambulation/Gait assistance: Min assist Ambulation  Distance (Feet): 40 Feet Assistive device: 1 person hand held assist Gait Pattern/deviations: Step-to pattern;Step-through pattern;Decreased step length - right;Decreased step length - left;Decreased stride length;Shuffle;Narrow base of support Gait velocity: decreased Gait velocity interpretation: Below normal speed for age/gender General Gait Details: pt moderately unsteady with gait, min A for stability, would benefit from use of AD but refusing at this time.  Stairs            Wheelchair Mobility    Modified Rankin (Stroke Patients Only)       Balance Overall balance assessment: Needs assistance Sitting-balance support: Feet supported Sitting balance-Leahy Scale: Good     Standing balance support: During functional activity;Single extremity supported Standing balance-Leahy Scale: Poor Standing balance comment: pt reliant on at least one UE support                              Pertinent Vitals/Pain Pain Assessment: No/denies pain    Home Living Family/patient expects to be discharged to:: Private residence Living Arrangements: Spouse/significant other Available Help at Discharge: Family;Available 24 hours/day Type of Home: House Home Access: Ramped entrance     Home Layout: One level Home Equipment: Walker - 4 wheels;Cane - single point      Prior Function Level of Independence: Independent with assistive device(s)         Comments: pt reported that he uses a SPC when ambulating within his community     Hand Dominance   Dominant Hand: Right    Extremity/Trunk Assessment   Upper Extremity Assessment Upper  Extremity Assessment: Overall WFL for tasks assessed    Lower Extremity Assessment Lower Extremity Assessment: Generalized weakness       Communication   Communication: No difficulties  Cognition Arousal/Alertness: Awake/alert Behavior During Therapy: WFL for tasks assessed/performed Overall Cognitive Status: Within Functional  Limits for tasks assessed                      General Comments      Exercises     Assessment/Plan    PT Assessment Patient needs continued PT services  PT Problem List Decreased strength;Decreased activity tolerance;Decreased balance;Decreased mobility;Decreased coordination;Decreased knowledge of use of DME;Decreased safety awareness       PT Treatment Interventions DME instruction;Gait training;Stair training;Functional mobility training;Therapeutic activities;Therapeutic exercise;Balance training;Neuromuscular re-education;Patient/family education    PT Goals (Current goals can be found in the Care Plan section)  Acute Rehab PT Goals Patient Stated Goal: return home PT Goal Formulation: With patient/family Time For Goal Achievement: 11/08/16 Potential to Achieve Goals: Good    Frequency Min 3X/week   Barriers to discharge        Co-evaluation               End of Session Equipment Utilized During Treatment: Gait belt Activity Tolerance: Patient tolerated treatment well Patient left: in chair;with call bell/phone within reach;with chair alarm set;with family/visitor present Nurse Communication: Mobility status PT Visit Diagnosis: Unsteadiness on feet (R26.81);Other abnormalities of gait and mobility (R26.89)         Time: [id number] PT Time Calculation (min) (ACUTE ONLY): 16 min   Charges:   PT Evaluation $PT Eval Moderate Complexity: 1 Procedure     PT G CodesClearnce Sorrel Adalene Gulotta 10/25/2016, 9:32 AM Sherie Don, PT, DPT [phone]

## 2016-10-25 NOTE — Sedation Documentation (Signed)
Patient denies pain and is resting comfortably.  

## 2016-10-25 NOTE — Progress Notes (Signed)
[facility] PROGRESS NOTE  Jimmy Myers GOT:[id number] DOB: 12-18-1931 DOA: 10/22/2016   PCP: Kenn File, MD     Assessment/Plan: Principal Problem:   Jaundice Active Problems:   Chronic systolic heart failure (HCC)   Atrial fibrillation   HTN (hypertension), benign   Supratherapeutic INR   GERD (gastroesophageal reflux disease)   Anxiety   Common bile duct stricture   Jimmy Myers is a 83 y.o. male with medical history significant of chronic atrial fibrillation, anxiety, aortic root enlargement, osteoarthritis, history of atrial thrombus, lymphoma, chronic systolic congestive heart failure, diverticulosis, GERD, hemorrhoids, hypertension, history of pneumonia, recently treated for influenza who is being referred to the emergency department by his primary care provider due to progressively worse jaundice. CT scan abdomen/pelvis showed moderate intrahepatic biliary ductal dilation, mild distention of the gallbladder, distention of the CBD suspicious for CBD or pancreatic carcinoma. ERCP unsuccessful. IR consulted for stent placement. Cardiology consulted for further recommendations regarding anticoagulation.   Assessment and plan Obstructive Jaundice worrisome for cholangiocarcinoma vs pancreatic cancer GI consulted  for   ERCP,  Famotidine 20 mg IVPB every 12 hours. Zofran 4 mg every 6 hours as needed for nausea. [id number], markedly elevated  GI unable to successfully place stent ,IR consult for PTC and drain and/or stent  S/p [place] AND FLUORO RT 10 FR INT EXT BILIARY DRAIN Total Bilirubin continues to increase, alkaline phosphatase somewhat improved     Chronic systolic heart failure ([place]) Compensated.EF previously down to 20 to 25% with improvement after medical therapy - up to 50% per echo in August of 2013 and was 45 to 50% by echo in July of 2014 continue to hold ACE inhibitor pending workup  atrial fibrillation Not on rate control agent. INR  supratherapeutic  Like due to haptic coagulaopathy, normalized with vitamin K Would need to consider risk versus benefit of long-term anticoagulation,cards contacted to help with this decison     HTN (hypertension), benign Stable. Resume lisinopril once workup is completed Monitor blood pressure. May need IV medications while nothing by mouth.    Supratherapeutic INR Received vitamin K yesterday. Monitor PT and INR daily.now 4.62>1.25>1.0    GERD (gastroesophageal reflux disease) Famotidine 20 mg IVPB every 12 hours    Anxiety Lorazepam 0.5 mg prn     DVT prophylaxsis Lovenox once INR <2.0  Code Status:  Previously a DO NOT RESUSCITATE    Family Communication: Discussed in detail with the patient/wife, all imaging results, lab results explained to the patient   Disposition Plan: As per GI consult, cardiology, interventional radiology     Consultants:  GI  cards  Procedures:  None  Antibiotics: Anti-infectives    Start     Dose/Rate Route Frequency Ordered Stop   10/25/16 1030  Ampicillin-Sulbactam (UNASYN) 3 g in sodium chloride 0.9 % 100 mL IVPB     3 g 200 mL/hr over 30 Minutes Intravenous Every 8 hours 10/25/16 0932 10/26/16 0229   10/24/16 1700  Ampicillin-Sulbactam (UNASYN) 3 g in sodium chloride 0.9 % 100 mL IVPB     3 g 200 mL/hr over 30 Minutes Intravenous Every 6 hours 10/24/16 1048 10/24/16 2233   10/24/16 1030  Ampicillin-Sulbactam (UNASYN) 3 g in sodium chloride 0.9 % 100 mL IVPB     3 g 200 mL/hr over 30 Minutes Intravenous  Once 10/24/16 0719 10/24/16 1225         HPI/Subjective: Denies nausea vomiting abdominal pain,  Objective: Vitals:   10/24/16 1520 10/24/16 1721 10/24/16 2207 10/25/16 0430  BP: 132/64 131/65 122/66 (!) 104/52  Pulse: 64 66 66 (!) 50  Resp: (!) 22 (!) _0 Temp: 98.5 F (36.9 C) 98.7 F (37.1 C) 98.7 F (37.1 C) 97.5 F (36.4 C)  TempSrc: Oral Oral Oral Oral  SpO2: 98% 98% 98% 92%  Weight:    90.5 kg (199 lb 9.6 oz)   Height:        Intake/Output Summary (Last 24 hours) at 10/25/16 0941 Last data filed at 10/25/16 0435  Gross per 24 hour  Intake             2100 ml  Output              455 ml  Net             1645 ml    Exam:  Examination:  Cardiovascular: irregularly irregular, no murmurs / rubs / gallops. No extremity edema. 2+ pedal pulses. No carotid bruits.  Abdomen: Soft,no tenderness, no masses palpated. No hepatosplenomegaly. Bowel sounds positive.  Musculoskeletal: no clubbing / cyanosis. Good ROM, no contractures. Normal muscle tone.  Skin: Skin is jaundiced. Neurologic: CN 2-12 grossly intact. Sensation intact, DTR normal. Strength 5/5 in all 4.  Psychiatric: Normal judgment and insight. Alert and oriented x 3. Normal mood.     Data Reviewed: I have personally reviewed following labs and imaging studies  Micro Results No results found for this or any previous visit (from the past 240 hour(s)).  Radiology Reports Ct Abdomen Pelvis W Contrast  Result Date: 10/22/2016 CLINICAL DATA:  83 y/o  M; elevated liver enzymes and jaundice. EXAM: CT ABDOMEN AND PELVIS WITH CONTRAST TECHNIQUE: Multidetector CT imaging of the abdomen and pelvis was performed using the standard protocol following bolus administration of intravenous contrast. CONTRAST:  166m ISOVUE-300 IOPAMIDOL (ISOVUE-300) INJECTION 61% COMPARISON:  08/16/2012 CT of the abdomen and pelvis. FINDINGS: Lower chest: Moderate cardiomegaly. Aortic valvular calcification. No acute abnormality. Hepatobiliary: Moderate intrahepatic biliary ductal dilatation, mild distention of the gallbladder, a distention of the common bile duct up to 19 mm (series 5, image 69). The common bile duct is abruptly cut off as it enters the head of the pancreas with soft tissue filling the lumen. Two small gallstones within the gallbladder. Pancreas: No main duct dilatation of the pancreas. No inflammatory change. Spleen: Normal in size  without focal abnormality. Adrenals/Urinary Tract: Lucencies in the kidneys the largest arising exophytic from the upper pole of right kidney measuring 41 mm, likely representing cysts. Normal adrenal glands. No urinary stone disease or obstructive uropathy. Normal bladder. Stomach/Bowel: Stomach is within normal limits. Appendix appears normal. No evidence of bowel wall thickening, distention, or inflammatory changes. Vascular/Lymphatic: Aortic atherosclerosis. No enlarged abdominal or pelvic lymph nodes. Reproductive: Mild prostate hypertrophy and calcifications. Other: No ascites. Musculoskeletal: No acute osseous abnormality. Multilevel degenerative changes of the spine with discogenic and prominent lower lumbar facet arthropathy. IMPRESSION: 1. Moderate intrahepatic biliary ductal dilatation, mild distention of the gallbladder, a distention of the common bile duct up to 19 mm. The common bile duct is abruptly cut off as it enters the head of the pancreas with soft tissue filling the lumen. 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 MRI/MRCP is recommended with and without intravenous contrast for further evaluation. 2. Moderate cardiomegaly. 3. Cholelithiasis. 4. Aortic atherosclerosis. 5. Prostate hypertrophy. Electronically Signed   By: LEdgardo RoysD.  On: 10/22/2016 22:29   Mr 3d Recon At Scanner  Result Date: 10/23/2016 CLINICAL DATA:  83 year old male with history of progressively worsening jaundice. No associated fever, abdominal pain, nausea, emesis, constipation, melena or hematochezia. Biliary tract ductal dilatation noted on recent CT examination. EXAM: MRI ABDOMEN WITHOUT AND WITH CONTRAST (INCLUDING MRCP) TECHNIQUE: Multiplanar multisequence MR imaging of the abdomen was performed both before and after the administration of intravenous contrast. Heavily T2-weighted images of the biliary and pancreatic ducts were obtained, and three-dimensional  MRCP images were rendered by post processing. CONTRAST:  27m MULTIHANCE GADOBENATE DIMEGLUMINE 529 MG/ML IV SOLN COMPARISON:  No priors.  CT of the abdomen and pelvis 10/22/2016. FINDINGS: Lower chest: Cardiomegaly. Hepatobiliary: No discrete cystic or solid hepatic lesions. MRCP images demonstrate moderate intra and extrahepatic biliary ductal dilatation which abruptly terminates at or just beyond the junction of the common hepatic and cystic ducts in the proximal common bile duct. The common hepatic duct measures up to 14 mm in diameter. The area of obstruction on MRCP images is poorly demonstrated secondary to central loss of signal intensity on MRCP images. Unfortunately, this region is also poorly evaluated on postcontrast imaging secondary to patient motion. No discrete mass is identified in this region. Gallbladder is moderately distended. Several tiny filling defects are noted in the gallbladder, compatible with tiny gallstones. Gallbladder wall does not appear thickened and there is no pericholecystic fluid or surrounding inflammatory changes to suggest an acute cholecystitis at this time. Pancreas: No discrete pancreatic mass identified in the pancreatic head to account for the common bile duct obstruction. There are some tiny T1 hypointense, T2 hyperintense, nonenhancing lesions in the distal body and tail of the pancreas, measuring up to 1 cm in diameter, presumably tiny pancreatic pseudocysts. No pancreatic ductal dilatation noted on MRCP images. No peripancreatic inflammatory changes. Spleen:  Unremarkable. Adrenals/Urinary Tract: Well-defined T1 hypointense, T2 hyperintense, nonenhancing lesions are noted in both kidneys, the largest of which is exophytic extending from the upper pole of the right kidney measuring up to 5.4 cm in diameter. No hydroureteronephrosis in the visualized portions of the abdomen. Bilateral adrenal glands are normal in appearance. Stomach/Bowel: Visualized portions are  unremarkable. Vascular/Lymphatic: Aortic atherosclerosis, without definite aneurysm in the abdominal vasculature. No lymphadenopathy noted in the abdomen. Other: No significant volume of ascites noted in the visualized portions of the peritoneal cavity. Musculoskeletal: No aggressive osseous lesions are noted in the visualized portions of the skeleton. IMPRESSION: 1. Abrupt cut off of the common bile duct at or immediately beyond the junction of the common hepatic duct and cystic duct with moderate proximal biliary dilatation. The exact source of obstruction is unclear on today's examination, with differential considerations including an obstructing ductal stone (which is simply not visualized secondary to technical limitations on today's examination), ductal adenocarcinoma, or obstructing pancreatic head lesion (which is not visualized on today's examination secondary to technical limitations). Further evaluation with ERCP is recommended if clinically appropriate. 2. Cholelithiasis without evidence of acute cholecystitis at this time. Electronically Signed   By: DVinnie LangtonM.D.   On: 10/23/2016 14:37   Dg C-arm 1-60 Min-no Report  Result Date: 10/24/2016 Fluoroscopy was utilized by the requesting physician.  No radiographic interpretation.   Mr Abdomen Mrcp WMoise BoringContast  Result Date: 10/23/2016 CLINICAL DATA:  83year old male with history of progressively worsening jaundice. No associated fever, abdominal pain, nausea, emesis, constipation, melena or hematochezia. Biliary tract ductal dilatation noted on recent CT examination. EXAM: MRI ABDOMEN WITHOUT AND  WITH CONTRAST (INCLUDING MRCP) TECHNIQUE: Multiplanar multisequence MR imaging of the abdomen was performed both before and after the administration of intravenous contrast. Heavily T2-weighted images of the biliary and pancreatic ducts were obtained, and three-dimensional MRCP images were rendered by post processing. CONTRAST:  57m MULTIHANCE  GADOBENATE DIMEGLUMINE 529 MG/ML IV SOLN COMPARISON:  No priors.  CT of the abdomen and pelvis 10/22/2016. FINDINGS: Lower chest: Cardiomegaly. Hepatobiliary: No discrete cystic or solid hepatic lesions. MRCP images demonstrate moderate intra and extrahepatic biliary ductal dilatation which abruptly terminates at or just beyond the junction of the common hepatic and cystic ducts in the proximal common bile duct. The common hepatic duct measures up to 14 mm in diameter. The area of obstruction on MRCP images is poorly demonstrated secondary to central loss of signal intensity on MRCP images. Unfortunately, this region is also poorly evaluated on postcontrast imaging secondary to patient motion. No discrete mass is identified in this region. Gallbladder is moderately distended. Several tiny filling defects are noted in the gallbladder, compatible with tiny gallstones. Gallbladder wall does not appear thickened and there is no pericholecystic fluid or surrounding inflammatory changes to suggest an acute cholecystitis at this time. Pancreas: No discrete pancreatic mass identified in the pancreatic head to account for the common bile duct obstruction. There are some tiny T1 hypointense, T2 hyperintense, nonenhancing lesions in the distal body and tail of the pancreas, measuring up to 1 cm in diameter, presumably tiny pancreatic pseudocysts. No pancreatic ductal dilatation noted on MRCP images. No peripancreatic inflammatory changes. Spleen:  Unremarkable. Adrenals/Urinary Tract: Well-defined T1 hypointense, T2 hyperintense, nonenhancing lesions are noted in both kidneys, the largest of which is exophytic extending from the upper pole of the right kidney measuring up to 5.4 cm in diameter. No hydroureteronephrosis in the visualized portions of the abdomen. Bilateral adrenal glands are normal in appearance. Stomach/Bowel: Visualized portions are unremarkable. Vascular/Lymphatic: Aortic atherosclerosis, without definite  aneurysm in the abdominal vasculature. No lymphadenopathy noted in the abdomen. Other: No significant volume of ascites noted in the visualized portions of the peritoneal cavity. Musculoskeletal: No aggressive osseous lesions are noted in the visualized portions of the skeleton. IMPRESSION: 1. Abrupt cut off of the common bile duct at or immediately beyond the junction of the common hepatic duct and cystic duct with moderate proximal biliary dilatation. The exact source of obstruction is unclear on today's examination, with differential considerations including an obstructing ductal stone (which is simply not visualized secondary to technical limitations on today's examination), ductal adenocarcinoma, or obstructing pancreatic head lesion (which is not visualized on today's examination secondary to technical limitations). Further evaluation with ERCP is recommended if clinically appropriate. 2. Cholelithiasis without evidence of acute cholecystitis at this time. Electronically Signed   By: DVinnie LangtonM.D.   On: 10/23/2016 14:37     CBC  Recent Labs Lab 10/21/16 1010 10/22/16 1232 10/23/16 0607 10/24/16 0731 10/25/16 0520  WBC 6.0 7.8 6.6 6.9 7.1  HGB  --   --  10.4* 9.7* 10.1*  HCT 35.9* 33.8* 31.9* 29.1* 30.3*  PLT 166 146* 121* 104* 63*  MCV 97 91 96.1 93.9 94.4  MCH 30.6 32.2 31.3 31.3 31.5  MCHC 31.5 35.5 32.6 33.3 33.3  RDW 14.6 14.3 15.3 15.2 15.2  LYMPHSABS 1.2 1.3 1.8 1.3 0.8  MONOABS  --   --  0.8 0.9 0.3  EOSABS 0.1 0.0 0.0 0.0 0.0  BASOSABS 0.0 0.0 0.0 0.0 0.0    Chemistries   Recent Labs Lab  10/22/16 1232 10/23/16 0607 10/24/16 0731 10/25/16 0520  NA 130* 136 137 139  K 5.1 4.5 4.0 4.2  CL 100 102 108 107  CO2 _0 GLUCOSE 95 96 98 172*  BUN 21 18 22* 27*  CREATININE 1.15 1.28* 1.22 1.18  CALCIUM 8.6 8.5* 7.8* 8.3*  AST 113* 101* 91* 86*  ALT 144* 116* 99* 94*  ALKPHOS 562* 387* 346* 323*  BILITOT 8.5* 10.4* 13.1* 14.9*    ------------------------------------------------------------------------------------------------------------------ estimated creatinine clearance is 54.6 mL/min (by C-G formula based on SCr of 1.18 mg/dL). ------------------------------------------------------------------------------------------------------------------ No results for input(s): HGBA1C in the last 72 hours. ------------------------------------------------------------------------------------------------------------------ No results for input(s): CHOL, HDL, LDLCALC, TRIG, CHOLHDL, LDLDIRECT in the last 72 hours. ------------------------------------------------------------------------------------------------------------------ No results for input(s): TSH, T4TOTAL, T3FREE, THYROIDAB in the last 72 hours.  Invalid input(s): FREET3 ------------------------------------------------------------------------------------------------------------------ No results for input(s): VITAMINB12, FOLATE, FERRITIN, TIBC, IRON, RETICCTPCT in the last 72 hours.  Coagulation profile  Recent Labs Lab 10/21/16 1011 10/22/16 2105 10/23/16 0607 10/24/16 0731 10/25/16 0520  INR >8.0* 5.20* 4.62* 1.25 1.01    No results for input(s): DDIMER in the last 72 hours.  Cardiac Enzymes No results for input(s): CKMB, TROPONINI, MYOGLOBIN in the last 168 hours.  Invalid input(s): CK ------------------------------------------------------------------------------------------------------------------ Invalid input(s): POCBNP   CBG: No results for input(s): GLUCAP in the last 168 hours.     Studies: Mr 3d Recon At Scanner  Result Date: 10/23/2016 CLINICAL DATA:  83 year old male with history of progressively worsening jaundice. No associated fever, abdominal pain, nausea, emesis, constipation, melena or hematochezia. Biliary tract ductal dilatation noted on recent CT examination. EXAM: MRI ABDOMEN WITHOUT AND WITH CONTRAST (INCLUDING MRCP) TECHNIQUE:  Multiplanar multisequence MR imaging of the abdomen was performed both before and after the administration of intravenous contrast. Heavily T2-weighted images of the biliary and pancreatic ducts were obtained, and three-dimensional MRCP images were rendered by post processing. CONTRAST:  86m MULTIHANCE GADOBENATE DIMEGLUMINE 529 MG/ML IV SOLN COMPARISON:  No priors.  CT of the abdomen and pelvis 10/22/2016. FINDINGS: Lower chest: Cardiomegaly. Hepatobiliary: No discrete cystic or solid hepatic lesions. MRCP images demonstrate moderate intra and extrahepatic biliary ductal dilatation which abruptly terminates at or just beyond the junction of the common hepatic and cystic ducts in the proximal common bile duct. The common hepatic duct measures up to 14 mm in diameter. The area of obstruction on MRCP images is poorly demonstrated secondary to central loss of signal intensity on MRCP images. Unfortunately, this region is also poorly evaluated on postcontrast imaging secondary to patient motion. No discrete mass is identified in this region. Gallbladder is moderately distended. Several tiny filling defects are noted in the gallbladder, compatible with tiny gallstones. Gallbladder wall does not appear thickened and there is no pericholecystic fluid or surrounding inflammatory changes to suggest an acute cholecystitis at this time. Pancreas: No discrete pancreatic mass identified in the pancreatic head to account for the common bile duct obstruction. There are some tiny T1 hypointense, T2 hyperintense, nonenhancing lesions in the distal body and tail of the pancreas, measuring up to 1 cm in diameter, presumably tiny pancreatic pseudocysts. No pancreatic ductal dilatation noted on MRCP images. No peripancreatic inflammatory changes. Spleen:  Unremarkable. Adrenals/Urinary Tract: Well-defined T1 hypointense, T2 hyperintense, nonenhancing lesions are noted in both kidneys, the largest of which is exophytic extending from  the upper pole of the right kidney measuring up to 5.4 cm in diameter. No hydroureteronephrosis in the visualized portions of the abdomen. Bilateral adrenal glands are  normal in appearance. Stomach/Bowel: Visualized portions are unremarkable. Vascular/Lymphatic: Aortic atherosclerosis, without definite aneurysm in the abdominal vasculature. No lymphadenopathy noted in the abdomen. Other: No significant volume of ascites noted in the visualized portions of the peritoneal cavity. Musculoskeletal: No aggressive osseous lesions are noted in the visualized portions of the skeleton. IMPRESSION: 1. Abrupt cut off of the common bile duct at or immediately beyond the junction of the common hepatic duct and cystic duct with moderate proximal biliary dilatation. The exact source of obstruction is unclear on today's examination, with differential considerations including an obstructing ductal stone (which is simply not visualized secondary to technical limitations on today's examination), ductal adenocarcinoma, or obstructing pancreatic head lesion (which is not visualized on today's examination secondary to technical limitations). Further evaluation with ERCP is recommended if clinically appropriate. 2. Cholelithiasis without evidence of acute cholecystitis at this time. Electronically Signed   By: Vinnie Langton M.D.   On: 10/23/2016 14:37   Dg C-arm 1-60 Min-no Report  Result Date: 10/24/2016 Fluoroscopy was utilized by the requesting physician.  No radiographic interpretation.   Mr Abdomen Mrcp Moise Boring Contast  Result Date: 10/23/2016 CLINICAL DATA:  83 year old male with history of progressively worsening jaundice. No associated fever, abdominal pain, nausea, emesis, constipation, melena or hematochezia. Biliary tract ductal dilatation noted on recent CT examination. EXAM: MRI ABDOMEN WITHOUT AND WITH CONTRAST (INCLUDING MRCP) TECHNIQUE: Multiplanar multisequence MR imaging of the abdomen was performed both before  and after the administration of intravenous contrast. Heavily T2-weighted images of the biliary and pancreatic ducts were obtained, and three-dimensional MRCP images were rendered by post processing. CONTRAST:  28m MULTIHANCE GADOBENATE DIMEGLUMINE 529 MG/ML IV SOLN COMPARISON:  No priors.  CT of the abdomen and pelvis 10/22/2016. FINDINGS: Lower chest: Cardiomegaly. Hepatobiliary: No discrete cystic or solid hepatic lesions. MRCP images demonstrate moderate intra and extrahepatic biliary ductal dilatation which abruptly terminates at or just beyond the junction of the common hepatic and cystic ducts in the proximal common bile duct. The common hepatic duct measures up to 14 mm in diameter. The area of obstruction on MRCP images is poorly demonstrated secondary to central loss of signal intensity on MRCP images. Unfortunately, this region is also poorly evaluated on postcontrast imaging secondary to patient motion. No discrete mass is identified in this region. Gallbladder is moderately distended. Several tiny filling defects are noted in the gallbladder, compatible with tiny gallstones. Gallbladder wall does not appear thickened and there is no pericholecystic fluid or surrounding inflammatory changes to suggest an acute cholecystitis at this time. Pancreas: No discrete pancreatic mass identified in the pancreatic head to account for the common bile duct obstruction. There are some tiny T1 hypointense, T2 hyperintense, nonenhancing lesions in the distal body and tail of the pancreas, measuring up to 1 cm in diameter, presumably tiny pancreatic pseudocysts. No pancreatic ductal dilatation noted on MRCP images. No peripancreatic inflammatory changes. Spleen:  Unremarkable. Adrenals/Urinary Tract: Well-defined T1 hypointense, T2 hyperintense, nonenhancing lesions are noted in both kidneys, the largest of which is exophytic extending from the upper pole of the right kidney measuring up to 5.4 cm in diameter. No  hydroureteronephrosis in the visualized portions of the abdomen. Bilateral adrenal glands are normal in appearance. Stomach/Bowel: Visualized portions are unremarkable. Vascular/Lymphatic: Aortic atherosclerosis, without definite aneurysm in the abdominal vasculature. No lymphadenopathy noted in the abdomen. Other: No significant volume of ascites noted in the visualized portions of the peritoneal cavity. Musculoskeletal: No aggressive osseous lesions are noted in the visualized portions  of the skeleton. IMPRESSION: 1. Abrupt cut off of the common bile duct at or immediately beyond the junction of the common hepatic duct and cystic duct with moderate proximal biliary dilatation. The exact source of obstruction is unclear on today's examination, with differential considerations including an obstructing ductal stone (which is simply not visualized secondary to technical limitations on today's examination), ductal adenocarcinoma, or obstructing pancreatic head lesion (which is not visualized on today's examination secondary to technical limitations). Further evaluation with ERCP is recommended if clinically appropriate. 2. Cholelithiasis without evidence of acute cholecystitis at this time. Electronically Signed   By: Vinnie Langton M.D.   On: 10/23/2016 14:37      No results found for: HGBA1C Lab Results  Component Value Date   LDLCALC 39 12/29/2011   CREATININE 1.18 10/25/2016       Scheduled Meds: . ampicillin-sulbactam (UNASYN) IV  3 g Intravenous Q8H  . indomethacin  100 mg Rectal Once  . iopamidol      . lidocaine (PF)      . pantoprazole  40 mg Oral Q0600   Continuous Infusions:    LOS: 3 days    Time spent: >30 MINS    [facility]  [facility] Pager [phone]. If 7PM-7AM, please contact night-coverage at [email or web address], password [facility] 10/25/2016, 9:41 AM  LOS: 3 days

## 2016-10-25 NOTE — Procedures (Signed)
Obstructive jaundice, CBD obstruction, failed ERCP  S/p [place] AND FLUORO RT 10 FR INT EXT BILIARY DRAIN  No comp Stable Keep to gravity bag Full report in PACS

## 2016-10-25 NOTE — Progress Notes (Signed)
[facility] Progress Note  Subjective:  Feels good.  Eating lunch.  Biliary drain with good output.  Objective:  Vital signs in last 24 hours: Temp:  [97.5 F (36.4 C)-98.7 F (37.1 C)] 97.7 F (36.5 C) (02/24 1056) Pulse Rate:  [50-76] 56 (02/24 1056) Resp:  [12-27] 18 (02/24 1056) BP: (104-133)/(52-71) 118/63 (02/24 1056) SpO2:  [92 %-100 %] 99 % (02/24 1056) Weight:  [199 lb 9.6 oz (90.5 kg)] 199 lb 9.6 oz (90.5 kg) (02/23 2207) Last BM Date: 10/23/16 General:  Alert,  Well-developed, in NAD; jaundice noted Heart:  Regular rate and rhythm; no murmurs Pulm:  CTAB.  No W/R/R.  No increased WOB. Abdomen:  Soft, non-distended.  BS present.  Mild TTP on the right.   Extremities:  Without edema. Neurologic:  Alert and oriented x 4;  grossly normal neurologically. Psych:  Alert and cooperative. Normal mood and affect.  Intake/Output from previous day: 02/23 0701 - 02/24 0700 In: 2100 [P.O.:600; I.V.:1500] Out: 555 [Urine:550; Blood:5] Intake/Output this shift: Total I/O In: 0  Out: 150 [Urine:150]  Lab Results:  Recent Labs  10/23/16 0607 10/24/16 0731 10/25/16 0520  WBC 6.6 6.9 7.1  HGB 10.4* 9.7* 10.1*  HCT 31.9* 29.1* 30.3*  PLT 121* 104* 63*   BMET  Recent Labs  10/23/16 0607 10/24/16 0731 10/25/16 0520  NA 136 137 139  K 4.5 4.0 4.2  CL 102 108 107  CO2 '26 22 23  '$ GLUCOSE 96 98 172*  BUN 18 22* 27*  CREATININE 1.28* 1.22 1.18  CALCIUM 8.5* 7.8* 8.3*   LFT  Recent Labs  10/23/16 0733  10/25/16 0520  PROT  --   < > 5.0*  ALBUMIN  --   < > 2.0*  AST  --   < > 86*  ALT  --   < > 94*  ALKPHOS  --   < > 323*  BILITOT  --   < > 14.9*  BILIDIR 6.5*  --   --   < > = values in this interval not displayed. PT/INR  Recent Labs  10/24/16 0731 10/25/16 0520  LABPROT 15.7* 13.3  INR 1.25 1.01   Dg C-arm 1-60 Min-no Report  Result Date: 10/24/2016 Fluoroscopy was utilized by the requesting physician.  No radiographic  interpretation.   Ir Int Lianne Cure Biliary Drain With Cholangiogram  Result Date: 10/25/2016 INDICATION: Obstructive jaundice, distal common bile duct obstruction concerning for malignant obstruction. Failed ERCP. EXAM: IR INT-EXT BILIARY DRAIN W/ CHOLANGIOGRAM MEDICATIONS: Unasyn 3 g; The antibiotic was administered within an appropriate time frame prior to the initiation of the procedure. ANESTHESIA/SEDATION: Moderate (conscious) sedation was employed during this procedure. A total of Versed 1.0 mg and Fentanyl 37.5 mcg was administered intravenously. Moderate Sedation Time: 20 minutes. The patient's level of consciousness and vital signs were monitored continuously by radiology nursing throughout the procedure under my direct supervision. FLUOROSCOPY TIME:  Fluoroscopy Time: 2 minutes 30 seconds (11 mGy). COMPLICATIONS: None immediate. PROCEDURE: Informed written consent was obtained from the patient after a thorough discussion of the procedural risks, benefits and alternatives. All questions were addressed. Maximal Sterile Barrier Technique was utilized including caps, mask, sterile gowns, sterile gloves, sterile drape, hand hygiene and skin antiseptic. A timeout was performed prior to the initiation of the procedure. Previous imaging reviewed. Preliminary ultrasound performed. Dilated peripheral right hepatic ducts demonstrated. Overlying skin marked. Under sterile conditions and local anesthesia, ultrasound percutaneous needle access performed of a peripheral right hepatic  duct. There was return of bile. Contrast injection performed for initial cholangiogram. Diffuse intrahepatic biliary dilatation evident. 018 guidewire advanced centrally followed by the Accustick dilator set. Contrast injection demonstrates diffuse biliary dilatation with a severe stricture/ obstruction of the mid CBD. Below the stricture, the bile duct is nondilated and drains into the duodenum. Catheter and guidewire access were manipulated  through the common bile duct severe stricture. Catheter was advanced into the duodenum. Contrast injection confirms position in the duodenum. Amplatz guidewire exchange performed. 27 [place] internal external biliary drain and successfully advanced over the guidewire. Retention loop formed in the duodenum. Contrast injection confirms adequate drainage of the biliary tree. Filling defects within the biliary tree at the conclusion the procedure compatible with blood related to the intervention but no active bleeding appreciated. Catheter secured with a Prolene suture and connected to external gravity drainage bag. Sterile dressing applied. Patient tolerated the procedure well. No immediate complication. IMPRESSION: Successful ultrasound and fluoroscopic 10 French right internal external biliary drain to relieve the biliary obstruction secondary to the mid CBD severe stricture / obstruction. Again, this is concerning for a malignant process. Electronically Signed   By: Jerilynn Mages.  Shick M.D.   On: 10/25/2016 11:24   Assessment / Plan: *  Obstructive jaundice with ? cholangiocarcinoma vs pancreatic cancer.  Severe biliary obstruction was found at attempted ERCP on 2/23.  Had internal external PTC drain placed by IR this AM.  CA 19-9 significantly elevated at 1581.  Doesn't look like brushings were obtained.  *  Coagulopathy.  Chronic Coumadin for A fib.   *  Thrombocytopenia:  Platelets down some today.  *  History Wegener's lymphoma  PLAN: *  Unasyn started for empiric coverage of development of possible cholangitis.   *  Trend LFT's. *  May need EUS as outpatient.   LOS: 3 days   ZEHR, JESSICA D.  10/25/2016, 1:04 PM  Pager number [phone]  I have discussed the case with the PA, and that is the plan I formulated. I personally interviewed and examined the patient.  The assistance of interventional radiology is much appreciated in this case. An internal/external drain was placed today, no early area  brushings or other samples were obtained. CA 1 9-9 is significantly elevated as noted above. This, along with the pancreatic mass on imaging and overall clinical scenario indicates very likely pancreatic cancer or cholangiocarcinoma.  Of note, culture of his biliary fluid from the procedure earlier today showing WBCs and rare gram-negative rods.  As before, I feel that he will need an endoscopic ultrasound and biopsy in the near future for a final tissue diagnosis. A rendezvous ERCP procedure could also potentially be done at that time, but I think it would likely be easier and less invasive for IR to change his existing device to a metal stent for probable palliation.  I discussed this with Mr. Samara Snide and his family, they all understand and are in agreement. I suspect he'll be her another couple of days as he may show that his biliary drain is functioning well and there are no signs of infection. I think about a week of total antibiotic course would be appropriate.    I spent a total of 25  minutes with the patient reviewing hospital notes, imaging reports, pathology (if applicable),  labs and examining the patient as well as establishing an assessment and plan that was discussed with the patient.  > 50% of time was spent in direct patient care.  Nelida Meuse III Pager [phone]  Mon-Fri 8a-5p [phone] after 5p, weekends, holidays

## 2016-10-25 NOTE — Progress Notes (Signed)
Thank you :)

## 2016-10-26 LAB — COMPREHENSIVE METABOLIC PANEL
ALBUMIN: 2.1 g/dL — AB (ref 3.5–5.0)
ALT: 81 U/L — ABNORMAL HIGH (ref 17–63)
ANION GAP: 4 — AB (ref 5–15)
AST: 56 U/L — ABNORMAL HIGH (ref 15–41)
Alkaline Phosphatase: 282 U/L — ABNORMAL HIGH (ref 38–126)
BUN: 27 mg/dL — ABNORMAL HIGH (ref 6–20)
CO2: 24 mmol/L (ref 22–32)
Calcium: 8.2 mg/dL — ABNORMAL LOW (ref 8.9–10.3)
Chloride: 113 mmol/L — ABNORMAL HIGH (ref 101–111)
Creatinine, Ser: 1.26 mg/dL — ABNORMAL HIGH (ref 0.61–1.24)
GFR, EST AFRICAN AMERICAN: 60 mL/min — AB (ref >60)
GFR, EST NON AFRICAN AMERICAN: 51 mL/min — AB (ref >60)
Glucose, Bld: 96 mg/dL (ref 65–99)
POTASSIUM: 3.9 mmol/L (ref 3.5–5.1)
Sodium: 141 mmol/L (ref 135–145)
TOTAL PROTEIN: 5.1 g/dL — AB (ref 6.5–8.1)
Total Bilirubin: 8 mg/dL — ABNORMAL HIGH (ref 0.3–1.2)

## 2016-10-26 LAB — PROTIME-INR
INR: 0.96
PROTHROMBIN TIME: 12.8 s (ref 11.4–15.2)

## 2016-10-26 LAB — MAGNESIUM: MAGNESIUM: 1.9 mg/dL (ref 1.7–2.4)

## 2016-10-26 MED ORDER — DEXTROSE 5 % IV SOLN
2.0000 g | INTRAVENOUS | Status: AC
  Administered 2016-10-26 – 2016-11-01 (×7): 2 g via INTRAVENOUS
  Filled 2016-10-26 (×7): qty 2

## 2016-10-26 MED ORDER — DM-GUAIFENESIN ER 30-600 MG PO TB12
1.0000 | ORAL_TABLET | Freq: Two times a day (BID) | ORAL | Status: DC | PRN
  Filled 2016-10-26: qty 1

## 2016-10-26 MED ORDER — IPRATROPIUM-ALBUTEROL 0.5-2.5 (3) MG/3ML IN SOLN
3.0000 mL | Freq: Four times a day (QID) | RESPIRATORY_TRACT | Status: DC
  Filled 2016-10-26: qty 3

## 2016-10-26 MED ORDER — ALBUTEROL SULFATE (2.5 MG/3ML) 0.083% IN NEBU: 2.5000 mg | INHALATION_SOLUTION | RESPIRATORY_TRACT | Status: DC | PRN

## 2016-10-26 MED ORDER — POLYETHYLENE GLYCOL 3350 17 G PO PACK
17.0000 g | PACK | Freq: Every day | ORAL | Status: DC
  Administered 2016-10-26 – 2016-11-01 (×7): 17 g via ORAL
  Filled 2016-10-26 (×7): qty 1

## 2016-10-26 NOTE — Progress Notes (Signed)
Pharmacy Antibiotic Note  Jimmy Myers is a 83 y.o. male admitted on 10/22/2016 with cholangitis.  Pharmacy has been consulted for cefepime dosing. 83 yo who is here for complication of cholangiocarcinoma vs pancreatic CA. He was started on Unasyn for empiric coverage. His biliary cx has grown out enterobacter which will not be covered by unasyn. D/w case with Dr. Loletha Carrow, we will change abx to cefepime x7 days until sens are back.   Plan: Cefepime 2g IV q24 x 7 days Rx sign off  Height: 5\' 10"  (177.8 cm) Weight: 201 lb 8 oz (91.4 kg) IBW/kg (Calculated) : 73  Temp (24hrs), Avg:97.8 F (36.6 C), Min:97.7 F (36.5 C), Max:98.1 F (36.7 C)   Recent Labs Lab 10/21/16 1010 10/22/16 1232 10/23/16 0607 10/24/16 0731 10/25/16 0520 10/26/16 0703  WBC 6.0 7.8 6.6 6.9 7.1  --   CREATININE  --  1.15 1.28* 1.22 1.18 1.26*    Estimated Creatinine Clearance: 51.4 mL/min (by C-G formula based on SCr of 1.26 mg/dL (H)).    Allergies  Allergen Reactions  . Codeine Other (See Comments)    nervousness  . Simvastatin Other (See Comments)    Muscle pain/hurting  . Vicodin [Hydrocodone-Acetaminophen] Other (See Comments)    nervousness    Antimicrobials this admission: 2/23 unasyn >> 2/25 2/25 cefepime >> x7days  Dose adjustments this admission:   Microbiology results: 2/24 bile: enterobacter [place]  Onnie Boer, PharmD, BCPS, AAHIVP, CPP Infectious Disease Pharmacist Pager: [phone] 10/26/2016 12:15 PM

## 2016-10-26 NOTE — Progress Notes (Signed)
PROGRESS NOTE    JOHNERIC GOLEY  [id number] DOB: [birth year] DOA: 10/22/2016 PCP: Kenn File, MD   Brief Narrative: 83 y.o.malewith medical history significant of chronic atrial fibrillation, anxiety, aortic root enlargement, osteoarthritis, history of atrial thrombus, lymphoma, chronic systolic congestive heart failure, diverticulosis, GERD, hemorrhoids, hypertension, history of pneumonia, recently treated for influenza who is being referred to the emergency department by his primary care provider due to progressively worsening jaundice. CT scan abdomen/pelvis showed moderate intrahepatic biliary ductal dilation, mild distention of the gallbladder, distention of the CBD suspicious for CBD or pancreatic carcinoma. ERCP unsuccessful. External biliary drainage was placed by Ir.  Assessment & Plan:  #  Obstructive jaundice worrisome for cholangiocarcinoma versus pancreatic cancer: ? Acute cholangitis: -Status post external drain was placed by interventional radiologist. CA 19-9 significantly elevated. This along with pancreatic mass on imaging studies indicates very likely pancreatic cancer or cholangiocarcinoma. -However the cultures of biliary fluid from the procedure growing gram-negative rods, abundant Enterobacter species. The antibiotics switched to cefepime pending sensitivity culture results for presumed cholangitis. -Further plan as per gastroenterologist, may need tissue biopsy. This is likely after completion of antibiotics course for -Continue supportive care including Zofran as needed, Protonix orally  #Chronic systolic CHF: Stable now. Echo in May 2017 showed EF of 40-45%.  #Atrial fibrillation, exact type unknown: Cardiology was consulted by my colleague yesterday to evaluated for the long-term anticoagulation. Patient had supratherapeutic INR on admission likely due to liver disease. Currently INR is normal. Coumadin on hold.  #Hypertension: Blood pressure acceptable.  Lisinopril on hold now. Continue to monitor.  #History of acid reflux: On Protonix  #Thrombocytopenia: Monitor platelet count. No sign of bleeding.  Principal Problem:   Jaundice Active Problems:   Chronic systolic heart failure (HCC)   Atrial fibrillation   HTN (hypertension), benign   Supratherapeutic INR   GERD (gastroesophageal reflux disease)   Anxiety   Common bile duct stricture  DVT prophylaxis: SCD. No anticoagulation because of thrombocytopenia Code Status: Full code Family Communication: Patient's wife at bedside Disposition Plan: Likely discharge home in 1-2 days depending on GIs plan    Consultants:   GI  Cardiologist  IR  Procedures: Failed ERCP, external drain placement by IR Antimicrobials: Unasyn from 2/23-2/24. Cefepime from 2/24   Subjective: Patient was seen and examined at bedside. He is reporting doing well. Has weakness. Denied headache, dizziness, nausea vomiting chest pain or shortness of breath.  Objective: Vitals:   10/25/16 1056 10/25/16 2049 10/26/16 0453 10/26/16 1041  BP: 118/63 (!) 114/58 116/62 119/65  Pulse: (!) 56 73 72 67  Resp: 18 18 18 18   Temp: 97.7 F (36.5 C) 97.7 F (36.5 C) 97.7 F (36.5 C) 98.1 F (36.7 C)  TempSrc: Oral Oral Oral Oral  SpO2: 99% 96% 96% 98%  Weight:  91.4 kg (201 lb 8 oz)    Height:        Intake/Output Summary (Last 24 hours) at 10/26/16 1620 Last data filed at 10/26/16 1400  Gross per 24 hour  Intake             1045 ml  Output             2225 ml  Net            -1180 ml   Filed Weights   10/23/16 2039 10/24/16 2207 10/25/16 2049  Weight: 91.7 kg (202 lb 2.6 oz) 90.5 kg (199 lb 9.6 oz) 91.4 kg (201 lb 8 oz)  Examination:  General exam: Appears calm and comfortable  Eye: + Icterus Respiratory system: Clear to auscultation. Respiratory effort normal.  Cardiovascular system: S1 & S2 heard, RRR.  No pedal edema. Gastrointestinal system: Abdomen is nondistended, soft and  nontender. Normal bowel sounds heard. Central nervous system: Alert and oriented. No focal neurological deficits. Extremities: Symmetric 5 x 5 power. Skin: Generalized icteric skin Psychiatry: Judgement and insight appear normal. Mood & affect appropriate.     Data Reviewed: I have personally reviewed following labs and imaging studies  CBC:  Recent Labs Lab 10/21/16 1010 10/22/16 1232 10/23/16 0607 10/24/16 0731 10/25/16 0520  WBC 6.0 7.8 6.6 6.9 7.1  NEUTROABS 4.0 5.2 3.9 4.7 6.0  HGB  --   --  10.4* 9.7* 10.1*  HCT 35.9* 33.8* 31.9* 29.1* 30.3*  MCV 97 91 96.1 93.9 94.4  PLT 166 146* 121* 104* 63*   Basic Metabolic Panel:  Recent Labs Lab 10/22/16 1232 10/23/16 0607 10/24/16 0731 10/25/16 0520 10/26/16 0703  NA 130* 136 137 139 141  K 5.1 4.5 4.0 4.2 3.9  CL 100 102 108 107 113*  CO2 25 26 22 23 24   GLUCOSE 95 96 98 172* 96  BUN 21 18 22* 27* 27*  CREATININE 1.15 1.28* 1.22 1.18 1.26*  CALCIUM 8.6 8.5* 7.8* 8.3* 8.2*   GFR: Estimated Creatinine Clearance: 51.4 mL/min (by C-G formula based on SCr of 1.26 mg/dL (H)). Liver Function Tests:  Recent Labs Lab 10/22/16 1232 10/23/16 0607 10/24/16 0731 10/25/16 0520 10/26/16 0703  AST 113* 101* 91* 86* 56*  ALT 144* 116* 99* 94* 81*  ALKPHOS 562* 387* 346* 323* 282*  BILITOT 8.5* 10.4* 13.1* 14.9* 8.0*  PROT 5.3* 5.3* 4.7* 5.0* 5.1*  ALBUMIN 3.4* 2.3* 1.9* 2.0* 2.1*   No results for input(s): LIPASE, AMYLASE in the last 168 hours. No results for input(s): AMMONIA in the last 168 hours. Coagulation Profile:  Recent Labs Lab 10/22/16 2105 10/23/16 0607 10/24/16 0731 10/25/16 0520 10/26/16 0703  INR 5.20* 4.62* 1.25 1.01 0.96   Cardiac Enzymes: No results for input(s): CKTOTAL, CKMB, CKMBINDEX, TROPONINI in the last 168 hours. BNP (last 3 results) No results for input(s): PROBNP in the last 8760 hours. HbA1C: No results for input(s): HGBA1C in the last 72 hours. CBG: No results for input(s):  GLUCAP in the last 168 hours. Lipid Profile: No results for input(s): CHOL, HDL, LDLCALC, TRIG, CHOLHDL, LDLDIRECT in the last 72 hours. Thyroid Function Tests: No results for input(s): TSH, T4TOTAL, FREET4, T3FREE, THYROIDAB in the last 72 hours. Anemia Panel: No results for input(s): VITAMINB12, FOLATE, FERRITIN, TIBC, IRON, RETICCTPCT in the last 72 hours. Sepsis Labs: No results for input(s): PROCALCITON, LATICACIDVEN in the last 168 hours.  Recent Results (from the past 240 hour(s))  Aerobic/Anaerobic Culture (surgical/deep wound)     Status: None (Preliminary result)   Collection Time: 10/25/16 10:49 AM  Result Value Ref Range Status   Specimen Description BILE  Final   Special Requests NONE  Final   Gram Stain   Final    FEW WBC PRESENT, PREDOMINANTLY PMN RARE GRAM NEGATIVE RODS    Culture   Final    ABUNDANT ENTEROBACTER SPECIES SUSCEPTIBILITIES TO FOLLOW    Report Status PENDING  Incomplete         Radiology Studies: Ir Int Lianne Cure Biliary Drain With Cholangiogram  Result Date: 10/25/2016 INDICATION: Obstructive jaundice, distal common bile duct obstruction concerning for malignant obstruction. Failed ERCP. EXAM: IR INT-EXT BILIARY DRAIN W/  CHOLANGIOGRAM MEDICATIONS: Unasyn 3 g; The antibiotic was administered within an appropriate time frame prior to the initiation of the procedure. ANESTHESIA/SEDATION: Moderate (conscious) sedation was employed during this procedure. A total of Versed 1.0 mg and Fentanyl 37.5 mcg was administered intravenously. Moderate Sedation Time: 20 minutes. The patient's level of consciousness and vital signs were monitored continuously by radiology nursing throughout the procedure under my direct supervision. FLUOROSCOPY TIME:  Fluoroscopy Time: 2 minutes 30 seconds (11 mGy). COMPLICATIONS: None immediate. PROCEDURE: Informed written consent was obtained from the patient after a thorough discussion of the procedural risks, benefits and alternatives.  All questions were addressed. Maximal Sterile Barrier Technique was utilized including caps, mask, sterile gowns, sterile gloves, sterile drape, hand hygiene and skin antiseptic. A timeout was performed prior to the initiation of the procedure. Previous imaging reviewed. Preliminary ultrasound performed. Dilated peripheral right hepatic ducts demonstrated. Overlying skin marked. Under sterile conditions and local anesthesia, ultrasound percutaneous needle access performed of a peripheral right hepatic duct. There was return of bile. Contrast injection performed for initial cholangiogram. Diffuse intrahepatic biliary dilatation evident. 018 guidewire advanced centrally followed by the Accustick dilator set. Contrast injection demonstrates diffuse biliary dilatation with a severe stricture/ obstruction of the mid CBD. Below the stricture, the bile duct is nondilated and drains into the duodenum. Catheter and guidewire access were manipulated through the common bile duct severe stricture. Catheter was advanced into the duodenum. Contrast injection confirms position in the duodenum. Amplatz guidewire exchange performed. 70 [place] internal external biliary drain and successfully advanced over the guidewire. Retention loop formed in the duodenum. Contrast injection confirms adequate drainage of the biliary tree. Filling defects within the biliary tree at the conclusion the procedure compatible with blood related to the intervention but no active bleeding appreciated. Catheter secured with a Prolene suture and connected to external gravity drainage bag. Sterile dressing applied. Patient tolerated the procedure well. No immediate complication. IMPRESSION: Successful ultrasound and fluoroscopic 10 French right internal external biliary drain to relieve the biliary obstruction secondary to the mid CBD severe stricture / obstruction. Again, this is concerning for a malignant process. Electronically Signed   By: Jerilynn Mages.  Shick M.D.    On: 10/25/2016 11:24        Scheduled Meds: . ceFEPime (MAXIPIME) IV  2 g Intravenous Q24H  . indomethacin  100 mg Rectal Once  . pantoprazole  40 mg Oral Q0600   Continuous Infusions:   LOS: 4 days    Ikenna Ohms Tanna Furry, MD [facility] Pager [phone]  If 7PM-7AM, please contact night-coverage [email or web address] Password [email or web address] 10/26/2016, 4:20 PM

## 2016-10-26 NOTE — Evaluation (Signed)
Occupational Therapy Evaluation Patient Details Name: Jimmy Myers MRN: [id number] DOB: March 02, 1932 Today's Date: 10/26/2016    History of Present Illness Pt is an 83 y/o male admitted from home secondary to worsening jaundice (?carcinoma). ERCP performed on 10/24/16 with another endoscopy scheduled for 2/24. PMH including but not limited to a-fib, CHF, HTN and lymphoma.    Clinical Impression   Pt reports he was independent with ADL PTA. Currently pt overall min assist for ADL and functional mobility. Pt presenting with generalized weakness, poor standing balance, decreased activity tolerance, and pain impacting his independence and safety with ADL and functional mobility. Pt planning to d/c home with 24/7 supervision from his wife. Recommending HHOT for follow up to maximize independence and safety with ADL and functional mobility upon return home. Pt would benefit from continued skilled OT to address established goals.     Follow Up Recommendations  Home health OT;Supervision/Assistance - 24 hour    Equipment Recommendations  3 in 1 bedside commode    Recommendations for Other Services       Precautions / Restrictions Precautions Precautions: Fall Restrictions Weight Bearing Restrictions: No      Mobility Bed Mobility Overal bed mobility: Needs Assistance Bed Mobility: Supine to Sit     Supine to sit: Min guard;HOB elevated     General bed mobility comments: Incrased time, min guard for safety. HOB elevated with use of bed rails  Transfers Overall transfer level: Needs assistance Equipment used: None Transfers: Sit to/from Stand Sit to Stand: Min guard         General transfer comment: Min assist for balance once in standing. Min guard for sit to stand from EOB. Increased time.    Balance Overall balance assessment: Needs assistance Sitting-balance support: Feet supported;No upper extremity supported Sitting balance-Leahy Scale: Good     Standing balance  support: Single extremity supported Standing balance-Leahy Scale: Poor Standing balance comment: Min assist for standing balance                            ADL Overall ADL's : Needs assistance/impaired Eating/Feeding: Set up;Sitting   Grooming: Min guard;Standing   Upper Body Bathing: Set up;Supervision/ safety;Sitting   Lower Body Bathing: Minimal assistance;Sit to/from stand   Upper Body Dressing : Set up;Supervision/safety;Sitting Upper Body Dressing Details (indicate cue type and reason): to don/doff hospital gown Lower Body Dressing: Minimal assistance;Sit to/from stand Lower Body Dressing Details (indicate cue type and reason): for standing balance, able to adjust socks sitting EOB Toilet Transfer: Minimal assistance;Ambulation (hand held assist) Toilet Transfer Details (indicate cue type and reason): Simulated by sit to stand from EOB with functional mobility in room         Functional mobility during ADLs: Minimal assistance (hand held assist)       Vision         Perception     Praxis      Pertinent Vitals/Pain Pain Assessment: Faces Faces Pain Scale: Hurts even more Pain Location: R side of abdomen Pain Descriptors / Indicators: Aching;Guarding;Sore Pain Intervention(s): Monitored during session;Patient requesting pain meds-RN notified;Repositioned     Hand Dominance Right   Extremity/Trunk Assessment Upper Extremity Assessment Upper Extremity Assessment: Overall WFL for tasks assessed   Lower Extremity Assessment Lower Extremity Assessment: Defer to PT evaluation       Communication Communication Communication: No difficulties   Cognition Arousal/Alertness: Awake/alert Behavior During Therapy: WFL for tasks assessed/performed Overall Cognitive Status: Within  Functional Limits for tasks assessed                     General Comments       Exercises       Shoulder Instructions      Home Living Family/patient expects  to be discharged to:: Private residence Living Arrangements: Spouse/significant other Available Help at Discharge: Family;Available 24 hours/day Type of Home: House Home Access: Ramped entrance     Home Layout: One level     Bathroom Shower/Tub: Tub/shower unit Shower/tub characteristics: Door Bathroom Toilet: Standard     Home Equipment: Environmental consultant - 4 wheels;Cane - single point          Prior Functioning/Environment Level of Independence: Independent with assistive device(s)        Comments: pt reported that he uses a SPC when ambulating within his community        OT Problem List: Decreased strength;Decreased activity tolerance;Impaired balance (sitting and/or standing);Decreased knowledge of use of DME or AE;Decreased knowledge of precautions;Pain      OT Treatment/Interventions: Self-care/ADL training;Energy conservation;DME and/or AE instruction;Therapeutic activities;Patient/family education;Balance training    OT Goals(Current goals can be found in the care plan section) Acute Rehab OT Goals Patient Stated Goal: return home OT Goal Formulation: With patient Time For Goal Achievement: 11/09/16 Potential to Achieve Goals: Good ADL Goals Pt Will Perform Grooming: with modified independence;standing Pt Will Perform Upper Body Bathing: with modified independence;sitting Pt Will Perform Lower Body Bathing: with modified independence;sit to/from stand Pt Will Transfer to Toilet: with modified independence;ambulating;bedside commode (over toilet) Pt Will Perform Toileting - Clothing Manipulation and hygiene: with modified independence;sit to/from stand Pt Will Perform Tub/Shower Transfer: Tub transfer;with supervision;ambulating;3 in 1;rolling walker  OT Frequency: Min 2X/week   Barriers to D/C:            Co-evaluation              End of Session Equipment Utilized During Treatment: Gait belt Nurse Communication: Mobility status;Patient requests pain  meds  Activity Tolerance: Patient tolerated treatment well Patient left: in chair;with call bell/phone within reach;with chair alarm set  OT Visit Diagnosis: Unsteadiness on feet (R26.81);Muscle weakness (generalized) (M62.81)                ADL either performed or assessed with clinical judgement  Time: [id number] OT Time Calculation (min): 17 min Charges:  OT General Charges $OT Visit: 1 Procedure OT Evaluation $OT Eval Moderate Complexity: 1 Procedure G-Codes:     Delmar Arriaga A. Ulice Brilliant, M.S., OTR/L Pager: [place] 10/26/2016, 5:10 PM

## 2016-10-27 ENCOUNTER — Ambulatory Visit (HOSPITAL_COMMUNITY)
Admission: RE | Admit: 2016-10-27 | Payer: No Typology Code available for payment source | Source: Ambulatory Visit | Admitting: Ophthalmology

## 2016-10-27 ENCOUNTER — Encounter (HOSPITAL_COMMUNITY): Admission: RE | Payer: Self-pay | Source: Ambulatory Visit

## 2016-10-27 ENCOUNTER — Encounter (HOSPITAL_COMMUNITY): Payer: Self-pay | Admitting: Gastroenterology

## 2016-10-27 DIAGNOSIS — K838 Other specified diseases of biliary tract: Secondary | ICD-10-CM

## 2016-10-27 DIAGNOSIS — K831 Obstruction of bile duct: Secondary | ICD-10-CM

## 2016-10-27 LAB — COMPREHENSIVE METABOLIC PANEL
ALBUMIN: 2.3 g/dL — AB (ref 3.5–5.0)
ALK PHOS: 272 U/L — AB (ref 38–126)
ALT: 78 U/L — ABNORMAL HIGH (ref 17–63)
ANION GAP: 7 (ref 5–15)
AST: 58 U/L — ABNORMAL HIGH (ref 15–41)
BILIRUBIN TOTAL: 9.4 mg/dL — AB (ref 0.3–1.2)
BUN: 24 mg/dL — ABNORMAL HIGH (ref 6–20)
CALCIUM: 8.5 mg/dL — AB (ref 8.9–10.3)
CO2: 26 mmol/L (ref 22–32)
Chloride: 108 mmol/L (ref 101–111)
Creatinine, Ser: 1.12 mg/dL (ref 0.61–1.24)
GFR calc Af Amer: >60 mL/min (ref >60)
GFR, EST NON AFRICAN AMERICAN: 59 mL/min — AB (ref >60)
GLUCOSE: 128 mg/dL — AB (ref 65–99)
POTASSIUM: 3.7 mmol/L (ref 3.5–5.1)
Sodium: 141 mmol/L (ref 135–145)
TOTAL PROTEIN: 5.6 g/dL — AB (ref 6.5–8.1)

## 2016-10-27 LAB — CBC
HCT: 33.2 % — ABNORMAL LOW (ref 39.0–52.0)
HEMOGLOBIN: 10.8 g/dL — AB (ref 13.0–17.0)
MCH: 31.6 pg (ref 26.0–34.0)
MCHC: 32.5 g/dL (ref 30.0–36.0)
MCV: 97.1 fL (ref 78.0–100.0)
Platelets: 106 10*3/uL — ABNORMAL LOW (ref 150–400)
RBC: 3.42 MIL/uL — ABNORMAL LOW (ref 4.22–5.81)
RDW: 16 % — AB (ref 11.5–15.5)
WBC: 7.8 10*3/uL (ref 4.0–10.5)

## 2016-10-27 SURGERY — PHACOEMULSIFICATION, CATARACT, WITH IOL INSERTION
Anesthesia: Monitor Anesthesia Care | Site: Eye | Laterality: Right

## 2016-10-27 MED ORDER — DM-GUAIFENESIN ER 30-600 MG PO TB12
1.0000 | ORAL_TABLET | Freq: Two times a day (BID) | ORAL | Status: DC
  Administered 2016-10-27 – 2016-11-01 (×11): 1 via ORAL
  Filled 2016-10-27 (×10): qty 1

## 2016-10-27 MED ORDER — OXYCODONE HCL 5 MG PO TABS
5.0000 mg | ORAL_TABLET | ORAL | Status: DC | PRN
  Administered 2016-10-27 – 2016-10-29 (×4): 5 mg via ORAL
  Filled 2016-10-27 (×5): qty 1

## 2016-10-27 NOTE — Progress Notes (Signed)
Physical Therapy Treatment Patient Details Name: Jimmy Myers MRN: [id number] DOB: [birth year] Today's Date: 10/27/2016    History of Present Illness Pt is an 83 y/o male admitted from home secondary to worsening jaundice (?carcinoma). ERCP performed on 10/24/16 with another endoscopy scheduled for 2/24. PMH including but not limited to a-fib, CHF, HTN and lymphoma.     PT Comments    Pt is improved with gait stability, but reinforced with pt and family that at this point in time, pt needs AD for safety.    Follow Up Recommendations  Home health PT;Supervision/Assistance - 24 hour     Equipment Recommendations  None recommended by PT;Other (comment)    Recommendations for Other Services       Precautions / Restrictions Precautions Precautions: Fall Restrictions Weight Bearing Restrictions: No    Mobility  Bed Mobility Overal bed mobility: Needs Assistance Bed Mobility: Supine to Sit;Sit to Supine     Supine to sit: Min assist;HOB elevated Sit to supine: Min guard   General bed mobility comments: increased time, min assist to boost up onto R elbow due to increased pain, o/w min guard, moderate use fo rails and HOB mildly raised.  Transfers Overall transfer level: Needs assistance Equipment used: None;Straight cane Transfers: Sit to/from Stand Sit to Stand: Min guard         General transfer comment: pt used support of the bed on the back of his legs for balance.  Ambulation/Gait Ambulation/Gait assistance: Min guard Ambulation Distance (Feet): 160 Feet Assistive device: Straight cane Gait Pattern/deviations: Step-through pattern;Decreased step length - right;Decreased stance time - left;Decreased stride length Gait velocity: decreased Gait velocity interpretation: Below normal speed for age/gender General Gait Details: pt mildly unsteady overall with cane, but no LOB or overt deviation.  Pt does not scan his environment, no attempt to change speed on  cue.   Stairs            Wheelchair Mobility    Modified Rankin (Stroke Patients Only)       Balance Overall balance assessment: Needs assistance Sitting-balance support: Feet supported;No upper extremity supported Sitting balance-Leahy Scale: Good       Standing balance-Leahy Scale: Poor Standing balance comment: safest using assistive device.  In this case, the cane.                    Cognition Arousal/Alertness: Awake/alert Behavior During Therapy: WFL for tasks assessed/performed Overall Cognitive Status: Within Functional Limits for tasks assessed                      Exercises      General Comments        Pertinent Vitals/Pain Pain Assessment: Faces Faces Pain Scale: Hurts even more Pain Location: R side of abdomen Pain Descriptors / Indicators: Aching;Guarding;Sore Pain Intervention(s): Monitored during session;Premedicated before session    Home Living                      Prior Function            PT Goals (current goals can now be found in the care plan section) Acute Rehab PT Goals Patient Stated Goal: return home PT Goal Formulation: With patient/family Time For Goal Achievement: 11/08/16 Potential to Achieve Goals: Good Progress towards PT goals: Progressing toward goals    Frequency    Min 3X/week      PT Plan Current plan remains appropriate    Co-evaluation  End of Session   Activity Tolerance: Patient tolerated treatment well Patient left: in bed;with call bell/phone within reach Nurse Communication: Mobility status PT Visit Diagnosis: Unsteadiness on feet (R26.81);Other abnormalities of gait and mobility (R26.89)     Time: [id number] PT Time Calculation (min) (ACUTE ONLY): 20 min  Charges:  $Gait Training: 8-22 mins                    G CodesTessie Fass Rheba Diamond 10/27/2016, 11:50 AM 10/27/2016  Donnella Sham, PT [phone]  (pager)

## 2016-10-27 NOTE — Care Management Important Message (Signed)
Important Message  Patient Details  Name: Jimmy Myers MRN: [id number] Date of Birth: May 21, 1932   Medicare Important Message Given:  Yes    [name] 10/27/2016, 3:42 PM

## 2016-10-27 NOTE — Anesthesia Postprocedure Evaluation (Signed)
Anesthesia Post Note  Patient: Jimmy Myers  Procedure(s) Performed: Procedure(s) (LRB): ENDOSCOPIC RETROGRADE CHOLANGIOPANCREATOGRAPHY (ERCP) WITH PROPOFOL (N/A)  Patient location during evaluation: PACU Anesthesia Type: General Level of consciousness: awake Pain management: pain level controlled Vital Signs Assessment: post-procedure vital signs reviewed and stable Cardiovascular status: stable Anesthetic complications: no       Last Vitals:  Vitals:   10/27/16 0520 10/27/16 0859  BP: 102/60 128/64  Pulse: 62 72  Resp: 16 18  Temp: 37.1 C 36.4 C    Last Pain:  Vitals:   10/27/16 1133  TempSrc:   PainSc: 2                  Nadezhda Pollitt

## 2016-10-27 NOTE — Progress Notes (Signed)
PROGRESS NOTE    PHILIP TISBY  [id number] DOB: 08/14/1932 DOA: 10/22/2016 PCP: Kenn File, MD   Brief Narrative: 83 y.o.malewith medical history significant of chronic atrial fibrillation, anxiety, aortic root enlargement, osteoarthritis, history of atrial thrombus, lymphoma, chronic systolic congestive heart failure, diverticulosis, GERD, hemorrhoids, hypertension, history of pneumonia, recently treated for influenza who is being referred to the emergency department by his primary care provider due to progressively worsening jaundice. CT scan abdomen/pelvis showed moderate intrahepatic biliary ductal dilation, mild distention of the gallbladder, distention of the CBD suspicious for CBD or pancreatic carcinoma. ERCP unsuccessful. External biliary drainage was placed by Ir.  Assessment & Plan:  #  Obstructive jaundice worrisome for cholangiocarcinoma versus pancreatic cancer: ? Acute cholangitis: -Status post external drain was placed by interventional radiologist. CA 19-9 significantly elevated. This along with pancreatic mass on imaging studies indicates very likely pancreatic cancer or cholangiocarcinoma. -However the cultures of biliary fluid from the procedure growing gram-negative rods, abundant Enterobacter species. -Continue cefepime pending sensitivity culture results for presumed cholangitis. -Further plan as per gastroenterologist, may need tissue biopsy. This is likely after completion of antibiotics course for -Continue supportive care including Zofran as needed, Protonix orally -added oxycodone prn for pain management at the site of the drain.   #Chronic systolic CHF: Stable now. Echo in May 2017 showed EF of 40-45%.  #Atrial fibrillation, exact type unknown: Cardiology consult appreciated. Holding coumadin until patient will be re-evaluated by cardiologist as an outpatient. -Patient had supratherapeutic INR on admission likely due to liver disease. Currently INR is  normal. Coumadin on hold.  #Hypertension: Blood pressure acceptable. Lisinopril on hold now. Continue to monitor.  #History of acid reflux: On Protonix  #Thrombocytopenia: Monitor platelet count. No sign of bleeding.Pending CBC result from today.  Principal Problem:   Jaundice Active Problems:   Chronic systolic heart failure (HCC)   Atrial fibrillation   HTN (hypertension), benign   Supratherapeutic INR   GERD (gastroesophageal reflux disease)   Anxiety   Common bile duct stricture   Hyperbilirubinemia  DVT prophylaxis: SCD. No anticoagulation because of thrombocytopenia Code Status: Full code Family Communication: Patient's daughter in law at bedside.  Disposition Plan: Likely discharge home in 1-2 days depending on GIs plan    Consultants:   GI  Cardiologist  IR  Procedures:  ERCP, external drain placement by IR Antimicrobials: Unasyn from 2/23-2/24. Cefepime from 2/24   Subjective: Patient was seen and examined at bedside. Patient reported dry cough and some discomfort around the site of external biliary drain. Denied nausea vomiting chest pain or shortness of breath.  Objective: Vitals:   10/26/16 1041 10/26/16 1731 10/26/16 2054 10/27/16 0520  BP: 119/65 127/69 136/73 102/60  Pulse: 67  75 62  Resp: 18  18 16   Temp: 98.1 F (36.7 C)  98.5 F (36.9 C) 98.7 F (37.1 C)  TempSrc: Oral  Oral Oral  SpO2: 98%  98% 97%  Weight:   90.8 kg (200 lb 1.6 oz)   Height:        Intake/Output Summary (Last 24 hours) at 10/27/16 1015 Last data filed at 10/27/16 0645  Gross per 24 hour  Intake             1025 ml  Output             1600 ml  Net             -575 ml   Filed Weights   10/24/16 2207 10/25/16 2049 10/26/16  2054  Weight: 90.5 kg (199 lb 9.6 oz) 91.4 kg (201 lb 8 oz) 90.8 kg (200 lb 1.6 oz)    Examination:  General exam: Appears calm and comfortable  Eye: + Icterus Improving Respiratory system: Clear bilateral, no wheezing. Respiratory effort  normal.  Cardiovascular system: S1 & S2 heard, RRR.  No pedal edema. Gastrointestinal system: Abdomen soft, nontender, nondistended. External biliary drain site clean Central nervous system: Alert and oriented. No focal neurological deficits. Extremities: Symmetric 5 x 5 power. Skin: Generalized icteric skin Psychiatry: Judgement and insight appear normal. Mood & affect appropriate.     Data Reviewed: I have personally reviewed following labs and imaging studies  CBC:  Recent Labs Lab 10/21/16 1010 10/22/16 1232 10/23/16 0607 10/24/16 0731 10/25/16 0520 10/27/16 0831  WBC 6.0 7.8 6.6 6.9 7.1 7.8  NEUTROABS 4.0 5.2 3.9 4.7 6.0  --   HGB  --   --  10.4* 9.7* 10.1* 10.8*  HCT 35.9* 33.8* 31.9* 29.1* 30.3* 33.2*  MCV 97 91 96.1 93.9 94.4 97.1  PLT 166 146* 121* 104* 63* PENDING   Basic Metabolic Panel:  Recent Labs Lab 10/23/16 0607 10/24/16 0731 10/25/16 0520 10/26/16 0703 10/26/16 1811 10/27/16 0831  NA 136 137 139 141  --  141  K 4.5 4.0 4.2 3.9  --  3.7  CL 102 108 107 113*  --  108  CO2 26 22 23 24   --  26  GLUCOSE 96 98 172* 96  --  128*  BUN 18 22* 27* 27*  --  24*  CREATININE 1.28* 1.22 1.18 1.26*  --  1.12  CALCIUM 8.5* 7.8* 8.3* 8.2*  --  8.5*  MG  --   --   --   --  1.9  --    GFR: Estimated Creatinine Clearance: 57.6 mL/min (by C-G formula based on SCr of 1.12 mg/dL). Liver Function Tests:  Recent Labs Lab 10/23/16 0607 10/24/16 0731 10/25/16 0520 10/26/16 0703 10/27/16 0831  AST 101* 91* 86* 56* 58*  ALT 116* 99* 94* 81* 78*  ALKPHOS 387* 346* 323* 282* 272*  BILITOT 10.4* 13.1* 14.9* 8.0* 9.4*  PROT 5.3* 4.7* 5.0* 5.1* 5.6*  ALBUMIN 2.3* 1.9* 2.0* 2.1* 2.3*   No results for input(s): LIPASE, AMYLASE in the last 168 hours. No results for input(s): AMMONIA in the last 168 hours. Coagulation Profile:  Recent Labs Lab 10/22/16 2105 10/23/16 0607 10/24/16 0731 10/25/16 0520 10/26/16 0703  INR 5.20* 4.62* 1.25 1.01 0.96   Cardiac  Enzymes: No results for input(s): CKTOTAL, CKMB, CKMBINDEX, TROPONINI in the last 168 hours. BNP (last 3 results) No results for input(s): PROBNP in the last 8760 hours. HbA1C: No results for input(s): HGBA1C in the last 72 hours. CBG: No results for input(s): GLUCAP in the last 168 hours. Lipid Profile: No results for input(s): CHOL, HDL, LDLCALC, TRIG, CHOLHDL, LDLDIRECT in the last 72 hours. Thyroid Function Tests: No results for input(s): TSH, T4TOTAL, FREET4, T3FREE, THYROIDAB in the last 72 hours. Anemia Panel: No results for input(s): VITAMINB12, FOLATE, FERRITIN, TIBC, IRON, RETICCTPCT in the last 72 hours. Sepsis Labs: No results for input(s): PROCALCITON, LATICACIDVEN in the last 168 hours.  Recent Results (from the past 240 hour(s))  Aerobic/Anaerobic Culture (surgical/deep wound)     Status: None (Preliminary result)   Collection Time: 10/25/16 10:49 AM  Result Value Ref Range Status   Specimen Description BILE  Final   Special Requests NONE  Final   Gram Stain  Final    FEW WBC PRESENT, PREDOMINANTLY PMN RARE GRAM NEGATIVE RODS    Culture   Final    ABUNDANT ENTEROBACTER SPECIES SUSCEPTIBILITIES TO FOLLOW    Report Status PENDING  Incomplete         Radiology Studies: Ir Int Lianne Cure Biliary Drain With Cholangiogram  Result Date: 10/25/2016 INDICATION: Obstructive jaundice, distal common bile duct obstruction concerning for malignant obstruction. Failed ERCP. EXAM: IR INT-EXT BILIARY DRAIN W/ CHOLANGIOGRAM MEDICATIONS: Unasyn 3 g; The antibiotic was administered within an appropriate time frame prior to the initiation of the procedure. ANESTHESIA/SEDATION: Moderate (conscious) sedation was employed during this procedure. A total of Versed 1.0 mg and Fentanyl 37.5 mcg was administered intravenously. Moderate Sedation Time: 20 minutes. The patient's level of consciousness and vital signs were monitored continuously by radiology nursing throughout the procedure under my  direct supervision. FLUOROSCOPY TIME:  Fluoroscopy Time: 2 minutes 30 seconds (11 mGy). COMPLICATIONS: None immediate. PROCEDURE: Informed written consent was obtained from the patient after a thorough discussion of the procedural risks, benefits and alternatives. All questions were addressed. Maximal Sterile Barrier Technique was utilized including caps, mask, sterile gowns, sterile gloves, sterile drape, hand hygiene and skin antiseptic. A timeout was performed prior to the initiation of the procedure. Previous imaging reviewed. Preliminary ultrasound performed. Dilated peripheral right hepatic ducts demonstrated. Overlying skin marked. Under sterile conditions and local anesthesia, ultrasound percutaneous needle access performed of a peripheral right hepatic duct. There was return of bile. Contrast injection performed for initial cholangiogram. Diffuse intrahepatic biliary dilatation evident. 018 guidewire advanced centrally followed by the Accustick dilator set. Contrast injection demonstrates diffuse biliary dilatation with a severe stricture/ obstruction of the mid CBD. Below the stricture, the bile duct is nondilated and drains into the duodenum. Catheter and guidewire access were manipulated through the common bile duct severe stricture. Catheter was advanced into the duodenum. Contrast injection confirms position in the duodenum. Amplatz guidewire exchange performed. 7 [place] internal external biliary drain and successfully advanced over the guidewire. Retention loop formed in the duodenum. Contrast injection confirms adequate drainage of the biliary tree. Filling defects within the biliary tree at the conclusion the procedure compatible with blood related to the intervention but no active bleeding appreciated. Catheter secured with a Prolene suture and connected to external gravity drainage bag. Sterile dressing applied. Patient tolerated the procedure well. No immediate complication. IMPRESSION:  Successful ultrasound and fluoroscopic 10 French right internal external biliary drain to relieve the biliary obstruction secondary to the mid CBD severe stricture / obstruction. Again, this is concerning for a malignant process. Electronically Signed   By: Jerilynn Mages.  Shick M.D.   On: 10/25/2016 11:24        Scheduled Meds: . ceFEPime (MAXIPIME) IV  2 g Intravenous Q24H  . indomethacin  100 mg Rectal Once  . pantoprazole  40 mg Oral Q0600  . polyethylene glycol  17 g Oral Daily   Continuous Infusions:   LOS: 5 days    Kallan Merrick Tanna Furry, MD [facility] Pager [phone]  If 7PM-7AM, please contact night-coverage [email or web address] Password [email or web address] 10/27/2016, 10:15 AM

## 2016-10-27 NOTE — Care Management Note (Addendum)
Case Management Note  Patient Details  Name: LUISFERNANDO BRIGHTWELL MRN: [id number] Date of Birth: 1932/03/15  Subjective/Objective:     CM following for progression and d/c planning.                Action/Plan: 10/27/2016 Met with pt and future daughter in law, Leanne. Pt states that he does not think that he needs HHPT or HHOT however may need a HHRN to assure that his drain is functioning correctly and that he is progressing. Pt lives in [place] , [place] has DME if needed and has used [facility] previously. This CM notified [facility] of pt needs.  10/30/2016 Pt insisting on d/c to home, wife unable to assist, [place], [place], [place] and aide requested.  Expected Discharge Date:  10/25/16               Expected Discharge Plan:  [place]  In-House Referral:  NA  Discharge planning Services  CM Consult  Post Acute Care Choice:  Home Health Choice offered to:  Patient  DME Arranged:  N/A DME Agency:  NA  HH Arranged:  HHRN, HHPT , [place] and [place] aide.  [place] Agency:  [place]  Status of Service:  Completed, signed off  If discussed at [place] of Stay Meetings, dates discussed:    Additional Comments:  Adron Bene, RN 10/27/2016, 12:26 PM

## 2016-10-27 NOTE — Progress Notes (Signed)
Daily Rounding Note  10/27/2016, 10:07 AM  LOS: 5 days   SUBJECTIVE:   Chief complaint:  Still feels weak but overall feels better  The perc drain site is uncomfortable but pt prefers not to take pain meds if he can help it.  No N/V and tolerating HH diet.  OBJECTIVE:         Vital signs in last 24 hours:    Temp:  [98.1 F (36.7 C)-98.7 F (37.1 C)] 98.7 F (37.1 C) (02/26 0520) Pulse Rate:  [62-75] 62 (02/26 0520) Resp:  [16-18] 16 (02/26 0520) BP: (102-136)/(60-73) 102/60 (02/26 0520) SpO2:  [97 %-98 %] 97 % (02/26 0520) Weight:  [90.8 kg (200 lb 1.6 oz)] 90.8 kg (200 lb 1.6 oz) (02/25 2054) Last BM Date: 10/24/16 Filed Weights   10/24/16 2207 10/25/16 2049 10/26/16 2054  Weight: 90.5 kg (199 lb 9.6 oz) 91.4 kg (201 lb 8 oz) 90.8 kg (200 lb 1.6 oz)   General: still jaundiced.  Weak, frail looking  Heart: irreg, irreg Chest: clear bil but overall reduced BS.  No cough, some dyspnea Abdomen: soft, ND.  Active BS.  Perc site on right is tender, no guard or rebound.  Brown, clear bile in drain bag.    Extremities: no CCE Neuro/Psych:  Oriented x 3.  Moves all 4s  Intake/Output from previous day: 02/25 0701 - 02/26 0700 In: 1365 [P.O.:1200; IV Piggyback:150] Out: 1850 [Urine:750; Drains:1100]  Intake/Output this shift: No intake/output data recorded.  Lab Results:  Recent Labs  10/25/16 0520 10/27/16 0831  WBC 7.1 7.8  HGB 10.1* 10.8*  HCT 30.3* 33.2*  PLT 63* PENDING   BMET  Recent Labs  10/25/16 0520 10/26/16 0703  NA 139 141  K 4.2 3.9  CL 107 113*  CO2 23 24  GLUCOSE 172* 96  BUN 27* 27*  CREATININE 1.18 1.26*  CALCIUM 8.3* 8.2*   LFT  Recent Labs  10/25/16 0520 10/26/16 0703  PROT 5.0* 5.1*  ALBUMIN 2.0* 2.1*  AST 86* 56*  ALT 94* 81*  ALKPHOS 323* 282*  BILITOT 14.9* 8.0*   PT/INR  Recent Labs  10/25/16 0520 10/26/16 0703  LABPROT 13.3 12.8  INR 1.01 0.96    Hepatitis Panel No results for input(s): HEPBSAG, HCVAB, HEPAIGM, HEPBIGM in the last 72 hours.  Studies/Results: Ir Int Lianne Cure Biliary Drain With Cholangiogram  Result Date: 10/25/2016 INDICATION: Obstructive jaundice, distal common bile duct obstruction concerning for malignant obstruction. Failed ERCP. EXAM: IR INT-EXT BILIARY DRAIN W/ CHOLANGIOGRAM MEDICATIONS: Unasyn 3 g; The antibiotic was administered within an appropriate time frame prior to the initiation of the procedure. ANESTHESIA/SEDATION: Moderate (conscious) sedation was employed during this procedure. A total of Versed 1.0 mg and Fentanyl 37.5 mcg was administered intravenously. Moderate Sedation Time: 20 minutes. The patient's level of consciousness and vital signs were monitored continuously by radiology nursing throughout the procedure under my direct supervision. FLUOROSCOPY TIME:  Fluoroscopy Time: 2 minutes 30 seconds (11 mGy). COMPLICATIONS: None immediate. PROCEDURE: Informed written consent was obtained from the patient after a thorough discussion of the procedural risks, benefits and alternatives. All questions were addressed. Maximal Sterile Barrier Technique was utilized including caps, mask, sterile gowns, sterile gloves, sterile drape, hand hygiene and skin antiseptic. A timeout was performed prior to the initiation of the procedure. Previous imaging reviewed. Preliminary ultrasound performed. Dilated peripheral right hepatic ducts demonstrated. Overlying skin marked. Under sterile conditions and local anesthesia, ultrasound percutaneous needle  access performed of a peripheral right hepatic duct. There was return of bile. Contrast injection performed for initial cholangiogram. Diffuse intrahepatic biliary dilatation evident. 018 guidewire advanced centrally followed by the Accustick dilator set. Contrast injection demonstrates diffuse biliary dilatation with a severe stricture/ obstruction of the mid CBD. Below the stricture, the  bile duct is nondilated and drains into the duodenum. Catheter and guidewire access were manipulated through the common bile duct severe stricture. Catheter was advanced into the duodenum. Contrast injection confirms position in the duodenum. Amplatz guidewire exchange performed. 26 [place] internal external biliary drain and successfully advanced over the guidewire. Retention loop formed in the duodenum. Contrast injection confirms adequate drainage of the biliary tree. Filling defects within the biliary tree at the conclusion the procedure compatible with blood related to the intervention but no active bleeding appreciated. Catheter secured with a Prolene suture and connected to external gravity drainage bag. Sterile dressing applied. Patient tolerated the procedure well. No immediate complication. IMPRESSION: Successful ultrasound and fluoroscopic 10 French right internal external biliary drain to relieve the biliary obstruction secondary to the mid CBD severe stricture / obstruction. Again, this is concerning for a malignant process. Electronically Signed   By: Jerilynn Mages.  Shick M.D.   On: 10/25/2016 11:24   Scheduled Meds: . ceFEPime (MAXIPIME) IV  2 g Intravenous Q24H  . indomethacin  100 mg Rectal Once  . pantoprazole  40 mg Oral Q0600  . polyethylene glycol  17 g Oral Daily   Continuous Infusions: PRN Meds:.albuterol, dextromethorphan-guaiFENesin, LORazepam, ondansetron **OR** ondansetron (ZOFRAN) IV   ASSESMENT:   *  Obstructive jaundice.  cholangio vs pancreatic cancer, CA 19-9 1581.   2/23 ERCP failed due to stricture at CBD.    2/24 PTC drain.  Drain output: 1475 ml, 1100 ml in previous 2 days.  Overall improved T bili/LFTs but T bil up 8 to 9.4 in last 24 hours.  No pathology or cytology pending.      *  Cholangitis.  Enterobacter growing from bile.  Day 4 abx: Unasyn >> Cefepime.   *  Chronic Coumadin for A fib.  All AC on hold.   *  Normocytic anemia, stable. Anemia dates to 2014.    04/2013 colonoscopy: HP polyps.    *  Thrombocytopenia.      PLAN   *   ? EUS/biopsy for purpose of tissue diagnosis.  *   Drain mgt per IR  *   Needs to start walking and get out of bed. OT saw pt yesterday.    Azucena Freed  10/27/2016, 10:07 AM Pager: [phone]

## 2016-10-27 NOTE — Progress Notes (Signed)
Patient ID: Jimmy Myers, male   DOB: [birth year], 83 y.o.   MRN: [id number]    Referring Physician(s): Dr. Wilfrid Lund  Supervising Physician: Arne Cleveland  Patient Status: [facility] - In-pt  Chief Complaint: Obstructive jaundice  Subjective: Patient having some discomfort, but overall improving.  Allergies: Codeine; Simvastatin; and Vicodin [hydrocodone-acetaminophen]  Medications: Prior to Admission medications   Medication Sig Start Date End Date Taking? Authorizing Provider  albuterol (PROVENTIL HFA;VENTOLIN HFA) 108 (90 BASE) MCG/ACT inhaler Inhale 2 puffs into the lungs every 6 (six) hours as needed for wheezing or shortness of breath.   Yes Historical Provider, MD  clorazepate (TRANXENE) 3.75 MG tablet Take 1.875-3.75 mg by mouth daily as needed for anxiety.    Yes Historical Provider, MD  Emollient (EUCERIN) lotion Apply 3-5 mLs topically daily.    Yes Historical Provider, MD  fluticasone (FLONASE) 50 MCG/ACT nasal spray Place 1 spray into both nostrils daily as needed for allergies or rhinitis.   Yes Historical Provider, MD  folic acid (FOLVITE) 258 MCG tablet Take 800 mcg by mouth daily.   Yes Historical Provider, MD  furosemide (LASIX) 40 MG tablet TAKE ONE TABLET BY MOUTH ONCE DAILY 09/26/16  Yes Burtis Junes, NP  guaiFENesin (MUCINEX) 600 MG 12 hr tablet Take 600 mg by mouth 2 (two) times daily as needed.   Yes Historical Provider, MD  lisinopril (PRINIVIL,ZESTRIL) 5 MG tablet TAKE ONE TABLET BY MOUTH ONCE DAILY Patient taking differently: TAKE ONE TABLET BY MOUTH ONCE DAILY AT 5PM 06/27/16  Yes Burtis Junes, NP  loperamide (IMODIUM A-D) 2 MG tablet Take 2 mg by mouth 4 (four) times daily as needed for diarrhea or loose stools.   Yes Historical Provider, MD  nitroGLYCERIN (NITROSTAT) 0.4 MG SL tablet Place 1 tablet (0.4 mg total) under the tongue every 5 (five) minutes x 3 doses as needed for chest pain. 05/15/14 10/06/17 Yes Burtis Junes, NP  omeprazole (PRILOSEC) 20 MG  capsule Take 20 mg by mouth daily before breakfast.    Yes Historical Provider, MD  phytonadione (VITAMIN K) 5 MG tablet Take 5 mg by mouth once.   Yes Historical Provider, MD  polyethylene glycol (MIRALAX / GLYCOLAX) packet Take 17 g by mouth at bedtime as needed.    Yes Historical Provider, MD  potassium chloride SA (K-DUR,KLOR-CON) 20 MEQ tablet Take 20 mEq by mouth 2 (two) times daily.   Yes Historical Provider, MD  warfarin (COUMADIN) 5 MG tablet Take as directed by coumadin clinic Patient taking differently: Take 2.5-5 mg by mouth See admin instructions. Take 2.'5mg'$  on MONDAYS AND FRIDAYS, take '5mg'$ s daily on all other days of the week 07/13/13  Yes Thompson Grayer, MD  cefdinir (OMNICEF) 300 MG capsule Take 300 mg by mouth 2 (two) times daily. 10/09/16   Historical Provider, MD  oseltamivir (TAMIFLU) 75 MG capsule Take 75 mg by mouth 2 (two) times daily. 10/09/16   Historical Provider, MD    Vital Signs: BP 128/64 (BP Location: Right Arm)   Pulse 72   Temp 97.5 F (36.4 C) (Oral)   Resp 18   Ht '5\' 10"'$  (1.778 m)   Wt 200 lb 1.6 oz (90.8 kg)   SpO2 98%   BMI 28.71 kg/m   Physical Exam: Abd: soft, mildly tender around his drain, drain with bilious output present, 1100cc yesterday.  Drain site is clean.  Imaging: Dg C-arm 1-60 Min-no Report  Result Date: 10/24/2016 Fluoroscopy was utilized by the requesting physician.  No radiographic interpretation.   Ir Int Lianne Cure Biliary Drain With Cholangiogram  Result Date: 10/25/2016 INDICATION: Obstructive jaundice, distal common bile duct obstruction concerning for malignant obstruction. Failed ERCP. EXAM: IR INT-EXT BILIARY DRAIN W/ CHOLANGIOGRAM MEDICATIONS: Unasyn 3 g; The antibiotic was administered within an appropriate time frame prior to the initiation of the procedure. ANESTHESIA/SEDATION: Moderate (conscious) sedation was employed during this procedure. A total of Versed 1.0 mg and Fentanyl 37.5 mcg was administered intravenously. Moderate  Sedation Time: 20 minutes. The patient's level of consciousness and vital signs were monitored continuously by radiology nursing throughout the procedure under my direct supervision. FLUOROSCOPY TIME:  Fluoroscopy Time: 2 minutes 30 seconds (11 mGy). COMPLICATIONS: None immediate. PROCEDURE: Informed written consent was obtained from the patient after a thorough discussion of the procedural risks, benefits and alternatives. All questions were addressed. Maximal Sterile Barrier Technique was utilized including caps, mask, sterile gowns, sterile gloves, sterile drape, hand hygiene and skin antiseptic. A timeout was performed prior to the initiation of the procedure. Previous imaging reviewed. Preliminary ultrasound performed. Dilated peripheral right hepatic ducts demonstrated. Overlying skin marked. Under sterile conditions and local anesthesia, ultrasound percutaneous needle access performed of a peripheral right hepatic duct. There was return of bile. Contrast injection performed for initial cholangiogram. Diffuse intrahepatic biliary dilatation evident. 018 guidewire advanced centrally followed by the Accustick dilator set. Contrast injection demonstrates diffuse biliary dilatation with a severe stricture/ obstruction of the mid CBD. Below the stricture, the bile duct is nondilated and drains into the duodenum. Catheter and guidewire access were manipulated through the common bile duct severe stricture. Catheter was advanced into the duodenum. Contrast injection confirms position in the duodenum. Amplatz guidewire exchange performed. 51 [place] internal external biliary drain and successfully advanced over the guidewire. Retention loop formed in the duodenum. Contrast injection confirms adequate drainage of the biliary tree. Filling defects within the biliary tree at the conclusion the procedure compatible with blood related to the intervention but no active bleeding appreciated. Catheter secured with a Prolene  suture and connected to external gravity drainage bag. Sterile dressing applied. Patient tolerated the procedure well. No immediate complication. IMPRESSION: Successful ultrasound and fluoroscopic 10 French right internal external biliary drain to relieve the biliary obstruction secondary to the mid CBD severe stricture / obstruction. Again, this is concerning for a malignant process. Electronically Signed   By: Jerilynn Mages.  Shick M.D.   On: 10/25/2016 11:24    Labs:  CBC:  Recent Labs  10/23/16 0607 10/24/16 0731 10/25/16 0520 10/27/16 0831  WBC 6.6 6.9 7.1 7.8  HGB 10.4* 9.7* 10.1* 10.8*  HCT 31.9* 29.1* 30.3* 33.2*  PLT 121* 104* 63* 106*    COAGS:  Recent Labs  10/23/16 0607 10/24/16 0731 10/25/16 0520 10/26/16 0703  INR 4.62* 1.25 1.01 0.96    BMP:  Recent Labs  10/24/16 0731 10/25/16 0520 10/26/16 0703 10/27/16 0831  NA 137 139 141 141  K 4.0 4.2 3.9 3.7  CL 108 107 113* 108  CO2 '22 23 24 26  '$ GLUCOSE 98 172* 96 128*  BUN 22* 27* 27* 24*  CALCIUM 7.8* 8.3* 8.2* 8.5*  CREATININE 1.22 1.18 1.26* 1.12  GFRNONAA 53* 56* 51* 59*  GFRAA >60 >60 60* >60    LIVER FUNCTION TESTS:  Recent Labs  10/24/16 0731 10/25/16 0520 10/26/16 0703 10/27/16 0831  BILITOT 13.1* 14.9* 8.0* 9.4*  AST 91* 86* 56* 58*  ALT 99* 94* 81* 78*  ALKPHOS 346* 323* 282* 272*  PROT 4.7* 5.0*  5.1* 5.6*  ALBUMIN 1.9* 2.0* 2.1* 2.3*    Assessment and Plan: 1. Obstructive jaundice, s/p PTC with I/E drain on 2/24 -we will plan to proceed with brushings for cytology tomorrow to help with a diagnosis.  His CA 19-9 is 1500, which is likely leaning towards a pancreatic malignancy -his drain is draining well.  TB has stabilized around 9. We will follow this and allow it to continue to trend down prior to capping trial. -we will follow the patient. -NPO p MN for brushings tomorrow   Electronically Signed: Johnathen Testa E 10/27/2016, 1:54 PM   I spent a total of 15 Minutes at the the  patient's bedside AND on the patient's hospital floor or unit, greater than 50% of which was counseling/coordinating care for obstructive jaundice

## 2016-10-28 ENCOUNTER — Encounter (HOSPITAL_COMMUNITY): Payer: Self-pay | Admitting: Interventional Radiology

## 2016-10-28 ENCOUNTER — Inpatient Hospital Stay (HOSPITAL_COMMUNITY): Payer: PPO

## 2016-10-28 DIAGNOSIS — I10 Essential (primary) hypertension: Secondary | ICD-10-CM

## 2016-10-28 HISTORY — PX: IR GENERIC HISTORICAL: IMG1180011

## 2016-10-28 MED ORDER — MIDAZOLAM HCL 2 MG/2ML IJ SOLN
INTRAMUSCULAR | Status: AC
  Filled 2016-10-28: qty 2

## 2016-10-28 MED ORDER — FENTANYL CITRATE (PF) 100 MCG/2ML IJ SOLN
INTRAMUSCULAR | Status: AC | PRN
  Administered 2016-10-28 (×2): 50 ug via INTRAVENOUS

## 2016-10-28 MED ORDER — MIDAZOLAM HCL 2 MG/2ML IJ SOLN
INTRAMUSCULAR | Status: AC | PRN
  Administered 2016-10-28 (×2): 1 mg via INTRAVENOUS

## 2016-10-28 MED ORDER — LIDOCAINE HCL (PF) 1 % IJ SOLN
INTRAMUSCULAR | Status: AC
  Filled 2016-10-28: qty 30

## 2016-10-28 MED ORDER — FENTANYL CITRATE (PF) 100 MCG/2ML IJ SOLN
INTRAMUSCULAR | Status: AC
  Filled 2016-10-28: qty 2

## 2016-10-28 MED ORDER — LIDOCAINE HCL 1 % IJ SOLN
INTRAMUSCULAR | Status: AC | PRN
  Administered 2016-10-28: 10 mL

## 2016-10-28 MED ORDER — IOPAMIDOL (ISOVUE-300) INJECTION 61%
INTRAVENOUS | Status: AC
  Administered 2016-10-28: 15 mL
  Filled 2016-10-28: qty 50

## 2016-10-28 NOTE — Sedation Documentation (Signed)
Patient is resting comfortably. 

## 2016-10-28 NOTE — Progress Notes (Signed)
Occupational Therapy Treatment Patient Details Name: Jimmy Myers MRN: [id number] DOB: 05-04-1932 Today's Date: 10/28/2016    History of present illness Pt is an 83 y/o male admitted from home secondary to worsening jaundice (?carcinoma). ERCP performed on 10/24/16 with another endoscopy scheduled for 2/24. PMH including but not limited to a-fib, CHF, HTN and lymphoma.    OT comments  Pt min guard for ADLs and functional mobility. Demonstrates decreased endurance with need for rest break during grooming at sink. Benefit from skilled acute OT. Continue to recommend HHOT and 24/7 supervision to increase independence; dicussed with pt benefit of HHOT due to his generalized weakness and decreased endurance during ADLs.    Follow Up Recommendations  Home health OT;Supervision/Assistance - 24 hour    Equipment Recommendations  3 in 1 bedside commode    Recommendations for Other Services      Precautions / Restrictions Precautions Precautions: Fall Restrictions Weight Bearing Restrictions: No       Mobility Bed Mobility Overal bed mobility: Modified Independent Bed Mobility: Rolling;Supine to Sit;Sit to Supine Rolling: Modified independent (Device/Increase time)   Supine to sit: Min guard Sit to supine: Min guard   General bed mobility comments: increased time  Transfers Overall transfer level: Needs assistance Equipment used: Rolling walker (2 wheeled) Transfers: Sit to/from Stand Sit to Stand: Min guard              Balance Overall balance assessment: Needs assistance Sitting-balance support: Feet supported;No upper extremity supported Sitting balance-Leahy Scale: Good     Standing balance support: Bilateral upper extremity supported;During functional activity Standing balance-Leahy Scale: Fair Standing balance comment: dynamic standing sink for ADLs with min guard.                    ADL Overall ADL's : Needs assistance/impaired     Grooming: Min  guard;Standing Grooming Details (indicate cue type and reason): Pt shaved at sink with one rest break                             Functional mobility during ADLs: Min guard;Rolling walker General ADL Comments: Pt performs grooming at sink with Min gaurd; however, demonstrted decreased endurance and requires seated rest break      Vision                     Perception     Praxis      Cognition   Behavior During Therapy: WFL for tasks assessed/performed Overall Cognitive Status: Within Functional Limits for tasks assessed                         Exercises     Shoulder Instructions       General Comments      Pertinent Vitals/ Pain       Pain Assessment: 0-10 (Pt says there is only pain when he moves in a certain way) Pain Score: 8  Pain Location: R side of abdomen Pain Descriptors / Indicators: Aching;Guarding;Sore Pain Intervention(s): Monitored during session  Home Living                                          Prior Functioning/Environment              Frequency  Min 2X/week  Progress Toward Goals  OT Goals(current goals can now be found in the care plan section)  Progress towards OT goals: Progressing toward goals  Acute Rehab OT Goals Patient Stated Goal: return home OT Goal Formulation: With patient Time For Goal Achievement: 11/09/16 Potential to Achieve Goals: Good ADL Goals Pt Will Perform Grooming: with modified independence;standing Pt Will Perform Upper Body Bathing: with modified independence;sitting Pt Will Perform Lower Body Bathing: with modified independence;sit to/from stand Pt Will Transfer to Toilet: with modified independence;ambulating;bedside commode Pt Will Perform Toileting - Clothing Manipulation and hygiene: with modified independence;sit to/from stand Pt Will Perform Tub/Shower Transfer: Tub transfer;with supervision;ambulating;3 in 1;rolling walker  Plan Discharge  plan remains appropriate    Co-evaluation                 End of Session Equipment Utilized During Treatment: Gait belt;Rolling walker  OT Visit Diagnosis: Unsteadiness on feet (R26.81);Muscle weakness (generalized) (M62.81)   Activity Tolerance Patient tolerated treatment well   Patient Left in bed;with call bell/phone within reach   Nurse Communication Mobility status        Time: [id number] OT Time Calculation (min): 37 min  Charges: OT General Charges $OT Visit: 1 Procedure OT Treatments $Self Care/Home Management : 23-37 mins  [place], OTR/L [place] 10/28/2016, 11:56 AM

## 2016-10-28 NOTE — Progress Notes (Signed)
Patient returned from procedure. Orders followed and implemented. V/S obtained per order. Site assessed per order. No complications. Will continue to monitor.

## 2016-10-28 NOTE — Progress Notes (Deleted)
Occupational Therapy Treatment   Pt performed functional mobility and shaved at sink with min guard A with RW.  Pt demonstrated decreased endurance as seen by required rest break during ADLs. Continue to follow acutely to increase independence and facilitate safe dc home. Recommend HHOT to increase pt's safety and occupational performance. Educated pt on the benefits of HHOT to increase participation in ADLs.      10/28/16 1100  OT Visit Information  Last OT Received On 10/28/16  Assistance Needed +1  History of Present Illness Pt is an 83 y/o male admitted from home secondary to worsening jaundice (?carcinoma). ERCP performed on 10/24/16 with another endoscopy scheduled for 2/24. PMH including but not limited to a-fib, CHF, HTN and lymphoma.   Precautions  Precautions Fall  Pain Assessment  Pain Assessment 0-10 (Pt says there is only pain when he moves in a certain way)  Pain Score 8  Pain Location R side of abdomen  Pain Descriptors / Indicators Aching;Guarding;Sore  Pain Intervention(s) Monitored during session  Cognition  Arousal/Alertness Awake/alert  Behavior During Therapy WFL for tasks assessed/performed  Overall Cognitive Status Within Functional Limits for tasks assessed  ADL  Overall ADL's  Needs assistance/impaired  Grooming Min guard;Standing  Grooming Details (indicate cue type and reason) Pt shaved at sink with one rest break  Functional mobility during ADLs Rolling walker;Min guard  General ADL Comments Pt performs grooming at sink with Min gaurd; however, demonstrted decreased endurance and requires seated rest break  Bed Mobility  Overal bed mobility Modified Independent  Bed Mobility Rolling;Supine to Sit;Sit to Supine  Rolling Modified independent (Device/Increase time)  Supine to sit Min guard  Sit to supine Min guard  General bed mobility comments increased time  Balance  Overall balance assessment Needs assistance  Sitting-balance support Feet  supported;No upper extremity supported  Sitting balance-Leahy Scale Good  Standing balance support Bilateral upper extremity supported;During functional activity  Standing balance-Leahy Scale Fair  Standing balance comment dynamic standing sink for ADLs with min guard.   Restrictions  Weight Bearing Restrictions No  Transfers  Overall transfer level Needs assistance  Equipment used Rolling walker (2 wheeled)  Transfers Sit to/from Stand  Sit to Stand Min guard  OT - End of Session  Equipment Utilized During Treatment Gait belt;Rolling walker  Activity Tolerance Patient tolerated treatment well  Patient left in bed;with call bell/phone within reach  Nurse Communication Mobility status  OT Assessment/Plan  OT Plan Discharge plan remains appropriate  OT Visit Diagnosis Unsteadiness on feet (R26.81);Muscle weakness (generalized) (M62.81)  OT Frequency (ACUTE ONLY) Min 2X/week  Follow Up Recommendations Home health OT;Supervision/Assistance - 24 hour  OT Equipment 3 in 1 bedside commode  AM-PAC OT "6 Clicks" Daily Activity Outcome Measure  Help from another person eating meals? 4  Help from another person taking care of personal grooming? 3  Help from another person toileting, which includes using toliet, bedpan, or urinal? 3  Help from another person bathing (including washing, rinsing, drying)? 3  Help from another person to put on and taking off regular upper body clothing? 3  Help from another person to put on and taking off regular lower body clothing? 3  6 Click Score 19  ADL G Code Conversion CK  OT Goal Progression  Progress towards OT goals Progressing toward goals  Acute Rehab OT Goals  Patient Stated Goal return home  OT Goal Formulation With patient  Time For Goal Achievement 11/09/16  Potential to Achieve Goals Good  ADL  Goals  Pt Will Perform Grooming with modified independence;standing  Pt Will Perform Upper Body Bathing with modified independence;sitting  Pt Will  Perform Lower Body Bathing with modified independence;sit to/from stand  Pt Will Transfer to Toilet with modified independence;ambulating;bedside commode  Pt Will Perform Toileting - Clothing Manipulation and hygiene with modified independence;sit to/from stand  Pt Will Perform Tub/Shower Transfer Tub transfer;with supervision;ambulating;3 in 1;rolling walker  OT Time Calculation  OT Start Time (ACUTE ONLY) 1102  OT Stop Time (ACUTE ONLY) 1139  OT Time Calculation (min) 37 min  OT General Charges  $OT Visit 1 Procedure  OT Treatments  $Self Care/Home Management  23-37 mins    [place], OTR/L [phone] 10/28/16

## 2016-10-28 NOTE — Procedures (Signed)
CBD obstruction and jaundice  S/p fluoro CBD brush bx x 3 and 10 fr drain replacement  No comp Stable Path pending Full report in PACS

## 2016-10-28 NOTE — Progress Notes (Signed)
PROGRESS NOTE    Jimmy Myers  [id number] DOB: [birth year] DOA: 10/22/2016 PCP: Kenn File, MD   Brief Narrative: 83 y.o.malewith medical history significant of chronic atrial fibrillation, anxiety, aortic root enlargement, osteoarthritis, history of atrial thrombus, lymphoma, chronic systolic congestive heart failure, diverticulosis, GERD, hemorrhoids, hypertension, history of pneumonia, recently treated for influenza who is being referred to the emergency department by his primary care provider due to progressively worsening jaundice. CT scan abdomen/pelvis showed moderate intrahepatic biliary ductal dilation, mild distention of the gallbladder, distention of the CBD suspicious for CBD or pancreatic carcinoma. ERCP unsuccessful. External biliary drainage was placed by Ir.  Assessment & Plan:  #  Obstructive jaundice worrisome for cholangiocarcinoma versus pancreatic cancer: ? Acute cholangitis: -Status post external drain was placed by interventional radiologist. CA 19-9 significantly elevated. This along with pancreatic mass on imaging studies indicates very likely pancreatic cancer or cholangiocarcinoma. -However the cultures of biliary fluid from the procedure growing gram-negative rods, abundant Enterobacter species, sensitive to cefepime, continue for now.  -plan for tissue brushing for cytology today by IR. -Follow-up GIs and INRs further plan and recommended. -Continue supportive care including Zofran as needed, Protonix orally -Continue oxycodone prn for pain management at the site of the drain.  -Liver enzymes are trending down, bilirubin level 9.4. Repeat lab in the morning.  #Chronic systolic CHF: Stable now. Echo in May 2017 showed EF of 40-45%.  #Atrial fibrillation, exact type unknown: Cardiology consult appreciated. Holding coumadin until patient will be re-evaluated by cardiologist as an outpatient. -Patient had supratherapeutic INR on admission likely due to liver  disease. Currently INR is normal. Coumadin on hold.  #Hypertension: Blood pressure low this morning. Lisinopril on hold now. Continue to monitor.   #History of acid reflux: On Protonix  #Thrombocytopenia: Monitor platelet count. No sign of bleeding. Last plt 106 on 2/26.  Principal Problem:   Jaundice Active Problems:   Chronic systolic heart failure (HCC)   Atrial fibrillation   HTN (hypertension), benign   Supratherapeutic INR   GERD (gastroesophageal reflux disease)   Anxiety   Common bile duct stricture   Hyperbilirubinemia   Obstructive jaundice  DVT prophylaxis: SCD. No anticoagulation because of thrombocytopenia Code Status: Full code Family Communication: No family present at bedside today. Disposition Plan: Likely discharge home in 1-2 days depending on GIs plan    Consultants:   GI  Cardiologist  IR  Procedures:  ERCP, external drain placement by IR Antimicrobials: Unasyn from 2/23-2/24. Cefepime from 2/24--   Subjective: Patient was seen and examined at bedside. No new event. Blood pressure was borderline this morning. Denied headache, dizziness, nausea, vomiting, chest pain or shortness of breath. Pain is controlled with the current medications.  Objective: Vitals:   10/27/16 1813 10/27/16 2113 10/28/16 0447 10/28/16 0903  BP: 132/70 (!) 109/59 122/84 (!) 99/57  Pulse: 84 74 66 88  Resp: 18 18 18 17   Temp: 98.1 F (36.7 C) 98.9 F (37.2 C) 97.8 F (36.6 C) 98.4 F (36.9 C)  TempSrc: Oral Oral Oral Oral  SpO2: 98% 98% 97% 98%  Weight:  89.4 kg (197 lb 1.6 oz)    Height:        Intake/Output Summary (Last 24 hours) at 10/28/16 1243 Last data filed at 10/28/16 1115  Gross per 24 hour  Intake              365 ml  Output             1700 ml  Net            -1335 ml   Filed Weights   10/25/16 2049 10/26/16 2054 10/27/16 2113  Weight: 91.4 kg (201 lb 8 oz) 90.8 kg (200 lb 1.6 oz) 89.4 kg (197 lb 1.6 oz)    Examination:  General exam:  Not in distress, sitting on chair comfortable Eye: + Icterus Unchanged today. Respiratory system: Clear bilaterally, no wheezing. Respiratory effort normal.  Cardiovascular system: S1 & S2 heard, RRR.  No pedal edema. Gastrointestinal system: Abdomen soft, nontender, nondistended. Bowel sound positive. External biliary drain site clean. Central nervous system: Alert and oriented. No focal neurological deficits. Extremities: Symmetric 5 x 5 power. Skin: Generalized icteric skin Psychiatry: Judgement and insight appear normal. Mood & affect appropriate.     Data Reviewed: I have personally reviewed following labs and imaging studies  CBC:  Recent Labs Lab 10/22/16 1232 10/23/16 0607 10/24/16 0731 10/25/16 0520 10/27/16 0831  WBC 7.8 6.6 6.9 7.1 7.8  NEUTROABS 5.2 3.9 4.7 6.0  --   HGB  --  10.4* 9.7* 10.1* 10.8*  HCT 33.8* 31.9* 29.1* 30.3* 33.2*  MCV 91 96.1 93.9 94.4 97.1  PLT 146* 121* 104* 63* [id number]*   Basic Metabolic Panel:  Recent Labs Lab 10/23/16 0607 10/24/16 0731 10/25/16 0520 10/26/16 0703 10/26/16 1811 10/27/16 0831  NA 136 137 139 141  --  141  K 4.5 4.0 4.2 3.9  --  3.7  CL 102 108 107 113*  --  108  CO2 26 22 23 24   --  26  GLUCOSE 96 98 172* 96  --  128*  BUN 18 22* 27* 27*  --  24*  CREATININE 1.28* 1.22 1.18 1.26*  --  1.12  CALCIUM 8.5* 7.8* 8.3* 8.2*  --  8.5*  MG  --   --   --   --  1.9  --    GFR: Estimated Creatinine Clearance: 57.3 mL/min (by C-G formula based on SCr of 1.12 mg/dL). Liver Function Tests:  Recent Labs Lab 10/23/16 0607 10/24/16 0731 10/25/16 0520 10/26/16 0703 10/27/16 0831  AST 101* 91* 86* 56* 58*  ALT 116* 99* 94* 81* 78*  ALKPHOS 387* 346* 323* 282* 272*  BILITOT 10.4* 13.1* 14.9* 8.0* 9.4*  PROT 5.3* 4.7* 5.0* 5.1* 5.6*  ALBUMIN 2.3* 1.9* 2.0* 2.1* 2.3*   No results for input(s): LIPASE, AMYLASE in the last 168 hours. No results for input(s): AMMONIA in the last 168 hours. Coagulation Profile:  Recent  Labs Lab 10/22/16 2105 10/23/16 0607 10/24/16 0731 10/25/16 0520 10/26/16 0703  INR 5.20* 4.62* 1.25 1.01 0.96   Cardiac Enzymes: No results for input(s): CKTOTAL, CKMB, CKMBINDEX, TROPONINI in the last 168 hours. BNP (last 3 results) No results for input(s): PROBNP in the last 8760 hours. HbA1C: No results for input(s): HGBA1C in the last 72 hours. CBG: No results for input(s): GLUCAP in the last 168 hours. Lipid Profile: No results for input(s): CHOL, HDL, LDLCALC, TRIG, CHOLHDL, LDLDIRECT in the last 72 hours. Thyroid Function Tests: No results for input(s): TSH, T4TOTAL, FREET4, T3FREE, THYROIDAB in the last 72 hours. Anemia Panel: No results for input(s): VITAMINB12, FOLATE, FERRITIN, TIBC, IRON, RETICCTPCT in the last 72 hours. Sepsis Labs: No results for input(s): PROCALCITON, LATICACIDVEN in the last 168 hours.  Recent Results (from the past 240 hour(s))  Aerobic/Anaerobic Culture (surgical/deep wound)     Status: None (Preliminary result)   Collection Time: 10/25/16 10:49 AM  Result Value Ref Range  Status   Specimen Description BILE  Final   Special Requests NONE  Final   Gram Stain   Final    FEW WBC PRESENT, PREDOMINANTLY PMN RARE GRAM NEGATIVE RODS    Culture   Final    ABUNDANT ENTEROBACTER SPECIES CULTURE REINCUBATED FOR BETTER GROWTH NO ANAEROBES ISOLATED; CULTURE IN PROGRESS FOR 5 DAYS    Report Status PENDING  Incomplete   Organism ID, Bacteria ENTEROBACTER SPECIES  Final      Susceptibility   Enterobacter species - MIC*    CEFAZOLIN >=64 RESISTANT Resistant     CEFEPIME <=1 SENSITIVE Sensitive     CEFTAZIDIME <=1 SENSITIVE Sensitive     CEFTRIAXONE <=1 SENSITIVE Sensitive     CIPROFLOXACIN <=0.25 SENSITIVE Sensitive     GENTAMICIN <=1 SENSITIVE Sensitive     IMIPENEM 1 SENSITIVE Sensitive     TRIMETH/SULFA <=20 SENSITIVE Sensitive     PIP/TAZO 16 SENSITIVE Sensitive     * ABUNDANT ENTEROBACTER SPECIES         Radiology Studies: No  results found.      Scheduled Meds: . ceFEPime (MAXIPIME) IV  2 g Intravenous Q24H  . dextromethorphan-guaiFENesin  1 tablet Oral BID  . pantoprazole  40 mg Oral Q0600  . polyethylene glycol  17 g Oral Daily   Continuous Infusions:   LOS: 6 days    Camden Mazzaferro Tanna Furry, MD [facility] Pager [phone]  If 7PM-7AM, please contact night-coverage [email or web address] Password [facility] 10/28/2016, 12:43 PM

## 2016-10-29 DIAGNOSIS — C801 Malignant (primary) neoplasm, unspecified: Secondary | ICD-10-CM

## 2016-10-29 LAB — COMPREHENSIVE METABOLIC PANEL
ALT: 71 U/L — ABNORMAL HIGH (ref 17–63)
ANION GAP: 7 (ref 5–15)
AST: 56 U/L — ABNORMAL HIGH (ref 15–41)
Albumin: 2.1 g/dL — ABNORMAL LOW (ref 3.5–5.0)
Alkaline Phosphatase: 207 U/L — ABNORMAL HIGH (ref 38–126)
BUN: 27 mg/dL — ABNORMAL HIGH (ref 6–20)
CHLORIDE: 108 mmol/L (ref 101–111)
CO2: 24 mmol/L (ref 22–32)
Calcium: 8.4 mg/dL — ABNORMAL LOW (ref 8.9–10.3)
Creatinine, Ser: 1.11 mg/dL (ref 0.61–1.24)
GFR calc Af Amer: >60 mL/min (ref >60)
GFR calc non Af Amer: 60 mL/min — ABNORMAL LOW (ref >60)
Glucose, Bld: 103 mg/dL — ABNORMAL HIGH (ref 65–99)
Potassium: 3.5 mmol/L (ref 3.5–5.1)
SODIUM: 139 mmol/L (ref 135–145)
Total Bilirubin: 12 mg/dL — ABNORMAL HIGH (ref 0.3–1.2)
Total Protein: 5.4 g/dL — ABNORMAL LOW (ref 6.5–8.1)

## 2016-10-29 NOTE — Progress Notes (Signed)
Daily Rounding Note  10/29/2016, 10:53 AM  LOS: 7 days   SUBJECTIVE:   Chief complaint: some pain at perc drain site.  No n/v but appetite variable.     Biliary drain output 925 ml, 1400 ml in last 2 days. 550 ml so far today.  OBJECTIVE:         Vital signs in last 24 hours:    Temp:  [97.9 F (36.6 C)-100 F (37.8 C)] 97.9 F (36.6 C) (02/28 1008) Pulse Rate:  [69-99] 77 (02/28 1008) Resp:  [15-22] 17 (02/28 1008) BP: (110-142)/(61-88) 110/66 (02/28 1008) SpO2:  [97 %-100 %] 98 % (02/28 1008) Weight:  [86.7 kg (191 lb 3.2 oz)] 86.7 kg (191 lb 3.2 oz) (02/27 2234) Last BM Date: 10/28/16 Filed Weights   10/26/16 2054 10/27/16 2113 10/28/16 2234  Weight: 90.8 kg (200 lb 1.6 oz) 89.4 kg (197 lb 1.6 oz) 86.7 kg (191 lb 3.2 oz)   General: Markedly jaundiced. Comfortable sitting up in bedside chair.   Heart: RRR. Chest: Clear bilaterally. No dyspnea or cough. Abdomen: Soft. Slightly tender around the percutaneous drain site in the right abdomen. Bowel sounds active. Drainage into the bag is yellow, murky/opaque, question purulent.  Extremities: No CCE. Neuro/Psych:  Pleasant, oriented times 3. Fully alert.  HOH  Intake/Output from previous day: 02/27 0701 - 02/28 0700 In: 850 [P.O.:840] Out: 2000 [Urine:600; Drains:1400]  Intake/Output this shift: Total I/O In: 240 [P.O.:240] Out: 1000 [Urine:450; Drains:550]  Lab Results:  Recent Labs  10/27/16 0831  WBC 7.8  HGB 10.8*  HCT 33.2*  PLT 106*   BMET  Recent Labs  10/27/16 0831 10/29/16 0514  NA 141 139  K 3.7 3.5  CL 108 108  CO2 26 24  GLUCOSE 128* 103*  BUN 24* 27*  CREATININE 1.12 1.11  CALCIUM 8.5* 8.4*   LFT  Recent Labs  10/27/16 0831 10/29/16 0514  PROT 5.6* 5.4*  ALBUMIN 2.3* 2.1*  AST 58* 56*  ALT 78* 71*  ALKPHOS 272* 207*  BILITOT 9.4* 12.0*   PT/INR No results for input(s): LABPROT, INR in the last 72 hours. Hepatitis  Panel No results for input(s): HEPBSAG, HCVAB, HEPAIGM, HEPBIGM in the last 72 hours.  Studies/Results: Ir Exchange Biliary Drain  Result Date: 10/28/2016 INDICATION: Common bile duct obstruction, obstructive jaundice EXAM: IR EXCHANGE BILARY DRAIN; IR ENDOLUMINAL BIOPSY OF BILIARY TREE MEDICATIONS: Patient is already receiving antibiotics as an inpatient. ANESTHESIA/SEDATION: Moderate (conscious) sedation was employed during this procedure. A total of Versed 2.0 mg and Fentanyl 100 mcg was administered intravenously. Moderate Sedation Time: 12 minutes. The patient's level of consciousness and vital signs were monitored continuously by radiology nursing throughout the procedure under my direct supervision. FLUOROSCOPY TIME:  Fluoroscopy Time: 4 minutes 0 seconds (23 mGy). COMPLICATIONS: None immediate. PROCEDURE: Informed written consent was obtained from the patient after a thorough discussion of the procedural risks, benefits and alternatives. All questions were addressed. Maximal Sterile Barrier Technique was utilized including caps, mask, sterile gowns, sterile gloves, sterile drape, hand hygiene and skin antiseptic. A timeout was performed prior to the initiation of the procedure. Under sterile conditions and local anesthesia, the existing right internal external 10 [place] biliary drain was removed over an Amplatz guidewire. 8 French sheath inserted into the common bile duct across the CBD obstruction. Endoluminal brush biopsy performed of the CBD obstruction 3 times. Samples placed in saline. Over the guidewire, a 10 [place] internal external biliary  drain was replaced. Retention loop formed in the duodenum. Contrast injection confirms position. There is adequate drainage of the biliary tree. CBD obstruction persist. IMPRESSION: Successful endoluminal brush biopsy of the common bile duct obstruction. Replacement of the 10 [place] internal external right biliary drain catheter. Electronically Signed   By:  Jerilynn Mages.  Shick M.D.   On: 10/28/2016 14:51   Ir Endoluminal Bx Of Biliary Tree  Result Date: 10/28/2016 INDICATION: Common bile duct obstruction, obstructive jaundice EXAM: IR EXCHANGE BILARY DRAIN; IR ENDOLUMINAL BIOPSY OF BILIARY TREE MEDICATIONS: Patient is already receiving antibiotics as an inpatient. ANESTHESIA/SEDATION: Moderate (conscious) sedation was employed during this procedure. A total of Versed 2.0 mg and Fentanyl 100 mcg was administered intravenously. Moderate Sedation Time: 12 minutes. The patient's level of consciousness and vital signs were monitored continuously by radiology nursing throughout the procedure under my direct supervision. FLUOROSCOPY TIME:  Fluoroscopy Time: 4 minutes 0 seconds (23 mGy). COMPLICATIONS: None immediate. PROCEDURE: Informed written consent was obtained from the patient after a thorough discussion of the procedural risks, benefits and alternatives. All questions were addressed. Maximal Sterile Barrier Technique was utilized including caps, mask, sterile gowns, sterile gloves, sterile drape, hand hygiene and skin antiseptic. A timeout was performed prior to the initiation of the procedure. Under sterile conditions and local anesthesia, the existing right internal external 10 [place] biliary drain was removed over an Amplatz guidewire. 8 French sheath inserted into the common bile duct across the CBD obstruction. Endoluminal brush biopsy performed of the CBD obstruction 3 times. Samples placed in saline. Over the guidewire, a 10 [place] internal external biliary drain was replaced. Retention loop formed in the duodenum. Contrast injection confirms position. There is adequate drainage of the biliary tree. CBD obstruction persist. IMPRESSION: Successful endoluminal brush biopsy of the common bile duct obstruction. Replacement of the 10 [place] internal external right biliary drain catheter. Electronically Signed   By: Jerilynn Mages.  Shick M.D.   On: 10/28/2016 14:51   Scheduled  Meds: . ceFEPime (MAXIPIME) IV  2 g Intravenous Q24H  . dextromethorphan-guaiFENesin  1 tablet Oral BID  . pantoprazole  40 mg Oral Q0600  . polyethylene glycol  17 g Oral Daily   Continuous Infusions: PRN Meds:.albuterol, LORazepam, ondansetron **OR** ondansetron (ZOFRAN) IV, oxyCODONE   ASSESMENT:   *  Obstructive jaundice. Cholangio vs pancreatic cancer, CA 19-9 1581.   2/23 ERCP failed due to stricture at CBD.    2/24 PTC drain.  Drain output: 1475 ml, 1100 ml in previous 2 days.  2/27: IR performed brushing for path cytology of biliary stricture and replacement of int/ext biliary drain. Cytologies pending.   T bili rising; Alk phos improved.  AST/ALT slightly better.   *  Cholangitis.  Enterobacter growing from bile.  Unasyn x 2 days (enterobacter not sensitive to this Day 4 Cefepime (sensitive).   *  Chronic Coumadin for A fib.  All AC on hold.   *  Normocytic anemia, stable. Anemia dates to 2014.  04/2013 colonoscopy: HP polyps.    *  Thrombocytopenia.       PLAN   *  Awaiting cytologies.  If not diagnostic, will need likely need EUS    Azucena Freed  10/29/2016, 10:53 AM Pager: [phone]

## 2016-10-29 NOTE — Progress Notes (Signed)
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  Further plans for GI and primary service.   Braleigh Massoud E 3:00 PM 10/29/2016

## 2016-10-29 NOTE — Progress Notes (Signed)
PROGRESS NOTE    Jimmy Myers  [id number] DOB: 06/02/1932 DOA: 10/22/2016 PCP: Kenn File, MD   Brief Narrative: 83 y.o.malewith medical history significant of chronic atrial fibrillation, anxiety, aortic root enlargement, osteoarthritis, history of atrial thrombus, lymphoma, chronic systolic congestive heart failure, diverticulosis, GERD, hemorrhoids, hypertension, history of pneumonia, recently treated for influenza who is being referred to the emergency department by his primary care provider due to progressively worsening jaundice. CT scan abdomen/pelvis showed moderate intrahepatic biliary ductal dilation, mild distention of the gallbladder, distention of the CBD suspicious for CBD or pancreatic carcinoma. ERCP unsuccessful. External biliary drainage was placed by Ir.  Assessment & Plan:  #  Obstructive jaundice worrisome for cholangiocarcinoma versus pancreatic cancer: ? Acute cholangitis: -Status post external drain was placed by interventional radiologist. CA 19-9 significantly elevated. This along with pancreatic mass on imaging studies indicates very likely pancreatic cancer or cholangiocarcinoma. -However the cultures of biliary fluid from the procedure growing gram-negative rods, abundant Enterobacter species, sensitive to cefepime, continue for now.  -s/p tissue brushing for cytology by IR on 2/27. -Mild elevation in bilirubin level today. Follow-up GI and IR's plan. -Continue supportive care including Zofran as needed, Protonix orally -Continue oxycodone prn for pain management at the site of the drain.  -Liver enzymes are trending down, bilirubin level 12. Continue to monitor labs.  #Chronic systolic CHF: Stable now. Echo in May 2017 showed EF of 40-45%.  #Atrial fibrillation, exact type unknown: Cardiology consult appreciated. Holding coumadin until patient will be re-evaluated by cardiologist as an outpatient. -Patient had supratherapeutic INR on admission likely  due to liver disease. Currently INR is normal. Coumadin on hold.  #Hypertension: Blood pressure acceptable today. Lisinopril on hold now. Continue to monitor.   #History of acid reflux: On Protonix  #Thrombocytopenia: Monitor platelet count. No sign of bleeding. Last plt 106 on 2/26. Repeat CBC tomorrow.  Principal Problem:   Jaundice Active Problems:   Chronic systolic heart failure (HCC)   Atrial fibrillation   HTN (hypertension), benign   Supratherapeutic INR   GERD (gastroesophageal reflux disease)   Anxiety   Common bile duct stricture   Hyperbilirubinemia   Obstructive jaundice  DVT prophylaxis: SCD. No anticoagulation because of thrombocytopenia Code Status: Full code Family Communication: Discussed with the patient's wife at bedside Disposition Plan: Likely discharge home in 1-2 days depending on GI and IR's plan    Consultants:   GI  Cardiologist  IR  Procedures:  ERCP, external drain placement by IR Antimicrobials: Unasyn from 2/23-2/24. Cefepime from 2/24--   Subjective: Patient was seen and examined at bedside. No new event. Feels weak. Denied nausea vomiting and abdominal pain. No chest pain or shortness of breath. Objective: Vitals:   10/28/16 1800 10/28/16 2234 10/29/16 0624 10/29/16 1008  BP: 121/67 135/61 118/65 110/66  Pulse: 94 90 71 77  Resp: 18 18 18 17   Temp: 98.7 F (37.1 C) 100 F (37.8 C) 98.6 F (37 C) 97.9 F (36.6 C)  TempSrc: Oral Oral Oral Oral  SpO2: 98% 97% 97% 98%  Weight:  86.7 kg (191 lb 3.2 oz)    Height:        Intake/Output Summary (Last 24 hours) at 10/29/16 1346 Last data filed at 10/29/16 1044  Gross per 24 hour  Intake             1085 ml  Output             2550 ml  Net            -  1465 ml   Filed Weights   10/26/16 2054 10/27/16 2113 10/28/16 2234  Weight: 90.8 kg (200 lb 1.6 oz) 89.4 kg (197 lb 1.6 oz) 86.7 kg (191 lb 3.2 oz)    Examination:  General exam: Not in distress, Lying on bed  comfortable Eye: + Icterus,  unchanged.Marland Kitchen Respiratory system: Clear bilaterally, no wheezing. Respiratory effort normal.  Cardiovascular system: S1 & S2 heard, RRR.  No pedal edema. Gastrointestinal system: Abdomen soft, nontender. Bowel sound positive. External biliary drain present. No bleeding at the site. Central nervous system: Alert and oriented. No focal neurological deficits. Extremities: Symmetric 5 x 5 power. Skin: Generalized icteric skin Psychiatry: Judgement and insight appear normal. Mood & affect appropriate.     Data Reviewed: I have personally reviewed following labs and imaging studies  CBC:  Recent Labs Lab 10/23/16 0607 10/24/16 0731 10/25/16 0520 10/27/16 0831  WBC 6.6 6.9 7.1 7.8  NEUTROABS 3.9 4.7 6.0  --   HGB 10.4* 9.7* 10.1* 10.8*  HCT 31.9* 29.1* 30.3* 33.2*  MCV 96.1 93.9 94.4 97.1  PLT 121* 104* 63* [id number]*   Basic Metabolic Panel:  Recent Labs Lab 10/24/16 0731 10/25/16 0520 10/26/16 0703 10/26/16 1811 10/27/16 0831 10/29/16 0514  NA 137 139 141  --  141 139  K 4.0 4.2 3.9  --  3.7 3.5  CL 108 107 113*  --  108 108  CO2 22 23 24   --  26 24  GLUCOSE 98 172* 96  --  128* 103*  BUN 22* 27* 27*  --  24* 27*  CREATININE 1.22 1.18 1.26*  --  1.12 1.11  CALCIUM 7.8* 8.3* 8.2*  --  8.5* 8.4*  MG  --   --   --  1.9  --   --    GFR: Estimated Creatinine Clearance: 53 mL/min (by C-G formula based on SCr of 1.11 mg/dL). Liver Function Tests:  Recent Labs Lab 10/24/16 0731 10/25/16 0520 10/26/16 0703 10/27/16 0831 10/29/16 0514  AST 91* 86* 56* 58* 56*  ALT 99* 94* 81* 78* 71*  ALKPHOS 346* 323* 282* 272* 207*  BILITOT 13.1* 14.9* 8.0* 9.4* 12.0*  PROT 4.7* 5.0* 5.1* 5.6* 5.4*  ALBUMIN 1.9* 2.0* 2.1* 2.3* 2.1*   No results for input(s): LIPASE, AMYLASE in the last 168 hours. No results for input(s): AMMONIA in the last 168 hours. Coagulation Profile:  Recent Labs Lab 10/22/16 2105 10/23/16 0607 10/24/16 0731 10/25/16 0520  10/26/16 0703  INR 5.20* 4.62* 1.25 1.01 0.96   Cardiac Enzymes: No results for input(s): CKTOTAL, CKMB, CKMBINDEX, TROPONINI in the last 168 hours. BNP (last 3 results) No results for input(s): PROBNP in the last 8760 hours. HbA1C: No results for input(s): HGBA1C in the last 72 hours. CBG: No results for input(s): GLUCAP in the last 168 hours. Lipid Profile: No results for input(s): CHOL, HDL, LDLCALC, TRIG, CHOLHDL, LDLDIRECT in the last 72 hours. Thyroid Function Tests: No results for input(s): TSH, T4TOTAL, FREET4, T3FREE, THYROIDAB in the last 72 hours. Anemia Panel: No results for input(s): VITAMINB12, FOLATE, FERRITIN, TIBC, IRON, RETICCTPCT in the last 72 hours. Sepsis Labs: No results for input(s): PROCALCITON, LATICACIDVEN in the last 168 hours.  Recent Results (from the past 240 hour(s))  Aerobic/Anaerobic Culture (surgical/deep wound)     Status: None (Preliminary result)   Collection Time: 10/25/16 10:49 AM  Result Value Ref Range Status   Specimen Description BILE  Final   Special Requests NONE  Final   Gram Stain  Final    FEW WBC PRESENT, PREDOMINANTLY PMN RARE GRAM NEGATIVE RODS    Culture   Final    ABUNDANT ENTEROBACTER SPECIES NO ANAEROBES ISOLATED; CULTURE IN PROGRESS FOR 5 DAYS    Report Status PENDING  Incomplete   Organism ID, Bacteria ENTEROBACTER SPECIES  Final      Susceptibility   Enterobacter species - MIC*    CEFAZOLIN >=64 RESISTANT Resistant     CEFEPIME <=1 SENSITIVE Sensitive     CEFTAZIDIME <=1 SENSITIVE Sensitive     CEFTRIAXONE <=1 SENSITIVE Sensitive     CIPROFLOXACIN <=0.25 SENSITIVE Sensitive     GENTAMICIN <=1 SENSITIVE Sensitive     IMIPENEM 1 SENSITIVE Sensitive     TRIMETH/SULFA <=20 SENSITIVE Sensitive     PIP/TAZO 16 SENSITIVE Sensitive     * ABUNDANT ENTEROBACTER SPECIES         Radiology Studies: Ir Exchange Biliary Drain  Result Date: 10/28/2016 INDICATION: Common bile duct obstruction, obstructive jaundice  EXAM: IR EXCHANGE BILARY DRAIN; IR ENDOLUMINAL BIOPSY OF BILIARY TREE MEDICATIONS: Patient is already receiving antibiotics as an inpatient. ANESTHESIA/SEDATION: Moderate (conscious) sedation was employed during this procedure. A total of Versed 2.0 mg and Fentanyl 100 mcg was administered intravenously. Moderate Sedation Time: 12 minutes. The patient's level of consciousness and vital signs were monitored continuously by radiology nursing throughout the procedure under my direct supervision. FLUOROSCOPY TIME:  Fluoroscopy Time: 4 minutes 0 seconds (23 mGy). COMPLICATIONS: None immediate. PROCEDURE: Informed written consent was obtained from the patient after a thorough discussion of the procedural risks, benefits and alternatives. All questions were addressed. Maximal Sterile Barrier Technique was utilized including caps, mask, sterile gowns, sterile gloves, sterile drape, hand hygiene and skin antiseptic. A timeout was performed prior to the initiation of the procedure. Under sterile conditions and local anesthesia, the existing right internal external 10 [place] biliary drain was removed over an Amplatz guidewire. 8 French sheath inserted into the common bile duct across the CBD obstruction. Endoluminal brush biopsy performed of the CBD obstruction 3 times. Samples placed in saline. Over the guidewire, a 10 [place] internal external biliary drain was replaced. Retention loop formed in the duodenum. Contrast injection confirms position. There is adequate drainage of the biliary tree. CBD obstruction persist. IMPRESSION: Successful endoluminal brush biopsy of the common bile duct obstruction. Replacement of the 10 [place] internal external right biliary drain catheter. Electronically Signed   By: Jerilynn Mages.  Shick M.D.   On: 10/28/2016 14:51   Ir Endoluminal Bx Of Biliary Tree  Result Date: 10/28/2016 INDICATION: Common bile duct obstruction, obstructive jaundice EXAM: IR EXCHANGE BILARY DRAIN; IR ENDOLUMINAL BIOPSY OF  BILIARY TREE MEDICATIONS: Patient is already receiving antibiotics as an inpatient. ANESTHESIA/SEDATION: Moderate (conscious) sedation was employed during this procedure. A total of Versed 2.0 mg and Fentanyl 100 mcg was administered intravenously. Moderate Sedation Time: 12 minutes. The patient's level of consciousness and vital signs were monitored continuously by radiology nursing throughout the procedure under my direct supervision. FLUOROSCOPY TIME:  Fluoroscopy Time: 4 minutes 0 seconds (23 mGy). COMPLICATIONS: None immediate. PROCEDURE: Informed written consent was obtained from the patient after a thorough discussion of the procedural risks, benefits and alternatives. All questions were addressed. Maximal Sterile Barrier Technique was utilized including caps, mask, sterile gowns, sterile gloves, sterile drape, hand hygiene and skin antiseptic. A timeout was performed prior to the initiation of the procedure. Under sterile conditions and local anesthesia, the existing right internal external 10 [place] biliary drain was removed over an Amplatz  guidewire. 8 French sheath inserted into the common bile duct across the CBD obstruction. Endoluminal brush biopsy performed of the CBD obstruction 3 times. Samples placed in saline. Over the guidewire, a 10 [place] internal external biliary drain was replaced. Retention loop formed in the duodenum. Contrast injection confirms position. There is adequate drainage of the biliary tree. CBD obstruction persist. IMPRESSION: Successful endoluminal brush biopsy of the common bile duct obstruction. Replacement of the 10 [place] internal external right biliary drain catheter. Electronically Signed   By: Jerilynn Mages.  Shick M.D.   On: 10/28/2016 14:51        Scheduled Meds: . ceFEPime (MAXIPIME) IV  2 g Intravenous Q24H  . dextromethorphan-guaiFENesin  1 tablet Oral BID  . pantoprazole  40 mg Oral Q0600  . polyethylene glycol  17 g Oral Daily   Continuous Infusions:   LOS: 7  days    Dron Tanna Furry, MD [facility] Pager [phone]  If 7PM-7AM, please contact night-coverage [email or web address] Password [email or web address] 10/29/2016, 1:46 PM

## 2016-10-29 NOTE — Progress Notes (Signed)
Physical Therapy Treatment Patient Details Name: Jimmy Myers MRN: [id number] DOB: 1932-08-01 Today's Date: 10/29/2016    History of Present Illness Pt is an 83 y/o male admitted from home secondary to worsening jaundice (?carcinoma). ERCP performed on 10/24/16 with another endoscopy scheduled for 2/24. PMH including but not limited to a-fib, CHF, HTN and lymphoma.     PT Comments    Progressing slowly with gait stability and tolerance.  Likely should use an AD at all times.  The use of the cane is safe in a homelike environment, but pt will still have potential for falls.   Follow Up Recommendations  Home health PT;Supervision/Assistance - 24 hour     Equipment Recommendations  None recommended by PT;Other (comment) (TBA)    Recommendations for Other Services       Precautions / Restrictions Precautions Precautions: Fall    Mobility  Bed Mobility Overal bed mobility: Modified Independent Bed Mobility: Supine to Sit;Sit to Supine     Supine to sit: Modified independent (Device/Increase time) Sit to supine: Modified independent (Device/Increase time)   General bed mobility comments: increased time  Transfers Overall transfer level: Needs assistance Equipment used: Rolling walker (2 wheeled) Transfers: Sit to/from Stand Sit to Stand: Min guard            Ambulation/Gait Ambulation/Gait assistance: Min guard Ambulation Distance (Feet): 380 Feet Assistive device: Straight cane Gait Pattern/deviations: Step-through pattern;Decreased step length - right;Decreased stance time - left;Decreased stride length Gait velocity: decreased Gait velocity interpretation: Below normal speed for age/gender General Gait Details: generally steady with safe use of the cane, but otherwise rigid and slow cadence.   Stairs            Wheelchair Mobility    Modified Rankin (Stroke Patients Only)       Balance     Sitting balance-Leahy Scale: Good       Standing  balance-Leahy Scale: Fair                      Cognition Arousal/Alertness: Awake/alert Behavior During Therapy: WFL for tasks assessed/performed Overall Cognitive Status: Within Functional Limits for tasks assessed                      Exercises      General Comments        Pertinent Vitals/Pain Pain Assessment: Faces Faces Pain Scale: Hurts even more Pain Location: R side of abdomen Pain Descriptors / Indicators: Aching;Guarding;Sore Pain Intervention(s): Monitored during session    Home Living                      Prior Function            PT Goals (current goals can now be found in the care plan section) Acute Rehab PT Goals Patient Stated Goal: return home PT Goal Formulation: With patient/family Time For Goal Achievement: 11/08/16 Potential to Achieve Goals: Good Progress towards PT goals: Progressing toward goals    Frequency    Min 3X/week      PT Plan Current plan remains appropriate    Co-evaluation             End of Session   Activity Tolerance: Patient tolerated treatment well Patient left: in bed;with call bell/phone within reach Nurse Communication: Mobility status PT Visit Diagnosis: Unsteadiness on feet (R26.81);Other abnormalities of gait and mobility (R26.89)     Time: [id number] PT Time Calculation (min) (ACUTE  ONLY): 19 min  Charges:  $Gait Training: 8-22 mins                    G CodesTessie Fass Montae Stager 10/29/2016, 5:32 PM 10/29/2016  Donnella Sham, PT [phone]  (pager)

## 2016-10-29 NOTE — Progress Notes (Signed)
Patient ID: Jimmy Myers, male   DOB: 1932/05/24, 83 y.o.   MRN: [id number]    Referring Physician(s): Dr. Wilfrid Lund  Supervising Physician: Markus Daft  Patient Status: [facility] - In-pt  Chief Complaint: Obstructive jaundice  Subjective: Patient with no new complaints.  Allergies: Codeine; Simvastatin; and Vicodin [hydrocodone-acetaminophen]  Medications: Prior to Admission medications   Medication Sig Start Date End Date Taking? Authorizing Provider  albuterol (PROVENTIL HFA;VENTOLIN HFA) 108 (90 BASE) MCG/ACT inhaler Inhale 2 puffs into the lungs every 6 (six) hours as needed for wheezing or shortness of breath.   Yes Historical Provider, MD  clorazepate (TRANXENE) 3.75 MG tablet Take 1.875-3.75 mg by mouth daily as needed for anxiety.    Yes Historical Provider, MD  Emollient (EUCERIN) lotion Apply 3-5 mLs topically daily.    Yes Historical Provider, MD  fluticasone (FLONASE) 50 MCG/ACT nasal spray Place 1 spray into both nostrils daily as needed for allergies or rhinitis.   Yes Historical Provider, MD  folic acid (FOLVITE) 818 MCG tablet Take 800 mcg by mouth daily.   Yes Historical Provider, MD  furosemide (LASIX) 40 MG tablet TAKE ONE TABLET BY MOUTH ONCE DAILY 09/26/16  Yes Burtis Junes, NP  guaiFENesin (MUCINEX) 600 MG 12 hr tablet Take 600 mg by mouth 2 (two) times daily as needed.   Yes Historical Provider, MD  lisinopril (PRINIVIL,ZESTRIL) 5 MG tablet TAKE ONE TABLET BY MOUTH ONCE DAILY Patient taking differently: TAKE ONE TABLET BY MOUTH ONCE DAILY AT 5PM 06/27/16  Yes Burtis Junes, NP  loperamide (IMODIUM A-D) 2 MG tablet Take 2 mg by mouth 4 (four) times daily as needed for diarrhea or loose stools.   Yes Historical Provider, MD  nitroGLYCERIN (NITROSTAT) 0.4 MG SL tablet Place 1 tablet (0.4 mg total) under the tongue every 5 (five) minutes x 3 doses as needed for chest pain. 05/15/14 10/06/17 Yes Burtis Junes, NP  omeprazole (PRILOSEC) 20 MG capsule Take 20 mg by mouth  daily before breakfast.    Yes Historical Provider, MD  phytonadione (VITAMIN K) 5 MG tablet Take 5 mg by mouth once.   Yes Historical Provider, MD  polyethylene glycol (MIRALAX / GLYCOLAX) packet Take 17 g by mouth at bedtime as needed.    Yes Historical Provider, MD  potassium chloride SA (K-DUR,KLOR-CON) 20 MEQ tablet Take 20 mEq by mouth 2 (two) times daily.   Yes Historical Provider, MD  warfarin (COUMADIN) 5 MG tablet Take as directed by coumadin clinic Patient taking differently: Take 2.5-5 mg by mouth See admin instructions. Take 2.'5mg'$  on MONDAYS AND FRIDAYS, take '5mg'$ s daily on all other days of the week 07/13/13  Yes Thompson Grayer, MD  cefdinir (OMNICEF) 300 MG capsule Take 300 mg by mouth 2 (two) times daily. 10/09/16   Historical Provider, MD  oseltamivir (TAMIFLU) 75 MG capsule Take 75 mg by mouth 2 (two) times daily. 10/09/16   Historical Provider, MD    Vital Signs: BP 110/66 (BP Location: Left Arm)   Pulse 77   Temp 97.9 F (36.6 C) (Oral)   Resp 17   Ht '5\' 10"'$  (1.778 m)   Wt 191 lb 3.2 oz (86.7 kg)   SpO2 98%   BMI 27.43 kg/m   Physical Exam: Abd: soft, PTC drain in place with cloudy bilious output, drain site is c/d/i.  Imaging: Ir Exchange Biliary Drain  Result Date: 10/28/2016 INDICATION: Common bile duct obstruction, obstructive jaundice EXAM: IR EXCHANGE BILARY DRAIN; IR ENDOLUMINAL BIOPSY OF  BILIARY TREE MEDICATIONS: Patient is already receiving antibiotics as an inpatient. ANESTHESIA/SEDATION: Moderate (conscious) sedation was employed during this procedure. A total of Versed 2.0 mg and Fentanyl 100 mcg was administered intravenously. Moderate Sedation Time: 12 minutes. The patient's level of consciousness and vital signs were monitored continuously by radiology nursing throughout the procedure under my direct supervision. FLUOROSCOPY TIME:  Fluoroscopy Time: 4 minutes 0 seconds (23 mGy). COMPLICATIONS: None immediate. PROCEDURE: Informed written consent was obtained  from the patient after a thorough discussion of the procedural risks, benefits and alternatives. All questions were addressed. Maximal Sterile Barrier Technique was utilized including caps, mask, sterile gowns, sterile gloves, sterile drape, hand hygiene and skin antiseptic. A timeout was performed prior to the initiation of the procedure. Under sterile conditions and local anesthesia, the existing right internal external 10 [place] biliary drain was removed over an Amplatz guidewire. 8 French sheath inserted into the common bile duct across the CBD obstruction. Endoluminal brush biopsy performed of the CBD obstruction 3 times. Samples placed in saline. Over the guidewire, a 10 [place] internal external biliary drain was replaced. Retention loop formed in the duodenum. Contrast injection confirms position. There is adequate drainage of the biliary tree. CBD obstruction persist. IMPRESSION: Successful endoluminal brush biopsy of the common bile duct obstruction. Replacement of the 10 [place] internal external right biliary drain catheter. Electronically Signed   By: Jerilynn Mages.  Shick M.D.   On: 10/28/2016 14:51   Ir Endoluminal Bx Of Biliary Tree  Result Date: 10/28/2016 INDICATION: Common bile duct obstruction, obstructive jaundice EXAM: IR EXCHANGE BILARY DRAIN; IR ENDOLUMINAL BIOPSY OF BILIARY TREE MEDICATIONS: Patient is already receiving antibiotics as an inpatient. ANESTHESIA/SEDATION: Moderate (conscious) sedation was employed during this procedure. A total of Versed 2.0 mg and Fentanyl 100 mcg was administered intravenously. Moderate Sedation Time: 12 minutes. The patient's level of consciousness and vital signs were monitored continuously by radiology nursing throughout the procedure under my direct supervision. FLUOROSCOPY TIME:  Fluoroscopy Time: 4 minutes 0 seconds (23 mGy). COMPLICATIONS: None immediate. PROCEDURE: Informed written consent was obtained from the patient after a thorough discussion of the  procedural risks, benefits and alternatives. All questions were addressed. Maximal Sterile Barrier Technique was utilized including caps, mask, sterile gowns, sterile gloves, sterile drape, hand hygiene and skin antiseptic. A timeout was performed prior to the initiation of the procedure. Under sterile conditions and local anesthesia, the existing right internal external 10 [place] biliary drain was removed over an Amplatz guidewire. 8 French sheath inserted into the common bile duct across the CBD obstruction. Endoluminal brush biopsy performed of the CBD obstruction 3 times. Samples placed in saline. Over the guidewire, a 10 [place] internal external biliary drain was replaced. Retention loop formed in the duodenum. Contrast injection confirms position. There is adequate drainage of the biliary tree. CBD obstruction persist. IMPRESSION: Successful endoluminal brush biopsy of the common bile duct obstruction. Replacement of the 10 [place] internal external right biliary drain catheter. Electronically Signed   By: Jerilynn Mages.  Shick M.D.   On: 10/28/2016 14:51    Labs:  CBC:  Recent Labs  10/23/16 0607 10/24/16 0731 10/25/16 0520 10/27/16 0831  WBC 6.6 6.9 7.1 7.8  HGB 10.4* 9.7* 10.1* 10.8*  HCT 31.9* 29.1* 30.3* 33.2*  PLT 121* 104* 63* 106*    COAGS:  Recent Labs  10/23/16 0607 10/24/16 0731 10/25/16 0520 10/26/16 0703  INR 4.62* 1.25 1.01 0.96    BMP:  Recent Labs  10/25/16 0520 10/26/16 0703 10/27/16 0831 10/29/16 0350  NA 139 141 141 139  K 4.2 3.9 3.7 3.5  CL 107 113* 108 108  CO2 '23 24 26 24  '$ GLUCOSE 172* 96 128* 103*  BUN 27* 27* 24* 27*  CALCIUM 8.3* 8.2* 8.5* 8.4*  CREATININE 1.18 1.26* 1.12 1.11  GFRNONAA 56* 51* 59* 60*  GFRAA >60 60* >60 >60    LIVER FUNCTION TESTS:  Recent Labs  10/25/16 0520 10/26/16 0703 10/27/16 0831 10/29/16 0514  BILITOT 14.9* 8.0* 9.4* 12.0*  AST 86* 56* 58* 56*  ALT 94* 81* 78* 71*  ALKPHOS 323* 282* 272* 207*  PROT 5.0* 5.1*  5.6* 5.4*  ALBUMIN 2.0* 2.1* 2.3* 2.1*    Assessment and Plan: 1. S/p PTC drain placement and subsequent brushings -TB increased some today.  Patient had brushings and drain eval yesterday and everything appeared to be working well. -given cloudy output and increasing TB, would not recommend capping the drain yet. -we will follow along  Electronically Signed: Isabeau Mccalla E 10/29/2016, 11:19 AM   I spent a total of 15 Minutes at the the patient's bedside AND on the patient's hospital floor or unit, greater than 50% of which was counseling/coordinating care for obstructive jaundice

## 2016-10-30 DIAGNOSIS — R7989 Other specified abnormal findings of blood chemistry: Secondary | ICD-10-CM

## 2016-10-30 DIAGNOSIS — K8309 Other cholangitis: Secondary | ICD-10-CM

## 2016-10-30 DIAGNOSIS — K830 Cholangitis: Secondary | ICD-10-CM

## 2016-10-30 DIAGNOSIS — Z862 Personal history of diseases of the blood and blood-forming organs and certain disorders involving the immune mechanism: Secondary | ICD-10-CM

## 2016-10-30 DIAGNOSIS — C221 Intrahepatic bile duct carcinoma: Secondary | ICD-10-CM

## 2016-10-30 LAB — COMPREHENSIVE METABOLIC PANEL
ALBUMIN: 2.2 g/dL — AB (ref 3.5–5.0)
ALT: 69 U/L — ABNORMAL HIGH (ref 17–63)
ANION GAP: 5 (ref 5–15)
AST: 56 U/L — AB (ref 15–41)
Alkaline Phosphatase: 212 U/L — ABNORMAL HIGH (ref 38–126)
BUN: 29 mg/dL — AB (ref 6–20)
CHLORIDE: 110 mmol/L (ref 101–111)
CO2: 24 mmol/L (ref 22–32)
Calcium: 8.4 mg/dL — ABNORMAL LOW (ref 8.9–10.3)
Creatinine, Ser: 1.16 mg/dL (ref 0.61–1.24)
GFR calc Af Amer: >60 mL/min (ref >60)
GFR, EST NON AFRICAN AMERICAN: 57 mL/min — AB (ref >60)
GLUCOSE: 104 mg/dL — AB (ref 65–99)
POTASSIUM: 4 mmol/L (ref 3.5–5.1)
Sodium: 139 mmol/L (ref 135–145)
TOTAL PROTEIN: 5.4 g/dL — AB (ref 6.5–8.1)
Total Bilirubin: 11.1 mg/dL — ABNORMAL HIGH (ref 0.3–1.2)

## 2016-10-30 LAB — CBC
HEMATOCRIT: 33.3 % — AB (ref 39.0–52.0)
HEMOGLOBIN: 11.2 g/dL — AB (ref 13.0–17.0)
MCH: 32.7 pg (ref 26.0–34.0)
MCHC: 33.6 g/dL (ref 30.0–36.0)
MCV: 97.4 fL (ref 78.0–100.0)
Platelets: 135 10*3/uL — ABNORMAL LOW (ref 150–400)
RBC: 3.42 MIL/uL — ABNORMAL LOW (ref 4.22–5.81)
RDW: 16.4 % — AB (ref 11.5–15.5)
WBC: 7.2 10*3/uL (ref 4.0–10.5)

## 2016-10-30 LAB — AEROBIC/ANAEROBIC CULTURE W GRAM STAIN (SURGICAL/DEEP WOUND)

## 2016-10-30 LAB — LACTATE DEHYDROGENASE: LDH: 163 U/L (ref 98–192)

## 2016-10-30 LAB — AEROBIC/ANAEROBIC CULTURE (SURGICAL/DEEP WOUND)

## 2016-10-30 NOTE — Progress Notes (Signed)
PTC drain in place.  Still with 1375cc out yesterday.  TB is stable.  Given appearance of bile and TB stability and currently not down trending, we will leave the drain externalized as well for discharge home.  We will have him return to clinic in 1-2 weeks with a repeat CMET at that time as well.  He will get a drain injection and if this appears to be patent and his TB is improving, then we can consider capping his drain at that time.  He will need to flush his drain with 5-10cc daily of normal saline.  He will also need to document and record how much output he is having daily from his drain.  Our office will contact him for his follow up appointment.  All of this information has been placed in his discharge instruction section.  Chrysta Fulcher E 11:28 AM 10/30/2016

## 2016-10-30 NOTE — Clinical Social Work Note (Signed)
CSW received consult for SNF placement, however patient has chosen to discharge home. Home health services have  Been arranged by case manager. CSW signing off, however please re-consult if any other SW services needed prior to discharge.  Aynslee Mulhall Givens, MSW, LCSW Licensed Clinical Social Worker [place] [phone]

## 2016-10-30 NOTE — Progress Notes (Signed)
I spoke with Dr. Irene Limbo this morning He will see the patient today from oncology perspective IR to manage drain, which hopefully eventually can be internalized GI will sign off for now, but remain available.  Please call with questions

## 2016-10-30 NOTE — NC FL2 (Signed)
[place] MEDICAID FL2 LEVEL OF CARE SCREENING TOOL     IDENTIFICATION  Patient Name: Jimmy Myers Birthdate: [birth year] Sex: male Admission Date (Current Location): 10/22/2016  [facility] and [place] Number:  [employer] and Address:  [facility] [place], [place] [address] [place], [place] [address]      Provider Number: [id number]  Attending Physician Name and Address:  Rosita Fire, MD  Relative Name and Phone Number:  Stphen Kruszynski - Spouse - [phone]    Current Level of Care: Hospital Recommended Level of Care: [place] Prior Approval Number:    Date Approved/Denied:   PASRR Number: [id number]  Discharge Plan: SNF    Current Diagnoses: Patient Active Problem List   Diagnosis Date Noted  . Cholangitis   . Obstructive jaundice   . Hyperbilirubinemia   . Common bile duct stricture   . Jaundice 10/22/2016  . Supratherapeutic INR 10/22/2016  . GERD (gastroesophageal reflux disease) 10/22/2016  . Anxiety 10/22/2016  . Cough 10/09/2016  . Thyroid goiter 02/26/2012  . Aortic root enlargement ([place]) 01/12/2012  . Long term (current) use of anticoagulants 01/06/2012  . Thrombus of left atrial appendage 01/06/2012  . Acute Systolic dysfunction/cardiomyopathy due to prolonged tachycardia/EF 25% 12/30/2011  . Thrombocytopenia ([place]) 12/30/2011  . Other dysphagia 12/30/2011  . Chronic systolic heart failure ([place]) 12/28/2011  . Atrial fibrillation 12/28/2011  . HTN (hypertension), benign 12/28/2011    Orientation RESPIRATION BLADDER Height & Weight     Self, Time, Situation, Place  Normal Continent Weight: 83.1 kg (183 lb 3.2 oz) Height:  5\' 10"  (177.8 cm)  BEHAVIORAL SYMPTOMS/MOOD NEUROLOGICAL BOWEL NUTRITION STATUS      Continent (Biliary Tube (Cook slip-coat) placed 10/25/2016) Diet (Regular)  AMBULATORY STATUS COMMUNICATION OF NEEDS Skin   Limited Assist Verbally Other (Comment) (Jaundice/Dry/Ecchymosis on right and  left arms)                       Personal Care Assistance Level of Assistance  Bathing, Feeding, Dressing Bathing Assistance: Limited assistance Feeding assistance: Independent (Needs set up) Dressing Assistance: Limited assistance     Functional Limitations Info  Sight, Hearing, Speech Sight Info: Impaired (Wears glasses) Hearing Info: Adequate Speech Info: Adequate    SPECIAL CARE FACTORS FREQUENCY  PT (By licensed PT), OT (By licensed OT)     PT Frequency: PT Eval completed on 10/25/2016 with recommendations of 3x/week minimum OT Frequency: OT Eval completed on 10/26/2016 with recommendations of 2x/week minimum            Contractures Contractures Info: Not present    Additional Factors Info  Code Status, Allergies Code Status Info: Full Code Allergies Info: Codeine, Simvastatin, Vicodin Hydrocodone-acetaminophen           Current Medications (10/30/2016):  This is the current hospital active medication list Current Facility-Administered Medications  Medication Dose Route Frequency Provider Last Rate Last Dose  . albuterol (PROVENTIL) (2.5 MG/3ML) 0.083% nebulizer solution 2.5 mg  2.5 mg Nebulization Q4H PRN Dron Tanna Furry, MD      . ceFEPIme (MAXIPIME) 2 g in dextrose 5 % 50 mL IVPB  2 g Intravenous Q24H Nelida Meuse III, MD   2 g at 10/30/16 1300  . dextromethorphan-guaiFENesin (MUCINEX DM) 30-600 MG per 12 hr tablet 1 tablet  1 tablet Oral BID Dron Tanna Furry, MD   1 tablet at 10/30/16 0904  . LORazepam (ATIVAN) injection 0.5 mg  0.5 mg Intravenous  Q30 min PRN Reubin Milan, MD   0.5 mg at 10/23/16 1018  . ondansetron (ZOFRAN) tablet 4 mg  4 mg Oral Q6H PRN Reubin Milan, MD       Or  . ondansetron [facility]) injection 4 mg  4 mg Intravenous Q6H PRN Reubin Milan, MD      . oxyCODONE (Oxy IR/ROXICODONE) immediate release tablet 5 mg  5 mg Oral Q4H PRN Dron Tanna Furry, MD   5 mg at 10/29/16 2330  . pantoprazole (PROTONIX) EC tablet  40 mg  40 mg Oral Q0600 Vena Rua, PA-C   40 mg at 10/30/16 [id number]  . polyethylene glycol (MIRALAX / GLYCOLAX) packet 17 g  17 g Oral Daily Dron Tanna Furry, MD   17 g at 10/30/16 [id number]     Discharge Medications: Please see discharge summary for a list of discharge medications.  Relevant Imaging Results:  Relevant Lab Results:   Additional Information SSN:  [id number]  Lajoyce Lauber Work [phone]

## 2016-10-30 NOTE — Progress Notes (Signed)
Occupational Therapy Treatment Patient Details Name: Jimmy Myers MRN: [id number] DOB: 08-23-1932 Today's Date: 10/30/2016    History of present illness Pt is an 83 y/o male admitted from home secondary to worsening jaundice (?carcinoma). ERCP performed on 10/24/16 with another endoscopy scheduled for 2/24. PMH including but not limited to a-fib, CHF, HTN and lymphoma.    OT comments  Pt performs grooming and functional mobility with Min guard A and RW. Pt demonstrates dynamic standing balance to urinate at toilet and wash hands at sink with Min guard. Pt still relies on RW for balance and endurance during functional mobility. Will continue to follow acutely to facilitate safe dc. Recommend dc home with HHOT and 24 hour supervision for fall risk.    Follow Up Recommendations  Home health OT;Supervision/Assistance - 24 hour;Other (comment) (Pt verbalized concern and reconsidering SNF)    Equipment Recommendations  3 in 1 bedside commode    Recommendations for Other Services      Precautions / Restrictions Precautions Precautions: Fall Restrictions Weight Bearing Restrictions: No       Mobility Bed Mobility               General bed mobility comments: In recliner upon arrival  Transfers Overall transfer level: Needs assistance Equipment used: Rolling walker (2 wheeled) Transfers: Sit to/from Stand Sit to Stand: Min guard              Balance Overall balance assessment: Needs assistance Sitting-balance support: Feet supported;No upper extremity supported Sitting balance-Leahy Scale: Good     Standing balance support: Bilateral upper extremity supported;During functional activity Standing balance-Leahy Scale: Fair Standing balance comment: dynamic standing at toilet and at sink for ADLs with min guard.                    ADL Overall ADL's : Needs assistance/impaired     Grooming: Wash/dry face;Min guard;Standing                                Functional mobility during ADLs: Min guard;Rolling walker General ADL Comments: Pt performs toileting and maintains standing balance to urinate at toilet with Min guard; pt performs ADLs and fucntional mobility with Min guard and RW      Vision                     Perception     Praxis      Cognition   Behavior During Therapy: WFL for tasks assessed/performed Overall Cognitive Status: Within Functional Limits for tasks assessed                  General Comments: Pt tends to perseverate on topics      Exercises     Shoulder Instructions       General Comments      Pertinent Vitals/ Pain       Pain Assessment: 0-10 Pain Score: 8  Pain Location: R side of abdomen Pain Descriptors / Indicators: Aching;Guarding;Sore Pain Intervention(s): Monitored during session  Home Living                                          Prior Functioning/Environment              Frequency  Min 2X/week  Progress Toward Goals  OT Goals(current goals can now be found in the care plan section)  Progress towards OT goals: Progressing toward goals  Acute Rehab OT Goals Patient Stated Goal: return home OT Goal Formulation: With patient Time For Goal Achievement: 11/09/16 Potential to Achieve Goals: Good ADL Goals Pt Will Perform Grooming: with modified independence;standing Pt Will Perform Upper Body Bathing: with modified independence;sitting Pt Will Perform Lower Body Bathing: with modified independence;sit to/from stand Pt Will Transfer to Toilet: with modified independence;ambulating;bedside commode Pt Will Perform Toileting - Clothing Manipulation and hygiene: with modified independence;sit to/from stand Pt Will Perform Tub/Shower Transfer: Tub transfer;with supervision;ambulating;3 in 1;rolling walker  Plan Discharge plan remains appropriate    Co-evaluation                 End of Session Equipment Utilized During  Treatment: Gait belt;Rolling walker  OT Visit Diagnosis: Unsteadiness on feet (R26.81);Muscle weakness (generalized) (M62.81)   Activity Tolerance Patient tolerated treatment well   Patient Left in chair;with call bell/phone within reach   Nurse Communication Mobility status        Time: 1450-1503 OT Time Calculation (min): 13 min  Charges: OT General Charges $OT Visit: 1 Procedure OT Treatments $Self Care/Home Management : 8-22 mins  [place], OTR/L [place] 10/30/2016, 3:16 PM

## 2016-10-30 NOTE — Progress Notes (Signed)
Patient has decided for ST rehab.  CSW Intern has initiated facility search process.  Assess to follow.   Linward Headland, [place] Intern Social Work

## 2016-10-30 NOTE — Progress Notes (Signed)
PROGRESS NOTE    Jimmy Myers  [id number] DOB: 24-Dec-1931 DOA: 10/22/2016 PCP: Kenn File, MD   Brief Narrative: 83 y.o.malewith medical history significant of chronic atrial fibrillation, anxiety, aortic root enlargement, osteoarthritis, history of atrial thrombus, lymphoma, chronic systolic congestive heart failure, diverticulosis, GERD, hemorrhoids, hypertension, history of pneumonia, recently treated for influenza who is being referred to the emergency department by his primary care provider due to progressively worsening jaundice. CT scan abdomen/pelvis showed moderate intrahepatic biliary ductal dilation, mild distention of the gallbladder, distention of the CBD suspicious for CBD or pancreatic carcinoma. ERCP unsuccessful. External biliary drainage was placed by Ir.  Assessment & Plan:  #  Obstructive jaundice worrisome for cholangiocarcinoma versus pancreatic cancer: ? Acute cholangitis: -Status post external drain was placed by interventional radiologist. CA 19-9 significantly elevated.  -the tissue brushing cytology c/w adenocarcinoma. I consulted oncologist and discussed with Dr. Irene Limbo this morning. Patient will be seen today. Patient is currently on cefepime for biliary fluid growing Enterobacter which is sensitive.  -Total bilirubin level stable today. -I discussed with Claiborne Billings from IR regarding the plan for drainage. Patient likely needs to go home with the drain and follow-up with IR as an outpatient. -Continue supportive care including Zofran as needed, Protonix orally -Continue oxycodone prn for pain management at the site of the drain.  -Liver enzymes are trending down, bilirubin level 11.1 Continue to monitor labs.  #Chronic systolic CHF: Stable now. Echo in May 2017 showed EF of 40-45%.  #Atrial fibrillation, exact type unknown: Cardiology consult appreciated. Holding coumadin until patient will be re-evaluated by cardiologist as an outpatient. -Patient had  supratherapeutic INR on admission likely due to liver disease. Currently INR is normal. Coumadin on hold. -I discussed with the patient regarding holding Coumadin at bedside. He agreed with the plan.  #Hypertension: Blood pressure acceptable today. Lisinopril on hold now. Continue to monitor.   #History of acid reflux: On Protonix  #Thrombocytopenia: Monitor platelet count. No sign of bleeding. Last plt 106 on 2/26. Repeat CBC today.  Principal Problem:   Jaundice Active Problems:   Chronic systolic heart failure (HCC)   Atrial fibrillation   HTN (hypertension), benign   Supratherapeutic INR   GERD (gastroesophageal reflux disease)   Anxiety   Common bile duct stricture   Hyperbilirubinemia   Obstructive jaundice  DVT prophylaxis: SCD. No anticoagulation because of thrombocytopenia Code Status: Full code Family Communication: No family present at bedside. Disposition Plan: Likely discharge home in 1-2 days depending on GI and IR's plan   Consultants:   GI  Cardiologist  IR  Procedures:  ERCP, external drain placement by IR Antimicrobials: Unasyn from 2/23-2/24. Cefepime from 2/24--   Subjective: Patient was seen and examined at bedside. No new event. Denied nausea vomiting abdominal pain, chest pain or shortness of breath.  Objective: Vitals:   10/29/16 1711 10/29/16 2152 10/30/16 0428 10/30/16 0911  BP: 109/65 (!) 113/55 115/68 (!) 109/47  Pulse: 69 72 72 82  Resp: 18 18 19 18   Temp: 98.3 F (36.8 C) 98.1 F (36.7 C) 98.4 F (36.9 C) 97.4 F (36.3 C)  TempSrc: Oral Oral Oral Oral  SpO2: 98% 95% 96% 96%  Weight:  83.1 kg (183 lb 3.2 oz)    Height:        Intake/Output Summary (Last 24 hours) at 10/30/16 1107 Last data filed at 10/30/16 1016  Gross per 24 hour  Intake             1205 ml  Output             1375 ml  Net             -170 ml   Filed Weights   10/27/16 2113 10/28/16 2234 10/29/16 2152  Weight: 89.4 kg (197 lb 1.6 oz) 86.7 kg (191 lb  3.2 oz) 83.1 kg (183 lb 3.2 oz)    Examination:  General exam:Not in distress, lying in bed comfortable Eye: + Icterus,  no significant change Respiratory system: Clear bilateral, no wheezing. Respiratory effort normal.  Cardiovascular system: S1 & S2 heard, RRR.  No pedal edema. Gastrointestinal system: Abdomen soft, nontender. Bowel sound positive. External biliary drain present. No bleeding at the site. Central nervous system: Alert and oriented. No focal neurological deficits. Extremities: Symmetric 5 x 5 power. Skin: Generalized icteric skin Psychiatry: Judgement and insight appear normal. Mood & affect appropriate.     Data Reviewed: I have personally reviewed following labs and imaging studies  CBC:  Recent Labs Lab 10/24/16 0731 10/25/16 0520 10/27/16 0831  WBC 6.9 7.1 7.8  NEUTROABS 4.7 6.0  --   HGB 9.7* 10.1* 10.8*  HCT 29.1* 30.3* 33.2*  MCV 93.9 94.4 97.1  PLT 104* 63* [id number]*   Basic Metabolic Panel:  Recent Labs Lab 10/25/16 0520 10/26/16 0703 10/26/16 1811 10/27/16 0831 10/29/16 0514 10/30/16 0451  NA 139 141  --  141 139 139  K 4.2 3.9  --  3.7 3.5 4.0  CL 107 113*  --  108 108 110  CO2 23 24  --  26 24 24   GLUCOSE 172* 96  --  128* 103* 104*  BUN 27* 27*  --  24* 27* 29*  CREATININE 1.18 1.26*  --  1.12 1.11 1.16  CALCIUM 8.3* 8.2*  --  8.5* 8.4* 8.4*  MG  --   --  1.9  --   --   --    GFR: Estimated Creatinine Clearance: 50.7 mL/min (by C-G formula based on SCr of 1.16 mg/dL). Liver Function Tests:  Recent Labs Lab 10/25/16 0520 10/26/16 0703 10/27/16 0831 10/29/16 0514 10/30/16 0451  AST 86* 56* 58* 56* 56*  ALT 94* 81* 78* 71* 69*  ALKPHOS 323* 282* 272* 207* 212*  BILITOT 14.9* 8.0* 9.4* 12.0* 11.1*  PROT 5.0* 5.1* 5.6* 5.4* 5.4*  ALBUMIN 2.0* 2.1* 2.3* 2.1* 2.2*   No results for input(s): LIPASE, AMYLASE in the last 168 hours. No results for input(s): AMMONIA in the last 168 hours. Coagulation Profile:  Recent Labs Lab  10/24/16 0731 10/25/16 0520 10/26/16 0703  INR 1.25 1.01 0.96   Cardiac Enzymes: No results for input(s): CKTOTAL, CKMB, CKMBINDEX, TROPONINI in the last 168 hours. BNP (last 3 results) No results for input(s): PROBNP in the last 8760 hours. HbA1C: No results for input(s): HGBA1C in the last 72 hours. CBG: No results for input(s): GLUCAP in the last 168 hours. Lipid Profile: No results for input(s): CHOL, HDL, LDLCALC, TRIG, CHOLHDL, LDLDIRECT in the last 72 hours. Thyroid Function Tests: No results for input(s): TSH, T4TOTAL, FREET4, T3FREE, THYROIDAB in the last 72 hours. Anemia Panel: No results for input(s): VITAMINB12, FOLATE, FERRITIN, TIBC, IRON, RETICCTPCT in the last 72 hours. Sepsis Labs: No results for input(s): PROCALCITON, LATICACIDVEN in the last 168 hours.  Recent Results (from the past 240 hour(s))  Aerobic/Anaerobic Culture (surgical/deep wound)     Status: None   Collection Time: 10/25/16 10:49 AM  Result Value Ref Range Status   Specimen Description  BILE  Final   Special Requests NONE  Final   Gram Stain   Final    FEW WBC PRESENT, PREDOMINANTLY PMN RARE GRAM NEGATIVE RODS    Culture   Final    ABUNDANT ENTEROBACTER SPECIES NO ANAEROBES ISOLATED; CULTURE IN PROGRESS FOR 5 DAYS    Report Status 10/30/2016 FINAL  Final   Organism ID, Bacteria ENTEROBACTER SPECIES  Final      Susceptibility   Enterobacter species - MIC*    CEFAZOLIN >=64 RESISTANT Resistant     CEFEPIME <=1 SENSITIVE Sensitive     CEFTAZIDIME <=1 SENSITIVE Sensitive     CEFTRIAXONE <=1 SENSITIVE Sensitive     CIPROFLOXACIN <=0.25 SENSITIVE Sensitive     GENTAMICIN <=1 SENSITIVE Sensitive     IMIPENEM 1 SENSITIVE Sensitive     TRIMETH/SULFA <=20 SENSITIVE Sensitive     PIP/TAZO 16 SENSITIVE Sensitive     * ABUNDANT ENTEROBACTER SPECIES         Radiology Studies: Ir Exchange Biliary Drain  Result Date: 10/28/2016 INDICATION: Common bile duct obstruction, obstructive jaundice  EXAM: IR EXCHANGE BILARY DRAIN; IR ENDOLUMINAL BIOPSY OF BILIARY TREE MEDICATIONS: Patient is already receiving antibiotics as an inpatient. ANESTHESIA/SEDATION: Moderate (conscious) sedation was employed during this procedure. A total of Versed 2.0 mg and Fentanyl 100 mcg was administered intravenously. Moderate Sedation Time: 12 minutes. The patient's level of consciousness and vital signs were monitored continuously by radiology nursing throughout the procedure under my direct supervision. FLUOROSCOPY TIME:  Fluoroscopy Time: 4 minutes 0 seconds (23 mGy). COMPLICATIONS: None immediate. PROCEDURE: Informed written consent was obtained from the patient after a thorough discussion of the procedural risks, benefits and alternatives. All questions were addressed. Maximal Sterile Barrier Technique was utilized including caps, mask, sterile gowns, sterile gloves, sterile drape, hand hygiene and skin antiseptic. A timeout was performed prior to the initiation of the procedure. Under sterile conditions and local anesthesia, the existing right internal external 10 [place] biliary drain was removed over an Amplatz guidewire. 8 French sheath inserted into the common bile duct across the CBD obstruction. Endoluminal brush biopsy performed of the CBD obstruction 3 times. Samples placed in saline. Over the guidewire, a 10 [place] internal external biliary drain was replaced. Retention loop formed in the duodenum. Contrast injection confirms position. There is adequate drainage of the biliary tree. CBD obstruction persist. IMPRESSION: Successful endoluminal brush biopsy of the common bile duct obstruction. Replacement of the 10 [place] internal external right biliary drain catheter. Electronically Signed   By: Jerilynn Mages.  Shick M.D.   On: 10/28/2016 14:51   Ir Endoluminal Bx Of Biliary Tree  Result Date: 10/28/2016 INDICATION: Common bile duct obstruction, obstructive jaundice EXAM: IR EXCHANGE BILARY DRAIN; IR ENDOLUMINAL BIOPSY OF  BILIARY TREE MEDICATIONS: Patient is already receiving antibiotics as an inpatient. ANESTHESIA/SEDATION: Moderate (conscious) sedation was employed during this procedure. A total of Versed 2.0 mg and Fentanyl 100 mcg was administered intravenously. Moderate Sedation Time: 12 minutes. The patient's level of consciousness and vital signs were monitored continuously by radiology nursing throughout the procedure under my direct supervision. FLUOROSCOPY TIME:  Fluoroscopy Time: 4 minutes 0 seconds (23 mGy). COMPLICATIONS: None immediate. PROCEDURE: Informed written consent was obtained from the patient after a thorough discussion of the procedural risks, benefits and alternatives. All questions were addressed. Maximal Sterile Barrier Technique was utilized including caps, mask, sterile gowns, sterile gloves, sterile drape, hand hygiene and skin antiseptic. A timeout was performed prior to the initiation of the procedure. Under sterile conditions  and local anesthesia, the existing right internal external 10 [place] biliary drain was removed over an Amplatz guidewire. 8 French sheath inserted into the common bile duct across the CBD obstruction. Endoluminal brush biopsy performed of the CBD obstruction 3 times. Samples placed in saline. Over the guidewire, a 10 [place] internal external biliary drain was replaced. Retention loop formed in the duodenum. Contrast injection confirms position. There is adequate drainage of the biliary tree. CBD obstruction persist. IMPRESSION: Successful endoluminal brush biopsy of the common bile duct obstruction. Replacement of the 10 [place] internal external right biliary drain catheter. Electronically Signed   By: Jerilynn Mages.  Shick M.D.   On: 10/28/2016 14:51        Scheduled Meds: . ceFEPime (MAXIPIME) IV  2 g Intravenous Q24H  . dextromethorphan-guaiFENesin  1 tablet Oral BID  . pantoprazole  40 mg Oral Q0600  . polyethylene glycol  17 g Oral Daily   Continuous Infusions:   LOS: 8  days    Dron Tanna Furry, MD [facility] Pager [phone]  If 7PM-7AM, please contact night-coverage [email or web address] Password [email or web address] 10/30/2016, 11:07 AM

## 2016-10-30 NOTE — Consult Note (Signed)
Marland Kitchen    HEMATOLOGY/ONCOLOGY CONSULTATION NOTE  Date of Service: 10/30/2016  Patient Care Team: [name], MD as PCP - General (Family Medicine) Thompson Grayer, MD as Attending Physician (Cardiology) Donita Brooks, MD as Consulting Physician (Internal Medicine) [name], MD as Attending Physician (Family Medicine)  CHIEF COMPLAINTS/PURPOSE OF CONSULTATION:  Newly diagnosed pancreatico-biliary adenocarcinoma  HISTORY OF PRESENTING ILLNESS:   Jimmy Myers is a wonderful 83 y.o. male who has been referred to [place] by Dr Carolin Sicks for evaluation and management of newly diagnosed pancreaticobiliary adenocarcinoma.  Patient has a history of chronic atrial fibrillation, aortic root enlargement, osteoarthritis, history of atrial thrombus, chronic systolic heart failure, diverticulosis, GERD, hemorrhoids, hypertension, ?lymphoma with recent treatment for influenza.  He was referred to the emergency room by his primary care physician for progressively worsening jaundice with weight loss of about 5 pounds in the last week or so. No chest pain or palpitations no dizziness. No abdominal pain nausea vomiting constipation or overt GI bleeding. No hematuria or dysuria.  In the emergency room the patient was noted to supratherapeutic INR, hemoglobin of 12 hyponatremia 130, alkaline phosphatase of 562, AST of 113 ALT 144 and a total bilirubin of 8.5.  Patient subsequently had a workup with abdominal imaging including a CT of the abdomen pelvis followed by an MRI MRCP which showed Abrupt cut off of the common bile duct at or immediately beyond the junction of the common hepatic duct and cystic duct with moderate proximal biliary dilatation. The exact source of obstruction is unclear on today's examination, with differential considerations including an obstructing ductal stone (which is simply not visualized secondary to technical limitations on today's examination), ductal adenocarcinoma, or  obstructing pancreatic head lesion (which is not visualized on today's examination secondary to technical limitations). Further evaluation with ERCP is recommended.  Patient had an ERCP on 10/24/2016 which showed a severe biliary structure in the mid/distal CBD.  Patient has subsequently had a internal external biliary drain placed to relieve the biliary obstruction in the mid CBD. On 10/28/2016 the biliary drain was exchanged and endoluminal brush biopsy of the common bile duct obstruction was obtained.  Brush biopsies consistent with adenocarcinoma. We were consulted for further recommendations regarding workup and treatment of patient's newly diagnosed pancreatico-biliary adenocarcinoma. Labs showed an elevated CA-19-9 level of 1581. CEA level is pending. Patient was noted to be growing Enterobacter from his biliary fluid and is currently on antibiotics.  Patient notes that he has 1 progressively weaker since November and may have lost 5-7 pounds. Prior to this he was functioning fairly independently. Notes no significant abdominal pain at this time. Has not noticed of his stools were pale in color. Did notice that his urine looked fairly orange likely due to hyperbilirubinemia. No chest pain or shortness of breath. No other acute new symptoms.  Patient reports that he has previously had history of Waldenstrom's macroglobulinemia/lymphoplasmacytic lymphoma that was treated in 2016 by Dr. Elyn Aquas at the [facility] in [place]. He was treated with Rituxan and some additional chemotherapy. He notes that he had his last follow-up with his oncologist in December 2017. He is unsure if he would like to followup here at [place] center or with his [place] oncologist but noted that he will likely find it easier to f/u in [place], [place] (lives in [place], [place]).   MEDICAL HISTORY:  Past Medical History:  Diagnosis Date  . A-fib [facility]) May 2013   s/p TEE/cardioversion June 2013  . Anxiety   .  Aortic root  enlargement (HCC)    at least 5cm by CT  . Arthritis   . Atrial thrombus   . Bradycardia   . Bronchitis   . Cancer ([place]) 2016   lymphoma  . Chronic anticoagulation    on coumadin - checked in [place]  . Congestive heart failure (CHF) ([place]) 2013  . Diverticulosis   . Dysrhythmia    AFib  . GERD (gastroesophageal reflux disease)   . Hemorrhoids   . HTN (hypertension)   . Pneumonia May 2013  . Pneumonia 2013  . 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 Thrombocytopenia ([place])     SURGICAL HISTORY: Past Surgical History:  Procedure Laterality Date  . CARDIOVERSION  12/30/2011   Procedure: CARDIOVERSION;  Surgeon: Lelon Perla, MD;  Location: [facility] ENDOSCOPY;  Service: Cardiovascular;  Laterality: N/A;  . CARDIOVERSION  02/16/2012   Procedure: CARDIOVERSION;  Surgeon: Lelon Perla, MD;  Location: [facility] ENDOSCOPY;  Service: Cardiovascular;  Laterality: N/A;  . CATARACT EXTRACTION W/PHACO Left 07/28/2016   Procedure: CATARACT EXTRACTION PHACO AND INTRAOCULAR LENS PLACEMENT LEFT EYE:  CDE: 13.00;  Surgeon: Tonny Branch, MD;  Location: [facility] ORS;  Service: Ophthalmology;  Laterality: Left;  . ENDOSCOPIC RETROGRADE CHOLANGIOPANCREATOGRAPHY (ERCP) WITH PROPOFOL N/A 10/24/2016   Procedure: ENDOSCOPIC RETROGRADE CHOLANGIOPANCREATOGRAPHY (ERCP) WITH PROPOFOL;  Surgeon: Doran Stabler, MD;  Location: [facility] ENDOSCOPY;  Service: Endoscopy;  Laterality: N/A;  . ESOPHAGOGASTRODUODENOSCOPY  12/30/2011   Procedure: ESOPHAGOGASTRODUODENOSCOPY (EGD);  Surgeon: Lelon Perla, MD;  Location: [facility] ENDOSCOPY;  Service: Cardiovascular;  Laterality: N/A;  . ESOPHAGOGASTRODUODENOSCOPY  12/30/2011   Procedure: ESOPHAGOGASTRODUODENOSCOPY (EGD);  Surgeon: Lafayette Dragon, MD;  Location: [facility] ENDOSCOPY;  Service: Endoscopy;  Laterality: N/A;  . IR GENERIC HISTORICAL  10/25/2016   IR INT EXT  BILIARY DRAIN WITH CHOLANGIOGRAM 10/25/2016 Greggory Keen, MD [facility]-INTERV RAD  . IR GENERIC HISTORICAL  10/28/2016   IR EXCHANGE BILIARY DRAIN 10/28/2016 Greggory Keen, MD [facility]-INTER[facility] RAD  . IR GENERIC HISTORICAL  10/28/2016   IR ENDOLUMINAL BX OF BILIARY TREE 10/28/2016 Greggory Keen, MD [facility]-INTER[facility] RAD  . KNEE ARTHROSCOPY  rt knee  . PROSTATE SURGERY    . TEE WITHOUT CARDIOVERSION  12/30/2011   Procedure: TRANSESOPHAGEAL ECHOCARDIOGRAM (TEE);  Surgeon: Lelon Perla, MD;  Location: [facility] ENDOSCOPY;  Service: Cardiovascular;  Laterality: N/A;  . TEE WITHOUT CARDIOVERSION  02/16/2012   Procedure: TRANSESOPHAGEAL ECHOCARDIOGRAM (TEE);  Surgeon: Lelon Perla, MD;  Location: [facility] ENDOSCOPY;  Service: Cardiovascular;  Laterality: N/A;    SOCIAL HISTORY: Social History   Social History  . Marital status: Married    Spouse name: N/A  . Number of children: 2  . Years of education: N/A   Occupational History  . retired Retired   Social History Main Topics  . Smoking status: Never Smoker  . Smokeless tobacco: Never Used  . Alcohol use No  . Drug use: No  . Sexual activity: Not Currently    Birth control/ protection: None   Other Topics Concern  . Not on file   Social History Narrative  . No narrative on file    FAMILY HISTORY: Family History  Problem Relation Age of Onset  . Heart disease Mother   . Heart disease Father   . Stomach cancer Brother   . Heart disease Sister   . Heart disease  Brother   . Hypertension Sister     ALLERGIES:  is allergic to codeine; simvastatin; and vicodin [hydrocodone-acetaminophen].  MEDICATIONS:  Current Facility-Administered Medications  Medication Dose Route Frequency Provider Last Rate Last Dose  . albuterol (PROVENTIL) (2.5 MG/3ML) 0.083% nebulizer solution 2.5 mg  2.5 mg Nebulization Q4H PRN Dron Tanna Furry, MD      . ceFEPIme (MAXIPIME) 2 g in dextrose 5 % 50 mL IVPB  2 g Intravenous Q24H Nelida Meuse III, MD   2 g at 10/29/16 1413  .  dextromethorphan-guaiFENesin (MUCINEX DM) 30-600 MG per 12 hr tablet 1 tablet  1 tablet Oral BID Dron Tanna Furry, MD   1 tablet at 10/30/16 0904  . LORazepam (ATIVAN) injection 0.5 mg  0.5 mg Intravenous Q30 min PRN Reubin Milan, MD   0.5 mg at 10/23/16 1018  . ondansetron (ZOFRAN) tablet 4 mg  4 mg Oral Q6H PRN Reubin Milan, MD       Or  . ondansetron [facility]) injection 4 mg  4 mg Intravenous Q6H PRN Reubin Milan, MD      . oxyCODONE (Oxy IR/ROXICODONE) immediate release tablet 5 mg  5 mg Oral Q4H PRN Dron Tanna Furry, MD   5 mg at 10/29/16 2330  . pantoprazole (PROTONIX) EC tablet 40 mg  40 mg Oral Q0600 Vena Rua, PA-C   40 mg at 10/30/16 [id number]  . polyethylene glycol (MIRALAX / GLYCOLAX) packet 17 g  17 g Oral Daily Dron Tanna Furry, MD   17 g at 10/30/16 [id number]    REVIEW OF SYSTEMS:    10 Point review of Systems was done is negative except as noted above.  PHYSICAL EXAMINATION: ECOG PERFORMANCE STATUS: 2 - Symptomatic, <50% confined to bed  . Vitals:   10/30/16 0428 10/30/16 0911  BP: 115/68 (!) 109/47  Pulse: 72 82  Resp: 19 18  Temp: 98.4 F (36.9 C) 97.4 F (36.3 C)   Filed Weights   10/27/16 2113 10/28/16 2234 10/29/16 2152  Weight: 197 lb 1.6 oz (89.4 kg) 191 lb 3.2 oz (86.7 kg) 183 lb 3.2 oz (83.1 kg)   .Body mass index is 26.29 kg/m.  GENERAL:alert, in no acute distress and comfortable SKIN: skin color, texture, turgor are normal, no rashes or significant lesions EYES: normal, conjunctiva are pink and non-injected, sclera clear OROPHARYNX:no exudate, no erythema and lips, buccal mucosa, and tongue normal  NECK: supple, no JVD, thyroid normal size, non-tender, without nodularity LYMPH:  no palpable lymphadenopathy in the cervical, axillary or inguinal LUNGS: clear to auscultation with normal respiratory effort HEART: regular rate & rhythm,  no murmurs and no lower extremity edema ABDOMEN: abdomen soft, non-tender, normoactive bowel  sounds  Musculoskeletal: no cyanosis of digits and no clubbing  PSYCH: alert & oriented x 3 with fluent speech NEURO: no focal motor/sensory deficits  LABORATORY DATA:  I have reviewed the data as listed  . CBC Latest Ref Rng & Units 10/27/2016 10/25/2016 10/24/2016  WBC 4.0 - 10.5 K/uL 7.8 7.1 6.9  Hemoglobin 13.0 - 17.0 g/dL 10.8(L) 10.1(L) 9.7(L)  Hematocrit 39.0 - 52.0 % 33.2(L) 30.3(L) 29.1(L)  Platelets 150 - 400 K/uL 106(L) 63(L) 104(L)    . CMP Latest Ref Rng & Units 10/30/2016 10/29/2016 10/27/2016  Glucose 65 - 99 mg/dL 104(H) 103(H) 128(H)  BUN 6 - 20 mg/dL 29(H) 27(H) 24(H)  Creatinine 0.61 - 1.24 mg/dL 1.16 1.11 1.12  Sodium 135 - 145 mmol/L 139 139 141  Potassium 3.5 -  5.1 mmol/L 4.0 3.5 3.7  Chloride 101 - 111 mmol/L 110 108 108  CO2 22 - 32 mmol/L 24 24 26   Calcium 8.9 - 10.3 mg/dL 8.4(L) 8.4(L) 8.5(L)  Total Protein 6.5 - 8.1 g/dL 5.4(L) 5.4(L) 5.6(L)  Total Bilirubin 0.3 - 1.2 mg/dL 11.1(H) 12.0(H) 9.4(H)  Alkaline Phos 38 - 126 U/L 212(H) 207(H) 272(H)  AST 15 - 41 U/L 56(H) 56(H) 58(H)  ALT 17 - 63 U/L 69(H) 71(H) 78(H)     ERCP 10/24/2016 Impression: - A severe biliary stricture was found RADIOGRAPHIC STUDIES: I have personally reviewed the radiological images as listed and agreed with the findings in the report. Ct Abdomen Pelvis W Contrast  Result Date: 10/22/2016 CLINICAL DATA:  83 y/o  M; elevated liver enzymes and jaundice. EXAM: CT ABDOMEN AND PELVIS WITH CONTRAST TECHNIQUE: Multidetector CT imaging of the abdomen and pelvis was performed using the standard protocol following bolus administration of intravenous contrast. CONTRAST:  135mL ISOVUE-300 IOPAMIDOL (ISOVUE-300) INJECTION 61% COMPARISON:  08/16/2012 CT of the abdomen and pelvis. FINDINGS: Lower chest: Moderate cardiomegaly. Aortic valvular calcification. No acute abnormality. Hepatobiliary: Moderate intrahepatic biliary ductal dilatation, mild distention of the gallbladder, a distention of the  common bile duct up to 19 mm (series 5, image 69). The common bile duct is abruptly cut off as it enters the head of the pancreas with soft tissue filling the lumen. Two small gallstones within the gallbladder. Pancreas: No main duct dilatation of the pancreas. No inflammatory change. Spleen: Normal in size without focal abnormality. Adrenals/Urinary Tract: Lucencies in the kidneys the largest arising exophytic from the upper pole of right kidney measuring 41 mm, likely representing cysts. Normal adrenal glands. No urinary stone disease or obstructive uropathy. Normal bladder. Stomach/Bowel: Stomach is within normal limits. Appendix appears normal. No evidence of bowel wall thickening, distention, or inflammatory changes. Vascular/Lymphatic: Aortic atherosclerosis. No enlarged abdominal or pelvic lymph nodes. Reproductive: Mild prostate hypertrophy and calcifications. Other: No ascites. Musculoskeletal: No acute osseous abnormality. Multilevel degenerative changes of the spine with discogenic and prominent lower lumbar facet arthropathy. IMPRESSION: 1. Moderate intrahepatic biliary ductal dilatation, mild distention of the gallbladder, a distention of the common bile duct up to 19 mm. The common bile duct is abruptly cut off as it enters the head of the pancreas with soft tissue filling the lumen. 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 MRI/MRCP is recommended with and without intravenous contrast for further evaluation. 2. Moderate cardiomegaly. 3. Cholelithiasis. 4. Aortic atherosclerosis. 5. Prostate hypertrophy. Electronically Signed   By: Kristine Garbe M.D.   On: 10/22/2016 22:29   Mr 3d Recon At Scanner  Result Date: 10/23/2016 CLINICAL DATA:  83 year old male with history of progressively worsening jaundice. No associated fever, abdominal pain, nausea, emesis, constipation, melena or hematochezia. Biliary tract ductal dilatation noted on recent CT  examination. EXAM: MRI ABDOMEN WITHOUT AND WITH CONTRAST (INCLUDING MRCP) TECHNIQUE: Multiplanar multisequence MR imaging of the abdomen was performed both before and after the administration of intravenous contrast. Heavily T2-weighted images of the biliary and pancreatic ducts were obtained, and three-dimensional MRCP images were rendered by post processing. CONTRAST:  24mL MULTIHANCE GADOBENATE DIMEGLUMINE 529 MG/ML IV SOLN COMPARISON:  No priors.  CT of the abdomen and pelvis 10/22/2016. FINDINGS: Lower chest: Cardiomegaly. Hepatobiliary: No discrete cystic or solid hepatic lesions. MRCP images demonstrate moderate intra and extrahepatic biliary ductal dilatation which abruptly terminates at or just beyond the junction of the common hepatic and cystic ducts in  the proximal common bile duct. The common hepatic duct measures up to 14 mm in diameter. The area of obstruction on MRCP images is poorly demonstrated secondary to central loss of signal intensity on MRCP images. Unfortunately, this region is also poorly evaluated on postcontrast imaging secondary to patient motion. No discrete mass is identified in this region. Gallbladder is moderately distended. Several tiny filling defects are noted in the gallbladder, compatible with tiny gallstones. Gallbladder wall does not appear thickened and there is no pericholecystic fluid or surrounding inflammatory changes to suggest an acute cholecystitis at this time. Pancreas: No discrete pancreatic mass identified in the pancreatic head to account for the common bile duct obstruction. There are some tiny T1 hypointense, T2 hyperintense, nonenhancing lesions in the distal body and tail of the pancreas, measuring up to 1 cm in diameter, presumably tiny pancreatic pseudocysts. No pancreatic ductal dilatation noted on MRCP images. No peripancreatic inflammatory changes. Spleen:  Unremarkable. Adrenals/Urinary Tract: Well-defined T1 hypointense, T2 hyperintense, nonenhancing  lesions are noted in both kidneys, the largest of which is exophytic extending from the upper pole of the right kidney measuring up to 5.4 cm in diameter. No hydroureteronephrosis in the visualized portions of the abdomen. Bilateral adrenal glands are normal in appearance. Stomach/Bowel: Visualized portions are unremarkable. Vascular/Lymphatic: Aortic atherosclerosis, without definite aneurysm in the abdominal vasculature. No lymphadenopathy noted in the abdomen. Other: No significant volume of ascites noted in the visualized portions of the peritoneal cavity. Musculoskeletal: No aggressive osseous lesions are noted in the visualized portions of the skeleton. IMPRESSION: 1. Abrupt cut off of the common bile duct at or immediately beyond the junction of the common hepatic duct and cystic duct with moderate proximal biliary dilatation. The exact source of obstruction is unclear on today's examination, with differential considerations including an obstructing ductal stone (which is simply not visualized secondary to technical limitations on today's examination), ductal adenocarcinoma, or obstructing pancreatic head lesion (which is not visualized on today's examination secondary to technical limitations). Further evaluation with ERCP is recommended if clinically appropriate. 2. Cholelithiasis without evidence of acute cholecystitis at this time. Electronically Signed   By: Vinnie Langton M.D.   On: 10/23/2016 14:37   Dg C-arm 1-60 Min-no Report  Result Date: 10/24/2016 Fluoroscopy was utilized by the requesting physician.  No radiographic interpretation.   Mr Abdomen Mrcp Moise Boring Contast  Result Date: 10/23/2016 CLINICAL DATA:  83 year old male with history of progressively worsening jaundice. No associated fever, abdominal pain, nausea, emesis, constipation, melena or hematochezia. Biliary tract ductal dilatation noted on recent CT examination. EXAM: MRI ABDOMEN WITHOUT AND WITH CONTRAST (INCLUDING MRCP)  TECHNIQUE: Multiplanar multisequence MR imaging of the abdomen was performed both before and after the administration of intravenous contrast. Heavily T2-weighted images of the biliary and pancreatic ducts were obtained, and three-dimensional MRCP images were rendered by post processing. CONTRAST:  97mL MULTIHANCE GADOBENATE DIMEGLUMINE 529 MG/ML IV SOLN COMPARISON:  No priors.  CT of the abdomen and pelvis 10/22/2016. FINDINGS: Lower chest: Cardiomegaly. Hepatobiliary: No discrete cystic or solid hepatic lesions. MRCP images demonstrate moderate intra and extrahepatic biliary ductal dilatation which abruptly terminates at or just beyond the junction of the common hepatic and cystic ducts in the proximal common bile duct. The common hepatic duct measures up to 14 mm in diameter. The area of obstruction on MRCP images is poorly demonstrated secondary to central loss of signal intensity on MRCP images. Unfortunately, this region is also poorly evaluated on postcontrast imaging secondary to patient motion.  No discrete mass is identified in this region. Gallbladder is moderately distended. Several tiny filling defects are noted in the gallbladder, compatible with tiny gallstones. Gallbladder wall does not appear thickened and there is no pericholecystic fluid or surrounding inflammatory changes to suggest an acute cholecystitis at this time. Pancreas: No discrete pancreatic mass identified in the pancreatic head to account for the common bile duct obstruction. There are some tiny T1 hypointense, T2 hyperintense, nonenhancing lesions in the distal body and tail of the pancreas, measuring up to 1 cm in diameter, presumably tiny pancreatic pseudocysts. No pancreatic ductal dilatation noted on MRCP images. No peripancreatic inflammatory changes. Spleen:  Unremarkable. Adrenals/Urinary Tract: Well-defined T1 hypointense, T2 hyperintense, nonenhancing lesions are noted in both kidneys, the largest of which is exophytic  extending from the upper pole of the right kidney measuring up to 5.4 cm in diameter. No hydroureteronephrosis in the visualized portions of the abdomen. Bilateral adrenal glands are normal in appearance. Stomach/Bowel: Visualized portions are unremarkable. Vascular/Lymphatic: Aortic atherosclerosis, without definite aneurysm in the abdominal vasculature. No lymphadenopathy noted in the abdomen. Other: No significant volume of ascites noted in the visualized portions of the peritoneal cavity. Musculoskeletal: No aggressive osseous lesions are noted in the visualized portions of the skeleton. IMPRESSION: 1. Abrupt cut off of the common bile duct at or immediately beyond the junction of the common hepatic duct and cystic duct with moderate proximal biliary dilatation. The exact source of obstruction is unclear on today's examination, with differential considerations including an obstructing ductal stone (which is simply not visualized secondary to technical limitations on today's examination), ductal adenocarcinoma, or obstructing pancreatic head lesion (which is not visualized on today's examination secondary to technical limitations). Further evaluation with ERCP is recommended if clinically appropriate. 2. Cholelithiasis without evidence of acute cholecystitis at this time. Electronically Signed   By: Vinnie Langton M.D.   On: 10/23/2016 14:37   Ir Int Lianne Cure Biliary Drain With Cholangiogram  Result Date: 10/25/2016 INDICATION: Obstructive jaundice, distal common bile duct obstruction concerning for malignant obstruction. Failed ERCP. EXAM: IR INT-EXT BILIARY DRAIN W/ CHOLANGIOGRAM MEDICATIONS: Unasyn 3 g; The antibiotic was administered within an appropriate time frame prior to the initiation of the procedure. ANESTHESIA/SEDATION: Moderate (conscious) sedation was employed during this procedure. A total of Versed 1.0 mg and Fentanyl 37.5 mcg was administered intravenously. Moderate Sedation Time: 20 minutes.  The patient's level of consciousness and vital signs were monitored continuously by radiology nursing throughout the procedure under my direct supervision. FLUOROSCOPY TIME:  Fluoroscopy Time: 2 minutes 30 seconds (11 mGy). COMPLICATIONS: None immediate. PROCEDURE: Informed written consent was obtained from the patient after a thorough discussion of the procedural risks, benefits and alternatives. All questions were addressed. Maximal Sterile Barrier Technique was utilized including caps, mask, sterile gowns, sterile gloves, sterile drape, hand hygiene and skin antiseptic. A timeout was performed prior to the initiation of the procedure. Previous imaging reviewed. Preliminary ultrasound performed. Dilated peripheral right hepatic ducts demonstrated. Overlying skin marked. Under sterile conditions and local anesthesia, ultrasound percutaneous needle access performed of a peripheral right hepatic duct. There was return of bile. Contrast injection performed for initial cholangiogram. Diffuse intrahepatic biliary dilatation evident. 018 guidewire advanced centrally followed by the Accustick dilator set. Contrast injection demonstrates diffuse biliary dilatation with a severe stricture/ obstruction of the mid CBD. Below the stricture, the bile duct is nondilated and drains into the duodenum. Catheter and guidewire access were manipulated through the common bile duct severe stricture. Catheter was advanced into  the duodenum. Contrast injection confirms position in the duodenum. Amplatz guidewire exchange performed. 19 [place] internal external biliary drain and successfully advanced over the guidewire. Retention loop formed in the duodenum. Contrast injection confirms adequate drainage of the biliary tree. Filling defects within the biliary tree at the conclusion the procedure compatible with blood related to the intervention but no active bleeding appreciated. Catheter secured with a Prolene suture and connected to  external gravity drainage bag. Sterile dressing applied. Patient tolerated the procedure well. No immediate complication. IMPRESSION: Successful ultrasound and fluoroscopic 10 French right internal external biliary drain to relieve the biliary obstruction secondary to the mid CBD severe stricture / obstruction. Again, this is concerning for a malignant process. Electronically Signed   By: Jerilynn Mages.  Shick M.D.   On: 10/25/2016 11:24   Ir Exchange Biliary Drain  Result Date: 10/28/2016 INDICATION: Common bile duct obstruction, obstructive jaundice EXAM: IR EXCHANGE BILARY DRAIN; IR ENDOLUMINAL BIOPSY OF BILIARY TREE MEDICATIONS: Patient is already receiving antibiotics as an inpatient. ANESTHESIA/SEDATION: Moderate (conscious) sedation was employed during this procedure. A total of Versed 2.0 mg and Fentanyl 100 mcg was administered intravenously. Moderate Sedation Time: 12 minutes. The patient's level of consciousness and vital signs were monitored continuously by radiology nursing throughout the procedure under my direct supervision. FLUOROSCOPY TIME:  Fluoroscopy Time: 4 minutes 0 seconds (23 mGy). COMPLICATIONS: None immediate. PROCEDURE: Informed written consent was obtained from the patient after a thorough discussion of the procedural risks, benefits and alternatives. All questions were addressed. Maximal Sterile Barrier Technique was utilized including caps, mask, sterile gowns, sterile gloves, sterile drape, hand hygiene and skin antiseptic. A timeout was performed prior to the initiation of the procedure. Under sterile conditions and local anesthesia, the existing right internal external 10 [place] biliary drain was removed over an Amplatz guidewire. 8 French sheath inserted into the common bile duct across the CBD obstruction. Endoluminal brush biopsy performed of the CBD obstruction 3 times. Samples placed in saline. Over the guidewire, a 10 [place] internal external biliary drain was replaced. Retention loop  formed in the duodenum. Contrast injection confirms position. There is adequate drainage of the biliary tree. CBD obstruction persist. IMPRESSION: Successful endoluminal brush biopsy of the common bile duct obstruction. Replacement of the 10 [place] internal external right biliary drain catheter. Electronically Signed   By: Jerilynn Mages.  Shick M.D.   On: 10/28/2016 14:51   Ir Endoluminal Bx Of Biliary Tree  Result Date: 10/28/2016 INDICATION: Common bile duct obstruction, obstructive jaundice EXAM: IR EXCHANGE BILARY DRAIN; IR ENDOLUMINAL BIOPSY OF BILIARY TREE MEDICATIONS: Patient is already receiving antibiotics as an inpatient. ANESTHESIA/SEDATION: Moderate (conscious) sedation was employed during this procedure. A total of Versed 2.0 mg and Fentanyl 100 mcg was administered intravenously. Moderate Sedation Time: 12 minutes. The patient's level of consciousness and vital signs were monitored continuously by radiology nursing throughout the procedure under my direct supervision. FLUOROSCOPY TIME:  Fluoroscopy Time: 4 minutes 0 seconds (23 mGy). COMPLICATIONS: None immediate. PROCEDURE: Informed written consent was obtained from the patient after a thorough discussion of the procedural risks, benefits and alternatives. All questions were addressed. Maximal Sterile Barrier Technique was utilized including caps, mask, sterile gowns, sterile gloves, sterile drape, hand hygiene and skin antiseptic. A timeout was performed prior to the initiation of the procedure. Under sterile conditions and local anesthesia, the existing right internal external 10 [place] biliary drain was removed over an Amplatz guidewire. 8 French sheath inserted into the common bile duct across the CBD obstruction. Endoluminal brush  biopsy performed of the CBD obstruction 3 times. Samples placed in saline. Over the guidewire, a 10 [place] internal external biliary drain was replaced. Retention loop formed in the duodenum. Contrast injection confirms  position. There is adequate drainage of the biliary tree. CBD obstruction persist. IMPRESSION: Successful endoluminal brush biopsy of the common bile duct obstruction. Replacement of the 10 [place] internal external right biliary drain catheter. Electronically Signed   By: Jerilynn Mages.  Shick M.D.   On: 10/28/2016 14:51    ASSESSMENT & PLAN:   83 year old male with   #1 Newly Diagnosed Extrahepatic Cholangiocarcinoma causing biliary obstruction and possible cholangitis. Patient is status post internal/external drain placement. Cholangiocarcinoma more likely since the obstruction was in the mid CBD and there was no overt pancreatic lesion noted. Elevated CA-19-9 levels can occur in both cholangiocarcinoma and pancreatic cancers #2 abnormal liver function tests likely related to mid CBD malignant obstruction from newly diagnosed cholangiocarcinoma.  Plan -GI and intervention radiology for following to manage biliary obstruction currently with internal/external biliary drain which might eventually be internalized if possible. -Patient is on antibiotics for possible cholangitis as per hospitalist -Would recommend CT chest to complete staging workup. -Would also get a CEA since this can also be elevated with cholangiocarcinoma in addition to CA 19-9 -Patient has multiple medical comorbidities  (this and his age)  would likely make him medically challenging for consideration of any surgical intervention. However would consult surgery to make a determination of whether surgery would even be on the table if this is localized disease. No overt intra-abdominal metastases. No overt liver metastases noted. -If surgery is taken out off the table from his medical fitness or from an unresectability standpoint we can weight in further regarding palliative treatment options regarding mx of unresectable cholangiocarcinoma.  -f/u with Dr Irene Limbo in clinic in 1-2 weeks after discharge from the hospital  #3 h/o Waldenstrom's  macroglobulinemia/LPL -- s/p treatment at [facility] in [place] Dr Elyn Aquas in 2016 --- will need to get outside records regarding this. Plan -will check [facility] , Myeloma panel  #4. Patient Active Problem List   Diagnosis Date Noted  . Cholangitis   . Obstructive jaundice   . Hyperbilirubinemia   . Common bile duct stricture   . Jaundice 10/22/2016  . Supratherapeutic INR 10/22/2016  . GERD (gastroesophageal reflux disease) 10/22/2016  . Anxiety 10/22/2016  . Cough 10/09/2016  . Thyroid goiter 02/26/2012  . Aortic root enlargement ([place]) 01/12/2012  . Long term (current) use of anticoagulants 01/06/2012  . Thrombus of left atrial appendage 01/06/2012  . Acute Systolic dysfunction/cardiomyopathy due to prolonged tachycardia/EF 25% 12/30/2011  . Thrombocytopenia ([place]) 12/30/2011  . Other dysphagia 12/30/2011  . Chronic systolic heart failure ([place]) 12/28/2011  . Atrial fibrillation 12/28/2011  . HTN (hypertension), benign 12/28/2011   - will need to continue f/u with PCP and cardiologist on discharge.  I will inform my scheduler to set him for clinic follouwp in 2 weeks at the [place] center.  All of the patients questions were answered with apparent satisfaction. The patient knows to call the clinic with any problems, questions or concerns.  I spent 60 minutes counseling the patient face to face. The total time spent in the appointment was 80 minutes and more than 50% was on counseling and direct patient cares.    Sullivan Lone MD [place] AAHIVMS [facility] Hematology/Oncology Physician [facility]  (Office):       [phone] (Work cell):  [phone] (Fax):  [phone]  10/30/2016 12:46 PM

## 2016-10-30 NOTE — Discharge Instructions (Signed)
Flush drain with 5-10cc of normal saline daily. Record drain output daily and bring this with you to follow up appointment

## 2016-10-31 ENCOUNTER — Inpatient Hospital Stay (HOSPITAL_COMMUNITY): Payer: PPO

## 2016-10-31 ENCOUNTER — Other Ambulatory Visit: Payer: Self-pay | Admitting: *Deleted

## 2016-10-31 DIAGNOSIS — D696 Thrombocytopenia, unspecified: Secondary | ICD-10-CM

## 2016-10-31 LAB — COMPREHENSIVE METABOLIC PANEL
ALT: 76 U/L — AB (ref 17–63)
AST: 64 U/L — AB (ref 15–41)
Albumin: 2.2 g/dL — ABNORMAL LOW (ref 3.5–5.0)
Alkaline Phosphatase: 205 U/L — ABNORMAL HIGH (ref 38–126)
Anion gap: 8 (ref 5–15)
BUN: 30 mg/dL — ABNORMAL HIGH (ref 6–20)
CHLORIDE: 108 mmol/L (ref 101–111)
CO2: 22 mmol/L (ref 22–32)
CREATININE: 1.17 mg/dL (ref 0.61–1.24)
Calcium: 8.6 mg/dL — ABNORMAL LOW (ref 8.9–10.3)
GFR calc non Af Amer: 56 mL/min — ABNORMAL LOW (ref >60)
Glucose, Bld: 107 mg/dL — ABNORMAL HIGH (ref 65–99)
Potassium: 3.8 mmol/L (ref 3.5–5.1)
SODIUM: 138 mmol/L (ref 135–145)
Total Bilirubin: 10.2 mg/dL — ABNORMAL HIGH (ref 0.3–1.2)
Total Protein: 5.3 g/dL — ABNORMAL LOW (ref 6.5–8.1)

## 2016-10-31 LAB — CEA: CEA: 3.8 ng/mL (ref 0.0–4.7)

## 2016-10-31 LAB — KAPPA/LAMBDA LIGHT CHAINS
KAPPA, LAMDA LIGHT CHAIN RATIO: 0.62 (ref 0.26–1.65)
Kappa free light chain: 10.6 mg/L (ref 3.3–19.4)
LAMDA FREE LIGHT CHAINS: 17.1 mg/L (ref 5.7–26.3)

## 2016-10-31 NOTE — Clinical Social Work Note (Signed)
Clinical Social Work Assessment  Patient Details  Name: Jimmy Myers MRN: [id number] Date of Birth: 08/05/1932  Date of referral:  10/31/16               Reason for consult:  Facility Placement                Permission sought to share information with:  Family Supports Permission granted to share information::  Yes, Verbal Permission Granted  Name::     Delcastillo,Fay   Agency::     Relationship::  Wife  Contact Information:  [phone] (mobile) or [phone] (home)  Housing/Transportation Living arrangements for the past 2 months:  Single Family Home Source of Information:  Patient Patient Interpreter Needed:  None Criminal Activity/Legal Involvement Pertinent to Current Situation/Hospitalization:  No - Comment as needed Significant Relationships:  Adult Children, Spouse Lives with:  Spouse Do you feel safe going back to the place where you live?  No (Patient initially indicated that he would d/c home, but on 3/1 decided that it would be better to d/c to a SNF for ST rehab as his wife cannot care for him) Need for family participation in patient care:  Yes (Comment) (Patient requested his wife be contacted regarding facility responses)  Care giving concerns:  Patient reported that his wife has been ill and will not be able to assist him at home upon discharge.  Social Worker assessment / plan:  CSW talked with patient on Thursday, 3/1 regarding discharge disposition when advised late afternoon that he now was requesting SNF at discharge. CSW talked with patient on 3/1 and he is concerned about going home at discharge as his wife has health issues and won't be able to really help him at home. CSW explained facility search process and also informed him that because PT and OT have recommended home health, his insurance might not authorize a skilled facility for rehab, and patient expressed understanding. Patient requested that his information be sent to [place] and [place]  count[place].  Employment status:  Retired Forensic scientist:  [employer]) PT Recommendations:  Home with [place] / Referral to community resources:  [place] (Patient provided with SNF list for [place] and [place] counties[place])  Patient/Family's Response to care:  No concerns expressed regarding care during hospitalization.  Patient/Family's Understanding of and Emotional Response to Diagnosis, Current Treatment, and Prognosis:  Not discussed.  Emotional Assessment Appearance:  Appears stated age Attitude/Demeanor/Rapport:  Other (Appropriate) Affect (typically observed):  Appropriate, Pleasant Orientation:  Oriented to Self, Oriented to Place, Oriented to  Time, Oriented to Situation Alcohol / Substance use:  Tobacco Use, Alcohol Use, Illicit Drugs (Patient reported that he does not smoke, drink or use illicit drugs) Psych involvement (Current and /or in the community):     Discharge Needs  Concerns to be addressed:  Discharge Planning Concerns Readmission within the last 30 days:  No Current discharge risk:  None Barriers to Discharge:  No Barriers Identified   Sable Feil, LCSW 10/31/2016, 11:38 AM

## 2016-10-31 NOTE — Progress Notes (Signed)
PROGRESS NOTE    Jimmy Myers  [id number] DOB: 25-Nov-1931 DOA: 10/22/2016 PCP: Kenn File, MD   Brief Narrative: 83 y.o.malewith medical history significant of chronic atrial fibrillation, anxiety, aortic root enlargement, osteoarthritis, history of atrial thrombus, lymphoma, chronic systolic congestive heart failure, diverticulosis, GERD, hemorrhoids, hypertension, history of pneumonia, recently treated for influenza who is being referred to the emergency department by his primary care provider due to progressively worsening jaundice. CT scan abdomen/pelvis showed moderate intrahepatic biliary ductal dilation, mild distention of the gallbladder, distention of the CBD suspicious for CBD or pancreatic carcinoma. ERCP unsuccessful. External biliary drainage was placed by Ir. Social worker evaluation ongoing for safe discharge to rehabilitation.   Assessment & Plan:  #  Obstructive jaundice likely cholangiocarcinoma and possible ? Acute cholangitis: -Status post external drain  placed by interventional radiologist. CA 19-9 significantly elevated.  -the tissue brushing cytology c/w adenocarcinoma. Oncology consult was obtained. The consult note is reviewed. I discussed with Dr. Irene Limbo this morning. He recommended CT scan of chest to work up for metastasis and surgery consult to evaluate to see if patient is candidate for any surgical intervention -CT scan of chest ordered. Obtaining surgery consult. -Patient is currently on cefepime for biliary fluid growing Enterobacter which is sensitive. Likely discontinue antibiotics by tomorrow. -I discussed with Claiborne Billings from IR on 3/1 egarding the plan for drainage. Patient likely needs to go home with the drain and follow-up with IR as an outpatient. -Continue supportive care including Zofran as needed, Protonix orally -Continue oxycodone prn for pain management at the site of the drain.  -Liver enzymes are trending down, bilirubin level 10.2 Continue  to monitor labs.  #Chronic systolic CHF: Stable now. Echo in May 2017 showed EF of 40-45%.  #Atrial fibrillation, exact type unknown: Cardiology consult appreciated. Holding coumadin until patient will be re-evaluated by cardiologist as an outpatient. -Patient had supratherapeutic INR on admission likely due to liver disease. Currently INR is normal. Coumadin on hold. -I discussed with the patient regarding holding Coumadin at bedside. He agreed with the plan.  #Hypertension: Blood pressure acceptable today. Lisinopril on hold now. Continue to monitor.   #History of acid reflux: On Protonix  #Thrombocytopenia: Monitor platelet count. No sign of bleeding. Platelet count 135.  Principal Problem:   Jaundice Active Problems:   Chronic systolic heart failure (HCC)   Atrial fibrillation   HTN (hypertension), benign   Supratherapeutic INR   GERD (gastroesophageal reflux disease)   Anxiety   Common bile duct stricture   Hyperbilirubinemia   Obstructive jaundice   Cholangitis   Cholangiocarcinoma (HCC)  DVT prophylaxis: SCD. No anticoagulation because of thrombocytopenia Code Status: Full code Family Communication: No family present at bedside. Disposition Plan: Likely discharge to home in 1-2 days when bed is available at rehabilitation. Discussed with the Education officer, museum. CT chest and surgery consult today.   Consultants:   GI  Cardiologist  IR  Procedures:  ERCP, external drain placement by IR Antimicrobials: Unasyn from 2/23-2/24. Cefepime from 2/24--   Subjective: Patient was seen and examined at bedside. No new event. Denied chest pain, shortness of breath, nausea vomiting or abdominal pain. Patient has good oral intake. Objective: Vitals:   10/30/16 1739 10/30/16 2151 10/31/16 0355 10/31/16 1000  BP: 110/60 (!) 101/57 116/62 (!) 110/57  Pulse: 84 77 72 69  Resp: 18 18 17 20   Temp: 97.9 F (36.6 C) 98.2 F (36.8 C) 98.3 F (36.8 C) 97.9 F (36.6 C)  TempSrc:  Oral Oral  Oral  SpO2: 99% 98% 97% 99%  Weight:      Height:        Intake/Output Summary (Last 24 hours) at 10/31/16 1105 Last data filed at 10/31/16 0900  Gross per 24 hour  Intake             1235 ml  Output             1325 ml  Net              -90 ml   Filed Weights   10/27/16 2113 10/28/16 2234 10/29/16 2152  Weight: 89.4 kg (197 lb 1.6 oz) 86.7 kg (191 lb 3.2 oz) 87.6 kg (193 lb 1.6 oz)    Examination:  General exam:Not in distress, lying in bed comfortable Eye: + Icterus,  No significant change Respiratory system: Clear bilateral, no wheezing. Respiratory effort normal.  Cardiovascular system: S1 & S2 heard, RRR.  No pedal edema. Gastrointestinal system: Abdomen soft, nontender, bowel sound positive. External biliary drain present  Central nervous system: Alert and oriented. No focal neurological deficits. Extremities: Symmetric 5 x 5 power. Skin: Generalized icteric skin Psychiatry: Judgement and insight appear normal. Mood & affect appropriate.     Data Reviewed: I have personally reviewed following labs and imaging studies  CBC:  Recent Labs Lab 10/25/16 0520 10/27/16 0831 10/30/16 1144  WBC 7.1 7.8 7.2  NEUTROABS 6.0  --   --   HGB 10.1* 10.8* 11.2*  HCT 30.3* 33.2* 33.3*  MCV 94.4 97.1 97.4  PLT 63* 106* [id number]*   Basic Metabolic Panel:  Recent Labs Lab 10/26/16 0703 10/26/16 1811 10/27/16 0831 10/29/16 0514 10/30/16 0451 10/31/16 0524  NA 141  --  141 139 139 138  K 3.9  --  3.7 3.5 4.0 3.8  CL 113*  --  108 108 110 108  CO2 24  --  26 24 24 22   GLUCOSE 96  --  128* 103* 104* 107*  BUN 27*  --  24* 27* 29* 30*  CREATININE 1.26*  --  1.12 1.11 1.16 1.17  CALCIUM 8.2*  --  8.5* 8.4* 8.4* 8.6*  MG  --  1.9  --   --   --   --    GFR: Estimated Creatinine Clearance: 54.3 mL/min (by C-G formula based on SCr of 1.17 mg/dL). Liver Function Tests:  Recent Labs Lab 10/26/16 0703 10/27/16 0831 10/29/16 0514 10/30/16 0451 10/31/16 0524    AST 56* 58* 56* 56* 64*  ALT 81* 78* 71* 69* 76*  ALKPHOS 282* 272* 207* 212* 205*  BILITOT 8.0* 9.4* 12.0* 11.1* 10.2*  PROT 5.1* 5.6* 5.4* 5.4* 5.3*  ALBUMIN 2.1* 2.3* 2.1* 2.2* 2.2*   No results for input(s): LIPASE, AMYLASE in the last 168 hours. No results for input(s): AMMONIA in the last 168 hours. Coagulation Profile:  Recent Labs Lab 10/25/16 0520 10/26/16 0703  INR 1.01 0.96   Cardiac Enzymes: No results for input(s): CKTOTAL, CKMB, CKMBINDEX, TROPONINI in the last 168 hours. BNP (last 3 results) No results for input(s): PROBNP in the last 8760 hours. HbA1C: No results for input(s): HGBA1C in the last 72 hours. CBG: No results for input(s): GLUCAP in the last 168 hours. Lipid Profile: No results for input(s): CHOL, HDL, LDLCALC, TRIG, CHOLHDL, LDLDIRECT in the last 72 hours. Thyroid Function Tests: No results for input(s): TSH, T4TOTAL, FREET4, T3FREE, THYROIDAB in the last 72 hours. Anemia Panel: No results for input(s): VITAMINB12, FOLATE, FERRITIN, TIBC, IRON, RETICCTPCT  in the last 72 hours. Sepsis Labs: No results for input(s): PROCALCITON, LATICACIDVEN in the last 168 hours.  Recent Results (from the past 240 hour(s))  Aerobic/Anaerobic Culture (surgical/deep wound)     Status: None   Collection Time: 10/25/16 10:49 AM  Result Value Ref Range Status   Specimen Description BILE  Final   Special Requests NONE  Final   Gram Stain   Final    FEW WBC PRESENT, PREDOMINANTLY PMN RARE GRAM NEGATIVE RODS    Culture   Final    ABUNDANT ENTEROBACTER SPECIES NO ANAEROBES ISOLATED; CULTURE IN PROGRESS FOR 5 DAYS    Report Status 10/30/2016 FINAL  Final   Organism ID, Bacteria ENTEROBACTER SPECIES  Final      Susceptibility   Enterobacter species - MIC*    CEFAZOLIN >=64 RESISTANT Resistant     CEFEPIME <=1 SENSITIVE Sensitive     CEFTAZIDIME <=1 SENSITIVE Sensitive     CEFTRIAXONE <=1 SENSITIVE Sensitive     CIPROFLOXACIN <=0.25 SENSITIVE Sensitive      GENTAMICIN <=1 SENSITIVE Sensitive     IMIPENEM 1 SENSITIVE Sensitive     TRIMETH/SULFA <=20 SENSITIVE Sensitive     PIP/TAZO 16 SENSITIVE Sensitive     * ABUNDANT ENTEROBACTER SPECIES         Radiology Studies: No results found.      Scheduled Meds: . ceFEPime (MAXIPIME) IV  2 g Intravenous Q24H  . dextromethorphan-guaiFENesin  1 tablet Oral BID  . pantoprazole  40 mg Oral Q0600  . polyethylene glycol  17 g Oral Daily   Continuous Infusions:   LOS: 9 days    Berle Fitz Tanna Furry, MD [facility] Pager [phone]  If 7PM-7AM, please contact night-coverage [email or web address] Password [email or web address] 10/31/2016, 11:05 AM

## 2016-10-31 NOTE — Consult Note (Signed)
[facility] Surgery Consult Note  HONDO NANDA 09-Jun-1932  [id number].    Requesting MD: Carolin Sicks Chief Complaint/Reason for Consult: Cholangiocarcinoma  HPI:  Jimmy Myers is an 83yo male admitted to [facility] 10/22/16 with hyperbilirubinemia and jaundice. Patient was sent to the ED by his PCP due to jaundice. Bilirubin was found to be 8.5. Patient states that he has had no abdominal pain. Only complaint was jaundice. Unsure if his stools were oddly colored because he is color blind. Denies nausea, vomiting, constipation, or dysuria. He did have some diarrhea prior to admission, but this has since resolved. He had a normal BM this morning.  Hospital workup: - bilirubin up to 10.2 today - CT scan showed moderate intrahepatic biliary ductal dilation, mild distention of the gallbladder, distention of the CBD suspicious for CBD or pancreatic carcinoma.  - MRCP was then performed and showed an abrupt cut off of the common bile duct at or immediately beyond the junction of the common hepatic duct and cystic duct with moderate proximal biliary dilatation.  - ERCP was attempted 2/23 but unsuccessful due to severe biliary stricture.  - s/p PTC with I/E drain 10/25/16; draining well - CEA 3.8 - CA 19-9 1581 - cytology from brushings c/w adenocarcinoma  PMH significant for Atrial fibrillation, aortic root enlargement, CHF (EF 40-45%), h/o atrial thrombus, GERD, HTN, Lymphoma (followed by Dr. Jiles Crocker at [facility]) Denies any prior h/o abdominal surgery Anticoagulants: on coumadin at home Nonsmoker Lives at home with wife, uses cane PRN for ambulation  ROS: Review of Systems  Constitutional: Negative.   HENT: Negative.   Eyes: Negative.   Respiratory: Negative.   Cardiovascular: Negative.   Gastrointestinal: Positive for diarrhea. Negative for abdominal pain, blood in stool, constipation, heartburn, melena, nausea and vomiting.  Genitourinary: Negative.   Musculoskeletal: Negative.   Skin:        jaundice  Neurological: Negative.    All systems reviewed and otherwise negative except for as above  Family History  Problem Relation Age of Onset  . Heart disease Mother   . Heart disease Father   . Stomach cancer Brother   . Heart disease Sister   . Heart disease Brother   . Hypertension Sister     Past Medical History:  Diagnosis Date  . A-fib [facility]) May 2013   s/p TEE/cardioversion June 2013  . Anxiety   . Aortic root enlargement (HCC)    at least 5cm by CT  . Arthritis   . Atrial thrombus   . Bradycardia   . Bronchitis   . Cancer ([place]) 2016   lymphoma  . Chronic anticoagulation    on coumadin - checked in [place]  . Congestive heart failure (CHF) ([place]) 2013  . Diverticulosis   . Dysrhythmia    AFib  . GERD (gastroesophageal reflux disease)   . Hemorrhoids   . HTN (hypertension)   . Pneumonia May 2013  . Pneumonia 2013  . 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 Thrombocytopenia ([place])     Past Surgical History:  Procedure Laterality Date  . CARDIOVERSION  12/30/2011   Procedure: CARDIOVERSION;  Surgeon: Lelon Perla, MD;  Location: [facility] ENDOSCOPY;  Service: Cardiovascular;  Laterality: N/A;  . CARDIOVERSION  02/16/2012   Procedure: CARDIOVERSION;  Surgeon: Lelon Perla, MD;  Location: [place];  Service: Cardiovascular;  Laterality: N/A;  . CATARACT EXTRACTION W/PHACO Left 07/28/2016   Procedure: CATARACT EXTRACTION PHACO AND INTRAOCULAR LENS PLACEMENT LEFT EYE:  CDE: 13.00;  Surgeon: Tonny Branch, MD;  Location: [facility];  Service: Ophthalmology;  Laterality: Left;  . ENDOSCOPIC RETROGRADE CHOLANGIOPANCREATOGRAPHY (ERCP) WITH PROPOFOL N/A 10/24/2016   Procedure: ENDOSCOPIC RETROGRADE CHOLANGIOPANCREATOGRAPHY (ERCP) WITH PROPOFOL;  Surgeon: Doran Stabler, MD;  Location: [place] ENDOSCOPY;  Service: Endoscopy;  Laterality: N/A;   . ESOPHAGOGASTRODUODENOSCOPY  12/30/2011   Procedure: ESOPHAGOGASTRODUODENOSCOPY (EGD);  Surgeon: Lelon Perla, MD;  Location: [facility] ENDOSCOPY;  Service: Cardiovascular;  Laterality: N/A;  . ESOPHAGOGASTRODUODENOSCOPY  12/30/2011   Procedure: ESOPHAGOGASTRODUODENOSCOPY (EGD);  Surgeon: Lafayette Dragon, MD;  Location: [facility] ENDOSCOPY;  Service: Endoscopy;  Laterality: N/A;  . IR GENERIC HISTORICAL  10/25/2016   IR INT EXT BILIARY DRAIN WITH CHOLANGIOGRAM 10/25/2016 Greggory Keen, MD [facility]-INTERV RAD  . IR GENERIC HISTORICAL  10/28/2016   IR EXCHANGE BILIARY DRAIN 10/28/2016 Greggory Keen, MD [facility]  . IR GENERIC HISTORICAL  10/28/2016   IR ENDOLUMINAL BX OF BILIARY TREE 10/28/2016 Greggory Keen, MD [facility]INTER[facility]  . KNEE ARTHROSCOPY  rt knee  . PROSTATE SURGERY    . TEE WITHOUT CARDIOVERSION  12/30/2011   Procedure: TRANSESOPHAGEAL ECHOCARDIOGRAM (TEE);  Surgeon: Lelon Perla, MD;  Location: [facility] ENDOSCOPY;  Service: Cardiovascular;  Laterality: N/A;  . TEE WITHOUT CARDIOVERSION  02/16/2012   Procedure: TRANSESOPHAGEAL ECHOCARDIOGRAM (TEE);  Surgeon: Lelon Perla, MD;  Location: [facility] ENDOSCOPY;  Service: Cardiovascular;  Laterality: N/A;    Social History:  reports that he has never smoked. He has never used smokeless tobacco. He reports that he does not drink alcohol or use drugs.  Allergies:  Allergies  Allergen Reactions  . Codeine Other (See Comments)    nervousness  . Simvastatin Other (See Comments)    Muscle pain/hurting  . Vicodin [Hydrocodone-Acetaminophen] Other (See Comments)    nervousness    Medications Prior to Admission  Medication Sig Dispense Refill  . albuterol (PROVENTIL HFA;VENTOLIN HFA) 108 (90 BASE) MCG/ACT inhaler Inhale 2 puffs into the lungs every 6 (six) hours as needed for wheezing or shortness of breath.    . clorazepate (TRANXENE) 3.75 MG tablet Take 1.875-3.75 mg by mouth daily as needed for anxiety.     . Emollient (EUCERIN) lotion Apply 3-5 mLs topically  daily.     . fluticasone (FLONASE) 50 MCG/ACT nasal spray Place 1 spray into both nostrils daily as needed for allergies or rhinitis.    . folic acid (FOLVITE) 846 MCG tablet Take 800 mcg by mouth daily.    . furosemide (LASIX) 40 MG tablet TAKE ONE TABLET BY MOUTH ONCE DAILY 90 tablet 2  . guaiFENesin (MUCINEX) 600 MG 12 hr tablet Take 600 mg by mouth 2 (two) times daily as needed.    Marland Kitchen lisinopril (PRINIVIL,ZESTRIL) 5 MG tablet TAKE ONE TABLET BY MOUTH ONCE DAILY (Patient taking differently: TAKE ONE TABLET BY MOUTH ONCE DAILY AT 5PM) 90 tablet 2  . loperamide (IMODIUM A-D) 2 MG tablet Take 2 mg by mouth 4 (four) times daily as needed for diarrhea or loose stools.    . nitroGLYCERIN (NITROSTAT) 0.4 MG SL tablet Place 1 tablet (0.4 mg total) under the tongue every 5 (five) minutes x 3 doses as needed for chest pain. 25 tablet 6  . omeprazole (PRILOSEC) 20 MG capsule Take 20 mg by mouth daily before breakfast.     . phytonadione (VITAMIN K) 5 MG tablet Take 5 mg  by mouth once.    . polyethylene glycol (MIRALAX / GLYCOLAX) packet Take 17 g by mouth at bedtime as needed.     . potassium chloride SA (K-DUR,KLOR-CON) 20 MEQ tablet Take 20 mEq by mouth 2 (two) times daily.    Marland Kitchen warfarin (COUMADIN) 5 MG tablet Take as directed by coumadin clinic (Patient taking differently: Take 2.5-5 mg by mouth See admin instructions. Take 2.'5mg'$  on MONDAYS AND FRIDAYS, take '5mg'$ s daily on all other days of the week) 120 tablet 1  . cefdinir (OMNICEF) 300 MG capsule Take 300 mg by mouth 2 (two) times daily.    Marland Kitchen oseltamivir (TAMIFLU) 75 MG capsule Take 75 mg by mouth 2 (two) times daily.      Prior to Admission medications   Medication Sig Start Date End Date Taking? Authorizing Provider  albuterol (PROVENTIL HFA;VENTOLIN HFA) 108 (90 BASE) MCG/ACT inhaler Inhale 2 puffs into the lungs every 6 (six) hours as needed for wheezing or shortness of breath.   Yes Historical Provider, MD  clorazepate (TRANXENE) 3.75 MG tablet  Take 1.875-3.75 mg by mouth daily as needed for anxiety.    Yes Historical Provider, MD  Emollient (EUCERIN) lotion Apply 3-5 mLs topically daily.    Yes Historical Provider, MD  fluticasone (FLONASE) 50 MCG/ACT nasal spray Place 1 spray into both nostrils daily as needed for allergies or rhinitis.   Yes Historical Provider, MD  folic acid (FOLVITE) 409 MCG tablet Take 800 mcg by mouth daily.   Yes Historical Provider, MD  furosemide (LASIX) 40 MG tablet TAKE ONE TABLET BY MOUTH ONCE DAILY 09/26/16  Yes Burtis Junes, NP  guaiFENesin (MUCINEX) 600 MG 12 hr tablet Take 600 mg by mouth 2 (two) times daily as needed.   Yes Historical Provider, MD  lisinopril (PRINIVIL,ZESTRIL) 5 MG tablet TAKE ONE TABLET BY MOUTH ONCE DAILY Patient taking differently: TAKE ONE TABLET BY MOUTH ONCE DAILY AT 5PM 06/27/16  Yes Burtis Junes, NP  loperamide (IMODIUM A-D) 2 MG tablet Take 2 mg by mouth 4 (four) times daily as needed for diarrhea or loose stools.   Yes Historical Provider, MD  nitroGLYCERIN (NITROSTAT) 0.4 MG SL tablet Place 1 tablet (0.4 mg total) under the tongue every 5 (five) minutes x 3 doses as needed for chest pain. 05/15/14 10/06/17 Yes Burtis Junes, NP  omeprazole (PRILOSEC) 20 MG capsule Take 20 mg by mouth daily before breakfast.    Yes Historical Provider, MD  phytonadione (VITAMIN K) 5 MG tablet Take 5 mg by mouth once.   Yes Historical Provider, MD  polyethylene glycol (MIRALAX / GLYCOLAX) packet Take 17 g by mouth at bedtime as needed.    Yes Historical Provider, MD  potassium chloride SA (K-DUR,KLOR-CON) 20 MEQ tablet Take 20 mEq by mouth 2 (two) times daily.   Yes Historical Provider, MD  warfarin (COUMADIN) 5 MG tablet Take as directed by coumadin clinic Patient taking differently: Take 2.5-5 mg by mouth See admin instructions. Take 2.'5mg'$  on MONDAYS AND FRIDAYS, take '5mg'$ s daily on all other days of the week 07/13/13  Yes Thompson Grayer, MD  cefdinir (OMNICEF) 300 MG capsule Take 300 mg by  mouth 2 (two) times daily. 10/09/16   Historical Provider, MD  oseltamivir (TAMIFLU) 75 MG capsule Take 75 mg by mouth 2 (two) times daily. 10/09/16   Historical Provider, MD    Blood pressure (!) 110/57, pulse 69, temperature 97.9 F (36.6 C), temperature source Oral, resp. rate 20, height '5\' 10"'$  (1.778  m), weight 193 lb 1.6 oz (87.6 kg), SpO2 99 %. Physical Exam: General: pleasant, WD/WN white male who is laying in bed in NAD HEENT: head is normocephalic, atraumatic.  Scleral icterus. Mouth is pink and moist Heart: irregularly irregular.  No obvious murmurs, gallops, or rubs noted.  Palpable pedal pulses bilaterally Lungs: CTAB, no wheezes, rhonchi, or rales noted.  Respiratory effort nonlabored Abd: soft, NT/ND, +BS, no masses, hernias, or organomegaly MS: all 4 extremities are symmetrical with no cyanosis, clubbing, or edema. Skin: warm and dry with no masses, lesions, or rashes. +jaundice Psych: A&Ox3 with an appropriate affect. Neuro: CM 2-12 intact, extremity CSM intact bilaterally, normal speech  Results for orders placed or performed during the hospital encounter of 10/22/16 (from the past 48 hour(s))  Comprehensive metabolic panel     Status: Abnormal   Collection Time: 10/30/16  4:51 AM  Result Value Ref Range   Sodium 139 135 - 145 mmol/L   Potassium 4.0 3.5 - 5.1 mmol/L   Chloride 110 101 - 111 mmol/L   CO2 24 22 - 32 mmol/L   Glucose, Bld 104 (H) 65 - 99 mg/dL   BUN 29 (H) 6 - 20 mg/dL   Creatinine, Ser 1.16 0.61 - 1.24 mg/dL   Calcium 8.4 (L) 8.9 - 10.3 mg/dL   Total Protein 5.4 (L) 6.5 - 8.1 g/dL   Albumin 2.2 (L) 3.5 - 5.0 g/dL   AST 56 (H) 15 - 41 U/L   ALT 69 (H) 17 - 63 U/L   Alkaline Phosphatase 212 (H) 38 - 126 U/L   Total Bilirubin 11.1 (H) 0.3 - 1.2 mg/dL   GFR calc non Af Amer 57 (L) >60 mL/min   GFR calc Af Amer >60 >60 mL/min    Comment: (NOTE) The eGFR has been calculated using the CKD EPI equation. This calculation has not been validated in all clinical  situations. eGFR's persistently <60 mL/min signify possible Chronic Kidney Disease.    Anion gap 5 5 - 15  CBC     Status: Abnormal   Collection Time: 10/30/16 11:44 AM  Result Value Ref Range   WBC 7.2 4.0 - 10.5 K/uL   RBC 3.42 (L) 4.22 - 5.81 MIL/uL   Hemoglobin 11.2 (L) 13.0 - 17.0 g/dL   HCT 33.3 (L) 39.0 - 52.0 %   MCV 97.4 78.0 - 100.0 fL   MCH 32.7 26.0 - 34.0 pg   MCHC 33.6 30.0 - 36.0 g/dL   RDW 16.4 (H) 11.5 - 15.5 %   Platelets 135 (L) 150 - 400 K/uL  CEA     Status: None   Collection Time: 10/30/16  3:04 PM  Result Value Ref Range   CEA 3.8 0.0 - 4.7 ng/mL    Comment: (NOTE)       Roche ECLIA methodology       Nonsmokers  <3.9                                     Smokers     <5.6 Performed At: [facility] [place], [place] [id number] Lindon Romp MD QA:[id number]   Lactate dehydrogenase     Status: None   Collection Time: 10/30/16  5:44 PM  Result Value Ref Range   LDH 163 98 - 192 U/L  Comprehensive metabolic panel     Status: Abnormal   Collection Time: 10/31/16  5:24  AM  Result Value Ref Range   Sodium 138 135 - 145 mmol/L   Potassium 3.8 3.5 - 5.1 mmol/L   Chloride 108 101 - 111 mmol/L   CO2 22 22 - 32 mmol/L   Glucose, Bld 107 (H) 65 - 99 mg/dL   BUN 30 (H) 6 - 20 mg/dL   Creatinine, Ser 1.17 0.61 - 1.24 mg/dL   Calcium 8.6 (L) 8.9 - 10.3 mg/dL   Total Protein 5.3 (L) 6.5 - 8.1 g/dL   Albumin 2.2 (L) 3.5 - 5.0 g/dL   AST 64 (H) 15 - 41 U/L   ALT 76 (H) 17 - 63 U/L   Alkaline Phosphatase 205 (H) 38 - 126 U/L   Total Bilirubin 10.2 (H) 0.3 - 1.2 mg/dL   GFR calc non Af Amer 56 (L) >60 mL/min   GFR calc Af Amer >60 >60 mL/min    Comment: (NOTE) The eGFR has been calculated using the CKD EPI equation. This calculation has not been validated in all clinical situations. eGFR's persistently <60 mL/min signify possible Chronic Kidney Disease.    Anion gap 8 5 - 15   No results  found.    Assessment/Plan Hyperbilirubinemia and Jaundice, possible cholangiocarcinoma - bilirubin up to 10.2 today - CT scan showed moderate intrahepatic biliary ductal dilation, mild distention of the gallbladder, distention of the CBD suspicious for CBD or pancreatic carcinoma.  - MRCP was then performed and showed an abrupt cut off of the common bile duct at or immediately beyond the junction of the common hepatic duct and cystic duct with moderate proximal biliary dilatation.  - ERCP was attempted 2/23 but unsuccessful due to severe biliary stricture.  - s/p PTC with I/E drain 10/25/16 - CEA 3.8 - CA 19-9 1581 - cytology from brushings c/w adenocarcinoma  Atrial fibrillation- on coumadin Aortic root enlargement CHF (EF 40-45%) H/o atrial thrombus GERD HTN Lymphoma ([facility])   Plan - Discussed with Dr. Ninfa Linden and we recommend referring patient to Dr. Barry Dienes for consideration of surgical management. I will route her a message to review his chart, and schedule a follow-up appointment in our office.  Jerrye Beavers, [facility] 10/31/2016, 12:44 PM Pager: [phone] Consults: [phone] Mon-Fri 7:00 am-4:30 pm Sat-Sun 7:00 am-11:30 am

## 2016-10-31 NOTE — Care Management Important Message (Signed)
Important Message  Patient Details  Name: Jimmy Myers MRN: [id number] Date of Birth: [birth year]   Medicare Important Message Given:  Yes    Israella Hubert Montine Circle 10/31/2016, 3:56 PM

## 2016-10-31 NOTE — Progress Notes (Signed)
Physical Therapy Treatment Patient Details Name: Jimmy Myers MRN: [id number] DOB: [birth year] Today's Date: 10/31/2016    History of Present Illness Pt is an 83 y/o male admitted from home secondary to worsening jaundice (?carcinoma). ERCP performed on 10/24/16 with another endoscopy scheduled for 2/24. PMH including but not limited to a-fib, CHF, HTN and lymphoma.     PT Comments    Pt making slow improvements.  Still low on activity tolerance.  Variable stability based on fatigue.    Follow Up Recommendations  SNF     Equipment Recommendations       Recommendations for Other Services       Precautions / Restrictions Precautions Precautions: Fall    Mobility  Bed Mobility Overal bed mobility: Modified Independent       Supine to sit: Modified independent (Device/Increase time) Sit to supine: Modified independent (Device/Increase time)      Transfers Overall transfer level: Needs assistance Equipment used: Rolling walker (2 wheeled) Transfers: Sit to/from Stand Sit to Stand: Min guard            Ambulation/Gait Ambulation/Gait assistance: Min guard Ambulation Distance (Feet): 300 Feet (2 standing rests.) Assistive device: Rolling walker (2 wheeled) Gait Pattern/deviations: Step-through pattern Gait velocity: decreased Gait velocity interpretation: Below normal speed for age/gender General Gait Details: More steady with RW.  Still guarded and slow with cadence   Stairs            Wheelchair Mobility    Modified Rankin (Stroke Patients Only)       Balance Overall balance assessment: Needs assistance   Sitting balance-Leahy Scale: Good       Standing balance-Leahy Scale: Fair                      Cognition Arousal/Alertness: Awake/alert Behavior During Therapy: WFL for tasks assessed/performed Overall Cognitive Status: Within Functional Limits for tasks assessed                      Exercises      General  Comments        Pertinent Vitals/Pain Pain Assessment: Faces Faces Pain Scale: Hurts even more Pain Location: R side of abdomen Pain Descriptors / Indicators: Sore Pain Intervention(s): Monitored during session    Home Living                      Prior Function            PT Goals (current goals can now be found in the care plan section) Acute Rehab PT Goals Patient Stated Goal: return home PT Goal Formulation: With patient/family Time For Goal Achievement: 11/08/16 Potential to Achieve Goals: Good Progress towards PT goals: Progressing toward goals    Frequency    Min 3X/week      PT Plan Current plan remains appropriate    Co-evaluation             End of Session   Activity Tolerance: Patient tolerated treatment well Patient left: in bed;with call bell/phone within reach Nurse Communication: Mobility status PT Visit Diagnosis: Unsteadiness on feet (R26.81);Other abnormalities of gait and mobility (R26.89)     Time: [id number] PT Time Calculation (min) (ACUTE ONLY): 18 min  Charges:  $Gait Training: 8-22 mins                    G Codes:       Tessie Fass Agapita Savarino 10/31/2016, 12:54  PM 10/31/2016  Donnella Sham, [place] [phone]  (pager)

## 2016-10-31 NOTE — Clinical Social Work Note (Addendum)
When medically stable patient will discharge to [facility] for [facility] days. He will then be transferred to [facility]. [facility] cannot take patient now due to patients at their facility being diagnosed with the flu. Patients and staff at [facility] are being treated with Tami-flu today and they can accept patients starting on Monday, 3/5. Patient will remain at [facility] for 5 days (at the facilities request) and then transfer to [facility]. Patient and wife agreeable to this plan. Mrs. Rotman mobile [phone] and home [phone].  CSW has initiated and received insurance authorization from [facility] - [id number] effective 3/2 (for 72 hours).  Zoie Sarin Givens, MSW, LCSW Licensed Clinical Social Worker [place] [phone]

## 2016-10-31 NOTE — Care Management Note (Signed)
Case Management Note  Patient Details  Name: Jimmy Myers MRN: [id number] Date of Birth: 1932-07-16  Subjective/Objective:      CM following for progression and d/c planning.               Action/Plan: 10/31/2016 Plan now for short term SNF for rehabb.  [place] working with pt and family   Expected Discharge Date:  11/01/2016              Expected Discharge Plan:  [place]  In-House Referral:  NA  Discharge planning Services  CM Consult  Post Acute Care Choice:  Home Health Choice offered to:  Patient  DME Arranged:  N/A DME Agency:  NA  HH Arranged: NA  HH Agency:NA    Status of Service:  Completed, signed off  If discussed at [place] of Stay Meetings, dates discussed:    Additional Comments:  Adron Bene, RN 10/31/2016, 3:28 PM

## 2016-10-31 NOTE — Clinical Social Work Placement (Addendum)
   CLINICAL SOCIAL WORK PLACEMENT  NOTE INSURANCE AUTH RECEIVED - [id number] EFF. 10/31/16 - GOOD FOR 72 HOURS.   Date:  10/31/2016  Patient Details  Name: Jimmy Myers MRN: [id number] Date of Birth: 10-19-1931  Clinical Social Work is seeking post-discharge placement for this patient at the [place] level of care (*CSW will initial, date and re-position this form in  chart as items are completed):  Yes   Patient/family provided with [place] Work Department's list of facilities offering this level of care within the geographic area requested by the patient (or if unable, by the patient's family).  Yes   Patient/family informed of their freedom to choose among providers that offer the needed level of care, that participate in Medicare, Medicaid or managed care program needed by the patient, have an available bed and are willing to accept the patient.  Yes   Patient/family informed of [place]'s ownership interest in [facility] and [facility], as well as of the fact that they are under no obligation to receive care at these facilities.  PASRR submitted to EDS on 10/30/16     PASRR number received on 10/30/16     Existing PASRR number confirmed on       FL2 transmitted to all facilities in geographic area requested by pt/family on 10/30/16     FL2 transmitted to all facilities within larger geographic area on 10/30/16     Patient informed that his/her managed care company has contracts with or will negotiate with certain facilities, including the following:        Yes   Patient/family informed of bed offers received.  Patient chooses bed at [facility]     Physician recommends and patient chooses bed at      Patient to be transferred to [facility] on  .  Patient to be transferred to facility by       Patient family notified on   of transfer.  Name of family member notified:        PHYSICIAN       Additional Comment:  10/31/16 - Received Josem Kaufmann. from [place] [facility]. For 72 hours.   _______________________________________________ Sable Feil, LCSW 10/31/2016, 3:01 PM

## 2016-11-01 DIAGNOSIS — K219 Gastro-esophageal reflux disease without esophagitis: Secondary | ICD-10-CM | POA: Diagnosis not present

## 2016-11-01 DIAGNOSIS — Q443 Congenital stenosis and stricture of bile ducts: Secondary | ICD-10-CM | POA: Diagnosis not present

## 2016-11-01 DIAGNOSIS — R531 Weakness: Secondary | ICD-10-CM | POA: Diagnosis not present

## 2016-11-01 DIAGNOSIS — F419 Anxiety disorder, unspecified: Secondary | ICD-10-CM | POA: Diagnosis not present

## 2016-11-01 DIAGNOSIS — D696 Thrombocytopenia, unspecified: Secondary | ICD-10-CM | POA: Diagnosis not present

## 2016-11-01 DIAGNOSIS — I5022 Chronic systolic (congestive) heart failure: Secondary | ICD-10-CM | POA: Diagnosis not present

## 2016-11-01 DIAGNOSIS — K210 Gastro-esophageal reflux disease with esophagitis: Secondary | ICD-10-CM | POA: Diagnosis not present

## 2016-11-01 DIAGNOSIS — K7581 Nonalcoholic steatohepatitis (NASH): Secondary | ICD-10-CM | POA: Diagnosis not present

## 2016-11-01 DIAGNOSIS — C221 Intrahepatic bile duct carcinoma: Secondary | ICD-10-CM | POA: Diagnosis not present

## 2016-11-01 DIAGNOSIS — K831 Obstruction of bile duct: Secondary | ICD-10-CM | POA: Diagnosis not present

## 2016-11-01 DIAGNOSIS — Z4803 Encounter for change or removal of drains: Secondary | ICD-10-CM | POA: Diagnosis not present

## 2016-11-01 DIAGNOSIS — I480 Paroxysmal atrial fibrillation: Secondary | ICD-10-CM | POA: Diagnosis not present

## 2016-11-01 DIAGNOSIS — I10 Essential (primary) hypertension: Secondary | ICD-10-CM | POA: Diagnosis not present

## 2016-11-01 DIAGNOSIS — K830 Cholangitis: Secondary | ICD-10-CM | POA: Diagnosis not present

## 2016-11-01 DIAGNOSIS — K838 Other specified diseases of biliary tract: Secondary | ICD-10-CM | POA: Diagnosis not present

## 2016-11-01 DIAGNOSIS — I4891 Unspecified atrial fibrillation: Secondary | ICD-10-CM | POA: Diagnosis not present

## 2016-11-01 DIAGNOSIS — R17 Unspecified jaundice: Secondary | ICD-10-CM | POA: Diagnosis not present

## 2016-11-01 MED ORDER — CLORAZEPATE DIPOTASSIUM 3.75 MG PO TABS: 1.8750 mg | ORAL_TABLET | Freq: Every day | ORAL | 0 refills | Status: DC | PRN

## 2016-11-01 NOTE — Progress Notes (Signed)
Late entry for 1545. [facility] arrived to transport patient to [facility]. NSL discontinued with catheter intact. RN assisted wife with taking patient belongings to car. Bartholomew Crews, RN

## 2016-11-01 NOTE — Clinical Social Work Note (Signed)
Clinical Social Worker facilitated patient discharge including contacting patient family and facility to confirm patient discharge plans. Clinical information faxed to facility and family agreeable with plan. CSW arranged ambulance transport via [facility] to [facility]. RN to call report [phone] prior to discharge.  Clinical Social Worker will sign off for now as social work intervention is no longer needed. Please consult [place] again if new need arises.  Calahan Pak B. Joline Maxcy Clinical Social Work Dept Weekend Social Worker [phone] 2:30 PM

## 2016-11-01 NOTE — Progress Notes (Signed)
Attempted to call [facility] [place] at [phone]. After greater than 10 rings, call was answered then put on hold for 10 minutes. Bartholomew Crews, RN

## 2016-11-01 NOTE — Discharge Summary (Signed)
Physician Discharge Summary  Jimmy Myers [id number] DOB: 04-18-1932 DOA: 81/21/2018  PCP: Kenn File, MD  Admit date: 10/22/2016 Discharge date: 11/01/2016  Admitted From:Home Disposition:to SNF  Recommendations for Outpatient Follow-up:  1. Follow up with PCP and Interventional Radiologist in 1-2 weeks 2. Please obtain BMP/CBC in one week   Home Health:SNF Equipment/Devices:external drain Discharge Condition:stable CODE STATUS:full Diet recommendation:heart healthy  Brief/Interim Summary: 83 y.o.malewith medical history significant of chronic atrial fibrillation, anxiety, aortic root enlargement, osteoarthritis, history of atrial thrombus, lymphoma, chronic systolic congestive heart failure, diverticulosis, GERD, hemorrhoids, hypertension, history of pneumonia, recently treated for influenza who is being referred to the emergency department by his primary care provider due to progressively worsening jaundice. CT scan abdomen/pelvis showed moderate intrahepatic biliary ductal dilation, mild distention of the gallbladder, distention of the CBD suspicious for CBD or pancreatic carcinoma. ERCP unsuccessful. External biliary drainage was placed by Ir.  #  Obstructive jaundice likely cholangiocarcinoma and possible ? Acute cholangitis: -Status post external drain  placed by interventional radiologist. CA 19-9 significantly elevated.  -the tissue brushing cytology c/w adenocarcinoma. Oncology consult was obtained and discussed with Dr. Irene Limbo on 10/31/2016. He recommended CT scan of chest to work up for metastasis and surgery consult to evaluate to see if patient is candidate for any surgical intervention. CT scan of chest was obtained which showed "small 7 mm subcutaneous nodule in the anterior chest wall, nonspecific, but new from chest CT January 2017. This may be a subcutaneous lymph node or metastatic deposit, versus incidental soft tissue nodule." -Gen. surgery consult was obtained  recommend outpatient follow-up. -Patient completed 7 days of cefepime. -As per interventional radiology, plan to discharge with external biliary drain with outpatient follow-up. IR will follow up on it. -Continue supportive care including Zofran as needed, Protonix orally -Patient denied any pain. No nausea vomiting. -Patient was recommended to follow-up with oncologist with all results including abnormal CT scan of chest.  #Chronic systolic CHF: Stable now. Echo in May 2017 showed EF of 40-45%.  #Atrial fibrillation, exact type unknown: Patient had supratherapeutic INR requiring vitamin K on admission. Evaluated by cardiologist recommended to hold Coumadin until patient will be seen as outpatient. I discussed this with the patient he verbalized understanding to follow-up with cardiologist and discuss about Coumadin then.  #Hypertension: Blood pressure acceptable. Continue to monitor.  #History of acid reflux: On Protonix  #Thrombocytopenia: Monitor platelet count. No sign of bleeding. Platelet count 135.  Patient was evaluated by Education officer, museum. Patient is being discharged to rehabilitation for further care.  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[facility])    Discharge Instructions  Discharge Instructions    Call MD for:  difficulty breathing, headache or visual disturbances    Complete by:  As directed    Call MD for:  extreme fatigue    Complete by:  As directed    Call MD for:  hives    Complete by:  As directed    Call MD for:  persistant dizziness or light-headedness    Complete by:  As directed    Call MD for:  persistant nausea and vomiting    Complete by:  As  directed  Call MD for:  severe uncontrolled pain    Complete by:  As directed    Call MD for:  temperature >100.4    Complete by:  As directed    Diet - low sodium heart healthy    Complete by:  As directed    Discharge instructions    Complete by:  As directed    Please follow up with IR , Oncologist, PCP and GI as an outpatient.  Please contact IR for any drain related issue.  Please follow up with cardiologist to discuss timing to resume coumadin.   Increase activity slowly    Complete by:  As directed      Allergies as of 11/01/2016      Reactions   Codeine Other (See Comments)   nervousness   Simvastatin Other (See Comments)   Muscle pain/hurting   Vicodin [hydrocodone-acetaminophen] Other (See Comments)   nervousness      Medication List    STOP taking these medications   cefdinir 300 MG capsule Commonly known as:  OMNICEF   lisinopril 5 MG tablet Commonly known as:  PRINIVIL,ZESTRIL   loperamide 2 MG tablet Commonly known as:  IMODIUM A-D   oseltamivir 75 MG capsule Commonly known as:  TAMIFLU   phytonadione 5 MG tablet Commonly known as:  VITAMIN K   warfarin 5 MG tablet Commonly known as:  COUMADIN     TAKE these medications   albuterol 108 (90 Base) MCG/ACT inhaler Commonly known as:  PROVENTIL HFA;VENTOLIN HFA Inhale 2 puffs into the lungs every 6 (six) hours as needed for wheezing or shortness of breath.   clorazepate 3.75 MG tablet Commonly known as:  TRANXENE Take 0.5-1 tablets (1.875-3.75 mg total) by mouth daily as needed for anxiety.   eucerin lotion Apply 3-5 mLs topically daily.   fluticasone 50 MCG/ACT nasal spray Commonly known as:  FLONASE Place 1 spray into both nostrils daily as needed for allergies or rhinitis.   folic acid [id number] MCG tablet Commonly known as:  FOLVITE Take 800 mcg by mouth daily.   furosemide 40 MG tablet Commonly known as:  LASIX TAKE ONE TABLET BY MOUTH ONCE DAILY   guaiFENesin 600 MG 12 hr  tablet Commonly known as:  MUCINEX Take 600 mg by mouth 2 (two) times daily as needed.   nitroGLYCERIN 0.4 MG SL tablet Commonly known as:  NITROSTAT Place 1 tablet (0.4 mg total) under the tongue every 5 (five) minutes x 3 doses as needed for chest pain.   omeprazole 20 MG capsule Commonly known as:  PRILOSEC Take 20 mg by mouth daily before breakfast.   polyethylene glycol packet Commonly known as:  MIRALAX / GLYCOLAX Take 17 g by mouth at bedtime as needed.   potassium chloride SA 20 MEQ tablet Commonly known as:  K-DUR,KLOR-CON Take 20 mEq by mouth 2 (two) times daily.      Follow-up Information    SHICK, MICHAEL, MD Follow up in 2 week(s).   Specialty:  Interventional Radiology Why:  our office will call you with an appointment date and time in 1-2 weeks for a drain injection to determine when your drain can be capped Contact information: [address] [place] [address] [phone]        BYERLY,FAERA, MD. Go on 11/12/2016.   Specialty:  General Surgery Why:  Your appointment is 11/12/16 at 11:45AM. Please arrive 30 minutes prior to your appointment to check in and fill out necessary paperwork. Contact information:  [address] [place] [address] [phone]        Kenn File, MD. Schedule an appointment as soon as possible for a visit in 1 week(s).   Specialty:  Family Medicine Contact information: [place] [address] [place], [place]. Schedule an appointment as soon as possible for a visit in 2 week(s).   Specialties:  Hematology, Oncology Contact information: [address] [place] [address] [phone]          Allergies  Allergen Reactions  . Codeine Other (See Comments)    nervousness  . Simvastatin Other (See Comments)    Muscle pain/hurting  . Vicodin [Hydrocodone-Acetaminophen] Other (See Comments)    nervousness     Consultations: Surgery GI Cardiology Oncology Interventional radiology  Procedures/Studies: ERCP, external biliary drain  Subjective: Patient was seen and examined at bedside. He reported doing well. Denied fever, chills, nausea, vomiting, chest pain, shortness of breath. No abdominal pain. Able to eat well.  Discharge Exam: Vitals:   11/01/16 0439 11/01/16 1000  BP: 112/60 116/74  Pulse: 73 79  Resp: 16 18  Temp: 98.3 F (36.8 C) 98.2 F (36.8 C)   Vitals:   10/31/16 2158 10/31/16 2206 11/01/16 0439 11/01/16 1000  BP: 126/64  112/60 116/74  Pulse: 74  73 79  Resp: 18  16 18   Temp: 98.2 F (36.8 C)  98.3 F (36.8 C) 98.2 F (36.8 C)  TempSrc: Oral  Oral Oral  SpO2: 99%  97% 97%  Weight:  85.1 kg (187 lb 9.6 oz)    Height:        General: Pt is alert, awake, not in acute distress Eye: icterus + Cardiovascular: RRR, S1/S2 +, no rubs, no gallops Respiratory: CTA bilaterally, no wheezing, no rhonchi Abdominal: Soft, NT, ND, bowel sounds +. External biliary drain site clean, bile juice on the bag Extremities: no edema, no cyanosis Neuro: Alert, awake, oriented.   The results of significant diagnostics from this hospitalization (including imaging, microbiology, ancillary and laboratory) are listed below for reference.     Microbiology: Recent Results (from the past 240 hour(s))  Aerobic/Anaerobic Culture (surgical/deep wound)     Status: None   Collection Time: 10/25/16 10:49 AM  Result Value Ref Range Status   Specimen Description BILE  Final   Special Requests NONE  Final   Gram Stain   Final    FEW WBC PRESENT, PREDOMINANTLY PMN RARE GRAM NEGATIVE RODS    Culture   Final    ABUNDANT ENTEROBACTER SPECIES NO ANAEROBES ISOLATED; CULTURE IN PROGRESS FOR 5 DAYS    Report Status 10/30/2016 FINAL  Final   Organism ID, Bacteria ENTEROBACTER SPECIES  Final      Susceptibility   Enterobacter species - MIC*    CEFAZOLIN >=64 RESISTANT Resistant      CEFEPIME <=1 SENSITIVE Sensitive     CEFTAZIDIME <=1 SENSITIVE Sensitive     CEFTRIAXONE <=1 SENSITIVE Sensitive     CIPROFLOXACIN <=0.25 SENSITIVE Sensitive     GENTAMICIN <=1 SENSITIVE Sensitive     IMIPENEM 1 SENSITIVE Sensitive     TRIMETH/SULFA <=20 SENSITIVE Sensitive     PIP/TAZO 16 SENSITIVE Sensitive     * ABUNDANT ENTEROBACTER SPECIES     Labs: BNP (last 3 results) No results for input(s): BNP in the last 8760 hours. Basic Metabolic Panel:  Recent Labs Lab 10/26/16 0703 10/26/16 1811 10/27/16 0831 10/29/16 0514 10/30/16  0451 10/31/16 0524  NA 141  --  141 139 139 138  K 3.9  --  3.7 3.5 4.0 3.8  CL 113*  --  108 108 110 108  CO2 24  --  26 24 24 22   GLUCOSE 96  --  128* 103* 104* 107*  BUN 27*  --  24* 27* 29* 30*  CREATININE 1.26*  --  1.12 1.11 1.16 1.17  CALCIUM 8.2*  --  8.5* 8.4* 8.4* 8.6*  MG  --  1.9  --   --   --   --    Liver Function Tests:  Recent Labs Lab 10/26/16 0703 10/27/16 0831 10/29/16 0514 10/30/16 0451 10/31/16 0524  AST 56* 58* 56* 56* 64*  ALT 81* 78* 71* 69* 76*  ALKPHOS 282* 272* 207* 212* 205*  BILITOT 8.0* 9.4* 12.0* 11.1* 10.2*  PROT 5.1* 5.6* 5.4* 5.4* 5.3*  ALBUMIN 2.1* 2.3* 2.1* 2.2* 2.2*   No results for input(s): LIPASE, AMYLASE in the last 168 hours. No results for input(s): AMMONIA in the last 168 hours. CBC:  Recent Labs Lab 10/27/16 0831 10/30/16 1144  WBC 7.8 7.2  HGB 10.8* 11.2*  HCT 33.2* 33.3*  MCV 97.1 97.4  PLT 106* 135*   Cardiac Enzymes: No results for input(s): CKTOTAL, CKMB, CKMBINDEX, TROPONINI in the last 168 hours. BNP: Invalid input(s): POCBNP CBG: No results for input(s): GLUCAP in the last 168 hours. D-Dimer No results for input(s): DDIMER in the last 72 hours. Hgb A1c No results for input(s): HGBA1C in the last 72 hours. Lipid Profile No results for input(s): CHOL, HDL, LDLCALC, TRIG, CHOLHDL, LDLDIRECT in the last 72 hours. Thyroid function studies No results for input(s): TSH,  T4TOTAL, T3FREE, THYROIDAB in the last 72 hours.  Invalid input(s): FREET3 Anemia work up No results for input(s): VITAMINB12, FOLATE, FERRITIN, TIBC, IRON, RETICCTPCT in the last 72 hours. Urinalysis    Component Value Date/Time   COLORURINE YELLOW 12/28/2011 1658   APPEARANCEUR Clear 10/22/2016 1232   LABSPEC 1.012 12/28/2011 1658   PHURINE 5.5 12/28/2011 1658   GLUCOSEU Negative 10/22/2016 1232   HGBUR SMALL (A) 12/28/2011 1658   BILIRUBINUR Positive (A) 10/22/2016 1232   KETONESUR NEGATIVE 12/28/2011 1658   PROTEINUR Negative 10/22/2016 1232   PROTEINUR NEGATIVE 12/28/2011 1658   UROBILINOGEN 1.0 12/28/2011 1658   NITRITE Negative 10/22/2016 1232   NITRITE NEGATIVE 12/28/2011 1658   LEUKOCYTESUR Negative 10/22/2016 1232   Sepsis Labs Invalid input(s): PROCALCITONIN,  WBC,  LACTICIDVEN Microbiology Recent Results (from the past 240 hour(s))  Aerobic/Anaerobic Culture (surgical/deep wound)     Status: None   Collection Time: 10/25/16 10:49 AM  Result Value Ref Range Status   Specimen Description BILE  Final   Special Requests NONE  Final   Gram Stain   Final    FEW WBC PRESENT, PREDOMINANTLY PMN RARE GRAM NEGATIVE RODS    Culture   Final    ABUNDANT ENTEROBACTER SPECIES NO ANAEROBES ISOLATED; CULTURE IN PROGRESS FOR 5 DAYS    Report Status 10/30/2016 FINAL  Final   Organism ID, Bacteria ENTEROBACTER SPECIES  Final      Susceptibility   Enterobacter species - MIC*    CEFAZOLIN >=64 RESISTANT Resistant     CEFEPIME <=1 SENSITIVE Sensitive     CEFTAZIDIME <=1 SENSITIVE Sensitive     CEFTRIAXONE <=1 SENSITIVE Sensitive     CIPROFLOXACIN <=0.25 SENSITIVE Sensitive     GENTAMICIN <=1 SENSITIVE Sensitive     IMIPENEM 1 SENSITIVE  Sensitive     TRIMETH/SULFA <=20 SENSITIVE Sensitive     PIP/TAZO 16 SENSITIVE Sensitive     * ABUNDANT ENTEROBACTER SPECIES     Time coordinating discharge: 33 minutes  SIGNED:   Rosita Fire, MD  [facility] 11/01/2016, 11:32 AM  If 7PM-7AM, please contact night-coverage [email or web address] Password [email or web address]

## 2016-11-01 NOTE — Progress Notes (Signed)
Patient ID: Jimmy Myers, male   DOB: 1931/09/14, 83 y.o.   MRN: [id number]    Referring Physician(s): Dr. Wilfrid Lund  Supervising Physician: Aletta Edouard  Patient Status: [facility] - In-pt  Chief Complaint: Obstructive jaundice  Subjective: Patient feels well.  No new complaints.  Allergies: Codeine; Simvastatin; and Vicodin [hydrocodone-acetaminophen]  Medications: Prior to Admission medications   Medication Sig Start Date End Date Taking? Authorizing Provider  albuterol (PROVENTIL HFA;VENTOLIN HFA) 108 (90 BASE) MCG/ACT inhaler Inhale 2 puffs into the lungs every 6 (six) hours as needed for wheezing or shortness of breath.   Yes Historical Provider, MD  clorazepate (TRANXENE) 3.75 MG tablet Take 1.875-3.75 mg by mouth daily as needed for anxiety.    Yes Historical Provider, MD  Emollient (EUCERIN) lotion Apply 3-5 mLs topically daily.    Yes Historical Provider, MD  fluticasone (FLONASE) 50 MCG/ACT nasal spray Place 1 spray into both nostrils daily as needed for allergies or rhinitis.   Yes Historical Provider, MD  folic acid (FOLVITE) 502 MCG tablet Take 800 mcg by mouth daily.   Yes Historical Provider, MD  furosemide (LASIX) 40 MG tablet TAKE ONE TABLET BY MOUTH ONCE DAILY 09/26/16  Yes Burtis Junes, NP  guaiFENesin (MUCINEX) 600 MG 12 hr tablet Take 600 mg by mouth 2 (two) times daily as needed.   Yes Historical Provider, MD  lisinopril (PRINIVIL,ZESTRIL) 5 MG tablet TAKE ONE TABLET BY MOUTH ONCE DAILY Patient taking differently: TAKE ONE TABLET BY MOUTH ONCE DAILY AT 5PM 06/27/16  Yes Burtis Junes, NP  loperamide (IMODIUM A-D) 2 MG tablet Take 2 mg by mouth 4 (four) times daily as needed for diarrhea or loose stools.   Yes Historical Provider, MD  nitroGLYCERIN (NITROSTAT) 0.4 MG SL tablet Place 1 tablet (0.4 mg total) under the tongue every 5 (five) minutes x 3 doses as needed for chest pain. 05/15/14 10/06/17 Yes Burtis Junes, NP  omeprazole (PRILOSEC) 20 MG capsule Take  20 mg by mouth daily before breakfast.    Yes Historical Provider, MD  phytonadione (VITAMIN K) 5 MG tablet Take 5 mg by mouth once.   Yes Historical Provider, MD  polyethylene glycol (MIRALAX / GLYCOLAX) packet Take 17 g by mouth at bedtime as needed.    Yes Historical Provider, MD  potassium chloride SA (K-DUR,KLOR-CON) 20 MEQ tablet Take 20 mEq by mouth 2 (two) times daily.   Yes Historical Provider, MD  warfarin (COUMADIN) 5 MG tablet Take as directed by coumadin clinic Patient taking differently: Take 2.5-5 mg by mouth See admin instructions. Take 2.11m on MMulberry take 550m daily on all other days of the week 07/13/13  Yes JaThompson GrayerMD  cefdinir (OMNICEF) 300 MG capsule Take 300 mg by mouth 2 (two) times daily. 10/09/16   Historical Provider, MD  oseltamivir (TAMIFLU) 75 MG capsule Take 75 mg by mouth 2 (two) times daily. 10/09/16   Historical Provider, MD    Vital Signs: BP 116/74 (BP Location: Right Arm)   Pulse 79   Temp 98.2 F (36.8 C) (Oral)   Resp 18   Ht _0  (1.778 m)   Wt 187 lb 9.6 oz (85.1 kg)   SpO2 97%   BMI 26.92 kg/m   Physical Exam: Abd: soft, drain site is c/d/i, perc drain with bilious drainage.  This is less cloudy and more clear.  Imaging: Ct Chest Wo Contrast  Result Date: 10/31/2016 CLINICAL DATA:  Cholangiocarcinoma.  Metastatic disease evaluation. EXAM:  CT CHEST WITHOUT CONTRAST TECHNIQUE: Multidetector CT imaging of the chest was performed following the standard protocol without IV contrast. COMPARISON:  Chest CT 09/17/2015, additional priors FINDINGS: Cardiovascular: Unchanged known dilatation of the aortic root, maximal dimension 5.6 cm. Unchanged diffuse disc aneurysmal dilatation of the ascending aorta, maximal dimension 4.6 cm, allowing for differences in caliper placement. Scattered mild atherosclerosis throughout. There are coronary artery calcifications. Mediastinum/Nodes: No mediastinal adenopathy. No bulky hilar adenopathy, decreased  sensitivity given lack of IV contrast. There is a 7 mm soft tissue nodule in the subcutaneous tissues superficial to the pectoralis muscle image 28 series 201. Minimal pericardial fluid, small effusion with fluid in the pericardial recesses. The esophagus is decompressed. Heterogeneously enlarged left lobe of the thyroid gland with mild mass effect on the trachea, stable in CT appearance from prior. Lungs/Pleura: Trace right pleural effusion and right lower lobe atelectasis. The 4 mm right paramediastinal nodule image 71 series 205 is unchanged from prior exam and likely pleural thickening. Unchanged 3 mm are left lower lobe pulmonary nodule image 101 series 205. No new pulmonary nodules. Minimal intraluminal debris within the dependent right mainstem bronchus. No focal airspace disease to suggest pneumonia. No pulmonary edema. Upper Abdomen: Biliary drain in place. Small associated pneumobilia. Small hiatal hernia. No adrenal nodule. Atherosclerosis of the upper abdominal aorta. Right renal cyst. Musculoskeletal: Diffuse bony under mineralization. No blastic or destructive lytic lesions. IMPRESSION: 1. Small 7 mm subcutaneous nodule in the anterior chest wall, nonspecific, but new from chest CT January 2017. This may be a subcutaneous lymph node or metastatic deposit, versus incidental soft tissue nodule. 2. There is otherwise no evidence of metastatic disease in the thorax. 3. Stable aneurysmal dilatation of the aortic root and ascending aorta. 4. Unchanged 3 mm left lower lobe pulmonary nodule dating back to 2013, benign. 5. Stable CT appearance of left thyroid nodule. Electronically Signed   By: Jeb Levering M.D.   On: 10/31/2016 19:41   Ir Exchange Biliary Drain  Result Date: 10/28/2016 INDICATION: Common bile duct obstruction, obstructive jaundice EXAM: IR EXCHANGE BILARY DRAIN; IR ENDOLUMINAL BIOPSY OF BILIARY TREE MEDICATIONS: Patient is already receiving antibiotics as an inpatient.  ANESTHESIA/SEDATION: Moderate (conscious) sedation was employed during this procedure. A total of Versed 2.0 mg and Fentanyl 100 mcg was administered intravenously. Moderate Sedation Time: 12 minutes. The patient's level of consciousness and vital signs were monitored continuously by radiology nursing throughout the procedure under my direct supervision. FLUOROSCOPY TIME:  Fluoroscopy Time: 4 minutes 0 seconds (23 mGy). COMPLICATIONS: None immediate. PROCEDURE: Informed written consent was obtained from the patient after a thorough discussion of the procedural risks, benefits and alternatives. All questions were addressed. Maximal Sterile Barrier Technique was utilized including caps, mask, sterile gowns, sterile gloves, sterile drape, hand hygiene and skin antiseptic. A timeout was performed prior to the initiation of the procedure. Under sterile conditions and local anesthesia, the existing right internal external 10 [place] biliary drain was removed over an Amplatz guidewire. 8 French sheath inserted into the common bile duct across the CBD obstruction. Endoluminal brush biopsy performed of the CBD obstruction 3 times. Samples placed in saline. Over the guidewire, a 10 [place] internal external biliary drain was replaced. Retention loop formed in the duodenum. Contrast injection confirms position. There is adequate drainage of the biliary tree. CBD obstruction persist. IMPRESSION: Successful endoluminal brush biopsy of the common bile duct obstruction. Replacement of the 10 [place] internal external right biliary drain catheter. Electronically Signed   By: Jerilynn Mages.  Shick M.D.   On: 10/28/2016 14:51   Ir Endoluminal Bx Of Biliary Tree  Result Date: 10/28/2016 INDICATION: Common bile duct obstruction, obstructive jaundice EXAM: IR EXCHANGE BILARY DRAIN; IR ENDOLUMINAL BIOPSY OF BILIARY TREE MEDICATIONS: Patient is already receiving antibiotics as an inpatient. ANESTHESIA/SEDATION: Moderate (conscious) sedation was  employed during this procedure. A total of Versed 2.0 mg and Fentanyl 100 mcg was administered intravenously. Moderate Sedation Time: 12 minutes. The patient's level of consciousness and vital signs were monitored continuously by radiology nursing throughout the procedure under my direct supervision. FLUOROSCOPY TIME:  Fluoroscopy Time: 4 minutes 0 seconds (23 mGy). COMPLICATIONS: None immediate. PROCEDURE: Informed written consent was obtained from the patient after a thorough discussion of the procedural risks, benefits and alternatives. All questions were addressed. Maximal Sterile Barrier Technique was utilized including caps, mask, sterile gowns, sterile gloves, sterile drape, hand hygiene and skin antiseptic. A timeout was performed prior to the initiation of the procedure. Under sterile conditions and local anesthesia, the existing right internal external 10 [place] biliary drain was removed over an Amplatz guidewire. 8 French sheath inserted into the common bile duct across the CBD obstruction. Endoluminal brush biopsy performed of the CBD obstruction 3 times. Samples placed in saline. Over the guidewire, a 10 [place] internal external biliary drain was replaced. Retention loop formed in the duodenum. Contrast injection confirms position. There is adequate drainage of the biliary tree. CBD obstruction persist. IMPRESSION: Successful endoluminal brush biopsy of the common bile duct obstruction. Replacement of the 10 [place] internal external right biliary drain catheter. Electronically Signed   By: Jerilynn Mages.  Shick M.D.   On: 10/28/2016 14:51    Labs:  CBC:  Recent Labs  10/24/16 0731 10/25/16 0520 10/27/16 0831 10/30/16 1144  WBC 6.9 7.1 7.8 7.2  HGB 9.7* 10.1* 10.8* 11.2*  HCT 29.1* 30.3* 33.2* 33.3*  PLT 104* 63* 106* 135*    COAGS:  Recent Labs  10/23/16 0607 10/24/16 0731 10/25/16 0520 10/26/16 0703  INR 4.62* 1.25 1.01 0.96    BMP:  Recent Labs  10/27/16 0831 10/29/16 0514  10/30/16 0451 10/31/16 0524  NA 141 139 139 138  K 3.7 3.5 4.0 3.8  CL 108 108 110 108  CO2 _0 GLUCOSE 128* 103* 104* 107*  BUN 24* 27* 29* 30*  CALCIUM 8.5* 8.4* 8.4* 8.6*  CREATININE 1.12 1.11 1.16 1.17  GFRNONAA 59* 60* 57* 56*  GFRAA >60 >60 >60 >60    LIVER FUNCTION TESTS:  Recent Labs  10/27/16 0831 10/29/16 0514 10/30/16 0451 10/31/16 0524  BILITOT 9.4* 12.0* 11.1* 10.2*  AST 58* 56* 56* 64*  ALT 78* 71* 69* 76*  ALKPHOS 272* 207* 212* 205*  PROT 5.6* 5.4* 5.4* 5.3*  ALBUMIN 2.3* 2.1* 2.2* 2.2*    Assessment and Plan: 1. Obstructive jaundice secondary to cholangiocarcinoma, s/p PTC drain placement -patient's drain is stable.  TB is stable.  We will continue to have the drain externalized while his TB remains elevated and to allow the bile to become more clear. -an order has been placed and our office will contact him to set up a drain clinic appointment in 1-2 weeks with a CMET prior to the visit  Electronically Signed: Shaletha Humble E 11/01/2016, 11:11 AM   I spent a total of 15 Minutes at the the patient's bedside AND on the patient's hospital floor or unit, greater than 50% of which was counseling/coordinating care for obstructive jaundice .

## 2016-11-01 NOTE — Clinical Social Work Placement (Signed)
   CLINICAL SOCIAL WORK PLACEMENT  NOTE  Date:  11/01/2016  Patient Details  Name: [name] MRN: [id number] Date of Birth: 1932-01-30  Clinical Social Work is seeking post-discharge placement for this patient at the [place] level of care (*CSW will initial, date and re-position this form in  chart as items are completed):  Yes   Patient/family provided with [facility] Work Department's list of facilities offering this level of care within the geographic area requested by the patient (or if unable, by the patient's family).  Yes   Patient/family informed of their freedom to choose among providers that offer the needed level of care, that participate in Medicare, Medicaid or managed care program needed by the patient, have an available bed and are willing to accept the patient.  Yes   Patient/family informed of [facility]'s ownership interest in [facility] and [facility], as well as of the fact that they are under no obligation to receive care at these facilities.  PASRR submitted to EDS on 10/30/16     PASRR number received on 10/30/16     Existing PASRR number confirmed on       FL2 transmitted to all facilities in geographic area requested by pt/family on 10/30/16     FL2 transmitted to all facilities within larger geographic area on 10/30/16     Patient informed that his/her managed care company has contracts with or will negotiate with certain facilities, including the following:        Yes   Patient/family informed of bed offers received.  Patient chooses bed at [facility]     Physician recommends and patient chooses bed at      Patient to be transferred to [facility] on 11/01/16.  Patient to be transferred to facility by  Corey Harold)     Patient family notified on 11/01/16 of transfer.  Name of family member notified:        PHYSICIAN       Additional Comment:     _______________________________________________ Serafina Mitchell, [place] 11/01/2016, 2:31 PM

## 2016-11-03 ENCOUNTER — Non-Acute Institutional Stay (SKILLED_NURSING_FACILITY): Payer: PPO | Admitting: Internal Medicine

## 2016-11-03 ENCOUNTER — Encounter: Payer: Self-pay | Admitting: Internal Medicine

## 2016-11-03 ENCOUNTER — Other Ambulatory Visit: Payer: Self-pay | Admitting: General Surgery

## 2016-11-03 ENCOUNTER — Telehealth: Payer: Self-pay | Admitting: Nurse Practitioner

## 2016-11-03 DIAGNOSIS — K838 Other specified diseases of biliary tract: Secondary | ICD-10-CM | POA: Diagnosis not present

## 2016-11-03 DIAGNOSIS — F419 Anxiety disorder, unspecified: Secondary | ICD-10-CM | POA: Diagnosis not present

## 2016-11-03 DIAGNOSIS — I5022 Chronic systolic (congestive) heart failure: Secondary | ICD-10-CM

## 2016-11-03 DIAGNOSIS — K2100 Gastro-esophageal reflux disease with esophagitis, without bleeding: Secondary | ICD-10-CM

## 2016-11-03 DIAGNOSIS — D696 Thrombocytopenia, unspecified: Secondary | ICD-10-CM

## 2016-11-03 DIAGNOSIS — I480 Paroxysmal atrial fibrillation: Secondary | ICD-10-CM

## 2016-11-03 DIAGNOSIS — K831 Obstruction of bile duct: Secondary | ICD-10-CM

## 2016-11-03 DIAGNOSIS — C221 Intrahepatic bile duct carcinoma: Secondary | ICD-10-CM | POA: Diagnosis not present

## 2016-11-03 NOTE — Telephone Encounter (Signed)
Walk In pt form- Application for Disability Parking Pla- Card. Dropped off placed in [employer].

## 2016-11-03 NOTE — Progress Notes (Signed)
Patient ID: Jimmy Myers, male   DOB: 09/08/1931, 83 y.o.   MRN: [id number]    HISTORY AND PHYSICAL   DATE: 11/03/2016  Location:    [place] Room Number: 097 B Place of Service: SNF (31)   Extended Emergency Contact Information Primary Emergency Contact: Worsley,Fay Address: [address]          [place], [place] [address] [place] Phone: [phone] Relation: Spouse Secondary Emergency Contact: Leone Payor [place] Phone: [phone] Work Phone: [phone] Relation: Son  Advanced Directive information Does Patient Have a Medical Advance Directive?: No, Would patient like information on creating a medical advance directive?: No - Patient declined  Chief Complaint  Patient presents with  . New Admit To SNF    HPI:  83 yo male seen today as a new admission into SNF following hospital stay for obstructive jaundice, CBD stricture, acute cholangitis, cholangioCA, chronic systolic HF, PAF, supratherapeutic INR, HTN.  He presented to the ED with progressively worsening jaundice. CT abd/pelvis showed moderate intrahepatic duct biliary dilatation; mild distension of gallbladder; distension of CBD suspicious for CBD/pancreatic CA. ERCP unsuccessful. IR placed external drain on 10/29/16. Tissue brushing cytology c/w adenoCA. CT chest revealed new 57m subcut nodule in ACW, nonspecific. Pt completed 7 days of IV cefepime. Vit K given for suprapubic INR. Cr 1.17; albumin 2.2; alk phos 562-->205; AST 113-->64; ALT 144-->76; total bilirubin peaked at 14.9-->10.2; direct bilirubin 6.5; plts dropped to 63K-->135K; Hgb 11.2; INR 5.2-->0.96; CA 19-9 level 1501 at d/c. He presents to SNF for short term rehab.  Today he reports c/a drain f/u and anticoagulation for afib. Appetite poor most times. No N/V. Abdominal pain at GB drain insertion site. No f/c. No change in bowel habits. He has an intermittent cough. Fatigue and weakness but does well with PT.     Chronic systolic HF/PAF/ aortic root dilation (5.6cm in Mar 2018)- he takes lasix daily with KCL supplement, prn SL NTG. Rate controlled. He has had a cardioversion in the past. Currently not on anticoagulant per EPIC chart until he is seen by Cardio. He takes albuterl HFA prn and mucinex prn. 2D echo (May 2017) revealed EF 40-45% with diffuse hypokinesis and grade 1 DD  Allergic rhinitis - stable on flonase  GERD - stable on omeprazole  Anxiety - stable on prn clorazepate  Past Medical History:  Diagnosis Date  . A-fib ([facility] May 2013   s/p TEE/cardioversion June 2013  . Anxiety   . Aortic root enlargement (HCC)    at least 5cm by CT  . Arthritis   . Atrial thrombus   . Bradycardia   . Bronchitis   . Cancer ([facility] 2016   lymphoma  . Chronic anticoagulation    on coumadin - [place] . Congest[place] heart failure ([place]F) ([facility]  . Diverticulosis   . Dysrhythmia    AFib  . GERD (gastroesophageal reflux disease)   . Hemorrhoids   . HTN (hypertension)   . Pneumonia May 2013  . Pneumonia 2013  . 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 Thrombocytopenia ([place]     Past Surgical History:  Procedure Laterality Date  . CARDIOVERSION  12/30/2011   Procedure: CARDIOVERSION;  Surgeon: BLelon Perla MD;  [place]  Service: Cardiovascular;  Laterality:  N/A;  . CARDIOVERSION  02/16/2012   Procedure: CARDIOVERSION;  Surgeon: Lelon Perla, MD;  Location: [facility] ENDOSCOPY;  Service: Cardiovascular;  Laterality: N/A;  . CATARACT EXTRACTION W/PHACO Left 07/28/2016   Procedure: CATARACT EXTRACTION PHACO AND INTRAOCULAR LENS PLACEMENT LEFT EYE:  CDE: 13.00;  Surgeon: Tonny Branch, MD;  Location: [facility];  Service: Ophthalmology;  Laterality: Left;  . ENDOSCOPIC RETROGRADE CHOLANGIOPANCREATOGRAPHY (ERCP) WITH PROPOFOL N/A 10/24/2016    Procedure: ENDOSCOPIC RETROGRADE CHOLANGIOPANCREATOGRAPHY (ERCP) WITH PROPOFOL;  Surgeon: Doran Stabler, MD;  Location: [facility] ENDOSCOPY;  Service: Endoscopy;  Laterality: N/A;  . ESOPHAGOGASTRODUODENOSCOPY  12/30/2011   Procedure: ESOPHAGOGASTRODUODENOSCOPY (EGD);  Surgeon: Lelon Perla, MD;  Location: [facility] ENDOSCOPY;  Service: Cardiovascular;  Laterality: N/A;  . ESOPHAGOGASTRODUODENOSCOPY  12/30/2011   Procedure: ESOPHAGOGASTRODUODENOSCOPY (EGD);  Surgeon: Lafayette Dragon, MD;  Location: [facility] ENDOSCOPY;  Service: Endoscopy;  Laterality: N/A;  . IR GENERIC HISTORICAL  10/25/2016   IR INT EXT BILIARY DRAIN WITH CHOLANGIOGRAM 10/25/2016 Greggory Keen, MD [facility]  . IR GENERIC HISTORICAL  10/28/2016   IR EXCHANGE BILIARY DRAIN 10/28/2016 Greggory Keen, MD [facility]  . IR GENERIC HISTORICAL  10/28/2016   IR ENDOLUMINAL BX OF BILIARY TREE 10/28/2016 Greggory Keen, MD [facility]  . KNEE ARTHROSCOPY  rt knee  . PROSTATE SURGERY    . TEE WITHOUT CARDIOVERSION  12/30/2011   Procedure: TRANSESOPHAGEAL ECHOCARDIOGRAM (TEE);  Surgeon: Lelon Perla, MD;  Location: [facility] ENDOSCOPY;  Service: Cardiovascular;  Laterality: N/A;  . TEE WITHOUT CARDIOVERSION  02/16/2012   Procedure: TRANSESOPHAGEAL ECHOCARDIOGRAM (TEE);  Surgeon: Lelon Perla, MD;  Location: [facility] ENDOSCOPY;  Service: Cardiovascular;  Laterality: N/A;    Patient Care Team: [name], MD as PCP - General (Family Medicine) Thompson Grayer, MD as Attending Physician (Cardiology) Donita Brooks, MD as Consulting Physician (Internal Medicine) [name], MD as Attending Physician (Family Medicine)  Social History   Social History  . Marital status: Married    Spouse name: N/A  . Number of children: 2  . Years of education: N/A   Occupational History  . retired Retired   Social History Main Topics  . Smoking status: Never Smoker  . Smokeless tobacco: Never Used  . Alcohol use No  . Drug use: No  . Sexual  activity: Not Currently    Birth control/ protection: None   Other Topics Concern  . Not on file   Social History Narrative  . No narrative on file     reports that he has never smoked. He has never used smokeless tobacco. He reports that he does not drink alcohol or use drugs.  Family History  Problem Relation Age of Onset  . Heart disease Mother   . Heart disease Father   . Stomach cancer Brother   . Heart disease Sister   . Heart disease Brother   . Hypertension Sister    Family Status  Relation Status  . Mother Deceased  . Father Deceased  . Brother   . Sister   . Brother   . Sister   . Maternal Grandmother Deceased  . Maternal Grandfather Deceased  . Paternal Grandmother Deceased  . Paternal Grandfather Deceased    Immunization History  Administered Date(s) Administered  . Influenza, High Dose Seasonal PF 06/01/2016    Allergies  Allergen Reactions  . Codeine Other (See Comments)    nervousness  . Simvastatin Other (See Comments)    Muscle pain/hurting  . Vicodin [Hydrocodone-Acetaminophen] Other (See Comments)  nervousness    Medications: Patient's Medications  New Prescriptions   No medications on file  Previous Medications   ALBUTEROL (PROVENTIL HFA;VENTOLIN HFA) 108 (90 BASE) MCG/ACT INHALER    Inhale 2 puffs into the lungs every 6 (six) hours as needed for wheezing or shortness of breath.   CLORAZEPATE (TRANXENE) 3.75 MG TABLET    Take 0.5-1 tablets (1.875-3.75 mg total) by mouth daily as needed for anxiety.   EMOLLIENT (EUCERIN) LOTION    Apply 3-5 mLs topically daily.    FLUTICASONE (FLONASE) 50 MCG/ACT NASAL SPRAY    Place 1 spray into both nostrils daily as needed for allergies or rhinitis.   FOLIC ACID (FOLVITE) 035 MCG TABLET    Take 800 mcg by mouth daily.   FUROSEMIDE (LASIX) 40 MG TABLET    TAKE ONE TABLET BY MOUTH ONCE DAILY   GUAIFENESIN (MUCINEX) 600 MG 12 HR TABLET    Take 600 mg by mouth 2 (two) times daily as needed.    NITROGLYCERIN (NITROSTAT) 0.4 MG SL TABLET    Place 1 tablet (0.4 mg total) under the tongue every 5 (five) minutes x 3 doses as needed for chest pain.   OMEPRAZOLE (PRILOSEC) 20 MG CAPSULE    Take 20 mg by mouth daily before breakfast.    POLYETHYLENE GLYCOL (MIRALAX / GLYCOLAX) PACKET    Take 17 g by mouth at bedtime as needed.    POTASSIUM CHLORIDE SA (K-DUR,KLOR-CON) 20 MEQ TABLET    Take 20 mEq by mouth 2 (two) times daily.  Modified Medications   No medications on file  Discontinued Medications   No medications on file    Review of Systems  Constitutional: Positive for appetite change and fatigue.  Respiratory: Positive for cough.   Gastrointestinal: Positive for abdominal pain.  Musculoskeletal: Positive for gait problem.  Skin: Positive for color change.  Neurological: Positive for weakness.  All other systems reviewed and are negative.   Vitals:   11/03/16 1006  BP: 138/77  Pulse: 95  Resp: 18  Temp: 97.7 F (36.5 C)  TempSrc: Oral  SpO2: 98%  Weight: 183 lb (83 kg)  Height: '5\' 10"'$  (1.778 m)   Body mass index is 26.26 kg/m.  Physical Exam  Constitutional: He is oriented to person, place, and time. He appears well-developed.  Frail appearing in NAD, lying in bed  HENT:  Mouth/Throat: Oropharynx is clear and moist.  Eyes: Pupils are equal, round, and reactive to light. No scleral icterus.  Neck: Neck supple. Carotid bruit is not present. No thyromegaly present.  Cardiovascular: Normal rate and intact distal pulses.  An irregular rhythm present. Exam reveals gallop. Exam reveals no friction rub.   Murmur (1/6 SEM) heard. no distal LE swelling. No calf TTP  Pulmonary/Chest: Effort normal. He has wheezes. He has no rales. He exhibits no tenderness.  Abdominal: Soft. Bowel sounds are normal. He exhibits no distension, no abdominal bruit, no pulsatile midline mass and no mass. There is no hepatomegaly. There is tenderness (biliary drain insertion site but no redness or  d/c). There is no rebound and no guarding.  Biliary external drain DTG with no blood/pus noted in drainage  Lymphadenopathy:    He has no cervical adenopathy.  Neurological: He is alert and oriented to person, place, and time. He has normal reflexes.  Skin: Skin is warm and dry. No rash noted.  (+) jaundice  Psychiatric: He has a normal mood and affect. His behavior is normal. Judgment and thought content normal.  Labs reviewed: Admission on 10/22/2016, Discharged on 11/01/2016  No results displayed because visit has over 200 results.    Office Visit on 10/22/2016  Component Date Value Ref Range Status  . Specific Gravity, UA 10/22/2016 1.015  1.005 - 1.030 Final  . pH, UA 10/22/2016 5.0  5.0 - 7.5 Final  . Color, UA 10/22/2016 Amber* Yellow Final  . Appearance Ur 10/22/2016 Clear  Clear Final  . Leukocytes, UA 10/22/2016 Negative  Negative Final  . Protein, UA 10/22/2016 Negative  Negative/Trace Final  . Glucose, UA 10/22/2016 Negative  Negative Final  . Ketones, UA 10/22/2016 Trace* Negative Final  . RBC, UA 10/22/2016 Negative  Negative Final  . Bilirubin, UA 10/22/2016 Positive* Negative Final  . Urobilinogen, Ur 10/22/2016 0.2  0.2 - 1.0 mg/dL Final  . Nitrite, UA 10/22/2016 Negative  Negative Final  . Glucose 10/22/2016 95  65 - 99 mg/dL Final  . BUN 10/22/2016 21  8 - 27 mg/dL Final  . Creatinine, Ser 10/22/2016 1.15  0.76 - 1.27 mg/dL Final  . GFR calc non Af Amer 10/22/2016 59* >59 Final  . GFR calc Af Amer 10/22/2016 68  >59 Final  . BUN/Creatinine Ratio 10/22/2016 18  10 - 24 Final  . Sodium 10/22/2016 130* 134 - 144 mmol/L Final  . Potassium 10/22/2016 5.1  3.5 - 5.2 mmol/L Final  . Chloride 10/22/2016 100  96 - 106 mmol/L Final  . CO2 10/22/2016 25  18 - 29 mmol/L Final  . Calcium 10/22/2016 8.6  8.6 - 10.2 mg/dL Final  . Total Protein 10/22/2016 5.3* 6.0 - 8.5 g/dL Final  . Albumin 10/22/2016 3.4* 3.5 - 4.7 g/dL Final  . Bilirubin Total 10/22/2016 8.5* 0.0 -  1.2 mg/dL Final  . Bilirubin, Direct 10/22/2016 5.49* 0.00 - 0.40 mg/dL Final  . Alkaline Phosphatase 10/22/2016 562* 39 - 117 IU/L Final  . AST 10/22/2016 113* 0 - 40 IU/L Final  . ALT 10/22/2016 144* 0 - 44 IU/L Final  . WBC 10/22/2016 7.8  3.4 - 10.8 x10E3/uL Final  . RBC 10/22/2016 3.73* 4.14 - 5.80 x10E6/uL Final  . Hemoglobin 10/22/2016 12.0* 13.0 - 17.7 g/dL Final  . Hematocrit 10/22/2016 33.8* 37.5 - 51.0 % Final  . MCV 10/22/2016 91  79 - 97 fL Final  . MCH 10/22/2016 32.2  26.6 - 33.0 pg Final  . MCHC 10/22/2016 35.5  31.5 - 35.7 g/dL Final  . RDW 10/22/2016 14.3  12.3 - 15.4 % Final  . Platelets 10/22/2016 146* 150 - 379 x10E3/uL Final  . Neutrophils 10/22/2016 67  Not Estab. % Final  . Lymphs 10/22/2016 17  Not Estab. % Final  . Monocytes 10/22/2016 16  Not Estab. % Final  . Eos 10/22/2016 0  Not Estab. % Final  . Basos 10/22/2016 0  Not Estab. % Final  . Neutrophils Absolute 10/22/2016 5.2  1.4 - 7.0 x10E3/uL Final  . Lymphocytes Absolute 10/22/2016 1.3  0.7 - 3.1 x10E3/uL Final  . Monocytes Absolute 10/22/2016 1.3* 0.1 - 0.9 x10E3/uL Final  . EOS (ABSOLUTE) 10/22/2016 0.0  0.0 - 0.4 x10E3/uL Final  . Basophils Absolute 10/22/2016 0.0  0.0 - 0.2 x10E3/uL Final  Anti-coag visit on 10/21/2016  Component Date Value Ref Range Status  . INR 10/21/2016 >8.0* 0.9 - 1.1 Final  . Prothrombin Time 10/21/2016 >96.0  sec Final   Comment: Differences in reagents, instruments, and pre-analytical variables can affect prothrombin time results.  These factors should be considered when comparing different prothrombin  time test methods. Please Note: This test should not be used to monitor persons on heparin therapy.   . INR 10/21/2016 >8.0* 0.9 - 1.1 Final  . Prothrombin Time 10/21/2016 >96.0  sec Final   Comment: Differences in reagents, instruments, and pre-analytical variables can affect prothrombin time results.  These factors should be considered when comparing different  prothrombin time test methods. Please Note: This test should not be used to monitor persons on heparin therapy.   . INR 10/21/2016 11.8* 0.8 - 1.2 Final   Comment: Abnormal result has been verified by repeat testing. If inconsistent with clinical condition, please resubmit another specimen for verification and to rule out specimen handling problems for which this test is very sensitive. Reference interval is for non-anticoagulated patients. Suggested INR therapeutic range for Vitamin K antagonist therapy:    Standard Dose (moderate intensity                   therapeutic range):       2.0 - 3.0    Higher intensity therapeutic range       2.5 - 3.5   . Prothrombin Time 10/21/2016 105.4* 9.1 - 12.0 sec Final  . WBC 10/21/2016 6.0  3.4 - 10.8 x10E3/uL Final  . RBC 10/21/2016 3.69* 4.14 - 5.80 x10E6/uL Final  . Hemoglobin 10/21/2016 11.3* 13.0 - 17.7 g/dL Final  . Hematocrit 10/21/2016 35.9* 37.5 - 51.0 % Final  . MCV 10/21/2016 97  79 - 97 fL Final  . MCH 10/21/2016 30.6  26.6 - 33.0 pg Final  . MCHC 10/21/2016 31.5  31.5 - 35.7 g/dL Final  . RDW 10/21/2016 14.6  12.3 - 15.4 % Final  . Platelets 10/21/2016 166  150 - 379 x10E3/uL Final  . Neutrophils 10/21/2016 67  Not Estab. % Final  . Lymphs 10/21/2016 20  Not Estab. % Final  . Monocytes 10/21/2016 11  Not Estab. % Final  . Eos 10/21/2016 1  Not Estab. % Final  . Basos 10/21/2016 0  Not Estab. % Final  . Neutrophils Absolute 10/21/2016 4.0  1.4 - 7.0 x10E3/uL Final  . Lymphocytes Absolute 10/21/2016 1.2  0.7 - 3.1 x10E3/uL Final  . Monocytes Absolute 10/21/2016 0.7  0.1 - 0.9 x10E3/uL Final  . EOS (ABSOLUTE) 10/21/2016 0.1  0.0 - 0.4 x10E3/uL Final  . Basophils Absolute 10/21/2016 0.0  0.0 - 0.2 x10E3/uL Final  . Immature Granulocytes 10/21/2016 1  Not Estab. % Final  . Immature Grans (Abs) 10/21/2016 0.1  0.0 - 0.1 x10E3/uL Final  Office Visit on 10/09/2016  Component Date Value Ref Range Status  . Influenza A 10/09/2016  Positive* Negative Final  . Influenza B 10/09/2016 Negative  Negative Final   Comment: If the test is negative for the presence of influenza A or influenza B antigen, infection due to influenza cannot be ruled-out because the antigen present in the sample may be below the detection limit of the test. It is recommended that these results be confirmed by viral culture or an FDA-cleared influenza A and B molecular assay.     Ct Chest Wo Contrast  Result Date: 10/31/2016 CLINICAL DATA:  Cholangiocarcinoma.  Metastatic disease evaluation. EXAM: CT CHEST WITHOUT CONTRAST TECHNIQUE: Multidetector CT imaging of the chest was performed following the standard protocol without IV contrast. COMPARISON:  Chest CT 09/17/2015, additional priors FINDINGS: Cardiovascular: Unchanged known dilatation of the aortic root, maximal dimension 5.6 cm. Unchanged diffuse disc aneurysmal dilatation of the ascending aorta, maximal dimension 4.6 cm, allowing  for differences in caliper placement. Scattered mild atherosclerosis throughout. There are coronary artery calcifications. Mediastinum/Nodes: No mediastinal adenopathy. No bulky hilar adenopathy, decreased sensitivity given lack of IV contrast. There is a 7 mm soft tissue nodule in the subcutaneous tissues superficial to the pectoralis muscle image 28 series 201. Minimal pericardial fluid, small effusion with fluid in the pericardial recesses. The esophagus is decompressed. Heterogeneously enlarged left lobe of the thyroid gland with mild mass effect on the trachea, stable in CT appearance from prior. Lungs/Pleura: Trace right pleural effusion and right lower lobe atelectasis. The 4 mm right paramediastinal nodule image 71 series 205 is unchanged from prior exam and likely pleural thickening. Unchanged 3 mm are left lower lobe pulmonary nodule image 101 series 205. No new pulmonary nodules. Minimal intraluminal debris within the dependent right mainstem bronchus. No focal airspace  disease to suggest pneumonia. No pulmonary edema. Upper Abdomen: Biliary drain in place. Small associated pneumobilia. Small hiatal hernia. No adrenal nodule. Atherosclerosis of the upper abdominal aorta. Right renal cyst. Musculoskeletal: Diffuse bony under mineralization. No blastic or destructive lytic lesions. IMPRESSION: 1. Small 7 mm subcutaneous nodule in the anterior chest wall, nonspecific, but new from chest CT January 2017. This may be a subcutaneous lymph node or metastatic deposit, versus incidental soft tissue nodule. 2. There is otherwise no evidence of metastatic disease in the thorax. 3. Stable aneurysmal dilatation of the aortic root and ascending aorta. 4. Unchanged 3 mm left lower lobe pulmonary nodule dating back to 2013, benign. 5. Stable CT appearance of left thyroid nodule. Electronically Signed   By: Jeb Levering M.D.   On: 10/31/2016 19:41   Ct Abdomen Pelvis W Contrast  Result Date: 10/22/2016 CLINICAL DATA:  83 y/o  M; elevated liver enzymes and jaundice. EXAM: CT ABDOMEN AND PELVIS WITH CONTRAST TECHNIQUE: Multidetector CT imaging of the abdomen and pelvis was performed using the standard protocol following bolus administration of intravenous contrast. CONTRAST:  171m ISOVUE-300 IOPAMIDOL (ISOVUE-300) INJECTION 61% COMPARISON:  08/16/2012 CT of the abdomen and pelvis. FINDINGS: Lower chest: Moderate cardiomegaly. Aortic valvular calcification. No acute abnormality. Hepatobiliary: Moderate intrahepatic biliary ductal dilatation, mild distention of the gallbladder, a distention of the common bile duct up to 19 mm (series 5, image 69). The common bile duct is abruptly cut off as it enters the head of the pancreas with soft tissue filling the lumen. Two small gallstones within the gallbladder. Pancreas: No main duct dilatation of the pancreas. No inflammatory change. Spleen: Normal in size without focal abnormality. Adrenals/Urinary Tract: Lucencies in the kidneys the largest  arising exophytic from the upper pole of right kidney measuring 41 mm, likely representing cysts. Normal adrenal glands. No urinary stone disease or obstructive uropathy. Normal bladder. Stomach/Bowel: Stomach is within normal limits. Appendix appears normal. No evidence of bowel wall thickening, distention, or inflammatory changes. Vascular/Lymphatic: Aortic atherosclerosis. No enlarged abdominal or pelvic lymph nodes. Reproductive: Mild prostate hypertrophy and calcifications. Other: No ascites. Musculoskeletal: No acute osseous abnormality. Multilevel degenerative changes of the spine with discogenic and prominent lower lumbar facet arthropathy. IMPRESSION: 1. Moderate intrahepatic biliary ductal dilatation, mild distention of the gallbladder, a distention of the common bile duct up to 19 mm. The common bile duct is abruptly cut off as it enters the head of the pancreas with soft tissue filling the lumen. 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 MRI/MRCP is recommended with and without intravenous contrast for further evaluation. 2. Moderate cardiomegaly. 3. Cholelithiasis. 4. Aortic  atherosclerosis. 5. Prostate hypertrophy. Electronically Signed   By: Kristine Garbe M.D.   On: 10/22/2016 22:29   Mr 3d Recon At Scanner  Result Date: 10/23/2016 CLINICAL DATA:  83 year old male with history of progressively worsening jaundice. No associated fever, abdominal pain, nausea, emesis, constipation, melena or hematochezia. Biliary tract ductal dilatation noted on recent CT examination. EXAM: MRI ABDOMEN WITHOUT AND WITH CONTRAST (INCLUDING MRCP) TECHNIQUE: Multiplanar multisequence MR imaging of the abdomen was performed both before and after the administration of intravenous contrast. Heavily T2-weighted images of the biliary and pancreatic ducts were obtained, and three-dimensional MRCP images were rendered by post processing. CONTRAST:  61m MULTIHANCE GADOBENATE  DIMEGLUMINE 529 MG/ML IV SOLN COMPARISON:  No priors.  CT of the abdomen and pelvis 10/22/2016. FINDINGS: Lower chest: Cardiomegaly. Hepatobiliary: No discrete cystic or solid hepatic lesions. MRCP images demonstrate moderate intra and extrahepatic biliary ductal dilatation which abruptly terminates at or just beyond the junction of the common hepatic and cystic ducts in the proximal common bile duct. The common hepatic duct measures up to 14 mm in diameter. The area of obstruction on MRCP images is poorly demonstrated secondary to central loss of signal intensity on MRCP images. Unfortunately, this region is also poorly evaluated on postcontrast imaging secondary to patient motion. No discrete mass is identified in this region. Gallbladder is moderately distended. Several tiny filling defects are noted in the gallbladder, compatible with tiny gallstones. Gallbladder wall does not appear thickened and there is no pericholecystic fluid or surrounding inflammatory changes to suggest an acute cholecystitis at this time. Pancreas: No discrete pancreatic mass identified in the pancreatic head to account for the common bile duct obstruction. There are some tiny T1 hypointense, T2 hyperintense, nonenhancing lesions in the distal body and tail of the pancreas, measuring up to 1 cm in diameter, presumably tiny pancreatic pseudocysts. No pancreatic ductal dilatation noted on MRCP images. No peripancreatic inflammatory changes. Spleen:  Unremarkable. Adrenals/Urinary Tract: Well-defined T1 hypointense, T2 hyperintense, nonenhancing lesions are noted in both kidneys, the largest of which is exophytic extending from the upper pole of the right kidney measuring up to 5.4 cm in diameter. No hydroureteronephrosis in the visualized portions of the abdomen. Bilateral adrenal glands are normal in appearance. Stomach/Bowel: Visualized portions are unremarkable. Vascular/Lymphatic: Aortic atherosclerosis, without definite aneurysm in  the abdominal vasculature. No lymphadenopathy noted in the abdomen. Other: No significant volume of ascites noted in the visualized portions of the peritoneal cavity. Musculoskeletal: No aggressive osseous lesions are noted in the visualized portions of the skeleton. IMPRESSION: 1. Abrupt cut off of the common bile duct at or immediately beyond the junction of the common hepatic duct and cystic duct with moderate proximal biliary dilatation. The exact source of obstruction is unclear on today's examination, with differential considerations including an obstructing ductal stone (which is simply not visualized secondary to technical limitations on today's examination), ductal adenocarcinoma, or obstructing pancreatic head lesion (which is not visualized on today's examination secondary to technical limitations). Further evaluation with ERCP is recommended if clinically appropriate. 2. Cholelithiasis without evidence of acute cholecystitis at this time. Electronically Signed   By: DVinnie LangtonM.D.   On: 10/23/2016 14:37   Dg C-arm 1-60 Min-no Report  Result Date: 10/24/2016 Fluoroscopy was utilized by the requesting physician.  No radiographic interpretation.   Mr Abdomen Mrcp WMoise BoringContast  Result Date: 10/23/2016 CLINICAL DATA:  83year old male with history of progressively worsening jaundice. No associated fever, abdominal pain, nausea, emesis, constipation, melena or  hematochezia. Biliary tract ductal dilatation noted on recent CT examination. EXAM: MRI ABDOMEN WITHOUT AND WITH CONTRAST (INCLUDING MRCP) TECHNIQUE: Multiplanar multisequence MR imaging of the abdomen was performed both before and after the administration of intravenous contrast. Heavily T2-weighted images of the biliary and pancreatic ducts were obtained, and three-dimensional MRCP images were rendered by post processing. CONTRAST:  54m MULTIHANCE GADOBENATE DIMEGLUMINE 529 MG/ML IV SOLN COMPARISON:  No priors.  CT of the abdomen and  pelvis 10/22/2016. FINDINGS: Lower chest: Cardiomegaly. Hepatobiliary: No discrete cystic or solid hepatic lesions. MRCP images demonstrate moderate intra and extrahepatic biliary ductal dilatation which abruptly terminates at or just beyond the junction of the common hepatic and cystic ducts in the proximal common bile duct. The common hepatic duct measures up to 14 mm in diameter. The area of obstruction on MRCP images is poorly demonstrated secondary to central loss of signal intensity on MRCP images. Unfortunately, this region is also poorly evaluated on postcontrast imaging secondary to patient motion. No discrete mass is identified in this region. Gallbladder is moderately distended. Several tiny filling defects are noted in the gallbladder, compatible with tiny gallstones. Gallbladder wall does not appear thickened and there is no pericholecystic fluid or surrounding inflammatory changes to suggest an acute cholecystitis at this time. Pancreas: No discrete pancreatic mass identified in the pancreatic head to account for the common bile duct obstruction. There are some tiny T1 hypointense, T2 hyperintense, nonenhancing lesions in the distal body and tail of the pancreas, measuring up to 1 cm in diameter, presumably tiny pancreatic pseudocysts. No pancreatic ductal dilatation noted on MRCP images. No peripancreatic inflammatory changes. Spleen:  Unremarkable. Adrenals/Urinary Tract: Well-defined T1 hypointense, T2 hyperintense, nonenhancing lesions are noted in both kidneys, the largest of which is exophytic extending from the upper pole of the right kidney measuring up to 5.4 cm in diameter. No hydroureteronephrosis in the visualized portions of the abdomen. Bilateral adrenal glands are normal in appearance. Stomach/Bowel: Visualized portions are unremarkable. Vascular/Lymphatic: Aortic atherosclerosis, without definite aneurysm in the abdominal vasculature. No lymphadenopathy noted in the abdomen. Other: No  significant volume of ascites noted in the visualized portions of the peritoneal cavity. Musculoskeletal: No aggressive osseous lesions are noted in the visualized portions of the skeleton. IMPRESSION: 1. Abrupt cut off of the common bile duct at or immediately beyond the junction of the common hepatic duct and cystic duct with moderate proximal biliary dilatation. The exact source of obstruction is unclear on today's examination, with differential considerations including an obstructing ductal stone (which is simply not visualized secondary to technical limitations on today's examination), ductal adenocarcinoma, or obstructing pancreatic head lesion (which is not visualized on today's examination secondary to technical limitations). Further evaluation with ERCP is recommended if clinically appropriate. 2. Cholelithiasis without evidence of acute cholecystitis at this time. Electronically Signed   By: DVinnie LangtonM.D.   On: 10/23/2016 14:37   Ir Int ELianne CureBiliary Drain With Cholangiogram  Result Date: 10/25/2016 INDICATION: Obstructive jaundice, distal common bile duct obstruction concerning for malignant obstruction. Failed ERCP. EXAM: IR INT-EXT BILIARY DRAIN W/ CHOLANGIOGRAM MEDICATIONS: Unasyn 3 g; The antibiotic was administered within an appropriate time frame prior to the initiation of the procedure. ANESTHESIA/SEDATION: Moderate (conscious) sedation was employed during this procedure. A total of Versed 1.0 mg and Fentanyl 37.5 mcg was administered intravenously. Moderate Sedation Time: 20 minutes. The patient's level of consciousness and vital signs were monitored continuously by radiology nursing throughout the procedure under my direct supervision. FLUOROSCOPY TIME:  Fluoroscopy Time: 2 minutes 30 seconds (11 mGy). COMPLICATIONS: None immediate. PROCEDURE: Informed written consent was obtained from the patient after a thorough discussion of the procedural risks, benefits and alternatives. All  questions were addressed. Maximal Sterile Barrier Technique was utilized including caps, mask, sterile gowns, sterile gloves, sterile drape, hand hygiene and skin antiseptic. A timeout was performed prior to the initiation of the procedure. Previous imaging reviewed. Preliminary ultrasound performed. Dilated peripheral right hepatic ducts demonstrated. Overlying skin marked. Under sterile conditions and local anesthesia, ultrasound percutaneous needle access performed of a peripheral right hepatic duct. There was return of bile. Contrast injection performed for initial cholangiogram. Diffuse intrahepatic biliary dilatation evident. 018 guidewire advanced centrally followed by the Accustick dilator set. Contrast injection demonstrates diffuse biliary dilatation with a severe stricture/ obstruction of the mid CBD. Below the stricture, the bile duct is nondilated and drains into the duodenum. Catheter and guidewire access were manipulated through the common bile duct severe stricture. Catheter was advanced into the duodenum. Contrast injection confirms position in the duodenum. Amplatz guidewire exchange performed. 20 [place] internal external biliary drain and successfully advanced over the guidewire. Retention loop formed in the duodenum. Contrast injection confirms adequate drainage of the biliary tree. Filling defects within the biliary tree at the conclusion the procedure compatible with blood related to the intervention but no active bleeding appreciated. Catheter secured with a Prolene suture and connected to external gravity drainage bag. Sterile dressing applied. Patient tolerated the procedure well. No immediate complication. IMPRESSION: Successful ultrasound and fluoroscopic 10 French right internal external biliary drain to relieve the biliary obstruction secondary to the mid CBD severe stricture / obstruction. Again, this is concerning for a malignant process. Electronically Signed   By: Jerilynn Mages.  Shick M.D.    On: 10/25/2016 11:24   Ir Exchange Biliary Drain  Result Date: 10/28/2016 INDICATION: Common bile duct obstruction, obstructive jaundice EXAM: IR EXCHANGE BILARY DRAIN; IR ENDOLUMINAL BIOPSY OF BILIARY TREE MEDICATIONS: Patient is already receiving antibiotics as an inpatient. ANESTHESIA/SEDATION: Moderate (conscious) sedation was employed during this procedure. A total of Versed 2.0 mg and Fentanyl 100 mcg was administered intravenously. Moderate Sedation Time: 12 minutes. The patient's level of consciousness and vital signs were monitored continuously by radiology nursing throughout the procedure under my direct supervision. FLUOROSCOPY TIME:  Fluoroscopy Time: 4 minutes 0 seconds (23 mGy). COMPLICATIONS: None immediate. PROCEDURE: Informed written consent was obtained from the patient after a thorough discussion of the procedural risks, benefits and alternatives. All questions were addressed. Maximal Sterile Barrier Technique was utilized including caps, mask, sterile gowns, sterile gloves, sterile drape, hand hygiene and skin antiseptic. A timeout was performed prior to the initiation of the procedure. Under sterile conditions and local anesthesia, the existing right internal external 10 [place] biliary drain was removed over an Amplatz guidewire. 8 French sheath inserted into the common bile duct across the CBD obstruction. Endoluminal brush biopsy performed of the CBD obstruction 3 times. Samples placed in saline. Over the guidewire, a 10 [place] internal external biliary drain was replaced. Retention loop formed in the duodenum. Contrast injection confirms position. There is adequate drainage of the biliary tree. CBD obstruction persist. IMPRESSION: Successful endoluminal brush biopsy of the common bile duct obstruction. Replacement of the 10 [place] internal external right biliary drain catheter. Electronically Signed   By: Jerilynn Mages.  Shick M.D.   On: 10/28/2016 14:51   Ir Endoluminal Bx Of Biliary  Tree  Result Date: 10/28/2016 INDICATION: Common bile duct obstruction, obstructive jaundice EXAM: IR EXCHANGE  BILARY DRAIN; IR ENDOLUMINAL BIOPSY OF BILIARY TREE MEDICATIONS: Patient is already receiving antibiotics as an inpatient. ANESTHESIA/SEDATION: Moderate (conscious) sedation was employed during this procedure. A total of Versed 2.0 mg and Fentanyl 100 mcg was administered intravenously. Moderate Sedation Time: 12 minutes. The patient's level of consciousness and vital signs were monitored continuously by radiology nursing throughout the procedure under my direct supervision. FLUOROSCOPY TIME:  Fluoroscopy Time: 4 minutes 0 seconds (23 mGy). COMPLICATIONS: None immediate. PROCEDURE: Informed written consent was obtained from the patient after a thorough discussion of the procedural risks, benefits and alternatives. All questions were addressed. Maximal Sterile Barrier Technique was utilized including caps, mask, sterile gowns, sterile gloves, sterile drape, hand hygiene and skin antiseptic. A timeout was performed prior to the initiation of the procedure. Under sterile conditions and local anesthesia, the existing right internal external 10 [place] biliary drain was removed over an Amplatz guidewire. 8 French sheath inserted into the common bile duct across the CBD obstruction. Endoluminal brush biopsy performed of the CBD obstruction 3 times. Samples placed in saline. Over the guidewire, a 10 [place] internal external biliary drain was replaced. Retention loop formed in the duodenum. Contrast injection confirms position. There is adequate drainage of the biliary tree. CBD obstruction persist. IMPRESSION: Successful endoluminal brush biopsy of the common bile duct obstruction. Replacement of the 10 [place] internal external right biliary drain catheter. Electronically Signed   By: Jerilynn Mages.  Shick M.D.   On: 10/28/2016 14:51     Assessment/Plan   ICD-9-CM ICD-10-CM   1. Obstructive jaundice 576.8 K83.8    due  to #2  2. 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 Paroxysmal atrial fibrillation (HCC) 427.31 I48.0   5. Chronic systolic heart failure (HCC) 428.22 I50.22   6. Gastroesophageal reflux disease with esophagitis 530.11 K21.0   7. Thrombocytopenia (HCC) 287.5 D69.6   8. Anxiety 300.00 F41.9      f/u with IR in 1 week for mx of drain  Check BMP and CBC  f/u with oncology as scheduled for mx cholangioCA  Cont current meds as ordered  PT/OT/ST as ordered  Drain care as indicated  Wound care as ordered  GOAL: short term rehab and d/c home when medically appropriate. Communicated with pt and nursing.  Will follow  Nitya Cauthon S. Perlie Gold  [facility] [address] [place], [place] [address] [phone] Cell (Monday-Friday 8 AM - 5 PM) [phone] After 5 PM and follow prompts

## 2016-11-04 LAB — CBC AND DIFFERENTIAL
HCT: 38 % — AB (ref 41–53)
Hemoglobin: 12.5 g/dL — AB (ref 13.5–17.5)
Neutrophils Absolute: 7 /uL
Platelets: 161 10*3/uL (ref 150–399)
WBC: 11.4 10^3/mL

## 2016-11-04 LAB — BASIC METABOLIC PANEL
BUN: 45 mg/dL — AB (ref 4–21)
CREATININE: 1.2 mg/dL (ref 0.6–1.3)
Glucose: 175 mg/dL
Potassium: 4.5 mmol/L (ref 3.4–5.3)
SODIUM: 139 mmol/L (ref 137–147)

## 2016-11-04 LAB — HEPATIC FUNCTION PANEL
ALK PHOS: 294 U/L — AB (ref 25–125)
ALT: 93 U/L — AB (ref 10–40)
AST: 79 U/L — AB (ref 14–40)
Bilirubin, Total: 11.3 mg/dL

## 2016-11-04 LAB — MULTIPLE MYELOMA PANEL, SERUM
ALBUMIN SERPL ELPH-MCNC: 2.7 g/dL — AB (ref 2.9–4.4)
Albumin/Glob SerPl: 1 (ref 0.7–1.7)
Alpha 1: 0.4 g/dL (ref 0.0–0.4)
Alpha2 Glob SerPl Elph-Mcnc: 0.8 g/dL (ref 0.4–1.0)
B-Globulin SerPl Elph-Mcnc: 0.9 g/dL (ref 0.7–1.3)
Gamma Glob SerPl Elph-Mcnc: 0.8 g/dL (ref 0.4–1.8)
Globulin, Total: 3 g/dL (ref 2.2–3.9)
IGG (IMMUNOGLOBIN G), SERUM: 437 mg/dL — AB (ref 700–1600)
IGM, SERUM: 741 mg/dL — AB (ref 15–143)
IgA: 50 mg/dL — ABNORMAL LOW (ref 61–437)
M PROTEIN SERPL ELPH-MCNC: 0.4 g/dL — AB
TOTAL PROTEIN ELP: 5.7 g/dL — AB (ref 6.0–8.5)

## 2016-11-05 ENCOUNTER — Telehealth: Payer: Self-pay | Admitting: Hematology

## 2016-11-05 ENCOUNTER — Other Ambulatory Visit: Payer: Self-pay | Admitting: *Deleted

## 2016-11-05 DIAGNOSIS — C221 Intrahepatic bile duct carcinoma: Secondary | ICD-10-CM

## 2016-11-05 NOTE — Telephone Encounter (Signed)
An appt has been scheduled for the pt to see Dr. Irene Limbo for a hosp f/u on 3/12 at 12pm. Appt was made w/the unit director at [facility]. Aware to arrive 30 minutes early. Voiced understanding and agreed to the appt.

## 2016-11-10 ENCOUNTER — Telehealth: Payer: Self-pay | Admitting: Hematology

## 2016-11-10 ENCOUNTER — Encounter: Payer: Self-pay | Admitting: Hematology

## 2016-11-10 ENCOUNTER — Ambulatory Visit (HOSPITAL_BASED_OUTPATIENT_CLINIC_OR_DEPARTMENT_OTHER): Payer: PPO | Admitting: Hematology

## 2016-11-10 ENCOUNTER — Other Ambulatory Visit (HOSPITAL_BASED_OUTPATIENT_CLINIC_OR_DEPARTMENT_OTHER): Payer: PPO

## 2016-11-10 VITALS — BP 100/68 | HR 100 | Temp 97.8°F | Resp 18 | Ht 70.0 in | Wt 182.8 lb

## 2016-11-10 DIAGNOSIS — C221 Intrahepatic bile duct carcinoma: Secondary | ICD-10-CM

## 2016-11-10 LAB — BASIC METABOLIC PANEL
BUN: 50 mg/dL — AB (ref 4–21)
Creatinine: 1.4 mg/dL — AB (ref 0.6–1.3)
GLUCOSE: 104 mg/dL
Potassium: 4.5 mmol/L (ref 3.4–5.3)
Sodium: 137 mmol/L (ref 137–147)

## 2016-11-10 LAB — CBC & DIFF AND RETIC
BASO%: 0.4 % (ref 0.0–2.0)
Basophils Absolute: 0 10*3/uL (ref 0.0–0.1)
EOS ABS: 0.1 10*3/uL (ref 0.0–0.5)
EOS%: 1 % (ref 0.0–7.0)
HCT: 37.6 % — ABNORMAL LOW (ref 38.4–49.9)
HGB: 12.6 g/dL — ABNORMAL LOW (ref 13.0–17.1)
IMMATURE RETIC FRACT: 3 % (ref 3.00–10.60)
LYMPH%: 25.2 % (ref 14.0–49.0)
MCH: 32.6 pg (ref 27.2–33.4)
MCHC: 33.5 g/dL (ref 32.0–36.0)
MCV: 97.4 fL (ref 79.3–98.0)
MONO#: 1 10*3/uL — AB (ref 0.1–0.9)
MONO%: 13.5 % (ref 0.0–14.0)
NEUT%: 59.9 % (ref 39.0–75.0)
NEUTROS ABS: 4.2 10*3/uL (ref 1.5–6.5)
Platelets: 167 10*3/uL (ref 140–400)
RBC: 3.86 10*6/uL — AB (ref 4.20–5.82)
RDW: 15.8 % — AB (ref 11.0–14.6)
RETIC %: 2.89 % — AB (ref 0.80–1.80)
Retic Ct Abs: 111.55 10*3/uL — ABNORMAL HIGH (ref 34.80–93.90)
WBC: 7.1 10*3/uL (ref 4.0–10.3)
lymph#: 1.8 10*3/uL (ref 0.9–3.3)

## 2016-11-10 LAB — COMPREHENSIVE METABOLIC PANEL
ALT: 122 U/L — ABNORMAL HIGH (ref 0–55)
AST: 87 U/L — AB (ref 5–34)
Albumin: 3.1 g/dL — ABNORMAL LOW (ref 3.5–5.0)
Alkaline Phosphatase: 282 U/L — ABNORMAL HIGH (ref 40–150)
Anion Gap: 11 mEq/L (ref 3–11)
BUN: 48.8 mg/dL — AB (ref 7.0–26.0)
CO2: 20 meq/L — AB (ref 22–29)
Calcium: 9.1 mg/dL (ref 8.4–10.4)
Chloride: 106 mEq/L (ref 98–109)
Creatinine: 1.6 mg/dL — ABNORMAL HIGH (ref 0.7–1.3)
EGFR: 40 mL/min/{1.73_m2} — AB (ref >90)
GLUCOSE: 106 mg/dL (ref 70–140)
POTASSIUM: 4.5 meq/L (ref 3.5–5.1)
SODIUM: 137 meq/L (ref 136–145)
Total Bilirubin: 8.05 mg/dL (ref 0.20–1.20)
Total Protein: 6.7 g/dL (ref 6.4–8.3)

## 2016-11-10 NOTE — Telephone Encounter (Signed)
Appointments scheduled per 3/12 LOS. Patient alreadu scheduled on 3/14 @ 1145 AM with Dr. Barry Dienes per [facility] scheduler. Follow up Appointment scheduled for 3/26... OK per [facility]. Patient given AVS report and calendars with future scheduled appointments.

## 2016-11-11 ENCOUNTER — Ambulatory Visit
Admission: RE | Admit: 2016-11-11 | Discharge: 2016-11-11 | Disposition: A | Discharge status: Home / Self Care | Payer: PPO | Source: Ambulatory Visit | Attending: General Surgery | Admitting: General Surgery

## 2016-11-11 ENCOUNTER — Other Ambulatory Visit: Payer: Self-pay | Admitting: *Deleted

## 2016-11-11 DIAGNOSIS — K838 Other specified diseases of biliary tract: Secondary | ICD-10-CM | POA: Diagnosis not present

## 2016-11-11 DIAGNOSIS — Z4803 Encounter for change or removal of drains: Secondary | ICD-10-CM | POA: Diagnosis not present

## 2016-11-11 DIAGNOSIS — K831 Obstruction of bile duct: Secondary | ICD-10-CM

## 2016-11-11 HISTORY — PX: IR RADIOLOGIST EVAL & MGMT: IMG5224

## 2016-11-11 MED ORDER — IOPAMIDOL (ISOVUE-300) INJECTION 61%
100.0000 mL | Freq: Once | INTRAVENOUS | Status: AC | PRN
  Administered 2016-11-11: 100 mL via INTRAVENOUS

## 2016-11-11 NOTE — Progress Notes (Signed)
Chief Complaint: Obstructive Jaundice S/P Biliary drain by Dr. Annamaria Boots on 10/25/2016 Endoluminal biopsy and Replacement by Dr. Annamaria Boots on 10/28/2016  Referring Physician(s): Dr. Wilfrid Lund  Supervising Physician: Sandi Mariscal  History of Present Illness: Jimmy Myers is a 83 y.o. male who was admitted to [facility] 10/22/16 with hyperbilirubinemia and jaundice.   He was sent to the ED by his PCP due to jaundice. Bilirubin was found to be 8.5.   CT scan showed moderate intrahepatic biliary ductal dilation, mild distention of the gallbladder, distention of the CBD suspicious for CBD or pancreatic carcinoma.   MRCP was then performed and showed an abrupt cut off of the common bile duct at or immediately beyond the junction of the common hepatic duct and cystic duct with moderate proximal biliary dilatation.   ERCP was attempted 2/23 but unsuccessful due to severe biliary stricture.   He underwent PTC with I/E drain 10/25/16 by Dr. Annamaria Boots and then brushings and replacement of drain on 10/28/2016.  CEA 3.8 CA 19-9 1581 Cytology from brushings c/w adenocarcinoma  He is here today for follow up of his drain.  Total bili drawn yesterday is 8.5 which is down from 10  He only c/o a leak in the bag. He has no abdominal pain, no N/V.  He reports " a lot" of output and states he empties it a couple of times a day.  Past Medical History:  Diagnosis Date  . A-fib [facility]) May 2013   s/p TEE/cardioversion June 2013  . Anxiety   . Aortic root enlargement (HCC)    at least 5cm by CT  . Arthritis   . Atrial thrombus   . Bradycardia   . Bronchitis   . Cancer ([place]) 2016   lymphoma  . Chronic anticoagulation    on coumadin - checked in [place]  . Congestive heart failure (CHF) ([place]) 2013  . Diverticulosis   . Dysrhythmia    AFib  . GERD (gastroesophageal reflux disease)   . Hemorrhoids   . HTN (hypertension)   . Pneumonia May 2013  . Pneumonia 2013  . 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 Thrombocytopenia ([place])     Past Surgical History:  Procedure Laterality Date  . CARDIOVERSION  12/30/2011   Procedure: CARDIOVERSION;  Surgeon: Lelon Perla, MD;  Location: [facility] ENDOSCOPY;  Service: Cardiovascular;  Laterality: N/A;  . CARDIOVERSION  02/16/2012   Procedure: CARDIOVERSION;  Surgeon: Lelon Perla, MD;  Location: [facility] ENDOSCOPY;  Service: Cardiovascular;  Laterality: N/A;  . CATARACT EXTRACTION W/PHACO Left 07/28/2016   Procedure: CATARACT EXTRACTION PHACO AND INTRAOCULAR LENS PLACEMENT LEFT EYE:  CDE: 13.00;  Surgeon: Tonny Branch, MD;  Location: [facility] ORS;  Service: Ophthalmology;  Laterality: Left;  . ENDOSCOPIC RETROGRADE CHOLANGIOPANCREATOGRAPHY (ERCP) WITH PROPOFOL N/A 10/24/2016   Procedure: ENDOSCOPIC RETROGRADE CHOLANGIOPANCREATOGRAPHY (ERCP) WITH PROPOFOL;  Surgeon: Doran Stabler, MD;  Location: [place] ENDOSCOPY;  Service: Endoscopy;  Laterality: N/A;  . ESOPHAGOGASTRODUODENOSCOPY  12/30/2011   Procedure: ESOPHAGOGASTRODUODENOSCOPY (EGD);  Surgeon: Lelon Perla, MD;  Location: [facility] ENDOSCOPY;  Service: Cardiovascular;  Laterality: N/A;  . ESOPHAGOGASTRODUODENOSCOPY  12/30/2011   Procedure: ESOPHAGOGASTRODUODENOSCOPY (EGD);  Surgeon: Lafayette Dragon, MD;  Location: [facility] ENDOSCOPY;  Service: Endoscopy;  Laterality: N/A;  . IR GENERIC HISTORICAL  10/25/2016   IR INT EXT BILIARY DRAIN WITH  CHOLANGIOGRAM 10/25/2016 Greggory Keen, MD [facility]-INTERV RAD  . IR GENERIC HISTORICAL  10/28/2016   IR EXCHANGE BILIARY DRAIN 10/28/2016 Greggory Keen, MD [facility]INTER[facility]  . IR GENERIC HISTORICAL  10/28/2016   IR ENDOLUMINAL BX OF BILIARY TREE 10/28/2016 Greggory Keen, MD [facility]INTER[facility]  . KNEE ARTHROSCOPY  rt knee  . PROSTATE SURGERY    . TEE WITHOUT CARDIOVERSION  12/30/2011   Procedure: TRANSESOPHAGEAL ECHOCARDIOGRAM (TEE);  Surgeon: Lelon Perla, MD;  Location:  [facility] ENDOSCOPY;  Service: Cardiovascular;  Laterality: N/A;  . TEE WITHOUT CARDIOVERSION  02/16/2012   Procedure: TRANSESOPHAGEAL ECHOCARDIOGRAM (TEE);  Surgeon: Lelon Perla, MD;  Location: [facility] ENDOSCOPY;  Service: Cardiovascular;  Laterality: N/A;    Allergies: Codeine; Simvastatin; and Vicodin [hydrocodone-acetaminophen]  Medications: Prior to Admission medications   Medication Sig Start Date End Date Taking? Authorizing Provider  albuterol (PROVENTIL HFA;VENTOLIN HFA) 108 (90 BASE) MCG/ACT inhaler Inhale 2 puffs into the lungs every 6 (six) hours as needed for wheezing or shortness of breath.    Historical Provider, MD  clorazepate (TRANXENE) 3.75 MG tablet Take 0.5-1 tablets (1.875-3.75 mg total) by mouth daily as needed for anxiety. 11/01/16   Dron Tanna Furry, MD  Emollient (EUCERIN) lotion Apply 3-5 mLs topically daily.     Historical Provider, MD  fluticasone (FLONASE) 50 MCG/ACT nasal spray Place 1 spray into both nostrils daily as needed for allergies or rhinitis.    Historical Provider, MD  folic acid (FOLVITE) 542 MCG tablet Take 800 mcg by mouth daily.    Historical Provider, MD  furosemide (LASIX) 40 MG tablet TAKE ONE TABLET BY MOUTH ONCE DAILY 09/26/16   Burtis Junes, NP  guaiFENesin (MUCINEX) 600 MG 12 hr tablet Take 600 mg by mouth 2 (two) times daily as needed.    Historical Provider, MD  lisinopril (PRINIVIL,ZESTRIL) 5 MG tablet Take 5 mg by mouth daily.    Historical Provider, MD  nitroGLYCERIN (NITROSTAT) 0.4 MG SL tablet Place 1 tablet (0.4 mg total) under the tongue every 5 (five) minutes x 3 doses as needed for chest pain. 05/15/14 10/06/17  Burtis Junes, NP  omeprazole (PRILOSEC) 20 MG capsule Take 20 mg by mouth daily before breakfast.     Historical Provider, MD  polyethylene glycol (MIRALAX / GLYCOLAX) packet Take 17 g by mouth at bedtime as needed.     Historical Provider, MD  potassium chloride SA (K-DUR,KLOR-CON) 20 MEQ tablet Take 20 mEq by mouth 2 (two)  times daily.    Historical Provider, MD     Family History  Problem Relation Age of Onset  . Heart disease Mother   . Heart disease Father   . Stomach cancer Brother   . Heart disease Sister   . Heart disease Brother   . Hypertension Sister     Social History   Social History  . Marital status: Married    Spouse name: N/A  . Number of children: 2  . Years of education: N/A   Occupational History  . retired Retired   Social History Main Topics  . Smoking status: Never Smoker  . Smokeless tobacco: Never Used  . Alcohol use No  . Drug use: No  . Sexual activity: Not Currently    Birth control/ protection: None   Other Topics Concern  . Not on file   Social History Narrative  . No narrative on file    Review of Systems: A 12 point ROS discussed  Review of Systems  Constitutional: Negative.  HENT: Negative.   Respiratory: Negative.   Cardiovascular: Negative.   Gastrointestinal: Negative.   Genitourinary: Negative.   Musculoskeletal: Negative.   Skin: Negative.   Neurological: Negative.   Hematological: Negative.   Psychiatric/Behavioral: Negative.     Vital Signs: There were no vitals taken for this visit.  Physical Exam  Constitutional: He is oriented to person, place, and time. He appears well-developed.  HENT:  Head: Normocephalic and atraumatic.  Eyes: EOM are normal.  Neck: Normal range of motion.  Cardiovascular: Normal rate, regular rhythm and normal heart sounds.   Pulmonary/Chest: Effort normal and breath sounds normal.  Abdominal: Soft. He exhibits no distension. There is no tenderness.  Neurological: He is alert and oriented to person, place, and time.  Skin: Skin is warm and dry.  Improvement in jaundice  Psychiatric: He has a normal mood and affect. His behavior is normal. Judgment and thought content normal.  Vitals reviewed.  Imaging: Ct Chest Wo Contrast  Result Date: 10/31/2016 CLINICAL DATA:  Cholangiocarcinoma.  Metastatic  disease evaluation. EXAM: CT CHEST WITHOUT CONTRAST TECHNIQUE: Multidetector CT imaging of the chest was performed following the standard protocol without IV contrast. COMPARISON:  Chest CT 09/17/2015, additional priors FINDINGS: Cardiovascular: Unchanged known dilatation of the aortic root, maximal dimension 5.6 cm. Unchanged diffuse disc aneurysmal dilatation of the ascending aorta, maximal dimension 4.6 cm, allowing for differences in caliper placement. Scattered mild atherosclerosis throughout. There are coronary artery calcifications. Mediastinum/Nodes: No mediastinal adenopathy. No bulky hilar adenopathy, decreased sensitivity given lack of IV contrast. There is a 7 mm soft tissue nodule in the subcutaneous tissues superficial to the pectoralis muscle image 28 series 201. Minimal pericardial fluid, small effusion with fluid in the pericardial recesses. The esophagus is decompressed. Heterogeneously enlarged left lobe of the thyroid gland with mild mass effect on the trachea, stable in CT appearance from prior. Lungs/Pleura: Trace right pleural effusion and right lower lobe atelectasis. The 4 mm right paramediastinal nodule image 71 series 205 is unchanged from prior exam and likely pleural thickening. Unchanged 3 mm are left lower lobe pulmonary nodule image 101 series 205. No new pulmonary nodules. Minimal intraluminal debris within the dependent right mainstem bronchus. No focal airspace disease to suggest pneumonia. No pulmonary edema. Upper Abdomen: Biliary drain in place. Small associated pneumobilia. Small hiatal hernia. No adrenal nodule. Atherosclerosis of the upper abdominal aorta. Right renal cyst. Musculoskeletal: Diffuse bony under mineralization. No blastic or destructive lytic lesions. IMPRESSION: 1. Small 7 mm subcutaneous nodule in the anterior chest wall, nonspecific, but new from chest CT January 2017. This may be a subcutaneous lymph node or metastatic deposit, versus incidental soft tissue  nodule. 2. There is otherwise no evidence of metastatic disease in the thorax. 3. Stable aneurysmal dilatation of the aortic root and ascending aorta. 4. Unchanged 3 mm left lower lobe pulmonary nodule dating back to 2013, benign. 5. Stable CT appearance of left thyroid nodule. Electronically Signed   By: Jeb Levering M.D.   On: 10/31/2016 19:41   Ct Abdomen Pelvis W Contrast  Result Date: 10/22/2016 CLINICAL DATA:  83 y/o  M; elevated liver enzymes and jaundice. EXAM: CT ABDOMEN AND PELVIS WITH CONTRAST TECHNIQUE: Multidetector CT imaging of the abdomen and pelvis was performed using the standard protocol following bolus administration of intravenous contrast. CONTRAST:  158m ISOVUE-300 IOPAMIDOL (ISOVUE-300) INJECTION 61% COMPARISON:  08/16/2012 CT of the abdomen and pelvis. FINDINGS: Lower chest: Moderate cardiomegaly. Aortic valvular calcification. No acute abnormality. Hepatobiliary: Moderate intrahepatic biliary ductal dilatation,  mild distention of the gallbladder, a distention of the common bile duct up to 19 mm (series 5, image 69). The common bile duct is abruptly cut off as it enters the head of the pancreas with soft tissue filling the lumen. Two small gallstones within the gallbladder. Pancreas: No main duct dilatation of the pancreas. No inflammatory change. Spleen: Normal in size without focal abnormality. Adrenals/Urinary Tract: Lucencies in the kidneys the largest arising exophytic from the upper pole of right kidney measuring 41 mm, likely representing cysts. Normal adrenal glands. No urinary stone disease or obstructive uropathy. Normal bladder. Stomach/Bowel: Stomach is within normal limits. Appendix appears normal. No evidence of bowel wall thickening, distention, or inflammatory changes. Vascular/Lymphatic: Aortic atherosclerosis. No enlarged abdominal or pelvic lymph nodes. Reproductive: Mild prostate hypertrophy and calcifications. Other: No ascites. Musculoskeletal: No acute osseous  abnormality. Multilevel degenerative changes of the spine with discogenic and prominent lower lumbar facet arthropathy. IMPRESSION: 1. Moderate intrahepatic biliary ductal dilatation, mild distention of the gallbladder, a distention of the common bile duct up to 19 mm. The common bile duct is abruptly cut off as it enters the head of the pancreas with soft tissue filling the lumen. 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 MRI/MRCP is recommended with and without intravenous contrast for further evaluation. 2. Moderate cardiomegaly. 3. Cholelithiasis. 4. Aortic atherosclerosis. 5. Prostate hypertrophy. Electronically Signed   By: Kristine Garbe M.D.   On: 10/22/2016 22:29   Mr 3d Recon At Scanner  Result Date: 10/23/2016 CLINICAL DATA:  83 year old male with history of progressively worsening jaundice. No associated fever, abdominal pain, nausea, emesis, constipation, melena or hematochezia. Biliary tract ductal dilatation noted on recent CT examination. EXAM: MRI ABDOMEN WITHOUT AND WITH CONTRAST (INCLUDING MRCP) TECHNIQUE: Multiplanar multisequence MR imaging of the abdomen was performed both before and after the administration of intravenous contrast. Heavily T2-weighted images of the biliary and pancreatic ducts were obtained, and three-dimensional MRCP images were rendered by post processing. CONTRAST:  16m MULTIHANCE GADOBENATE DIMEGLUMINE 529 MG/ML IV SOLN COMPARISON:  No priors.  CT of the abdomen and pelvis 10/22/2016. FINDINGS: Lower chest: Cardiomegaly. Hepatobiliary: No discrete cystic or solid hepatic lesions. MRCP images demonstrate moderate intra and extrahepatic biliary ductal dilatation which abruptly terminates at or just beyond the junction of the common hepatic and cystic ducts in the proximal common bile duct. The common hepatic duct measures up to 14 mm in diameter. The area of obstruction on MRCP images is poorly demonstrated secondary to  central loss of signal intensity on MRCP images. Unfortunately, this region is also poorly evaluated on postcontrast imaging secondary to patient motion. No discrete mass is identified in this region. Gallbladder is moderately distended. Several tiny filling defects are noted in the gallbladder, compatible with tiny gallstones. Gallbladder wall does not appear thickened and there is no pericholecystic fluid or surrounding inflammatory changes to suggest an acute cholecystitis at this time. Pancreas: No discrete pancreatic mass identified in the pancreatic head to account for the common bile duct obstruction. There are some tiny T1 hypointense, T2 hyperintense, nonenhancing lesions in the distal body and tail of the pancreas, measuring up to 1 cm in diameter, presumably tiny pancreatic pseudocysts. No pancreatic ductal dilatation noted on MRCP images. No peripancreatic inflammatory changes. Spleen:  Unremarkable. Adrenals/Urinary Tract: Well-defined T1 hypointense, T2 hyperintense, nonenhancing lesions are noted in both kidneys, the largest of which is exophytic extending from the upper pole of the right kidney measuring up to 5.4 cm  in diameter. No hydroureteronephrosis in the visualized portions of the abdomen. Bilateral adrenal glands are normal in appearance. Stomach/Bowel: Visualized portions are unremarkable. Vascular/Lymphatic: Aortic atherosclerosis, without definite aneurysm in the abdominal vasculature. No lymphadenopathy noted in the abdomen. Other: No significant volume of ascites noted in the visualized portions of the peritoneal cavity. Musculoskeletal: No aggressive osseous lesions are noted in the visualized portions of the skeleton. IMPRESSION: 1. Abrupt cut off of the common bile duct at or immediately beyond the junction of the common hepatic duct and cystic duct with moderate proximal biliary dilatation. The exact source of obstruction is unclear on today's examination, with differential  considerations including an obstructing ductal stone (which is simply not visualized secondary to technical limitations on today's examination), ductal adenocarcinoma, or obstructing pancreatic head lesion (which is not visualized on today's examination secondary to technical limitations). Further evaluation with ERCP is recommended if clinically appropriate. 2. Cholelithiasis without evidence of acute cholecystitis at this time. Electronically Signed   By: Vinnie Langton M.D.   On: 10/23/2016 14:37   Dg C-arm 1-60 Min-no Report  Result Date: 10/24/2016 Fluoroscopy was utilized by the requesting physician.  No radiographic interpretation.   Mr Abdomen Mrcp Moise Boring Contast  Result Date: 10/23/2016 CLINICAL DATA:  83 year old male with history of progressively worsening jaundice. No associated fever, abdominal pain, nausea, emesis, constipation, melena or hematochezia. Biliary tract ductal dilatation noted on recent CT examination. EXAM: MRI ABDOMEN WITHOUT AND WITH CONTRAST (INCLUDING MRCP) TECHNIQUE: Multiplanar multisequence MR imaging of the abdomen was performed both before and after the administration of intravenous contrast. Heavily T2-weighted images of the biliary and pancreatic ducts were obtained, and three-dimensional MRCP images were rendered by post processing. CONTRAST:  34m MULTIHANCE GADOBENATE DIMEGLUMINE 529 MG/ML IV SOLN COMPARISON:  No priors.  CT of the abdomen and pelvis 10/22/2016. FINDINGS: Lower chest: Cardiomegaly. Hepatobiliary: No discrete cystic or solid hepatic lesions. MRCP images demonstrate moderate intra and extrahepatic biliary ductal dilatation which abruptly terminates at or just beyond the junction of the common hepatic and cystic ducts in the proximal common bile duct. The common hepatic duct measures up to 14 mm in diameter. The area of obstruction on MRCP images is poorly demonstrated secondary to central loss of signal intensity on MRCP images. Unfortunately, this  region is also poorly evaluated on postcontrast imaging secondary to patient motion. No discrete mass is identified in this region. Gallbladder is moderately distended. Several tiny filling defects are noted in the gallbladder, compatible with tiny gallstones. Gallbladder wall does not appear thickened and there is no pericholecystic fluid or surrounding inflammatory changes to suggest an acute cholecystitis at this time. Pancreas: No discrete pancreatic mass identified in the pancreatic head to account for the common bile duct obstruction. There are some tiny T1 hypointense, T2 hyperintense, nonenhancing lesions in the distal body and tail of the pancreas, measuring up to 1 cm in diameter, presumably tiny pancreatic pseudocysts. No pancreatic ductal dilatation noted on MRCP images. No peripancreatic inflammatory changes. Spleen:  Unremarkable. Adrenals/Urinary Tract: Well-defined T1 hypointense, T2 hyperintense, nonenhancing lesions are noted in both kidneys, the largest of which is exophytic extending from the upper pole of the right kidney measuring up to 5.4 cm in diameter. No hydroureteronephrosis in the visualized portions of the abdomen. Bilateral adrenal glands are normal in appearance. Stomach/Bowel: Visualized portions are unremarkable. Vascular/Lymphatic: Aortic atherosclerosis, without definite aneurysm in the abdominal vasculature. No lymphadenopathy noted in the abdomen. Other: No significant volume of ascites noted in the visualized portions  of the peritoneal cavity. Musculoskeletal: No aggressive osseous lesions are noted in the visualized portions of the skeleton. IMPRESSION: 1. Abrupt cut off of the common bile duct at or immediately beyond the junction of the common hepatic duct and cystic duct with moderate proximal biliary dilatation. The exact source of obstruction is unclear on today's examination, with differential considerations including an obstructing ductal stone (which is simply not  visualized secondary to technical limitations on today's examination), ductal adenocarcinoma, or obstructing pancreatic head lesion (which is not visualized on today's examination secondary to technical limitations). Further evaluation with ERCP is recommended if clinically appropriate. 2. Cholelithiasis without evidence of acute cholecystitis at this time. Electronically Signed   By: Trudie Reed M.D.   On: 10/23/2016 14:37   Ir Int [place] Standing Biliary Drain With Cholangiogram  Result Date: 10/25/2016 INDICATION: Obstructive jaundice, distal common bile duct obstruction concerning for malignant obstruction. Failed ERCP. EXAM: IR INT-EXT BILIARY DRAIN W/ CHOLANGIOGRAM MEDICATIONS: Unasyn 3 g; The antibiotic was administered within an appropriate time frame prior to the initiation of the procedure. ANESTHESIA/SEDATION: Moderate (conscious) sedation was employed during this procedure. A total of Versed 1.0 mg and Fentanyl 37.5 mcg was administered intravenously. Moderate Sedation Time: 20 minutes. The patient's level of consciousness and vital signs were monitored continuously by radiology nursing throughout the procedure under my direct supervision. FLUOROSCOPY TIME:  Fluoroscopy Time: 2 minutes 30 seconds (11 mGy). COMPLICATIONS: None immediate. PROCEDURE: Informed written consent was obtained from the patient after a thorough discussion of the procedural risks, benefits and alternatives. All questions were addressed. Maximal Sterile Barrier Technique was utilized including caps, mask, sterile gowns, sterile gloves, sterile drape, hand hygiene and skin antiseptic. A timeout was performed prior to the initiation of the procedure. Previous imaging reviewed. Preliminary ultrasound performed. Dilated peripheral right hepatic ducts demonstrated. Overlying skin marked. Under sterile conditions and local anesthesia, ultrasound percutaneous needle access performed of a peripheral right hepatic duct. There was return of  bile. Contrast injection performed for initial cholangiogram. Diffuse intrahepatic biliary dilatation evident. 018 guidewire advanced centrally followed by the Accustick dilator set. Contrast injection demonstrates diffuse biliary dilatation with a severe stricture/ obstruction of the mid CBD. Below the stricture, the bile duct is nondilated and drains into the duodenum. Catheter and guidewire access were manipulated through the common bile duct severe stricture. Catheter was advanced into the duodenum. Contrast injection confirms position in the duodenum. Amplatz guidewire exchange performed. Ten [place] internal external biliary drain and successfully advanced over the guidewire. Retention loop formed in the duodenum. Contrast injection confirms adequate drainage of the biliary tree. Filling defects within the biliary tree at the conclusion the procedure compatible with blood related to the intervention but no active bleeding appreciated. Catheter secured with a Prolene suture and connected to external gravity drainage bag. Sterile dressing applied. Patient tolerated the procedure well. No immediate complication. IMPRESSION: Successful ultrasound and fluoroscopic 10 French right internal external biliary drain to relieve the biliary obstruction secondary to the mid CBD severe stricture / obstruction. Again, this is concerning for a malignant process. Electronically Signed   By: Judie Petit.  Shick M.D.   On: 10/25/2016 11:24   Ir Exchange Biliary Drain  Result Date: 10/28/2016 INDICATION: Common bile duct obstruction, obstructive jaundice EXAM: IR EXCHANGE BILARY DRAIN; IR ENDOLUMINAL BIOPSY OF BILIARY TREE MEDICATIONS: Patient is already receiving antibiotics as an inpatient. ANESTHESIA/SEDATION: Moderate (conscious) sedation was employed during this procedure. A total of Versed 2.0 mg and Fentanyl 100 mcg was administered intravenously. Moderate Sedation  Time: 12 minutes. The patient's level of consciousness and vital  signs were monitored continuously by radiology nursing throughout the procedure under my direct supervision. FLUOROSCOPY TIME:  Fluoroscopy Time: 4 minutes 0 seconds (23 mGy). COMPLICATIONS: None immediate. PROCEDURE: Informed written consent was obtained from the patient after a thorough discussion of the procedural risks, benefits and alternatives. All questions were addressed. Maximal Sterile Barrier Technique was utilized including caps, mask, sterile gowns, sterile gloves, sterile drape, hand hygiene and skin antiseptic. A timeout was performed prior to the initiation of the procedure. Under sterile conditions and local anesthesia, the existing right internal external 10 [place] biliary drain was removed over an Amplatz guidewire. 8 French sheath inserted into the common bile duct across the CBD obstruction. Endoluminal brush biopsy performed of the CBD obstruction 3 times. Samples placed in saline. Over the guidewire, a 10 [place] internal external biliary drain was replaced. Retention loop formed in the duodenum. Contrast injection confirms position. There is adequate drainage of the biliary tree. CBD obstruction persist. IMPRESSION: Successful endoluminal brush biopsy of the common bile duct obstruction. Replacement of the 10 [place] internal external right biliary drain catheter. Electronically Signed   By: Jerilynn Mages.  Shick M.D.   On: 10/28/2016 14:51   Ir Endoluminal Bx Of Biliary Tree  Result Date: 10/28/2016 INDICATION: Common bile duct obstruction, obstructive jaundice EXAM: IR EXCHANGE BILARY DRAIN; IR ENDOLUMINAL BIOPSY OF BILIARY TREE MEDICATIONS: Patient is already receiving antibiotics as an inpatient. ANESTHESIA/SEDATION: Moderate (conscious) sedation was employed during this procedure. A total of Versed 2.0 mg and Fentanyl 100 mcg was administered intravenously. Moderate Sedation Time: 12 minutes. The patient's level of consciousness and vital signs were monitored continuously by radiology nursing  throughout the procedure under my direct supervision. FLUOROSCOPY TIME:  Fluoroscopy Time: 4 minutes 0 seconds (23 mGy). COMPLICATIONS: None immediate. PROCEDURE: Informed written consent was obtained from the patient after a thorough discussion of the procedural risks, benefits and alternatives. All questions were addressed. Maximal Sterile Barrier Technique was utilized including caps, mask, sterile gowns, sterile gloves, sterile drape, hand hygiene and skin antiseptic. A timeout was performed prior to the initiation of the procedure. Under sterile conditions and local anesthesia, the existing right internal external 10 [place] biliary drain was removed over an Amplatz guidewire. 8 French sheath inserted into the common bile duct across the CBD obstruction. Endoluminal brush biopsy performed of the CBD obstruction 3 times. Samples placed in saline. Over the guidewire, a 10 [place] internal external biliary drain was replaced. Retention loop formed in the duodenum. Contrast injection confirms position. There is adequate drainage of the biliary tree. CBD obstruction persist. IMPRESSION: Successful endoluminal brush biopsy of the common bile duct obstruction. Replacement of the 10 [place] internal external right biliary drain catheter. Electronically Signed   By: Jerilynn Mages.  Shick M.D.   On: 10/28/2016 14:51    Labs:  CBC:  Recent Labs  10/25/16 0520 10/27/16 0831 10/30/16 1144 11/10/16 1207  WBC 7.1 7.8 7.2 7.1  HGB 10.1* 10.8* 11.2* 12.6*  HCT 30.3* 33.2* 33.3* 37.6*  PLT 63* 106* 135* 167    COAGS:  Recent Labs  10/23/16 0607 10/24/16 0731 10/25/16 0520 10/26/16 0703  INR 4.62* 1.25 1.01 0.96    BMP:  Recent Labs  10/27/16 0831 10/29/16 0514 10/30/16 0451 10/31/16 0524 11/10/16 1207  NA 141 139 139 138 137  K 3.7 3.5 4.0 3.8 4.5  CL 108 108 110 108  --   CO2 '26 24 24 22 '$ 20*  GLUCOSE 128*  103* 104* 107* 106  BUN 24* 27* 29* 30* 48.8*  CALCIUM 8.5* 8.4* 8.4* 8.6* 9.1  CREATININE  1.12 1.11 1.16 1.17 1.6*  GFRNONAA 59* 60* 57* 56*  --   GFRAA >60 >60 >60 >60  --     LIVER FUNCTION TESTS:  Recent Labs  10/29/16 0514 10/30/16 0451 10/31/16 0524 11/10/16 1207  BILITOT 12.0* 11.1* 10.2* 8.05*  AST 56* 56* 64* 87*  ALT 71* 69* 76* 122*  ALKPHOS 207* 212* 205* 282*  PROT 5.4* 5.4* 5.3* 6.7  ALBUMIN 2.1* 2.2* 2.2* 3.1*    TUMOR MARKERS:  Recent Labs  10/23/16 1535 10/30/16 1504  CEA  --  3.8  CA199 1,581*  --     Assessment:  Cholangiocarcinoma with obstructive jaundice  Biliary drain working well. Bilirubin down to 8.  Patient seen and CT scan reviewed by Dr. Pascal Lux.   Bag exchanged today.  Patient has appointment with Dr. Barry Dienes tomorrow.   Surgical intervention versus attempt at internalization of biliary stent.  Return at the end of March to plan internalization if patient does not have surgical intervention.  Electronically Signed: Murrell Redden  PA-C 11/11/2016, 11:06 AM   Please refer to Dr. Pascal Lux attestation of this note for management and plan.

## 2016-11-12 DIAGNOSIS — C221 Intrahepatic bile duct carcinoma: Secondary | ICD-10-CM | POA: Diagnosis not present

## 2016-11-13 ENCOUNTER — Ambulatory Visit: Payer: PPO | Admitting: Family Medicine

## 2016-11-14 ENCOUNTER — Encounter: Payer: Self-pay | Admitting: Adult Health

## 2016-11-14 ENCOUNTER — Non-Acute Institutional Stay (SKILLED_NURSING_FACILITY): Payer: PPO | Admitting: Adult Health

## 2016-11-14 DIAGNOSIS — R17 Unspecified jaundice: Secondary | ICD-10-CM

## 2016-11-14 DIAGNOSIS — C221 Intrahepatic bile duct carcinoma: Secondary | ICD-10-CM

## 2016-11-14 DIAGNOSIS — I5022 Chronic systolic (congestive) heart failure: Secondary | ICD-10-CM

## 2016-11-14 NOTE — Progress Notes (Signed)
Location:   [place] Room Number: [place] of Service:  SNF (31)    CODE STATUS: DNR  Allergies  Allergen Reactions  . Codeine Other (See Comments)    nervousness  . Simvastatin Other (See Comments)    Muscle pain/hurting  . Vicodin [Hydrocodone-Acetaminophen] Other (See Comments)    nervousness    Chief Complaint  Patient presents with  . Discharge Note    Discharge    HPI:  He is being discharged to home with home health for pt/ot/rn. He will not need dme. He will need biliary drain supplies. He will need his prescriptions to be written and will need to follow up with his medical provider.    Past Medical History:  Diagnosis Date  . A-fib [facility]) May 2013   s/p TEE/cardioversion June 2013  . Anxiety   . Aortic root enlargement (HCC)    at least 5cm by CT  . Arthritis   . Atrial thrombus   . Bradycardia   . Bronchitis   . Cancer ([place]) 2016   lymphoma  . Chronic anticoagulation    on coumadin - checked in [place]  . Congestive heart failure (CHF) ([place]) 2013  . Diverticulosis   . Dysrhythmia    AFib  . GERD (gastroesophageal reflux disease)   . Hemorrhoids   . HTN (hypertension)   . Pneumonia May 2013  . Pneumonia 2013  . 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 Thrombocytopenia ([place])     Past Surgical History:  Procedure Laterality Date  . CARDIOVERSION  12/30/2011   Procedure: CARDIOVERSION;  Surgeon: Lelon Perla, MD;  Location: [facility] ENDOSCOPY;  Service: Cardiovascular;  Laterality: N/A;  . CARDIOVERSION  02/16/2012   Procedure: CARDIOVERSION;  Surgeon: Lelon Perla, MD;  Location: [facility] ENDOSCOPY;  Service: Cardiovascular;  Laterality: N/A;  . CATARACT EXTRACTION W/PHACO Left 07/28/2016   Procedure: CATARACT EXTRACTION PHACO AND INTRAOCULAR LENS PLACEMENT LEFT EYE:  CDE: 13.00;  Surgeon: Tonny Branch,  MD;  Location: [facility] ORS;  Service: Ophthalmology;  Laterality: Left;  . ENDOSCOPIC RETROGRADE CHOLANGIOPANCREATOGRAPHY (ERCP) WITH PROPOFOL N/A 10/24/2016   Procedure: ENDOSCOPIC RETROGRADE CHOLANGIOPANCREATOGRAPHY (ERCP) WITH PROPOFOL;  Surgeon: Doran Stabler, MD;  Location: [facility] ENDOSCOPY;  Service: Endoscopy;  Laterality: N/A;  . ESOPHAGOGASTRODUODENOSCOPY  12/30/2011   Procedure: ESOPHAGOGASTRODUODENOSCOPY (EGD);  Surgeon: Lelon Perla, MD;  Location: [facility] ENDOSCOPY;  Service: Cardiovascular;  Laterality: N/A;  . ESOPHAGOGASTRODUODENOSCOPY  12/30/2011   Procedure: ESOPHAGOGASTRODUODENOSCOPY (EGD);  Surgeon: Lafayette Dragon, MD;  Location: [facility] ENDOSCOPY;  Service: Endoscopy;  Laterality: N/A;  . IR GENERIC HISTORICAL  10/25/2016   IR INT EXT BILIARY DRAIN WITH CHOLANGIOGRAM 10/25/2016 Greggory Keen, MD [facility]-INTERV RAD  . IR GENERIC HISTORICAL  10/28/2016   IR EXCHANGE BILIARY DRAIN 10/28/2016 Greggory Keen, MD [facility]INTER[facility]  . IR GENERIC HISTORICAL  10/28/2016   IR ENDOLUMINAL BX OF BILIARY TREE 10/28/2016 Greggory Keen, MD [facility]INTER[facility]  . KNEE ARTHROSCOPY  rt knee  . PROSTATE SURGERY    . TEE WITHOUT CARDIOVERSION  12/30/2011   Procedure: TRANSESOPHAGEAL ECHOCARDIOGRAM (TEE);  Surgeon: Lelon Perla, MD;  Location: [facility] ENDOSCOPY;  Service: Cardiovascular;  Laterality: N/A;  . TEE WITHOUT CARDIOVERSION  02/16/2012   Procedure: TRANSESOPHAGEAL ECHOCARDIOGRAM (TEE);  Surgeon: Lelon Perla, MD;  Location: [place];  Service: Cardiovascular;  Laterality: N/A;  Social History   Social History  . Marital status: Married    Spouse name: N/A  . Number of children: 2  . Years of education: N/A   Occupational History  . retired Retired   Social History Main Topics  . Smoking status: Never Smoker  . Smokeless tobacco: Never Used  . Alcohol use No  . Drug use: No  . Sexual activity: Not Currently    Birth control/ protection: None   Other Topics Concern  . Not on file   Social  History Narrative  . No narrative on file   Family History  Problem Relation Age of Onset  . Heart disease Mother   . Heart disease Father   . Stomach cancer Brother   . Heart disease Sister   . Heart disease Brother   . Hypertension Sister     VITAL SIGNS BP 138/77   Pulse 95   Temp 97.7 F (36.5 C)   Resp 18   Ht 5\' 10"  (1.778 m)   Wt 185 lb (83.9 kg)   SpO2 98%   BMI 26.54 kg/m   Patient's Medications  New Prescriptions   No medications on file  Previous Medications   ALBUTEROL (PROVENTIL HFA;VENTOLIN HFA) 108 (90 BASE) MCG/ACT INHALER    Inhale 2 puffs into the lungs every 6 (six) hours as needed for wheezing or shortness of breath.   CLORAZEPATE (TRANXENE) 3.75 MG TABLET    Give .05 tablet by mouth daily as needed for anxiety   FLUTICASONE (FLONASE) 50 MCG/ACT NASAL SPRAY    Place 1 spray into both nostrils daily as needed for allergies or rhinitis.   FOLIC ACID (FOLVITE) 626 MCG TABLET    Take 800 mcg by mouth daily.   FUROSEMIDE (LASIX) 40 MG TABLET    TAKE ONE TABLET BY MOUTH ONCE DAILY   GUAIFENESIN (MUCINEX) 600 MG 12 HR TABLET    Take 600 mg by mouth 2 (two) times daily as needed.   NITROGLYCERIN (NITROSTAT) 0.4 MG SL TABLET    Place 1 tablet (0.4 mg total) under the tongue every 5 (five) minutes x 3 doses as needed for chest pain.   OMEPRAZOLE (PRILOSEC) 20 MG CAPSULE    Take 20 mg by mouth daily before breakfast.    POLYETHYLENE GLYCOL (MIRALAX / GLYCOLAX) PACKET    Take 17 g by mouth at bedtime as needed.    POTASSIUM CHLORIDE SA (K-DUR,KLOR-CON) 20 MEQ TABLET    Take 20 mEq by mouth 2 (two) times daily.  Modified Medications   No medications on file  Discontinued Medications   CLORAZEPATE (TRANXENE) 3.75 MG TABLET    Take 0.5-1 tablets (1.875-3.75 mg total) by mouth daily as needed for anxiety.   EMOLLIENT (EUCERIN) LOTION    Apply 3-5 mLs topically daily.    LISINOPRIL (PRINIVIL,ZESTRIL) 5 MG TABLET    Take 5 mg by mouth daily.     SIGNIFICANT  DIAGNOSTIC EXAMS      Review of Systems  Constitutional: Negative for malaise/fatigue.  Respiratory: Negative for cough and shortness of breath.   Cardiovascular: Negative for chest pain, palpitations and leg swelling.  Gastrointestinal: Negative for abdominal pain, constipation and heartburn.  Musculoskeletal: Negative for back pain, joint pain and myalgias.  Skin: Negative.   Neurological: Negative for dizziness.  Psychiatric/Behavioral: The patient is not nervous/anxious.      Physical Exam  Constitutional: He is oriented to person, place, and time.  Frail appearing in NAD, lying in bed  Neck:  Neck supple. Carotid bruit is not present. No thyromegaly present.  Cardiovascular: Normal rate and intact distal pulses.  An irregular rhythm present. Exam reveals gallop.  Murmur (1/6 SEM) heard. no distal LE swelling. No calf TTP  Pulmonary/Chest: Effort normal. He has no rales. He exhibits no tenderness.  Abdominal: Soft. Bowel sounds are normal. He exhibits no distension There is no hepatomegaly. There is tenderness (biliary drain insertion site but no redness or d/c). There is no rebound and no guarding.  Biliary external drain DTG with no blood/pus noted in drainage  Lymphadenopathy:    He has no cervical adenopathy.  Neurological: He is alert and oriented to person, place, and time. He has normal reflexes.  Skin: Skin is warm and dry. No rash noted.  (+) jaundice  Psychiatric: He has a normal mood and affect. His behavior is normal. Judgment and thought content normal.      ASSESSMENT/ PLAN:  Patient is being discharged with the following home health services:  Pt/ot.rn: to evaluate and treat as indicated for gait balance strength adl training   Patient is being discharged with the following durable medical equipment:  None required has a walker  He will need biliary drain supplies.   Patient has been advised to f/u with their PCP in 1-2 weeks to bring them up to date on  their rehab stay.  Social services at facility was responsible for arranging this appointment.  Pt was provided with a 30 day supply of prescriptions for medications and refills must be obtained from their PCP.  For controlled substances, a more limited supply may be provided adequate until PCP appointment only. # 15 trazodone 3.75 mg tabs    Time spent with patient  45  minutes >50% time spent counseling; reviewing medical record; tests; labs; and developing future plan of care   Ok Edwards NP [facility]  Contact [phone] Monday through Friday 8am- 5pm  After hours call [phone]

## 2016-11-17 ENCOUNTER — Encounter: Payer: Self-pay | Admitting: Family Medicine

## 2016-11-17 ENCOUNTER — Telehealth: Payer: Self-pay | Admitting: Internal Medicine

## 2016-11-17 ENCOUNTER — Other Ambulatory Visit (HOSPITAL_COMMUNITY): Payer: Self-pay | Admitting: General Surgery

## 2016-11-17 ENCOUNTER — Other Ambulatory Visit: Payer: Self-pay | Admitting: General Surgery

## 2016-11-17 DIAGNOSIS — K831 Obstruction of bile duct: Secondary | ICD-10-CM

## 2016-11-17 NOTE — Telephone Encounter (Signed)
Lisinopril will not affect HR. No longer on beta blocker due to marked bradycardia.   Would leave on current regimen for now as outlined by house staff.  Burtis Junes, RN, [place] [address] [place], [place]  [phone]

## 2016-11-17 NOTE — Telephone Encounter (Signed)
Massie is calling to see if you can review his father med list that he received when he was discharged today from [facility]. Please call

## 2016-11-17 NOTE — Telephone Encounter (Signed)
I spoke with pt who gave me permission to talk with his son Sharvil.  Pt reports he has been at [facility] and has not taken lisinopril in a month. He is concerned this may be affecting his heart rate.  I explained to him that lisinopril should not affect heart rate. I spoke with pt's son who reports he is calling to make sure nothing was removed from pt's med list that could affect heart rate.  He reports pt's heart rate is in the 80's.  I reviewed last office visit note and discharge summary and told him no medicine changes should affect heart rate.  Son reports coumadin was stopped during hospitalization and pt is currently not taking due to upcoming biliary stent placement.  Pt is seeing primary care physician on Friday and I told pt and son to keep this appt.

## 2016-11-20 DIAGNOSIS — I4891 Unspecified atrial fibrillation: Secondary | ICD-10-CM | POA: Diagnosis not present

## 2016-11-20 DIAGNOSIS — I5022 Chronic systolic (congestive) heart failure: Secondary | ICD-10-CM | POA: Diagnosis not present

## 2016-11-20 DIAGNOSIS — I77810 Thoracic aortic ectasia: Secondary | ICD-10-CM | POA: Diagnosis not present

## 2016-11-20 DIAGNOSIS — Z4801 Encounter for change or removal of surgical wound dressing: Secondary | ICD-10-CM | POA: Diagnosis not present

## 2016-11-20 DIAGNOSIS — I110 Hypertensive heart disease with heart failure: Secondary | ICD-10-CM | POA: Diagnosis not present

## 2016-11-20 DIAGNOSIS — K831 Obstruction of bile duct: Secondary | ICD-10-CM | POA: Diagnosis not present

## 2016-11-20 DIAGNOSIS — C221 Intrahepatic bile duct carcinoma: Secondary | ICD-10-CM | POA: Diagnosis not present

## 2016-11-20 DIAGNOSIS — Q443 Congenital stenosis and stricture of bile ducts: Secondary | ICD-10-CM | POA: Diagnosis not present

## 2016-11-20 DIAGNOSIS — F419 Anxiety disorder, unspecified: Secondary | ICD-10-CM | POA: Diagnosis not present

## 2016-11-20 DIAGNOSIS — M1990 Unspecified osteoarthritis, unspecified site: Secondary | ICD-10-CM | POA: Diagnosis not present

## 2016-11-21 ENCOUNTER — Telehealth: Payer: Self-pay

## 2016-11-21 ENCOUNTER — Encounter: Payer: Self-pay | Admitting: Family Medicine

## 2016-11-21 ENCOUNTER — Other Ambulatory Visit (HOSPITAL_COMMUNITY): Payer: Self-pay | Admitting: Interventional Radiology

## 2016-11-21 ENCOUNTER — Ambulatory Visit (HOSPITAL_COMMUNITY)
Admission: RE | Admit: 2016-11-21 | Discharge: 2016-11-21 | Disposition: A | Discharge status: Home / Self Care | Payer: PPO | Source: Ambulatory Visit | Attending: Interventional Radiology | Admitting: Interventional Radiology

## 2016-11-21 ENCOUNTER — Ambulatory Visit (INDEPENDENT_AMBULATORY_CARE_PROVIDER_SITE_OTHER): Payer: PPO | Admitting: Family Medicine

## 2016-11-21 ENCOUNTER — Other Ambulatory Visit: Payer: Self-pay

## 2016-11-21 VITALS — BP 107/57 | HR 92 | Temp 97.2°F | Ht 70.0 in | Wt 184.4 lb

## 2016-11-21 DIAGNOSIS — C221 Intrahepatic bile duct carcinoma: Secondary | ICD-10-CM | POA: Diagnosis not present

## 2016-11-21 DIAGNOSIS — Y828 Other medical devices associated with adverse incidents: Secondary | ICD-10-CM | POA: Diagnosis not present

## 2016-11-21 DIAGNOSIS — I5022 Chronic systolic (congestive) heart failure: Secondary | ICD-10-CM | POA: Diagnosis not present

## 2016-11-21 DIAGNOSIS — T85590A Other mechanical complication of bile duct prosthesis, initial encounter: Secondary | ICD-10-CM | POA: Diagnosis not present

## 2016-11-21 DIAGNOSIS — R630 Anorexia: Secondary | ICD-10-CM

## 2016-11-21 DIAGNOSIS — T85510A Breakdown (mechanical) of bile duct prosthesis, initial encounter: Secondary | ICD-10-CM | POA: Insufficient documentation

## 2016-11-21 DIAGNOSIS — I480 Paroxysmal atrial fibrillation: Secondary | ICD-10-CM

## 2016-11-21 DIAGNOSIS — K831 Obstruction of bile duct: Secondary | ICD-10-CM

## 2016-11-21 HISTORY — PX: IR GENERIC HISTORICAL: IMG1180011

## 2016-11-21 MED ORDER — IOPAMIDOL (ISOVUE-300) INJECTION 61%
INTRAVENOUS | Status: AC
  Administered 2016-11-21: 5 mL via INTRAVENOUS
  Filled 2016-11-21: qty 50

## 2016-11-21 MED ORDER — LIDOCAINE HCL 1 % IJ SOLN
INTRAMUSCULAR | Status: AC
  Filled 2016-11-21: qty 20

## 2016-11-21 MED ORDER — IOPAMIDOL (ISOVUE-300) INJECTION 61%
50.0000 mL | Freq: Once | INTRAVENOUS | Status: AC | PRN
  Administered 2016-11-21: 5 mL via INTRAVENOUS

## 2016-11-21 MED ORDER — LIDOCAINE HCL 1 % IJ SOLN
INTRAMUSCULAR | Status: DC | PRN
  Administered 2016-11-21: 10 mL

## 2016-11-21 MED ORDER — ENSURE COMPLETE PO LIQD: 237.0000 mL | Freq: Two times a day (BID) | ORAL | 1 refills | Status: DC

## 2016-11-21 MED FILL — CEPHALEXIN 500 MG CAPSULE: 500 | 7 days supply | Qty: 14 | Fill #0

## 2016-11-21 MED FILL — NORMAL SALINE FLUSH SYRINGE: 0.9 | 30 days supply | Qty: 300 | Fill #0

## 2016-11-21 NOTE — Telephone Encounter (Signed)
FYI- Patient has appointment today. Benjamine Mola called, who is his home health nurse and states that she came to see him yesterday and his bile duct drain had thick drainage around it. States that she could not see the insertion site and there was swelling around the top of the insertion site.

## 2016-11-21 NOTE — Progress Notes (Signed)
   HPI  Patient presents today here for follow-up after leaving the nursing home.  Patient was admitted to the hospital with severe jaundice and found to have cholangiocarcinoma on February 21, proximal and one month ago.   We received nursing call today from home health nurse that stated there was copious discharge around the biliary drain. The patient and his family are present in very concerned about the drainage around the drain tube. They do not think they will pursue surgical intervention.   He feels well overall, however he has decreased appetite.  Patient complains of ear pain intermittently for the last week or so. He has been released from the nursing home for the last 5 days. They deny need any prescription needs at this time.  PMH: Smoking status noted ROS: Per HPI  Objective: BP (!) 107/57   Pulse 92   Temp 97.2 F (36.2 C) (Oral)   Ht 5\' 10"  (1.778 m)   Wt 184 lb 6.4 oz (83.6 kg)   BMI 26.46 kg/m  Gen: NAD, alert, cooperative with exam HEENT: NCAT, oropharynx moist and clear, TMs normal bilaterally with minimal cerumen burden CV: Regular rhythm today, + gallup, no murmur appreciated Resp: CTABL, no wheezes, non-labored Abd: Soft, positive bowel sounds, some tenderness in the right upper quadrant, biliary drain site examined with moderate surrounding yellow/purulent fluid, approx 50 mL of dark clear fluid in drain bag.  Ext: No edema, warm Neuro: Alert and oriented, No gross deficits Skin: Jaundice  Assessment and plan:  # Cholangiocarcinoma Patient with external biliary drain in place, still with jaundice There is some concern for either infection or malfunction of the biliary drain with purulent drainage around the drain today. Original radiology has been very helpful to see him just a few hours to examine the drain.  # Chronic systolic heart failure Appears euvolemic, denies any dyspnea today. Continue Lasix  # Decreased appetite Patient was  understandable decreased appetite, he would like to start using ensure boost. Rx written, however I did discuss that this would possibly not be covered by insurance  # Atrial fibrillation Rhythm regular on exam today, slightly elevated rate at 92 Holding anticoagulation until he can follow-up with cardiology   Pt has follow up scheduled with Oncology, Will need to schedule Cardiology follow up.   Appreciate Interventional radiology's eval of drain site today.   Laroy Apple, MD [facility] 11/21/2016, 12:06 PM

## 2016-11-21 NOTE — Procedures (Signed)
Interventional Radiology Procedure Note  83 yo male with malfunction of int/ext biliary drain.  Drainage at the skin site. He denies any fevers/rigors/chills.   Procedure:  Injection and replacement of int/ext biliary tube.  New 42F drain into the duodenum.   Findings:  Partially obstructed tube.  New tube draining easily into the duodenum.    The skin site appears to have cellulitis.    Complications: None Recommendations:  -Keflex 500mg  PO BID x 7 days for skin site - Drain capped for trial.  Will have a spare gravity bag at home. - Follow up appointment next week at [facility] to check the capping trial and discuss internalization.  - Flush drain 2-3 times per week.  - Routine drain care   Signed,  Dulcy Fanny. Earleen Newport, DO  '

## 2016-11-21 NOTE — Telephone Encounter (Signed)
Will ask nursing to see if we can get him an appointment with interventional radiology to check and see if drain is functioning properly.   Laroy Apple, MD [facility] 11/21/2016, 9:10 AM

## 2016-11-24 ENCOUNTER — Other Ambulatory Visit (HOSPITAL_BASED_OUTPATIENT_CLINIC_OR_DEPARTMENT_OTHER): Payer: PPO

## 2016-11-24 ENCOUNTER — Telehealth: Payer: Self-pay | Admitting: Hematology

## 2016-11-24 ENCOUNTER — Ambulatory Visit (HOSPITAL_BASED_OUTPATIENT_CLINIC_OR_DEPARTMENT_OTHER): Payer: PPO | Admitting: Hematology

## 2016-11-24 ENCOUNTER — Institutional Professional Consult (permissible substitution): Payer: PPO | Admitting: Radiation Oncology

## 2016-11-24 VITALS — BP 118/82 | HR 99 | Temp 97.9°F | Resp 18 | Ht 70.0 in | Wt 187.5 lb

## 2016-11-24 DIAGNOSIS — I5022 Chronic systolic (congestive) heart failure: Secondary | ICD-10-CM | POA: Diagnosis not present

## 2016-11-24 DIAGNOSIS — I110 Hypertensive heart disease with heart failure: Secondary | ICD-10-CM | POA: Diagnosis not present

## 2016-11-24 DIAGNOSIS — Z4801 Encounter for change or removal of surgical wound dressing: Secondary | ICD-10-CM | POA: Diagnosis not present

## 2016-11-24 DIAGNOSIS — Q443 Congenital stenosis and stricture of bile ducts: Secondary | ICD-10-CM | POA: Diagnosis not present

## 2016-11-24 DIAGNOSIS — I4891 Unspecified atrial fibrillation: Secondary | ICD-10-CM | POA: Diagnosis not present

## 2016-11-24 DIAGNOSIS — I77810 Thoracic aortic ectasia: Secondary | ICD-10-CM | POA: Diagnosis not present

## 2016-11-24 DIAGNOSIS — M1990 Unspecified osteoarthritis, unspecified site: Secondary | ICD-10-CM | POA: Diagnosis not present

## 2016-11-24 DIAGNOSIS — C221 Intrahepatic bile duct carcinoma: Secondary | ICD-10-CM

## 2016-11-24 DIAGNOSIS — K831 Obstruction of bile duct: Secondary | ICD-10-CM | POA: Diagnosis not present

## 2016-11-24 DIAGNOSIS — F419 Anxiety disorder, unspecified: Secondary | ICD-10-CM | POA: Diagnosis not present

## 2016-11-24 LAB — COMPREHENSIVE METABOLIC PANEL
ALBUMIN: 2.8 g/dL — AB (ref 3.5–5.0)
ALK PHOS: 288 U/L — AB (ref 40–150)
ALT: 92 U/L — AB (ref 0–55)
AST: 58 U/L — AB (ref 5–34)
Anion Gap: 9 mEq/L (ref 3–11)
BUN: 24.9 mg/dL (ref 7.0–26.0)
CALCIUM: 9 mg/dL (ref 8.4–10.4)
CO2: 25 mEq/L (ref 22–29)
CREATININE: 1.4 mg/dL — AB (ref 0.7–1.3)
Chloride: 104 mEq/L (ref 98–109)
EGFR: 46 mL/min/{1.73_m2} — ABNORMAL LOW (ref >90)
Glucose: 136 mg/dl (ref 70–140)
Potassium: 5.3 mEq/L — ABNORMAL HIGH (ref 3.5–5.1)
Sodium: 137 mEq/L (ref 136–145)
Total Bilirubin: 3.26 mg/dL — ABNORMAL HIGH (ref 0.20–1.20)
Total Protein: 6 g/dL — ABNORMAL LOW (ref 6.4–8.3)

## 2016-11-24 LAB — CBC & DIFF AND RETIC
BASO%: 0.2 % (ref 0.0–2.0)
Basophils Absolute: 0 10*3/uL (ref 0.0–0.1)
EOS ABS: 0.1 10*3/uL (ref 0.0–0.5)
EOS%: 1.4 % (ref 0.0–7.0)
HEMATOCRIT: 36.6 % — AB (ref 38.4–49.9)
HEMOGLOBIN: 11.9 g/dL — AB (ref 13.0–17.1)
Immature Retic Fract: 2 % — ABNORMAL LOW (ref 3.00–10.60)
LYMPH%: 21.7 % (ref 14.0–49.0)
MCH: 32 pg (ref 27.2–33.4)
MCHC: 32.5 g/dL (ref 32.0–36.0)
MCV: 98.4 fL — ABNORMAL HIGH (ref 79.3–98.0)
MONO#: 0.9 10*3/uL (ref 0.1–0.9)
MONO%: 15.3 % — ABNORMAL HIGH (ref 0.0–14.0)
NEUT#: 3.6 10*3/uL (ref 1.5–6.5)
NEUT%: 61.4 % (ref 39.0–75.0)
Platelets: 100 10*3/uL — ABNORMAL LOW (ref 140–400)
RBC: 3.72 10*6/uL — ABNORMAL LOW (ref 4.20–5.82)
RDW: 15.5 % — AB (ref 11.0–14.6)
RETIC %: 1.98 % — AB (ref 0.80–1.80)
RETIC CT ABS: 73.66 10*3/uL (ref 34.80–93.90)
WBC: 5.9 10*3/uL (ref 4.0–10.3)
lymph#: 1.3 10*3/uL (ref 0.9–3.3)

## 2016-11-24 NOTE — Telephone Encounter (Signed)
Gave patient avs report and appointments for April, including appointment with Dr. Lisbeth Renshaw 12/08/16.

## 2016-11-25 ENCOUNTER — Ambulatory Visit (INDEPENDENT_AMBULATORY_CARE_PROVIDER_SITE_OTHER): Payer: PPO | Admitting: Family Medicine

## 2016-11-25 ENCOUNTER — Encounter: Payer: Self-pay | Admitting: Family Medicine

## 2016-11-25 ENCOUNTER — Telehealth: Payer: Self-pay

## 2016-11-25 VITALS — BP 109/61 | HR 94 | Temp 97.0°F | Ht 70.0 in | Wt 191.0 lb

## 2016-11-25 DIAGNOSIS — L231 Allergic contact dermatitis due to adhesives: Secondary | ICD-10-CM

## 2016-11-25 LAB — CANCER ANTIGEN 19-9: CAN 19-9: 295 U/mL — AB (ref 0–35)

## 2016-11-25 NOTE — Progress Notes (Signed)
   HPI  Patient presents today for a wound check.  Patient and his wife are discerned about drainage in care of the cholecystostomy tube in place since Friday. They have a small amount of green yellow drainage and a bandage to show me. They have not had any dressing changes until one day ago, he has developed some irritation around the areas that were taped with one small area of bleeding which has stopped currently.  He feels well, occasionally he has discomfort stretching across his chest when he moves only. This is only positional, not exertional. Patient states that he feels well considering all that is going through, breathing easily, and denies any chest pain currently. He is tolerating food and fluids normally.  He is also tolerating Keflex without a problem  PMH: Smoking status noted ROS: Per HPI  Objective: BP 109/61   Pulse 94   Temp 97 F (36.1 C) (Oral)   Ht 5\' 10"  (1.778 m)   Wt 191 lb (86.6 kg)   BMI 27.41 kg/m  Gen: NAD, alert, cooperative with exam HEENT: NCAT CV: RRR, good S1/S2, no murmur Resp: CTABL, no wheezes, non-labored Ext: No edema, warm Neuro: Alert and oriented, No gross deficits  Wound Right upper quadrant with cholecystostomy tube in place, no active drainage from the tube, this has improved markedly since before the replacement on Friday. Small amount of green-yellow drainage on the bandage which has developed over the last 24 hours. No erythema or tenderness Around the edges of the wound consistent with the areas take tea has erythema and small papules consistent with contact dermatitis  Assessment and plan:  # Adhesive dermatitis His wound appears healthy, I agree with continuing Keflex. Patient has several questions about the drain and whether or not to flush the drain. Interventional radiology has been contacted and has been very helpful to explain that he can flush the drain every other day. My nurse has notified the family of this who  understands it easily after the visit. Change adhesive, I have replaced his bandage with a Telfa pad and paper tape. Patient will try different adhesives at home, he will use paper tape of the adhesive that he's given by home health irritates him like the previous one did Also discussed thin layer of hydrocortisone or Vaseline over the erythematous areas Approximately 3 mm x 2 mm area of bleeding which has completely dried up on my exam, routine wound care discussed  Patient was asked to call his cardiologist for a follow-up appointment. Coumadin has been held since hospitalization.  Laroy Apple, MD [facility] 11/25/2016, 11:43 AM

## 2016-11-25 NOTE — Telephone Encounter (Signed)
Spoke with with Dr. Earleen Newport at interventional radiology about questions patient had about his drain. Per Dr. Earleen Newport drain can be flushed every other day. Un cap, 5-10cc's and re cap. If patient has any further questions about hid drain he can contact their office at [phone].  Spoke with patient and patient is aware.

## 2016-12-01 ENCOUNTER — Encounter: Payer: Self-pay | Admitting: Radiology

## 2016-12-01 DIAGNOSIS — K831 Obstruction of bile duct: Secondary | ICD-10-CM | POA: Diagnosis not present

## 2016-12-01 DIAGNOSIS — M1990 Unspecified osteoarthritis, unspecified site: Secondary | ICD-10-CM | POA: Diagnosis not present

## 2016-12-01 DIAGNOSIS — I5022 Chronic systolic (congestive) heart failure: Secondary | ICD-10-CM | POA: Diagnosis not present

## 2016-12-01 DIAGNOSIS — Q443 Congenital stenosis and stricture of bile ducts: Secondary | ICD-10-CM | POA: Diagnosis not present

## 2016-12-01 DIAGNOSIS — I4891 Unspecified atrial fibrillation: Secondary | ICD-10-CM | POA: Diagnosis not present

## 2016-12-01 DIAGNOSIS — I77810 Thoracic aortic ectasia: Secondary | ICD-10-CM | POA: Diagnosis not present

## 2016-12-01 DIAGNOSIS — Z4801 Encounter for change or removal of surgical wound dressing: Secondary | ICD-10-CM | POA: Diagnosis not present

## 2016-12-01 DIAGNOSIS — I110 Hypertensive heart disease with heart failure: Secondary | ICD-10-CM | POA: Diagnosis not present

## 2016-12-01 DIAGNOSIS — F419 Anxiety disorder, unspecified: Secondary | ICD-10-CM | POA: Diagnosis not present

## 2016-12-01 DIAGNOSIS — C221 Intrahepatic bile duct carcinoma: Secondary | ICD-10-CM | POA: Diagnosis not present

## 2016-12-01 NOTE — Progress Notes (Signed)
GI Location of Tumor / Histology:   Jimmy Myers presented  months ago with symptoms of: Jaundice  At Dr. Josem Kaufmann to Ed  Biopsies of  (if applicable) revealed: Diagnosis 10/28/2016: BILE DUCT BRUSHING (SPECIMEN 1 OF 1 COLLECTED 10/28/2016) MALIGNANT CELLS CONSISTENT WITH ADENOCARCINOMA.   Past/Anticipated interventions by surgeon, if any:  10/24/16     (ERCP)  IR transhepatic biliary drainage catheter  Dr. Wilfrid Lund, MD  Past/Anticipated interventions by medical oncology, if any: Dr. 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 Elyn Aquas at the [facility] in [place]. He was treated with Rituxan and some additional chemotherapy. He notes that he had his last follow-up with his oncologist in December 2017.  Weight changes, if any: 5-10  Lb `weight loss  Bowel/Bladder complaints, if any: regular bowels at present, bladder  Nocturia  q 2 hours,,   Nausea / Vomiting, if any: no  Pain issues, if any:  Biliary tube right side bothers his at times,  12/18/16  Any blood per rectum:   no  SAFETY ISSUES:  Yes, walks with a cane, knes buckle at times has fallen 3x past 2 months  Prior radiation? NO  Pacemaker/ICD? NO Is the patient on methotrexate?NO Current Complaints/Details: Married,  Chronic Systolic  CHF, A-Fib, Anxiety,HTN,pneumonia, Aortic root enlargement, HX Lymphoma 2016 tx at [facility], never tobacco use,no alcohol or drug use,   Brother Colon cancer , Mother,Father,brother,sister heart disease, brother stomach cancer,  BP 129/73 (BP Location: Left Arm, Patient Position: Sitting, Cuff Size: Normal)   Pulse 79   Temp 98.2 F (36.8 C) (Oral)   Resp 20   Ht 5\' 10"  (1.778 m)   Wt 203 lb 12.8 oz (92.4 kg)   SpO2 100%   BMI 29.24 kg/m   Wt Readings from Last 3 Encounters:  12/08/16 203 lb 12.8 oz (92.4 kg)  12/03/16 195 lb (88.5 kg)  11/25/16 191 lb (86.6 kg)

## 2016-12-02 ENCOUNTER — Other Ambulatory Visit: Payer: PPO

## 2016-12-03 ENCOUNTER — Other Ambulatory Visit: Payer: Self-pay | Admitting: Interventional Radiology

## 2016-12-03 ENCOUNTER — Ambulatory Visit
Admission: RE | Admit: 2016-12-03 | Discharge: 2016-12-03 | Disposition: A | Discharge status: Home / Self Care | Payer: PPO | Source: Ambulatory Visit | Attending: General Surgery | Admitting: General Surgery

## 2016-12-03 DIAGNOSIS — K831 Obstruction of bile duct: Secondary | ICD-10-CM

## 2016-12-03 DIAGNOSIS — C221 Intrahepatic bile duct carcinoma: Secondary | ICD-10-CM

## 2016-12-03 DIAGNOSIS — K838 Other specified diseases of biliary tract: Secondary | ICD-10-CM | POA: Diagnosis not present

## 2016-12-03 HISTORY — PX: IR RADIOLOGIST EVAL & MGMT: IMG5224

## 2016-12-03 MED ORDER — IOPAMIDOL (ISOVUE-300) INJECTION 61%
100.0000 mL | Freq: Once | INTRAVENOUS | Status: AC | PRN
  Administered 2016-12-03: 100 mL via INTRAVENOUS

## 2016-12-03 NOTE — Progress Notes (Signed)
Chief Complaint: Cholangiocarcinoma and status post percutaneous biliary drainage with internal/external biliary drainage catheter placement on 10/25/2016.  History of Present Illness: Jimmy Myers is a 83 y.o. male who presented in late February with progressive jaundice. ERCP was unsuccessful for biliary drainage due to high-grade obstruction of the common bile duct/common hepatic duct centrally. He underwent placement of a 10 French percutaneous internal/external biliary drainage catheter by Dr. Annamaria Boots on 10/25/2016 via right lobe ductal access. Endoluminal brush biopsy at the level of common bile duct obstruction was performed on 10/28/2016 revealing adenocarcinoma with diagnosis consistent with cholangiocarcinoma.   The biliary drainage catheter was exchanged on 11/21/2016 by Dr. Earleen Newport due to some drainage around the skin exit site. The drain was capped at that time for a trial of internal drainage. Bilirubin continues to decrease and the patient has tolerated internal capping well without symptoms. Latest bilirubin is 3.26 which is down from a highest level of 12. He is currently flushing the biliary drain with 10 mL of sterile saline every other day. He denies fever or abdominal pain.   Jimmy Myers does have some periodic lower extremity edema without progressive persistent edema. He is eating well and has recently gained some weight. He does have upcoming appointments at the [facility] with both Dr. Irene Limbo and Dr. Lisbeth Renshaw on 4/9 and 4/12 next week. He also has a follow-up appointment with Dr. Barry Dienes. He states that he may not keep this appointment and does not feel that he would like to be considered for surgical resection at this point.  Past Medical History:  Diagnosis Date  . A-fib [facility]) May 2013   s/p TEE/cardioversion June 2013  . Anxiety   . Aortic root enlargement (HCC)    at least 5cm by CT  . Arthritis   . Atrial thrombus   . Bradycardia   . Bronchitis   . Cancer ([place]) 2016   lymphoma  . Chronic anticoagulation    on coumadin - checked in [place]  . Congestive heart failure (CHF) ([place]) 2013  . Diverticulosis   . Dysrhythmia    AFib  . GERD (gastroesophageal reflux disease)   . Hemorrhoids   . HTN (hypertension)   . Pneumonia May 2013  . Pneumonia 2013  . 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 Thrombocytopenia ([place])     Past Surgical History:  Procedure Laterality Date  . CARDIOVERSION  12/30/2011   Procedure: CARDIOVERSION;  Surgeon: Lelon Perla, MD;  Location: [facility] ENDOSCOPY;  Service: Cardiovascular;  Laterality: N/A;  . CARDIOVERSION  02/16/2012   Procedure: CARDIOVERSION;  Surgeon: Lelon Perla, MD;  Location: [facility] ENDOSCOPY;  Service: Cardiovascular;  Laterality: N/A;  . CATARACT EXTRACTION W/PHACO Left 07/28/2016   Procedure: CATARACT EXTRACTION PHACO AND INTRAOCULAR LENS PLACEMENT LEFT EYE:  CDE: 13.00;  Surgeon: Tonny Branch, MD;  Location: [facility] ORS;  Service: Ophthalmology;  Laterality: Left;  . ENDOSCOPIC RETROGRADE CHOLANGIOPANCREATOGRAPHY (ERCP) WITH PROPOFOL N/A 10/24/2016   Procedure: ENDOSCOPIC RETROGRADE CHOLANGIOPANCREATOGRAPHY (ERCP) WITH PROPOFOL;  Surgeon: Doran Stabler, MD;  Location: [facility] ENDOSCOPY;  Service: Endoscopy;  Laterality: N/A;  . ESOPHAGOGASTRODUODENOSCOPY  12/30/2011   Procedure: ESOPHAGOGASTRODUODENOSCOPY (EGD);  Surgeon: Lelon Perla, MD;  Location: [facility] ENDOSCOPY;  Service: Cardiovascular;  Laterality: N/A;  . ESOPHAGOGASTRODUODENOSCOPY  12/30/2011   Procedure: ESOPHAGOGASTRODUODENOSCOPY (EGD);  Surgeon: Lafayette Dragon,  MD;  Location: [place] ENDOSCOPY;  Service: Endoscopy;  Laterality: N/A;  . IR GENERIC HISTORICAL  10/25/2016   IR INT EXT BILIARY DRAIN WITH CHOLANGIOGRAM 10/25/2016 Greggory Keen, MD [facility]-INTER[facility] RAD  . IR GENERIC HISTORICAL  10/28/2016   IR EXCHANGE BILIARY DRAIN 10/28/2016  Greggory Keen, MD [facility]  . IR GENERIC HISTORICAL  10/28/2016   IR ENDOLUMINAL BX OF BILIARY TREE 10/28/2016 Greggory Keen, MD [facility]  . IR GENERIC HISTORICAL  11/21/2016   IR EXCHANGE BILIARY DRAIN 11/21/2016 Corrie Mckusick, DO [facility]-INTERV RAD  . KNEE ARTHROSCOPY  rt knee  . PROSTATE SURGERY    . TEE WITHOUT CARDIOVERSION  12/30/2011   Procedure: TRANSESOPHAGEAL ECHOCARDIOGRAM (TEE);  Surgeon: Lelon Perla, MD;  Location: [facility] ENDOSCOPY;  Service: Cardiovascular;  Laterality: N/A;  . TEE WITHOUT CARDIOVERSION  02/16/2012   Procedure: TRANSESOPHAGEAL ECHOCARDIOGRAM (TEE);  Surgeon: Lelon Perla, MD;  Location: [facility] ENDOSCOPY;  Service: Cardiovascular;  Laterality: N/A;    Allergies: Codeine; Simvastatin; and Vicodin [hydrocodone-acetaminophen]  Medications: Prior to Admission medications   Medication Sig Start Date End Date Taking? Authorizing Provider  albuterol (PROVENTIL HFA;VENTOLIN HFA) 108 (90 BASE) MCG/ACT inhaler Inhale 2 puffs into the lungs every 6 (six) hours as needed for wheezing or shortness of breath.   Yes Historical Provider, MD  clorazepate (TRANXENE) 3.75 MG tablet Give .05 tablet by mouth daily as needed for anxiety   Yes Historical Provider, MD  fluticasone (FLONASE) 50 MCG/ACT nasal spray Place 1 spray into both nostrils daily as needed for allergies or rhinitis.   Yes Historical Provider, MD  folic acid (FOLVITE) 222 MCG tablet Take 800 mcg by mouth daily.   Yes Historical Provider, MD  furosemide (LASIX) 40 MG tablet TAKE ONE TABLET BY MOUTH ONCE DAILY 09/26/16  Yes Burtis Junes, NP  nitroGLYCERIN (NITROSTAT) 0.4 MG SL tablet Place 1 tablet (0.4 mg total) under the tongue every 5 (five) minutes x 3 doses as needed for chest pain. 05/15/14 10/06/17 Yes Burtis Junes, NP  omeprazole (PRILOSEC) 20 MG capsule Take 20 mg by mouth daily before breakfast.    Yes Historical Provider, MD  polyethylene glycol (MIRALAX / GLYCOLAX) packet Take 17 g by mouth at bedtime  as needed.    Yes Historical Provider, MD  potassium chloride SA (K-DUR,KLOR-CON) 20 MEQ tablet Take 20 mEq by mouth 2 (two) times daily.   Yes Historical Provider, MD  cephALEXin (KEFLEX) 500 MG capsule  11/21/16   Historical Provider, MD  feeding supplement, ENSURE COMPLETE, (ENSURE COMPLETE) LIQD Take 237 mLs by mouth 2 (two) times daily between meals. Patient not taking: Reported on 12/03/2016 11/21/16   [name], MD  guaiFENesin (MUCINEX) 600 MG 12 hr tablet Take 600 mg by mouth 2 (two) times daily as needed.    Historical Provider, MD     Family History  Problem Relation Age of Onset  . Heart disease Mother   . Heart disease Father   . Stomach cancer Brother   . Heart disease Sister   . Heart disease Brother   . Hypertension Sister     Social History   Social History  . Marital status: Married    Spouse name: N/A  . Number of children: 2  . Years of education: N/A   Occupational History  . retired Retired   Social History Main Topics  . Smoking status: Never Smoker  . Smokeless tobacco: Never Used  . Alcohol use No  . Drug use: No  .  Sexual activity: Not Currently    Birth control/ protection: None   Other Topics Concern  . Not on file   Social History Narrative  . No narrative on file    ECOG Status: 0 - Asymptomatic  Review of Systems: A 12 point ROS discussed and pertinent positives are indicated in the HPI above.  All other systems are negative.  Review of Systems  Respiratory: Negative.   Cardiovascular: Positive for leg swelling. Negative for chest pain and palpitations.  Gastrointestinal: Negative.   Genitourinary: Negative.   Musculoskeletal: Negative.   Neurological: Negative.     Vital Signs: BP 122/72 (BP Location: Left Arm, Patient Position: Sitting, Cuff Size: Normal)   Pulse 77   Temp 97.7 F (36.5 C) (Oral)   Resp 16   Ht 5\' 10"  (1.778 m)   Wt 195 lb (88.5 kg)   SpO2 100%   BMI 27.98 kg/m   Physical Exam  Constitutional:  He is oriented to person, place, and time. No distress.  Abdominal: Soft. There is no tenderness.  Musculoskeletal:  Minimal lower leg edema bilaterally.  Neurological: He is alert and oriented to person, place, and time.  Skin: He is not diaphoretic.  Skin irritation corresponding to the region of previous tape placement around the biliary drainage catheter. No evidence of leakage of bile or fluid around the pre-existing biliary drain.  Vitals reviewed.   Imaging: Ct Abdomen Pelvis W Contrast  Result Date: 11/11/2016 CLINICAL DATA:  History of biliary obstruction, post failed attempted endoscopic biliary stent placement, post successful percutaneous biliary drainage catheter placement on 10/25/2016 (performed by Dr. 10/27/2016). Patient underwent subsequent percutaneous biliary catheter exchange and biliary brush biopsy on 10/28/2016 with pathology compatible with adenocarcinoma. Bilirubin level obtained yesterday demonstrated improvement though persistently elevated bilirubin of 8.0, previously, 12.0 days ago. EXAM: CT ABDOMEN AND PELVIS WITH CONTRAST TECHNIQUE: Multidetector CT imaging of the abdomen and pelvis was performed using the standard protocol following bolus administration of intravenous contrast. CONTRAST:  100 Isovue 300 COMPARISON:  Fluoroscopic guided percutaneous biliary drainage catheter placement - 10/25/2016; percutaneous biliary catheter exchange and brush biopsy - 10/28/2016 CT abdomen and pelvis - 10/22/2016; chest CT - 08/16/2012 FINDINGS: Lower chest: Limited visualization of the lower thorax demonstrates minimal subsegmental atelectasis within image right lower lobe. No focal airspace opacities. No pleural effusion. Cardiomegaly. There is fusiform aneurysmal dilatation of the aortic root measuring approximately 5.5 cm in diameter. No pericardial effusion. Hepatobiliary: Post transhepatic biliary drainage catheter placement with end coiled and locked within the duodenum. Interval  resolution of previously noted intrahepatic biliary duct dilatation. There is a small amount of pneumobilia within the nondependent portion of the left lobe of the liver. No discrete hepatic lesions. Punctate (approximately 0.5 cm) suspected gallstone within the fundus of an otherwise normal-appearing gallbladder. No ascites. Pancreas: Normal appearance of the pancreas. No definitive pancreatic atrophy or pancreatic duct dilatation. Spleen: Punctate (approximately 0.5 cm) hypoattenuating lesion within the dome 1 otherwise normal-appearing spleen. Adrenals/Urinary Tract: There is symmetric enhancement of the bilateral kidneys. Note is made of an approximately 4.8 cm hypoattenuating (15 Hounsfield unit) cyst arising from the superior pole of the right kidney as well as a smaller (approximately 1.2 cm hypoattenuating (time Hounsfield unit) cyst within the interpolar aspect the left kidney. No definite renal stones this postcontrast examination. No discrete renal lesions. No urinary obstruction or perinephric stranding. Normal appearance of bilateral adrenal glands. Normal appearance of the urinary bladder given degree of distention. Stomach/Bowel: Moderate colonic stool burden without  evidence of enteric obstruction. Normal appearance of the terminal ileum and appendix. No pneumoperitoneum, pneumatosis or portal venous gas. Vascular/Lymphatic: Large amount of eccentric mixed calcified and noncalcified atherosclerotic plaque throughout the abdominal aorta. No evidence of abdominal aortic dissection or periaortic stranding. Suspected hemodynamically significant narrowing involving the origin of the celiac artery, incompletely evaluated. No bulky retroperitoneal, mesenteric, pelvic or inguinal lymphadenopathy. Reproductive: No free fluid in the pelvic cul-de-sac. Other: Small mesenteric fat containing left-sided inguinal hernia. Musculoskeletal: No acute or aggressive osseous abnormalities. Moderate multilevel lumbar  spine DDD, worse at L3-L4 and L4-L5 with disc space height loss, endplate irregularity and sclerosis. IMPRESSION: 1. Post transhepatic biliary drainage catheter placement with resolution of previously noted intrahepatic biliary duct dilatation. 2. No evidence of metastatic disease within the abdomen or pelvis. 3. Incidentally noted though incompletely evaluated aneurysmal dilatation of the aortic root measuring approximately 5.5 cm in diameter. 4.  Aortic Atherosclerosis (ICD10-170.0) PLAN: - Given patient's persistently elevated bilirubin, the patient's biliary drainage catheter was continued to external drainage. - The patient was encouraged to keep his follow-up appoint with Dr. Barry Dienes (scheduled for tomorrow) regarding his ultimate surgical candidacy. If the patient is not deemed an operative candidate, he could be considered for transhepatic biliary stent placement 4-6 weeks after initial percutaneous drainage catheter placement (early next month). Electronically Signed   By: Sandi Mariscal M.D.   On: 11/11/2016 14:20   Ir Exchange Biliary Drain  Result Date: 11/21/2016 INDICATION: 83 year old male with a history of cholangiocarcinoma. He is status post PTC and internal/external biliary drain placement. The drain is malfunctioning. EXAM: IMAGE GUIDED BILIARY DRAIN EXCHANGE MEDICATIONS: None ANESTHESIA/SEDATION: None FLUOROSCOPY TIME:  Fluoroscopy Time: 0 minutes 30 seconds (5.3 mGy). COMPLICATIONS: None PROCEDURE: Informed written consent was obtained from the patient after a thorough discussion of the procedural risks, benefits and alternatives. All questions were addressed. Maximal Sterile Barrier Technique was utilized including caps, mask, sterile gowns, sterile gloves, sterile drape, hand hygiene and skin antiseptic. A timeout was performed prior to the initiation of the procedure. Patient positioned supine position on the fluoroscopy table. Scout images were acquired. The drain was sterilely injected  with images acquired. The drain was ligated and using modified Seldinger technique a new 10 [place] internal/ external biliary drain was placed. 1% lidocaine was used to anesthetize the skin and the drain was sutured in position. Injection confirmed location of the drain and patency. The drain was capped for internal drainage. Patient tolerated the procedure well and remained hemodynamically stable throughout. No complications encountered and no significant blood loss. IMPRESSION: Status post exchange of internal/external biliary drain with placement of a new 10 French drain internally. Drain was capped for internal drainage. Signed, Dulcy Fanny. Earleen Newport, DO [facility] Electronically Signed   By: Corrie Mckusick D.O.   On: 11/21/2016 16:37    Labs:  CBC:  Recent Labs  10/30/16 1144 11/04/16 11/10/16 1207 11/24/16 1127  WBC 7.2 11.4 7.1 5.9  HGB 11.2* 12.5* 12.6* 11.9*  HCT 33.3* 38* 37.6* 36.6*  PLT 135* 161 167 100*    COAGS:  Recent Labs  10/23/16 0607 10/24/16 0731 10/25/16 0520 10/26/16 0703  INR 4.62* 1.25 1.01 0.96    BMP:  Recent Labs  10/27/16 0831 10/29/16 0514 10/30/16 0451 10/31/16 0524 11/04/16 11/10/16 11/10/16 1207 11/24/16 1128  NA 141 139 139 138 139 137 137 137  K 3.7 3.5 4.0 3.8 4.5 4.5 4.5 5.3 no hemolysis noted*  CL 108 108 110 108  --   --   --   --  CO2 '26 24 24 22  '$ --   --  20* 25  GLUCOSE 128* 103* 104* 107*  --   --  106 136  BUN 24* 27* 29* 30* 45* 50* 48.8* 24.9  CALCIUM 8.5* 8.4* 8.4* 8.6*  --   --  9.1 9.0  CREATININE 1.12 1.11 1.16 1.17 1.2 1.4* 1.6* 1.4*  GFRNONAA 59* 60* 57* 56*  --   --   --   --   GFRAA >60 >60 >60 >60  --   --   --   --     LIVER FUNCTION TESTS:  Recent Labs  10/30/16 0451 10/31/16 0524 11/04/16 11/10/16 1207 11/24/16 1128  BILITOT 11.1* 10.2*  --  8.05* 3.26*  AST 56* 64* 79* 87* 58*  ALT 69* 76* 93* 122* 92*  ALKPHOS 212* 205* 294* 282* 288*  PROT  5.4* 5.3*  --  6.7 6.0*  ALBUMIN 2.2* 2.2*  --  3.1* 2.8*    TUMOR MARKERS:  Recent Labs  10/23/16 1535 10/30/16 1504  CEA  --  3.8  CA199 1,581*  --     Assessment and Plan:  I met with Jimmy Myers and his 2 sons today. It does appear that he is tolerating capping of his biliary drainage catheter well and without symptoms. Bilirubin continues to decrease and is down to 3. CT today shows no significant earlier obstruction. Left-sided intrahepatic ducts are mildly more prominent in caliber compared to a CT on 11/11/2016. I recommended that he increase flushing of the catheter to daily flushes.  Based on cholangiogram images from the catheter exchange on 3/23, it appears that the residual biliary stricture is beyond the confluence of right and left-sided intrahepatic ducts and should be amenable to placement of a permanent covered metallic biliary stent. I recommended that Jimmy Myers see Dr. Irene Limbo and Lisbeth Renshaw next week to come up with a treatment plan for his cholangiocarcinoma. We will set him up for a cholangiogram with possible internal metallic biliary stent placement shortly after those visits (week of 4/16 or 4/23) at [facility]. It is likely that a capped biliary drainage catheter will have to remain in place for at least a week or two after placement of the metallic biliary stent to make sure that it is draining well internally before abandoning percutaneous access.  The biliary drain dressing was changed today.   Electronically SignedAletta Edouard T 12/03/2016, 10:51 AM   I spent a total of 25 Minutes in face to face in clinical consultation, greater than 50% of which was counseling/coordinating care for biliary obstruction and management of a biliary drainage catheter and planned biliary stent placement.

## 2016-12-05 ENCOUNTER — Encounter: Payer: Self-pay | Admitting: Nurse Practitioner

## 2016-12-08 ENCOUNTER — Ambulatory Visit (HOSPITAL_BASED_OUTPATIENT_CLINIC_OR_DEPARTMENT_OTHER)
Admission: RE | Admit: 2016-12-08 | Discharge: 2016-12-08 | Disposition: A | Discharge status: Home / Self Care | Payer: PPO | Source: Ambulatory Visit | Attending: Radiation Oncology | Admitting: Radiation Oncology

## 2016-12-08 ENCOUNTER — Ambulatory Visit (INDEPENDENT_AMBULATORY_CARE_PROVIDER_SITE_OTHER): Payer: PPO | Admitting: Nurse Practitioner

## 2016-12-08 ENCOUNTER — Ambulatory Visit
Admission: RE | Admit: 2016-12-08 | Discharge: 2016-12-08 | Disposition: A | Discharge status: Home / Self Care | Payer: PPO | Source: Ambulatory Visit | Attending: Radiation Oncology | Admitting: Radiation Oncology

## 2016-12-08 ENCOUNTER — Encounter: Payer: Self-pay | Admitting: Nurse Practitioner

## 2016-12-08 ENCOUNTER — Encounter: Payer: Self-pay | Admitting: Radiation Oncology

## 2016-12-08 VITALS — BP 129/73 | HR 79 | Temp 98.2°F | Resp 20 | Ht 70.0 in | Wt 203.8 lb

## 2016-12-08 VITALS — BP 118/70 | HR 80 | Ht 70.0 in | Wt 204.8 lb

## 2016-12-08 DIAGNOSIS — Z8249 Family history of ischemic heart disease and other diseases of the circulatory system: Secondary | ICD-10-CM | POA: Diagnosis not present

## 2016-12-08 DIAGNOSIS — R079 Chest pain, unspecified: Secondary | ICD-10-CM | POA: Diagnosis not present

## 2016-12-08 DIAGNOSIS — Z800 Family history of malignant neoplasm of digestive organs: Secondary | ICD-10-CM | POA: Insufficient documentation

## 2016-12-08 DIAGNOSIS — I4891 Unspecified atrial fibrillation: Secondary | ICD-10-CM | POA: Insufficient documentation

## 2016-12-08 DIAGNOSIS — Z7901 Long term (current) use of anticoagulants: Secondary | ICD-10-CM | POA: Insufficient documentation

## 2016-12-08 DIAGNOSIS — C221 Intrahepatic bile duct carcinoma: Secondary | ICD-10-CM

## 2016-12-08 DIAGNOSIS — I509 Heart failure, unspecified: Secondary | ICD-10-CM | POA: Insufficient documentation

## 2016-12-08 DIAGNOSIS — Z510 Encounter for antineoplastic radiation therapy: Secondary | ICD-10-CM | POA: Diagnosis not present

## 2016-12-08 DIAGNOSIS — K831 Obstruction of bile duct: Secondary | ICD-10-CM | POA: Diagnosis not present

## 2016-12-08 DIAGNOSIS — I480 Paroxysmal atrial fibrillation: Secondary | ICD-10-CM | POA: Diagnosis not present

## 2016-12-08 DIAGNOSIS — Z9889 Other specified postprocedural states: Secondary | ICD-10-CM | POA: Insufficient documentation

## 2016-12-08 DIAGNOSIS — F419 Anxiety disorder, unspecified: Secondary | ICD-10-CM | POA: Diagnosis not present

## 2016-12-08 DIAGNOSIS — Z888 Allergy status to other drugs, medicaments and biological substances status: Secondary | ICD-10-CM | POA: Insufficient documentation

## 2016-12-08 DIAGNOSIS — Z79899 Other long term (current) drug therapy: Secondary | ICD-10-CM | POA: Diagnosis not present

## 2016-12-08 DIAGNOSIS — I5022 Chronic systolic (congestive) heart failure: Secondary | ICD-10-CM

## 2016-12-08 DIAGNOSIS — I110 Hypertensive heart disease with heart failure: Secondary | ICD-10-CM | POA: Insufficient documentation

## 2016-12-08 DIAGNOSIS — K219 Gastro-esophageal reflux disease without esophagitis: Secondary | ICD-10-CM | POA: Diagnosis not present

## 2016-12-08 DIAGNOSIS — Z960 Presence of urogenital implants: Secondary | ICD-10-CM | POA: Diagnosis not present

## 2016-12-08 NOTE — Progress Notes (Signed)
Please see the Nurse Progress Note in the MD Initial Consult Encounter for this patient. 

## 2016-12-08 NOTE — Progress Notes (Signed)
Radiation Oncology         [phone]) [phone] ________________________________  Name: Jimmy Myers MRN: [id number]  Date: 12/08/2016  DOB: 04-20-1932  [id number] Wendi Snipes, MD  Brunetta Genera, MD     REFERRING PHYSICIAN: Brunetta Genera, MD   DIAGNOSIS: The primary encounter diagnosis was Malignant neoplasm of intrahepatic bile ducts ([place]). A diagnosis of Cholangiocarcinoma ([place]) was also pertinent to this visit.   HISTORY OF PRESENT ILLNESS::Jimmy Myers is a 83 y.o. male who is seen for an initial consultation visit. He presented to the ED in late February with progressive jaundice. ERCP was unsuccessful for bilary drainage due to high-grade obstruction of the common bile duct/ common hepatic duct centrally. He underwent placement of a 10 French percutaneous internal/external bilary drainage catheter by Dr. Annamaria Boots on 10/25/16. Endoluminal brush biopsy at the level of common bile duct obstruction was performed on 10/28/16 revealing adenocarcinoma with diagnosis consistent with cholangiocarcinoma.   The biliary drainage catheter was exchanged on 11/21/16 by Dr. Earleen Newport due to some drainage around the skin exit site. The drain was capped at that time for a trial of internal drainage. Latest bilirubin was 3.26 which is down from a highest level of 12. He is currently flushing the biliary drain with 10 mL of sterile saline every other day.  Patient was seen by Dr. Kathlene Cote on 12/03/16. Per his report, the patient appeared as though he was tolerating capping of his biliary drainage catheter well and without symptoms. CT scan from 12/03/16 showed no significant earlier obstruction. The left-sided intrahepatic ducts were mildly more prominent in caliber compared to a CT on 11/11/16.  Patient notes a 5-10 pound weight loss, nocturia every 2 hours, pain associated with biliary tube on the right side, occasional pain across the chest, and edema in bilateral feet. Patient walks with a cane due to his knees  buckling and a history of falling. Patient denies issues with his bowel/bladder, or nausea/ vomiting at this time.  Of note, patient has a history of Waldenstrom's macroglobulinemia/lymphoplasmacytic lymphoma treated in 2016 by Dr. Elyn Aquas at the [facility] in [place], [place]. He was treated with Rituxan. His last follow up was in December 2017.   PREVIOUS RADIATION THERAPY: No   PAST MEDICAL HISTORY:  has a past medical history of A-fib [facility]) (May 2013); Anxiety; Aortic root enlargement ([place]); Arthritis; Atrial thrombus; Bradycardia; Bronchitis; Cancer ([place]) (2016); Chronic anticoagulation; Congestive heart failure (CHF) ([place]) (2013); Diverticulosis; Dysrhythmia; GERD (gastroesophageal reflux disease); Hemorrhoids; HTN (hypertension); Pneumonia (May 2013); Pneumonia (2013); Systolic congestive heart failure with reduced left ventricular function, NYHA class 1 ([place]); and Thrombocytopenia ([place]).     PAST SURGICAL HISTORY: Past Surgical History:  Procedure Laterality Date  . CARDIOVERSION  12/30/2011   Procedure: CARDIOVERSION;  Surgeon: Lelon Perla, MD;  Location: [facility] ENDOSCOPY;  Service: Cardiovascular;  Laterality: N/A;  . CARDIOVERSION  02/16/2012   Procedure: CARDIOVERSION;  Surgeon: Lelon Perla, MD;  Location: [facility] ENDOSCOPY;  Service: Cardiovascular;  Laterality: N/A;  . CATARACT EXTRACTION W/PHACO Left 07/28/2016   Procedure: CATARACT EXTRACTION PHACO AND INTRAOCULAR LENS PLACEMENT LEFT EYE:  CDE: 13.00;  Surgeon: Tonny Branch, MD;  Location: [facility] ORS;  Service: Ophthalmology;  Laterality: Left;  . ENDOSCOPIC RETROGRADE CHOLANGIOPANCREATOGRAPHY (ERCP) WITH PROPOFOL N/A 10/24/2016   Procedure: ENDOSCOPIC RETROGRADE CHOLANGIOPANCREATOGRAPHY (ERCP) WITH PROPOFOL;  Surgeon: Doran Stabler, MD;  Location: [facility] ENDOSCOPY;  Service: Endoscopy;  Laterality: N/A;  . ESOPHAGOGASTRODUODENOSCOPY  12/30/2011   Procedure: ESOPHAGOGASTRODUODENOSCOPY (EGD);  Surgeon: Lelon Perla,  MD;  Location: [facility] ENDOSCOPY;   Service: Cardiovascular;  Laterality: N/A;  . ESOPHAGOGASTRODUODENOSCOPY  12/30/2011   Procedure: ESOPHAGOGASTRODUODENOSCOPY (EGD);  Surgeon: Lafayette Dragon, MD;  Location: [facility] ENDOSCOPY;  Service: Endoscopy;  Laterality: N/A;  . IR GENERIC HISTORICAL  10/25/2016   IR INT EXT BILIARY DRAIN WITH CHOLANGIOGRAM 10/25/2016 Greggory Keen, MD [facility]-INTERV RAD  . IR GENERIC HISTORICAL  10/28/2016   IR EXCHANGE BILIARY DRAIN 10/28/2016 Greggory Keen, MD [facility]  . IR GENERIC HISTORICAL  10/28/2016   IR ENDOLUMINAL BX OF BILIARY TREE 10/28/2016 Greggory Keen, MD [facility]  . IR GENERIC HISTORICAL  11/21/2016   IR EXCHANGE BILIARY DRAIN 11/21/2016 Corrie Mckusick, DO [facility]-INTERV RAD  . KNEE ARTHROSCOPY  rt knee  . PROSTATE SURGERY    . TEE WITHOUT CARDIOVERSION  12/30/2011   Procedure: TRANSESOPHAGEAL ECHOCARDIOGRAM (TEE);  Surgeon: Lelon Perla, MD;  Location: [facility] ENDOSCOPY;  Service: Cardiovascular;  Laterality: N/A;  . TEE WITHOUT CARDIOVERSION  02/16/2012   Procedure: TRANSESOPHAGEAL ECHOCARDIOGRAM (TEE);  Surgeon: Lelon Perla, MD;  Location: [facility] ENDOSCOPY;  Service: Cardiovascular;  Laterality: N/A;     FAMILY HISTORY: family history includes Heart disease in his brother, father, mother, and sister; Hypertension in his sister; Stomach cancer in his brother.   SOCIAL HISTORY:  reports that he has never smoked. He has never used smokeless tobacco. He reports that he does not drink alcohol or use drugs.   ALLERGIES: Codeine; Simvastatin; and Vicodin [hydrocodone-acetaminophen]   MEDICATIONS:  Current Outpatient Prescriptions  Medication Sig Dispense Refill  . albuterol (PROVENTIL HFA;VENTOLIN HFA) 108 (90 BASE) MCG/ACT inhaler Inhale 2 puffs into the lungs every 6 (six) hours as needed for wheezing or shortness of breath.    . clorazepate (TRANXENE) 3.75 MG tablet Give .05 tablet by mouth daily as needed for anxiety    . fluticasone (FLONASE) 50 MCG/ACT nasal spray Place 1 spray into both  nostrils daily as needed for allergies or rhinitis.    . folic acid (FOLVITE) 353 MCG tablet Take 800 mcg by mouth daily.    . furosemide (LASIX) 40 MG tablet TAKE ONE TABLET BY MOUTH ONCE DAILY 90 tablet 2  . omeprazole (PRILOSEC) 20 MG capsule Take 20 mg by mouth daily before breakfast.     . polyethylene glycol (MIRALAX / GLYCOLAX) packet Take 17 g by mouth at bedtime as needed.     . potassium chloride SA (K-DUR,KLOR-CON) 20 MEQ tablet Take 20 mEq by mouth 2 (two) times daily.    . nitroGLYCERIN (NITROSTAT) 0.4 MG SL tablet Place 1 tablet (0.4 mg total) under the tongue every 5 (five) minutes x 3 doses as needed for chest pain. 25 tablet 6   No current facility-administered medications for this encounter.      REVIEW OF SYSTEMS:  A complete review of systems was reviewed with the patient and pertinent positives findings are noted in the history of present illness.     PHYSICAL EXAM:  height is '5\' 10"'$  (1.778 m) and weight is 203 lb 12.8 oz (92.4 kg). His oral temperature is 98.2 F (36.8 C). His blood pressure is 129/73 and his pulse is 79. His respiration is 20 and oxygen saturation is 100%.   ECOG = 1  0 - Asymptomatic (Fully active, able to carry on all predisease activities without restriction)  1 - Symptomatic but completely ambulatory (Restricted in physically strenuous activity but ambulatory and able to carry out work of a light or sedentary nature. For example, light  housework, office work)  2 - Symptomatic, <50% in bed during the day (Ambulatory and capable of all self care but unable to carry out any work activities. Up and about more than 50% of waking hours)  3 - Symptomatic, >50% in bed, but not bedbound (Capable of only limited self-care, confined to bed or chair 50% or more of waking hours)  4 - Bedbound (Completely disabled. Cannot carry on any self-care. Totally confined to bed or chair)  5 - Death   Eustace Pen MM, Creech RH, Tormey DC, et al. [phone]). "Toxicity and  response criteria of the [facility]". [place] Oncol. 5 (6): 649-55  General: Well-developed, in no acute distress HEENT: Normocephalic, atraumatic Cardiovascular: Regular rate and rhythm Respiratory: Clear to auscultation bilaterally GI: Soft, nontender, normal bowel sounds Extremities: No edema present Bandaged drain in the right abdomen noted.      LABORATORY DATA:  Lab Results  Component Value Date   WBC 5.9 11/24/2016   HGB 11.9 (L) 11/24/2016   HCT 36.6 (L) 11/24/2016   MCV 98.4 (H) 11/24/2016   PLT 100 (L) 11/24/2016   Lab Results  Component Value Date   NA 137 11/24/2016   K 5.3 no hemolysis noted (H) 11/24/2016   CL 108 10/31/2016   CO2 25 11/24/2016   Lab Results  Component Value Date   ALT 92 (H) 11/24/2016   AST 58 (H) 11/24/2016   ALKPHOS 288 (H) 11/24/2016   BILITOT 3.26 (H) 11/24/2016      RADIOGRAPHY: Ct Abdomen Pelvis W Contrast  Result Date: 12/03/2016 CLINICAL DATA:  Cholangiocarcinoma and status post placement of percutaneous internal/ external biliary drainage catheter on 10/25/2016. This catheter was exchanged on 11/21/2016 and capped for a trial of internal drainage. EXAM: CT ABDOMEN AND PELVIS WITH CONTRAST TECHNIQUE: Multidetector CT imaging of the abdomen and pelvis was performed using the standard protocol following bolus administration of intravenous contrast. CONTRAST:  133m ISOVUE-300 IOPAMIDOL (ISOVUE-300) INJECTION 61% COMPARISON:  11/11/2016 CT, cholangiogram image on 11/21/2016 and MRI of the abdomen on 10/23/2016 FINDINGS: Lower chest: Stable bibasilar scarring. Hepatobiliary: Stable and appropriate positioning of internal/ external biliary drainage catheter which is coiled in the duodenal lumen. Since a prior scan on 03/13, there is mild dilatation of left lobe intrahepatic ducts. Right lobe ducts are completely decompressed. No significant common duct dilatation. Stable gallbladder distension without evidence of  gallbladder wall thickening or inflammation. There remains inability by CT to define any discrete intrahepatic or extrahepatic obstructing mass. Pancreas: Unremarkable. No pancreatic ductal dilatation or surrounding inflammatory changes. Spleen: Normal in size without focal abnormality. Adrenals/Urinary Tract: Adrenal glands are unremarkable. Kidneys are normal, without renal calculi, focal lesion, or hydronephrosis. Bladder is unremarkable. Stomach/Bowel: No bowel obstruction, ileus or free air. Vascular/Lymphatic: No significant vascular findings are present. No enlarged abdominal or pelvic lymph nodes. Other: No ascites or abnormal fluid collections. Musculoskeletal: Stable degenerative disc disease of the lumbar spine. IMPRESSION: After capping of the internal/ external biliary drainage catheter, there is some mild dilatation of left lobe intrahepatic ducts by CT. Right lobe ducts remain completely decompressed. There remains no discrete intrahepatic mass definable by CT at the level of biliary obstruction. Electronically Signed   By: GAletta EdouardM.D.   On: 12/03/2016 11:14   Ct Abdomen Pelvis W Contrast  Result Date: 11/11/2016 CLINICAL DATA:  History of biliary obstruction, post failed attempted endoscopic biliary stent placement, post successful percutaneous biliary drainage catheter placement on 10/25/2016 (performed by Dr. SAnnamaria Boots. Patient  underwent subsequent percutaneous biliary catheter exchange and biliary brush biopsy on 10/28/2016 with pathology compatible with adenocarcinoma. Bilirubin level obtained yesterday demonstrated improvement though persistently elevated bilirubin of 8.0, previously, 12.0 days ago. EXAM: CT ABDOMEN AND PELVIS WITH CONTRAST TECHNIQUE: Multidetector CT imaging of the abdomen and pelvis was performed using the standard protocol following bolus administration of intravenous contrast. CONTRAST:  100 Isovue 300 COMPARISON:  Fluoroscopic guided percutaneous biliary drainage  catheter placement - 10/25/2016; percutaneous biliary catheter exchange and brush biopsy - 10/28/2016 CT abdomen and pelvis - 10/22/2016; chest CT - 08/16/2012 FINDINGS: Lower chest: Limited visualization of the lower thorax demonstrates minimal subsegmental atelectasis within image right lower lobe. No focal airspace opacities. No pleural effusion. Cardiomegaly. There is fusiform aneurysmal dilatation of the aortic root measuring approximately 5.5 cm in diameter. No pericardial effusion. Hepatobiliary: Post transhepatic biliary drainage catheter placement with end coiled and locked within the duodenum. Interval resolution of previously noted intrahepatic biliary duct dilatation. There is a small amount of pneumobilia within the nondependent portion of the left lobe of the liver. No discrete hepatic lesions. Punctate (approximately 0.5 cm) suspected gallstone within the fundus of an otherwise normal-appearing gallbladder. No ascites. Pancreas: Normal appearance of the pancreas. No definitive pancreatic atrophy or pancreatic duct dilatation. Spleen: Punctate (approximately 0.5 cm) hypoattenuating lesion within the dome 1 otherwise normal-appearing spleen. Adrenals/Urinary Tract: There is symmetric enhancement of the bilateral kidneys. Note is made of an approximately 4.8 cm hypoattenuating (15 Hounsfield unit) cyst arising from the superior pole of the right kidney as well as a smaller (approximately 1.2 cm hypoattenuating (time Hounsfield unit) cyst within the interpolar aspect the left kidney. No definite renal stones this postcontrast examination. No discrete renal lesions. No urinary obstruction or perinephric stranding. Normal appearance of bilateral adrenal glands. Normal appearance of the urinary bladder given degree of distention. Stomach/Bowel: Moderate colonic stool burden without evidence of enteric obstruction. Normal appearance of the terminal ileum and appendix. No pneumoperitoneum, pneumatosis or  portal venous gas. Vascular/Lymphatic: Large amount of eccentric mixed calcified and noncalcified atherosclerotic plaque throughout the abdominal aorta. No evidence of abdominal aortic dissection or periaortic stranding. Suspected hemodynamically significant narrowing involving the origin of the celiac artery, incompletely evaluated. No bulky retroperitoneal, mesenteric, pelvic or inguinal lymphadenopathy. Reproductive: No free fluid in the pelvic cul-de-sac. Other: Small mesenteric fat containing left-sided inguinal hernia. Musculoskeletal: No acute or aggressive osseous abnormalities. Moderate multilevel lumbar spine DDD, worse at L3-L4 and L4-L5 with disc space height loss, endplate irregularity and sclerosis. IMPRESSION: 1. Post transhepatic biliary drainage catheter placement with resolution of previously noted intrahepatic biliary duct dilatation. 2. No evidence of metastatic disease within the abdomen or pelvis. 3. Incidentally noted though incompletely evaluated aneurysmal dilatation of the aortic root measuring approximately 5.5 cm in diameter. 4.  Aortic Atherosclerosis (ICD10-170.0) PLAN: - Given patient's persistently elevated bilirubin, the patient's biliary drainage catheter was continued to external drainage. - The patient was encouraged to keep his follow-up appoint with Dr. Barry Dienes (scheduled for tomorrow) regarding his ultimate surgical candidacy. If the patient is not deemed an operative candidate, he could be considered for transhepatic biliary stent placement 4-6 weeks after initial percutaneous drainage catheter placement (early next month). Electronically Signed   By: Sandi Mariscal M.D.   On: 11/11/2016 14:20   Ir Exchange Biliary Drain  Result Date: 11/21/2016 INDICATION: 83 year old male with a history of cholangiocarcinoma. He is status post PTC and internal/external biliary drain placement. The drain is malfunctioning. EXAM: IMAGE GUIDED BILIARY DRAIN EXCHANGE MEDICATIONS:  None  ANESTHESIA/SEDATION: None FLUOROSCOPY TIME:  Fluoroscopy Time: 0 minutes 30 seconds (5.3 mGy). COMPLICATIONS: None PROCEDURE: Informed written consent was obtained from the patient after a thorough discussion of the procedural risks, benefits and alternatives. All questions were addressed. Maximal Sterile Barrier Technique was utilized including caps, mask, sterile gowns, sterile gloves, sterile drape, hand hygiene and skin antiseptic. A timeout was performed prior to the initiation of the procedure. Patient positioned supine position on the fluoroscopy table. Scout images were acquired. The drain was sterilely injected with images acquired. The drain was ligated and using modified Seldinger technique a new 10 [place] internal/ external biliary drain was placed. 1% lidocaine was used to anesthetize the skin and the drain was sutured in position. Injection confirmed location of the drain and patency. The drain was capped for internal drainage. Patient tolerated the procedure well and remained hemodynamically stable throughout. No complications encountered and no significant blood loss. IMPRESSION: Status post exchange of internal/external biliary drain with placement of a new 10 French drain internally. Drain was capped for internal drainage. Signed, Dulcy Fanny. Earleen Newport, DO [facility] Electronically Signed   By: Corrie Mckusick D.O.   On: 11/21/2016 16:37       IMPRESSION: 83 year old gentleman with cholangiocarcinoma and status post percutaneous biliary drainage with internal/external biliary drainage catheter placement on 10/25/16. The patient does not want to proceed with surgery at this time, and he has discussed this with Dr. Barry Dienes. His options of treatment now include focused radiation or chemoradiation. We discussed potential options in terms of radiation treatment. We discussed a shorter course of stereotactic body radiation treatment and contrasted  this to a longer, more traditional 5-1/2 week course of treatment. We also discussed foregoing any radiation treatment at this time.    PLAN: After this discussion, the patient would like to proceed with a course of stereotactic body radiation treatment He does see medical oncology later this week. We will tentatively plan to proceed with treatment planning at the end of the week, pending this discussion. I discussed with the patient that this treatment could be completed with or without systemic treatment, although we would not use both modalities concurrently. A CT simulation and treatment planning appointment will be scheduled. In terms of radiation treatment, I would anticipate treating the patient to a dose of 33 [place] in 5 fractions. The patient will meet with Dr. Irene Limbo on 12/11/16.       ________________________________   Jodelle Gross, MD, PhD   **Disclaimer: This note was dictated with voice recognition software. Similar sounding words can inadvertently be transcribed and this note may contain transcription errors which may not have been corrected upon publication of note.**   This document serves as a record of services personally performed by Kyung Rudd, MD. It was created on his behalf by Bethann Humble, a trained medical scribe. The creation of this record is based on the scribe's personal observations and the provider's statements to them. This document has been checked and approved by the attending provider.

## 2016-12-08 NOTE — Patient Instructions (Addendum)
We will be checking the following labs today - BMET, HPF and BNP   Medication Instructions:    Continue with your current medicines.     Testing/Procedures To Be Arranged:  N/A  Follow-Up:   See me in 3 months    Other Special Instructions:   Get back on low salt diet and wearing support stockings.     If you need a refill on your cardiac medications before your next appointment, please call your pharmacy.   Call the [place] office at [phone] if you have any questions, problems or concerns.

## 2016-12-08 NOTE — Progress Notes (Signed)
CARDIOLOGY OFFICE NOTE  Date:  12/08/2016    Jimmy Myers Date of Birth: 11-Nov-1931 Medical Record #[id number]  PCP:  Kenn File, MD  Cardiologist:  [name]   Chief Complaint  Patient presents with  . Atrial Fibrillation    Work in visit - seen for Dr. [name]    History of Present Illness: Jimmy Myers is a 82 y.o. male who presents today for a work in visit. Seen for Dr. [name].   He has systolic heart failure with an EF previously down to 20 to 25% with improvement after medical therapy - up to 50% per echo in August of 2013 and was 45 to 50% by echo in July of 2014. His other issues include atrial fib with prior cardioversion, bradycardia, enlarged ascending aorta (previously followed by Dr. [name]), thyroid nodules (with benign biopsy), chronic thrombocytopenia and HTN. Had an event monitor in 2014 - average HR of 61. 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 Coumadin was followed by the [place].  I have seen him several times over the past few years - he has done ok. He has been reluctant to change his medicines. Echo updated in May of 2017 and was unchanged - EF 40 to 45% - we have continued with medical therapy. He has opted to not have any more imaging of his aorta. At last visit in November, HR was lower and I stopped the beta blocker altogether.   I last saw him in February - he had had the flu - he was very jaundiced. LFTs high. Coumadin running high.   He ended up getting CT scan abdomen/pelvis which showed moderate intrahepatic biliary ductal dilation, mild distention of the gallbladder, distention of the CBD suspicious for CBD or pancreatic carcinoma. ERCP was unsuccessful. External biliary drainage was placed by IR. CA 19-9 elevated. 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  He was referred for oncology. Was sent to SNF from the hospital. Noted to be a DNR. His coumadin remains on hold.   I cannot see any recent dictation from oncology in EPIC. Looks like he is seeing Dr. Lisbeth Renshaw later today.   Comes in today. Here with his wife. Says he has seen oncology. Seeing radiation oncology as well - looks like he will do radiation, not sure about the chemo yet but this is possible. Still has the drain in place - but "no bag". He notes some swelling in his legs but sounds like he has really had lots of sodium indiscretion. No chest pain. Rhythm has been stable. Remains OFF coumadin.   Past Medical History:  Diagnosis Date  . A-fib [facility]) May 2013   s/p TEE/cardioversion June 2013  . Anxiety   . Aortic root enlargement (HCC)    at least 5cm by CT  . Arthritis   . Atrial thrombus   . Bradycardia   . Bronchitis   . Cancer ([place]) 2016   lymphoma  . Chronic anticoagulation    on coumadin - checked in [place]  . Congestive heart failure (CHF) ([place]) 2013  . Diverticulosis   . Dysrhythmia    AFib  . GERD (gastroesophageal reflux disease)   . Hemorrhoids   . HTN (hypertension)   . Pneumonia May 2013  . Pneumonia 2013  . 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 Thrombocytopenia ([place])     Past Surgical History:  Procedure Laterality Date  . CARDIOVERSION  12/30/2011   Procedure: CARDIOVERSION;  Surgeon: Lelon Perla, MD;  Location: [facility] ENDOSCOPY;  Service: Cardiovascular;  Laterality: N/A;  . CARDIOVERSION  02/16/2012   Procedure: CARDIOVERSION;  Surgeon: Lelon Perla, MD;  Location: [facility] ENDOSCOPY;  Service: Cardiovascular;  Laterality: N/A;  . CATARACT EXTRACTION W/PHACO Left 07/28/2016   Procedure: CATARACT EXTRACTION PHACO AND INTRAOCULAR LENS PLACEMENT LEFT EYE:  CDE: 13.00;  Surgeon: Tonny Branch, MD;  Location: [facility] ORS;  Service:  Ophthalmology;  Laterality: Left;  . ENDOSCOPIC RETROGRADE CHOLANGIOPANCREATOGRAPHY (ERCP) WITH PROPOFOL N/A 10/24/2016   Procedure: ENDOSCOPIC RETROGRADE CHOLANGIOPANCREATOGRAPHY (ERCP) WITH PROPOFOL;  Surgeon: Doran Stabler, MD;  Location: [facility] ENDOSCOPY;  Service: Endoscopy;  Laterality: N/A;  . ESOPHAGOGASTRODUODENOSCOPY  12/30/2011   Procedure: ESOPHAGOGASTRODUODENOSCOPY (EGD);  Surgeon: Lelon Perla, MD;  Location: [facility] ENDOSCOPY;  Service: Cardiovascular;  Laterality: N/A;  . ESOPHAGOGASTRODUODENOSCOPY  12/30/2011   Procedure: ESOPHAGOGASTRODUODENOSCOPY (EGD);  Surgeon: Lafayette Dragon, MD;  Location: [facility] ENDOSCOPY;  Service: Endoscopy;  Laterality: N/A;  . IR GENERIC HISTORICAL  10/25/2016   IR INT EXT BILIARY DRAIN WITH CHOLANGIOGRAM 10/25/2016 Greggory Keen, MD [facility]-INTERV RAD  . IR GENERIC HISTORICAL  10/28/2016   IR EXCHANGE BILIARY DRAIN 10/28/2016 Greggory Keen, MD [facility]-INTER[facility] RAD  . IR GENERIC HISTORICAL  10/28/2016   IR ENDOLUMINAL BX OF BILIARY TREE 10/28/2016 Greggory Keen, MD [facility]-INTER[facility] RAD  . IR GENERIC HISTORICAL  11/21/2016   IR EXCHANGE BILIARY DRAIN 11/21/2016 Corrie Mckusick, DO [facility]-INTERV RAD  . KNEE ARTHROSCOPY  rt knee  . PROSTATE SURGERY    . TEE WITHOUT CARDIOVERSION  12/30/2011   Procedure: TRANSESOPHAGEAL ECHOCARDIOGRAM (TEE);  Surgeon: Lelon Perla, MD;  Location: [facility] ENDOSCOPY;  Service: Cardiovascular;  Laterality: N/A;  . TEE WITHOUT CARDIOVERSION  02/16/2012   Procedure: TRANSESOPHAGEAL ECHOCARDIOGRAM (TEE);  Surgeon: Lelon Perla, MD;  Location: [facility] ENDOSCOPY;  Service: Cardiovascular;  Laterality: N/A;     Medications: Current Outpatient Prescriptions  Medication Sig Dispense Refill  . albuterol (PROVENTIL HFA;VENTOLIN HFA) 108 (90 BASE) MCG/ACT inhaler Inhale 2 puffs into the lungs every 6 (six) hours as needed for wheezing or shortness of breath.    . clorazepate (TRANXENE) 3.75 MG tablet Give .05 tablet by mouth daily as needed for anxiety    . fluticasone  (FLONASE) 50 MCG/ACT nasal spray Place 1 spray into both nostrils daily as needed for allergies or rhinitis.    . folic acid (FOLVITE) 366 MCG tablet Take 800 mcg by mouth daily.    . furosemide (LASIX) 40 MG tablet TAKE ONE TABLET BY MOUTH ONCE DAILY 90 tablet 2  . nitroGLYCERIN (NITROSTAT) 0.4 MG SL tablet Place 1 tablet (0.4 mg total) under the tongue every 5 (five) minutes x 3 doses as needed for chest pain. 25 tablet 6  . omeprazole (PRILOSEC) 20 MG capsule Take 20 mg by mouth daily before breakfast.     . polyethylene glycol (MIRALAX / GLYCOLAX) packet Take 17 g by mouth at bedtime as needed.     . potassium chloride SA (K-DUR,KLOR-CON) 20 MEQ tablet Take 20 mEq by mouth 2 (two) times daily.     No current facility-administered medications for this visit.     Allergies: Allergies  Allergen Reactions  .  Codeine Other (See Comments)    nervousness  . Simvastatin Other (See Comments)    Muscle pain/hurting  . Vicodin [Hydrocodone-Acetaminophen] Other (See Comments)    nervousness    Social History: The patient  reports that he has never smoked. He has never used smokeless tobacco. He reports that he does not drink alcohol or use drugs.   Family History: The patient's family history includes Heart disease in his brother, father, mother, and sister; Hypertension in his sister; Stomach cancer in his brother.   Review of Systems: Please see the history of present illness.   Otherwise, the review of systems is positive for none.   All other systems are reviewed and negative.   Physical Exam: VS:  BP 118/70   Pulse 80   Ht 5\' 10"  (1.778 m)   Wt 204 lb 12.8 oz (92.9 kg)   SpO2 99% Comment: at rest  BMI 29.39 kg/m  .  BMI Body mass index is 29.39 kg/m.  Wt Readings from Last 3 Encounters:  12/08/16 204 lb 12.8 oz (92.9 kg)  12/08/16 203 lb 12.8 oz (92.4 kg)  12/03/16 195 lb (88.5 kg)     General: Pleasant. Back to his talkative self today. He is alert and in no acute  distress. His weight is still down from what he weighed in November - 211 then.  Still looks chronically ill.  HEENT: Normal.  Neck: Supple, no JVD, carotid bruits, or masses noted.  Cardiac: Regular rate and rhythm. No murmurs, rubs, or gallops. Trace edema.  Respiratory:  Lungs are clear to auscultation bilaterally with normal work of breathing.  GI: Soft and nontender.  MS: No deformity or atrophy. Gait and ROM intact.  Skin: Warm and dry. Color is normal.  Neuro:  Strength and sensation are intact and no gross focal deficits noted.  Psych: Alert, appropriate and with normal affect.   LABORATORY DATA:  EKG:  EKG is ordered today. This shows NSR today.  Lab Results  Component Value Date   WBC 5.9 11/24/2016   HGB 11.9 (L) 11/24/2016   HCT 36.6 (L) 11/24/2016   PLT 100 (L) 11/24/2016   GLUCOSE 136 11/24/2016   CHOL 85 12/29/2011   TRIG 30 12/29/2011   HDL 40 12/29/2011   LDLCALC 39 12/29/2011   ALT 92 (H) 11/24/2016   AST 58 (H) 11/24/2016   NA 137 11/24/2016   K 5.3 no hemolysis noted (H) 11/24/2016   CL 108 10/31/2016   CREATININE 1.4 (H) 11/24/2016   BUN 24.9 11/24/2016   CO2 25 11/24/2016   TSH 0.498 12/28/2011   INR 0.96 10/26/2016    BNP (last 3 results) No results for input(s): BNP in the last 8760 hours.  ProBNP (last 3 results) No results for input(s): PROBNP in the last 8760 hours.   Other Studies Reviewed Today:  Echo Study Conclusions from 12/2015  - Left ventricle: The cavity size was mildly dilated. Systolic function was mildly to moderately reduced. The estimated ejection fraction was in the range of 40% to 45%. Diffuse hypokinesis. Doppler parameters are consistent with abnormal left ventricular relaxation (grade 1 diastolic dysfunction). Doppler parameters are consistent with elevated mean left atrial filling pressure. - Aortic valve: There was mild regurgitation. - Mitral valve: There was mild regurgitation. - Left atrium: The  atrium was mildly dilated.  Assessment / Plan:  1. 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 Seeing radiation oncology and oncology. Sounds like he will get radiation.  2. PAF - in sinus by exam today and by EKG. He is no longer on his coumadin and given his recent oncologic findings - would favor holding for now.  - fortunately he remains in NSR. Discussed with pharmacy here in the office today who is in agreement with this plan. LFTs remain up. Recheck today.   3. Bradycardia - No longer on any beta blocker therapy. I have suspected he has some degree of sick sinus syndrome. No indication for PPM at this time. HR is ok today.   4. Systolic heart failure - EF remains45 to 50% - his weight is still below due to recent illness - will recheck labs to include BNP - would favor sodium restriction and getting back to wearing his support stockings.   5. Enlarged aorta - previously followed by Dr. [name] - he has decided that he will not have any further evaluation and there are no plans for surgical intervention. Not discussed today   Current medicines are reviewed with the patient today.  The patient does not have concerns regarding medicines other than what has been noted above.  The following changes have been made:  See above.  Labs/ tests ordered today include:    Orders Placed This Encounter  Procedures  . Basic metabolic panel  . Hepatic function panel  . Pro b natriuretic peptide (BNP)  . EKG 12-Lead     Disposition:   FU with me in 3 months.   Patient is agreeable to this plan and will call if any problems develop in the interim.   SignedTruitt Merle, NP  12/08/2016 11:36 AM  [facility] [address] [place], [place]  [address] Phone: [phone] Fax: [phone]

## 2016-12-09 ENCOUNTER — Other Ambulatory Visit: Payer: Self-pay | Admitting: *Deleted

## 2016-12-09 DIAGNOSIS — I5022 Chronic systolic (congestive) heart failure: Secondary | ICD-10-CM

## 2016-12-09 DIAGNOSIS — E875 Hyperkalemia: Secondary | ICD-10-CM

## 2016-12-09 LAB — HEPATIC FUNCTION PANEL
ALT: 48 IU/L — ABNORMAL HIGH (ref 0–44)
AST: 38 IU/L (ref 0–40)
Albumin: 3.6 g/dL (ref 3.5–4.7)
Alkaline Phosphatase: 194 IU/L — ABNORMAL HIGH (ref 39–117)
Bilirubin Total: 1.5 mg/dL — ABNORMAL HIGH (ref 0.0–1.2)
Bilirubin, Direct: 1.06 mg/dL — ABNORMAL HIGH (ref 0.00–0.40)
Total Protein: 5.9 g/dL — ABNORMAL LOW (ref 6.0–8.5)

## 2016-12-09 LAB — BASIC METABOLIC PANEL
BUN/Creatinine Ratio: 15 (ref 10–24)
BUN: 15 mg/dL (ref 8–27)
CO2: 27 mmol/L (ref 18–29)
Calcium: 8.8 mg/dL (ref 8.6–10.2)
Chloride: 101 mmol/L (ref 96–106)
Creatinine, Ser: 0.99 mg/dL (ref 0.76–1.27)
GFR calc Af Amer: 82 mL/min/{1.73_m2} (ref >59)
GFR calc non Af Amer: 71 mL/min/{1.73_m2} (ref >59)
Glucose: 93 mg/dL (ref 65–99)
Potassium: 5.7 mmol/L — ABNORMAL HIGH (ref 3.5–5.2)
Sodium: 142 mmol/L (ref 134–144)

## 2016-12-09 LAB — PRO B NATRIURETIC PEPTIDE: NT-Pro BNP: 3713 pg/mL — ABNORMAL HIGH (ref 0–486)

## 2016-12-11 ENCOUNTER — Other Ambulatory Visit (HOSPITAL_BASED_OUTPATIENT_CLINIC_OR_DEPARTMENT_OTHER): Payer: PPO

## 2016-12-11 ENCOUNTER — Telehealth: Payer: Self-pay | Admitting: Hematology

## 2016-12-11 ENCOUNTER — Encounter: Payer: Self-pay | Admitting: Hematology

## 2016-12-11 ENCOUNTER — Ambulatory Visit (HOSPITAL_BASED_OUTPATIENT_CLINIC_OR_DEPARTMENT_OTHER): Payer: PPO | Admitting: Hematology

## 2016-12-11 VITALS — BP 133/68 | HR 100 | Temp 98.3°F | Resp 18 | Ht 70.0 in | Wt 203.6 lb

## 2016-12-11 DIAGNOSIS — C221 Intrahepatic bile duct carcinoma: Secondary | ICD-10-CM

## 2016-12-11 DIAGNOSIS — R7989 Other specified abnormal findings of blood chemistry: Secondary | ICD-10-CM | POA: Diagnosis not present

## 2016-12-11 LAB — COMPREHENSIVE METABOLIC PANEL
ALBUMIN: 3 g/dL — AB (ref 3.5–5.0)
ALK PHOS: 198 U/L — AB (ref 40–150)
ALT: 48 U/L (ref 0–55)
AST: 41 U/L — ABNORMAL HIGH (ref 5–34)
Anion Gap: 10 mEq/L (ref 3–11)
BILIRUBIN TOTAL: 1.64 mg/dL — AB (ref 0.20–1.20)
BUN: 17.2 mg/dL (ref 7.0–26.0)
CALCIUM: 8.8 mg/dL (ref 8.4–10.4)
CO2: 28 mEq/L (ref 22–29)
Chloride: 106 mEq/L (ref 98–109)
Creatinine: 1.1 mg/dL (ref 0.7–1.3)
EGFR: 63 mL/min/{1.73_m2} — AB (ref >90)
Glucose: 111 mg/dl (ref 70–140)
POTASSIUM: 3.8 meq/L (ref 3.5–5.1)
Sodium: 144 mEq/L (ref 136–145)
TOTAL PROTEIN: 6.1 g/dL — AB (ref 6.4–8.3)

## 2016-12-11 LAB — CBC & DIFF AND RETIC
BASO%: 0.2 % (ref 0.0–2.0)
Basophils Absolute: 0 10*3/uL (ref 0.0–0.1)
EOS ABS: 0.1 10*3/uL (ref 0.0–0.5)
EOS%: 2.5 % (ref 0.0–7.0)
HEMATOCRIT: 35.8 % — AB (ref 38.4–49.9)
HEMOGLOBIN: 11.6 g/dL — AB (ref 13.0–17.1)
Immature Retic Fract: 2.2 % — ABNORMAL LOW (ref 3.00–10.60)
LYMPH%: 43.8 % (ref 14.0–49.0)
MCH: 32.9 pg (ref 27.2–33.4)
MCHC: 32.4 g/dL (ref 32.0–36.0)
MCV: 101.4 fL — AB (ref 79.3–98.0)
MONO#: 0.6 10*3/uL (ref 0.1–0.9)
MONO%: 10.9 % (ref 0.0–14.0)
NEUT%: 42.6 % (ref 39.0–75.0)
NEUTROS ABS: 2.3 10*3/uL (ref 1.5–6.5)
PLATELETS: 122 10*3/uL — AB (ref 140–400)
RBC: 3.53 10*6/uL — ABNORMAL LOW (ref 4.20–5.82)
RDW: 15.2 % — ABNORMAL HIGH (ref 11.0–14.6)
Retic %: 1.75 % (ref 0.80–1.80)
Retic Ct Abs: 61.78 10*3/uL (ref 34.80–93.90)
WBC: 5.3 10*3/uL (ref 4.0–10.3)
lymph#: 2.3 10*3/uL (ref 0.9–3.3)

## 2016-12-11 NOTE — Telephone Encounter (Signed)
Gave patient AVS and calender per 4/12 los.  

## 2016-12-14 NOTE — Progress Notes (Signed)
Marland Kitchen    HEMATOLOGY/ONCOLOGY CONSULTATION NOTE  Date of Service: .11/10/2016  Patient Care Team: [name], MD as PCP - General (Family Medicine) Thompson Grayer, MD as Attending Physician (Cardiology) Donita Brooks, MD as Consulting Physician (Internal Medicine) [name], MD as Attending Physician (Family Medicine)  CHIEF COMPLAINTS/PURPOSE OF CONSULTATION:  Newly diagnosed cholangiocarcinoma  HISTORY OF PRESENTING ILLNESS:  Jimmy Myers is a wonderful 83 y.o. male who has recently seen the in the hospital for newly diagnosed cholangiocarcinoma.  Patient has a history of chronic atrial fibrillation, aortic root enlargement, osteoarthritis, history of atrial thrombus, chronic systolic heart failure, diverticulosis, GERD, hemorrhoids, hypertension, ?lymphoma with recent treatment for influenza.  He was referred to the emergency room by his primary care physician for progressively worsening jaundice with weight loss of about 5 pounds in the last week or so. No chest pain or palpitations no dizziness. No abdominal pain nausea vomiting constipation or overt GI bleeding. No hematuria or dysuria.  In the emergency room the patient was noted to supratherapeutic INR, hemoglobin of 12 hyponatremia 130, alkaline phosphatase of 562, AST of 113 ALT 144 and a total bilirubin of 8.5.  Patient subsequently had a workup with abdominal imaging including a CT of the abdomen pelvis followed by an MRI MRCP which showed Abrupt cut off of the common bile duct at or immediately beyond the junction of the common hepatic duct and cystic duct with moderate proximal biliary dilatation. The exact source of obstruction is unclear on today's examination, with differential considerations including an obstructing ductal stone (which is simply not visualized secondary to technical limitations on today's examination), ductal adenocarcinoma, or obstructing pancreatic head lesion (which is not visualized on  today's examination secondary to technical limitations). Further evaluation with ERCP is recommended.  Patient had an ERCP on 10/24/2016 which showed a severe biliary structure in the mid/distal CBD.  Patient has subsequently had a internal external biliary drain placed to relieve the biliary obstruction in the mid CBD. On 10/28/2016 the biliary drain was exchanged and endoluminal brush biopsy of the common bile duct obstruction was obtained.  Brush biopsies consistent with adenocarcinoma. We were consulted for further recommendations regarding workup and treatment of patient's newly diagnosed pancreatico-biliary adenocarcinoma. Labs showed an elevated CA-19-9 level of 1581. CEA level is pending. Patient was noted to be growing Enterobacter from his biliary fluid and is currently on antibiotics.  Patient notes that he has 1 progressively weaker since November and may have lost 5-7 pounds. Prior to this he was functioning fairly independently. Notes no significant abdominal pain at this time. Has not noticed of his stools were pale in color. Did notice that his urine looked fairly orange likely due to hyperbilirubinemia. No chest pain or shortness of breath. No other acute new symptoms.  Patient reports that he has previously had history of Waldenstrom's macroglobulinemia/lymphoplasmacytic lymphoma that was treated in 2016 by Dr. Elyn Aquas at the [facility] in [place]. He was treated with Rituxan and some additional chemotherapy. He notes that he had his last follow-up with his oncologist in December 2017. He is unsure if he would like to followup here at [place] center or with his [place] oncologist but noted that he will likely find it easier to f/u in [place], [place] (lives in [place], [place]).  INTERVAL HISTORY  Patient is here for a posthospitalization follow-up for his cholangiocarcinoma. He notes no further fevers. His liver function tests are gradually improving with biliary drainage. He has  follow-up with interventional  radiology to try to internalize his biliary stent. No further fevers or chills. He is here with multiple family members. He notes poor appetite but feels that it is improving.  We discussed goals of care in details and discussed that though his CT scan does not show overt metastatic disease we cannot rule out microscopic metastatic disease. We discussed that the first step would be to have a surgical evaluation if he is so inclined. He notes that he would like to talk to the surgeon and was therefore given a referral to Dr. Barry Dienes. He has completed his antibiotics. We discussed that based on the surgeon's input we will decide on next steps of treatment.  MEDICAL HISTORY:  Past Medical History:  Diagnosis Date  . A-fib [facility]) May 2013   s/p TEE/cardioversion June 2013  . Anxiety   . Aortic root enlargement (HCC)    at least 5cm by CT  . Arthritis   . Atrial thrombus   . Bradycardia   . Bronchitis   . Cancer ([place]) 2016   lymphoma  . Chronic anticoagulation    on coumadin - checked in [place]  . Congestive heart failure (CHF) ([place]) 2013  . Diverticulosis   . Dysrhythmia    AFib  . GERD (gastroesophageal reflux disease)   . Hemorrhoids   . HTN (hypertension)   . Pneumonia May 2013  . Pneumonia 2013  . 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 Thrombocytopenia ([place])     SURGICAL HISTORY: Past Surgical History:  Procedure Laterality Date  . CARDIOVERSION  12/30/2011   Procedure: CARDIOVERSION;  Surgeon: Lelon Perla, MD;  Location: [facility] ENDOSCOPY;  Service: Cardiovascular;  Laterality: N/A;  . CARDIOVERSION  02/16/2012   Procedure: CARDIOVERSION;  Surgeon: Lelon Perla, MD;  Location: [facility] ENDOSCOPY;  Service: Cardiovascular;  Laterality: N/A;  . CATARACT EXTRACTION W/PHACO Left 07/28/2016   Procedure: CATARACT  EXTRACTION PHACO AND INTRAOCULAR LENS PLACEMENT LEFT EYE:  CDE: 13.00;  Surgeon: Tonny Branch, MD;  Location: [facility] ORS;  Service: Ophthalmology;  Laterality: Left;  . ENDOSCOPIC RETROGRADE CHOLANGIOPANCREATOGRAPHY (ERCP) WITH PROPOFOL N/A 10/24/2016   Procedure: ENDOSCOPIC RETROGRADE CHOLANGIOPANCREATOGRAPHY (ERCP) WITH PROPOFOL;  Surgeon: Doran Stabler, MD;  Location: [place] ENDOSCOPY;  Service: Endoscopy;  Laterality: N/A;  . ESOPHAGOGASTRODUODENOSCOPY  12/30/2011   Procedure: ESOPHAGOGASTRODUODENOSCOPY (EGD);  Surgeon: Lelon Perla, MD;  Location: [facility] ENDOSCOPY;  Service: Cardiovascular;  Laterality: N/A;  . ESOPHAGOGASTRODUODENOSCOPY  12/30/2011   Procedure: ESOPHAGOGASTRODUODENOSCOPY (EGD);  Surgeon: Lafayette Dragon, MD;  Location: [facility] ENDOSCOPY;  Service: Endoscopy;  Laterality: N/A;  . IR GENERIC HISTORICAL  10/25/2016   IR INT EXT BILIARY DRAIN WITH CHOLANGIOGRAM 10/25/2016 Greggory Keen, MD [facility]-INTERV RAD  . IR GENERIC HISTORICAL  10/28/2016   IR EXCHANGE BILIARY DRAIN 10/28/2016 Greggory Keen, MD [facility]INTER[facility]  . IR GENERIC HISTORICAL  10/28/2016   IR ENDOLUMINAL BX OF BILIARY TREE 10/28/2016 Greggory Keen, MD [facility]INTER[facility]  . IR GENERIC HISTORICAL  11/21/2016   IR EXCHANGE BILIARY DRAIN 11/21/2016 Corrie Mckusick, DO [facility]-INTERV RAD  . KNEE ARTHROSCOPY  rt knee  . PROSTATE SURGERY    . TEE WITHOUT CARDIOVERSION  12/30/2011   Procedure: TRANSESOPHAGEAL ECHOCARDIOGRAM (TEE);  Surgeon: Lelon Perla, MD;  Location: [facility] ENDOSCOPY;  Service: Cardiovascular;  Laterality: N/A;  . TEE WITHOUT CARDIOVERSION  02/16/2012   Procedure: TRANSESOPHAGEAL  ECHOCARDIOGRAM (TEE);  Surgeon: Lelon Perla, MD;  Location: [facility] ENDOSCOPY;  Service: Cardiovascular;  Laterality: N/A;    SOCIAL HISTORY: Social History   Social History  . Marital status: Married    Spouse name: N/A  . Number of children: 2  . Years of education: N/A   Occupational History  . retired Retired   Social History Main Topics  . Smoking  status: Never Smoker  . Smokeless tobacco: Never Used  . Alcohol use No  . Drug use: No  . Sexual activity: Not Currently    Birth control/ protection: None   Other Topics Concern  . Not on file   Social History Narrative  . No narrative on file    FAMILY HISTORY: Family History  Problem Relation Age of Onset  . Heart disease Mother   . Heart disease Father   . Stomach cancer Brother   . Heart disease Sister   . Heart disease Brother   . Hypertension Sister     ALLERGIES:  is allergic to codeine; simvastatin; and vicodin [hydrocodone-acetaminophen].  MEDICATIONS:  Current Outpatient Prescriptions  Medication Sig Dispense Refill  . albuterol (PROVENTIL HFA;VENTOLIN HFA) 108 (90 BASE) MCG/ACT inhaler Inhale 2 puffs into the lungs every 6 (six) hours as needed for wheezing or shortness of breath.    . clorazepate (TRANXENE) 3.75 MG tablet Give .05 tablet by mouth daily as needed for anxiety    . fluticasone (FLONASE) 50 MCG/ACT nasal spray Place 1 spray into both nostrils daily as needed for allergies or rhinitis.    . folic acid (FOLVITE) 751 MCG tablet Take 800 mcg by mouth daily.    . furosemide (LASIX) 40 MG tablet TAKE ONE TABLET BY MOUTH ONCE DAILY 90 tablet 2  . nitroGLYCERIN (NITROSTAT) 0.4 MG SL tablet Place 1 tablet (0.4 mg total) under the tongue every 5 (five) minutes x 3 doses as needed for chest pain. 25 tablet 6  . omeprazole (PRILOSEC) 20 MG capsule Take 20 mg by mouth daily before breakfast.     . polyethylene glycol (MIRALAX / GLYCOLAX) packet Take 17 g by mouth at bedtime as needed.      No current facility-administered medications for this visit.     REVIEW OF SYSTEMS:    10 Point review of Systems was done is negative except as noted above.  PHYSICAL EXAMINATION: ECOG PERFORMANCE STATUS: 2 - Symptomatic, <50% confined to bed  . Vitals:   11/10/16 1225  BP: 100/68  Pulse: 100  Resp: 18  Temp: 97.8 F (36.6 C)   Filed Weights   11/10/16 1225    Weight: 182 lb 12.8 oz (82.9 kg)   .Body mass index is 26.23 kg/m.  GENERAL:alert, in no acute distress and comfortable SKIN: no acute rashes, no significant lesions EYES: conjunctiva are pink and non-injected, sclera anicteric OROPHARYNX: MMM, no exudates, no oropharyngeal erythema or ulceration NECK: supple, no JVD LYMPH:  no palpable lymphadenopathy in the cervical, axillary or inguinal regions LUNGS: clear to auscultation b/l with normal respiratory effort HEART: regular rate & rhythm ABDOMEN:  normoactive bowel sounds , non tender, not distended. Extremity: no pedal edema PSYCH: alert & oriented x 3 with fluent speech NEURO: no focal motor/sensory deficits  LABORATORY DATA:  I have reviewed the data as listed  Component     Latest Ref Rng & Units 11/10/2016  WBC     4.0 - 10.3 10e3/uL 7.1  NEUT#     1.5 - 6.5 10e3/uL 4.2  Hemoglobin     13.0 - 17.1 g/dL 69.6 (L)  HCT     29.5 - 49.9 % 37.6 (L)  Platelets     140 - 400 10e3/uL 167  MCV     79.3 - 98.0 fL 97.4  MCH     27.2 - 33.4 pg 32.6  MCHC     32.0 - 36.0 g/dL 28.4  RBC     1.32 - 4.40 10e6/uL 3.86 (L)  RDW     11.0 - 14.6 % 15.8 (H)  lymph#     0.9 - 3.3 10e3/uL 1.8  MONO#     0.1 - 0.9 10e3/uL 1.0 (H)  Eosinophils Absolute     0.0 - 0.5 10e3/uL 0.1  Basophils Absolute     0.0 - 0.1 10e3/uL 0.0  NEUT%     39.0 - 75.0 % 59.9  LYMPH%     14.0 - 49.0 % 25.2  MONO%     0.0 - 14.0 % 13.5  EOS%     0.0 - 7.0 % 1.0  BASO%     0.0 - 2.0 % 0.4  Retic %     0.80 - 1.80 % 2.89 (H)  Retic Ct Abs     34.80 - 93.90 10e3/uL 111.55 (H)  Immature Retic Fract     3.00 - 10.60 % 3.00  Sodium     136 - 145 mEq/L 137  Potassium     3.5 - 5.1 mEq/L 4.5  Chloride     98 - 109 mEq/L 106  CO2     22 - 29 mEq/L 20 (L)  Glucose     70 - 140 mg/dl 102  BUN     7.0 - 72.5 mg/dL 36.6 (H)  Creatinine     0.7 - 1.3 mg/dL 1.6 (H)  Total Bilirubin     0.20 - 1.20 mg/dL 4.40 (HH)  Alkaline Phosphatase     40  - 150 U/L 282 (H)  AST     5 - 34 U/L 87 (H)  ALT     0 - 55 U/L 122 (H)  Total Protein     6.4 - 8.3 g/dL 6.7  Albumin     3.5 - 5.0 g/dL 3.1 (L)  Calcium     8.4 - 10.4 mg/dL 9.1  Anion gap     3 - 11 mEq/L 11  EGFR     >90 ml/min/1.73 m2 40 (L)   CT chest wo contrast 10/31/2016 IMPRESSION: 1. Small 7 mm subcutaneous nodule in the anterior chest wall, nonspecific, but new from chest CT January 2017. This may be a subcutaneous lymph node or metastatic deposit, versus incidental soft tissue nodule. 2. There is otherwise no evidence of metastatic disease in the thorax. 3. Stable aneurysmal dilatation of the aortic root and ascending aorta. 4. Unchanged 3 mm left lower lobe pulmonary nodule dating back to 2013, benign. 5. Stable CT appearance of left thyroid nodule.   Electronically Signed   By: Rubye Oaks M.D.   On: 10/31/2016 19:41   CT abd and pelvis with contrast 10/22/2016 IMPRESSION: 1. Moderate intrahepatic biliary ductal dilatation, mild distention of the gallbladder, a distention of the common bile duct up to 19 mm. The common bile duct is abruptly cut off as it enters the head of the pancreas with soft tissue filling the lumen. 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 MRI/MRCP is recommended with and without intravenous contrast for  further evaluation. 2. Moderate cardiomegaly. 3. Cholelithiasis. 4. Aortic atherosclerosis. 5. Prostate hypertrophy.   Electronically Signed   By: Kristine Garbe M.D.   On: 10/22/2016 22:29   RADIOGRAPHIC STUDIES: I have personally reviewed the radiological images as listed and agreed with the findings in the report. ASSESSMENT & PLAN:   83 year old male with   #1 Newly Diagnosed Extrahepatic Cholangiocarcinoma causing biliary obstruction and possible cholangitis. Patient is status post internal/external drain placement. Cholangiocarcinoma more likely since the  obstruction was in the mid CBD and there was no overt pancreatic lesion noted. Elevated CA-19-9 levels can occur in both cholangiocarcinoma and pancreatic cancers #2 abnormal liver function tests likely related to mid CBD malignant obstruction from newly diagnosed cholangiocarcinoma.  Status post antibiotics for treatment of possible cholangitis. LFTs are gradually improving. CT scan of the abdomen and pelvis and CT chest did not show evidence of overt metastatic disease.  Component     Latest Ref Rng & Units 10/23/2016 10/30/2016  CA 19-9     0 - 35 U/mL 1,581 (H)   CEA     0.0 - 4.7 ng/mL  3.8    Plan -Spent significant. Of time discussing goals of care with the patient and his family and discussed the current status of his disease. -After extensive discussion with the patient is inclined to have a discussion about surgical options. We'll give her a referral to Dr. Barry Dienes to get her opinion regarding possibility of surgical resection. -We shall see the patient back after his surgical evaluation to discuss further treatment options accordingly   #3 h/o Waldenstrom's macroglobulinemia/LPL -- s/p treatment at [facility] in [place] Dr Elyn Aquas in 2016  Component     Latest Ref Rng & Units 10/30/2016  IgG (Immunoglobin G), Serum     700 - 1,600 mg/dL 437 (L)  IgA     61 - 437 mg/dL 50 (L)  IgM, Serum     15 - 143 mg/dL 741 (H)  Total Protein ELP     6.0 - 8.5 g/dL 5.7 (L)  Albumin SerPl Elph-Mcnc     2.9 - 4.4 g/dL 2.7 (L)  Alpha 1     0.0 - 0.4 g/dL 0.4  Alpha2 Glob SerPl Elph-Mcnc     0.4 - 1.0 g/dL 0.8  B-Globulin SerPl Elph-Mcnc     0.7 - 1.3 g/dL 0.9  Gamma Glob SerPl Elph-Mcnc     0.4 - 1.8 g/dL 0.8  M Protein SerPl Elph-Mcnc     Not Observed g/dL 0.4 (H)  Globulin, Total     2.2 - 3.9 g/dL 3.0  Albumin/Glob SerPl     0.7 - 1.7 1.0  IFE 1      Comment  Please Note (HCV):      Comment  Kappa free light chain     3.3 - 19.4 mg/L 10.6  Lamda free light chains     5.7 -  26.3 mg/L 17.1  Kappa, lamda light chain ratio     0.26 - 1.65 0.62  LDH     98 - 192 U/L 163   Plan No indication for additional treatment of the patients WM at this time.  #4.. Patient Active Problem List   Diagnosis Date Noted  . Cholangitis   . Cholangiocarcinoma ([place])   . Obstructive jaundice   . Hyperbilirubinemia   . Common bile duct stricture   . Jaundice 10/22/2016  . Supratherapeutic INR 10/22/2016  . GERD (gastroesophageal reflux disease) 10/22/2016  . Anxiety 10/22/2016  .  Cough 10/09/2016  . Thyroid goiter 02/26/2012  . Aortic root enlargement ([place]) 01/12/2012  . Long term (current) use of anticoagulants 01/06/2012  . Thrombus of left atrial appendage 01/06/2012  . Acute Systolic dysfunction/cardiomyopathy due to prolonged tachycardia/EF 25% 12/30/2011  . Thrombocytopenia ([place]) 12/30/2011  . Other dysphagia 12/30/2011  . Chronic systolic heart failure ([place]) 12/28/2011  . Atrial fibrillation 12/28/2011  . HTN (hypertension), benign 12/28/2011    - will need to continue f/u with PCP and cardiologist on discharge.  Referral to Dr Stark Klein for cholangiocarcinoma RTC with Dr Irene Limbo in about 2-3 weeks (after he is done with surgical consultation) with labs   All of the patients questions were answered with apparent satisfaction. The patient knows to call the clinic with any problems, questions or concerns.  I spent 30 minutes counseling the patient face to face. The total time spent in the appointment was 40 minutes and more than 50% was on counseling and direct patient cares.    Sullivan Lone MD [place] AAHIVMS [facility] Hematology/Oncology Physician [facility]  (Office):       [phone] (Work cell):  [phone] (Fax):           [phone]  .11/10/2016

## 2016-12-14 NOTE — Progress Notes (Signed)
Jimmy Myers    HEMATOLOGY/ONCOLOGY CLINIC NOTE  Date of Service: .11/24/2016  Patient Care Team: [name], MD as PCP - General (Family Medicine) Thompson Grayer, MD as Attending Physician (Cardiology) Donita Brooks, MD as Consulting Physician (Internal Medicine) [name], MD as Attending Physician (Family Medicine)  CHIEF COMPLAINTS/PURPOSE OF CONSULTATION:  Newly diagnosed cholangiocarcinoma  HISTORY OF PRESENTING ILLNESS:   Plz see previous note for details on initial presentation  INTERVAL HISTORY  Patient is here for a follow-up for his cholangiocarcinoma after having had a surgical evaluation by Dr. Barry Dienes. He had a detailed discussion of what is involving the surgery and has made a decision not to consider proceeding with surgery. His LFTs are improving with biliary drainage. No further fevers. We discussed the treatment options would involve palliative chemotherapy alone, radiation alone or combination of chemotherapy and radiation. He is along with his family agreeable to have a discussion with radiation oncology and was given a referral to radiation oncology to help make the decision.  MEDICAL HISTORY:  Past Medical History:  Diagnosis Date  . A-fib [facility]) May 2013   s/p TEE/cardioversion June 2013  . Anxiety   . Aortic root enlargement (HCC)    at least 5cm by CT  . Arthritis   . Atrial thrombus   . Bradycardia   . Bronchitis   . Cancer ([place]) 2016   lymphoma  . Chronic anticoagulation    on coumadin - checked in [place]  . Congestive heart failure (CHF) ([place]) 2013  . Diverticulosis   . Dysrhythmia    AFib  . GERD (gastroesophageal reflux disease)   . Hemorrhoids   . HTN (hypertension)   . Pneumonia May 2013  . Pneumonia 2013  . 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 Thrombocytopenia ([place])     SURGICAL  HISTORY: Past Surgical History:  Procedure Laterality Date  . CARDIOVERSION  12/30/2011   Procedure: CARDIOVERSION;  Surgeon: Lelon Perla, MD;  Location: [facility] ENDOSCOPY;  Service: Cardiovascular;  Laterality: N/A;  . CARDIOVERSION  02/16/2012   Procedure: CARDIOVERSION;  Surgeon: Lelon Perla, MD;  Location: [facility] ENDOSCOPY;  Service: Cardiovascular;  Laterality: N/A;  . CATARACT EXTRACTION W/PHACO Left 07/28/2016   Procedure: CATARACT EXTRACTION PHACO AND INTRAOCULAR LENS PLACEMENT LEFT EYE:  CDE: 13.00;  Surgeon: Tonny Branch, MD;  Location: [facility] ORS;  Service: Ophthalmology;  Laterality: Left;  . ENDOSCOPIC RETROGRADE CHOLANGIOPANCREATOGRAPHY (ERCP) WITH PROPOFOL N/A 10/24/2016   Procedure: ENDOSCOPIC RETROGRADE CHOLANGIOPANCREATOGRAPHY (ERCP) WITH PROPOFOL;  Surgeon: Doran Stabler, MD;  Location: [place] ENDOSCOPY;  Service: Endoscopy;  Laterality: N/A;  . ESOPHAGOGASTRODUODENOSCOPY  12/30/2011   Procedure: ESOPHAGOGASTRODUODENOSCOPY (EGD);  Surgeon: Lelon Perla, MD;  Location: [facility] ENDOSCOPY;  Service: Cardiovascular;  Laterality: N/A;  . ESOPHAGOGASTRODUODENOSCOPY  12/30/2011   Procedure: ESOPHAGOGASTRODUODENOSCOPY (EGD);  Surgeon: Lafayette Dragon, MD;  Location: [facility] ENDOSCOPY;  Service: Endoscopy;  Laterality: N/A;  . IR GENERIC HISTORICAL  10/25/2016   IR INT EXT BILIARY DRAIN WITH CHOLANGIOGRAM 10/25/2016 Greggory Keen, MD [facility]-INTERV RAD  . IR GENERIC HISTORICAL  10/28/2016   IR EXCHANGE BILIARY DRAIN 10/28/2016 Greggory Keen, MD [facility]INTER[facility]  . IR GENERIC HISTORICAL  10/28/2016   IR ENDOLUMINAL BX OF BILIARY TREE 10/28/2016 Greggory Keen, MD [facility]INTER[facility]  . IR GENERIC HISTORICAL  11/21/2016   IR EXCHANGE BILIARY DRAIN 11/21/2016 York Cerise  Wagner, DO [facility]-INTERV RAD  . KNEE ARTHROSCOPY  rt knee  . PROSTATE SURGERY    . TEE WITHOUT CARDIOVERSION  12/30/2011   Procedure: TRANSESOPHAGEAL ECHOCARDIOGRAM (TEE);  Surgeon: Lelon Perla, MD;  Location: [facility] ENDOSCOPY;  Service: Cardiovascular;  Laterality:  N/A;  . TEE WITHOUT CARDIOVERSION  02/16/2012   Procedure: TRANSESOPHAGEAL ECHOCARDIOGRAM (TEE);  Surgeon: Lelon Perla, MD;  Location: [facility] ENDOSCOPY;  Service: Cardiovascular;  Laterality: N/A;    SOCIAL HISTORY: Social History   Social History  . Marital status: Married    Spouse name: N/A  . Number of children: 2  . Years of education: N/A   Occupational History  . retired Retired   Social History Main Topics  . Smoking status: Never Smoker  . Smokeless tobacco: Never Used  . Alcohol use No  . Drug use: No  . Sexual activity: Not Currently    Birth control/ protection: None   Other Topics Concern  . Not on file   Social History Narrative  . No narrative on file    FAMILY HISTORY: Family History  Problem Relation Age of Onset  . Heart disease Mother   . Heart disease Father   . Stomach cancer Brother   . Heart disease Sister   . Heart disease Brother   . Hypertension Sister     ALLERGIES:  is allergic to codeine; simvastatin; and vicodin [hydrocodone-acetaminophen].  MEDICATIONS:  Current Outpatient Prescriptions  Medication Sig Dispense Refill  . albuterol (PROVENTIL HFA;VENTOLIN HFA) 108 (90 BASE) MCG/ACT inhaler Inhale 2 puffs into the lungs every 6 (six) hours as needed for wheezing or shortness of breath.    . clorazepate (TRANXENE) 3.75 MG tablet Give .05 tablet by mouth daily as needed for anxiety    . fluticasone (FLONASE) 50 MCG/ACT nasal spray Place 1 spray into both nostrils daily as needed for allergies or rhinitis.    . folic acid (FOLVITE) 767 MCG tablet Take 800 mcg by mouth daily.    . furosemide (LASIX) 40 MG tablet TAKE ONE TABLET BY MOUTH ONCE DAILY 90 tablet 2  . nitroGLYCERIN (NITROSTAT) 0.4 MG SL tablet Place 1 tablet (0.4 mg total) under the tongue every 5 (five) minutes x 3 doses as needed for chest pain. 25 tablet 6  . omeprazole (PRILOSEC) 20 MG capsule Take 20 mg by mouth daily before breakfast.     . polyethylene glycol (MIRALAX /  GLYCOLAX) packet Take 17 g by mouth at bedtime as needed.      No current facility-administered medications for this visit.     REVIEW OF SYSTEMS:    10 Point review of Systems was done is negative except as noted above.  PHYSICAL EXAMINATION: ECOG PERFORMANCE STATUS: 2 - Symptomatic, <50% confined to bed  . Vitals:   11/24/16 1206  BP: 118/82  Pulse: 99  Resp: 18  Temp: 97.9 F (36.6 C)   Filed Weights   11/24/16 1206  Weight: 187 lb 8 oz (85 kg)   .Body mass index is 26.9 kg/m.  GENERAL:alert, in no acute distress and comfortable SKIN: no acute rashes, no significant lesions EYES: conjunctiva are pink and non-injected, sclera anicteric OROPHARYNX: MMM, no exudates, no oropharyngeal erythema or ulceration NECK: supple, no JVD LYMPH:  no palpable lymphadenopathy in the cervical, axillary or inguinal regions LUNGS: clear to auscultation b/l with normal respiratory effort HEART: regular rate & rhythm ABDOMEN:  normoactive bowel sounds , non tender, not distended. Extremity: no pedal edema PSYCH: alert & oriented  x 3 with fluent speech NEURO: no focal motor/sensory deficits  LABORATORY DATA:  I have reviewed the data as listed  Component     Latest Ref Rng & Units 10/23/2016 11/10/2016 11/24/2016  WBC     4.0 - 10.3 10e3/uL  7.1 5.9  NEUT#     1.5 - 6.5 10e3/uL  4.2 3.6  Hemoglobin     13.0 - 17.1 g/dL  12.6 (L) 11.9 (L)  HCT     38.4 - 49.9 %  37.6 (L) 36.6 (L)  Platelets     140 - 400 10e3/uL  167 100 (L)  MCV     79.3 - 98.0 fL  97.4 98.4 (H)  MCH     27.2 - 33.4 pg  32.6 32.0  MCHC     32.0 - 36.0 g/dL  33.5 32.5  RBC     4.20 - 5.82 10e6/uL  3.86 (L) 3.72 (L)  RDW     11.0 - 14.6 %  15.8 (H) 15.5 (H)  lymph#     0.9 - 3.3 10e3/uL  1.8 1.3  MONO#     0.1 - 0.9 10e3/uL  1.0 (H) 0.9  Eosinophils Absolute     0.0 - 0.5 10e3/uL  0.1 0.1  Basophils Absolute     0.0 - 0.1 10e3/uL  0.0 0.0  NEUT%     39.0 - 75.0 %  59.9 61.4  LYMPH%     14.0 - 49.0  %  25.2 21.7  MONO%     0.0 - 14.0 %  13.5 15.3 (H)  EOS%     0.0 - 7.0 %  1.0 1.4  BASO%     0.0 - 2.0 %  0.4 0.2  Retic %     0.80 - 1.80 %  2.89 (H) 1.98 (H)  Retic Ct Abs     34.80 - 93.90 10e3/uL  111.55 (H) 73.66  Immature Retic Fract     3.00 - 10.60 %  3.00 2.00 (L)  Sodium     136 - 145 mEq/L  137 137  Potassium     3.5 - 5.1 mEq/L  4.5 5.3 no hemolysis noted (H)  Chloride     98 - 109 mEq/L  106 104  CO2     22 - 29 mEq/L  20 (L) 25  Glucose     70 - 140 mg/dl  106 136  BUN     7.0 - 26.0 mg/dL  48.8 (H) 24.9  Creatinine     0.7 - 1.3 mg/dL  1.6 (H) 1.4 (H)  Total Bilirubin     0.20 - 1.20 mg/dL  8.05 (HH) 3.26 (H)  Alkaline Phosphatase     40 - 150 U/L  282 (H) 288 (H)  AST     5 - 34 U/L  87 (H) 58 (H)  ALT     0 - 55 U/L  122 (H) 92 (H)  Total Protein     6.4 - 8.3 g/dL  6.7 6.0 (L)  Albumin     3.5 - 5.0 g/dL  3.1 (L) 2.8 (L)  Calcium     8.4 - 10.4 mg/dL  9.1 9.0  Anion gap     3 - 11 mEq/L  11 9  EGFR     >90 ml/min/1.73 m2  40 (L) 46 (L)  CA 19-9     0 - 35 U/mL 1,581 (H)    CA 19-9     0 -  35 U/mL   295 (H)    CT chest wo contrast 10/31/2016 IMPRESSION: 1. Small 7 mm subcutaneous nodule in the anterior chest wall, nonspecific, but new from chest CT January 2017. This may be a subcutaneous lymph node or metastatic deposit, versus incidental soft tissue nodule. 2. There is otherwise no evidence of metastatic disease in the thorax. 3. Stable aneurysmal dilatation of the aortic root and ascending aorta. 4. Unchanged 3 mm left lower lobe pulmonary nodule dating back to 2013, benign. 5. Stable CT appearance of left thyroid nodule.   Electronically Signed   By: Jeb Levering M.D.   On: 10/31/2016 19:41   CT abd and pelvis with contrast 10/22/2016 IMPRESSION: 1. Moderate intrahepatic biliary ductal dilatation, mild distention of the gallbladder, a distention of the common bile duct up to 19 mm. The common bile duct is abruptly cut  off as it enters the head of the pancreas with soft tissue filling the lumen. 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 MRI/MRCP is recommended with and without intravenous contrast for further evaluation. 2. Moderate cardiomegaly. 3. Cholelithiasis. 4. Aortic atherosclerosis. 5. Prostate hypertrophy.   Electronically Signed   By: Kristine Garbe M.D.   On: 10/22/2016 22:29   RADIOGRAPHIC STUDIES: I have personally reviewed the radiological images as listed and agreed with the findings in the report. ASSESSMENT & PLAN:   83 year old male with   #1 Newly Diagnosed Extrahepatic Cholangiocarcinoma causing biliary obstruction and possible cholangitis. Patient is status post internal/external drain placement. Cholangiocarcinoma more likely since the obstruction was in the mid CBD and there was no overt pancreatic lesion noted. Elevated CA-19-9 levels can occur in both cholangiocarcinoma and pancreatic cancers #2 abnormal liver function tests likely related to mid CBD malignant obstruction from newly diagnosed cholangiocarcinoma.  Status post antibiotics for treatment of possible cholangitis. LFTs are gradually improving. CT scan of the abdomen and pelvis and CT chest did not show evidence of overt metastatic disease.  CA 19-9 levels seem to have improved with improvement in biliary obstruction. Plan -Patient has been evaluated by Dr. Barry Dienes from surgery and has made a decision not to consider surgery which would be prohibitively   given the patient's age and overall medical condition. -We discussed the incision guidelines and options for radiation alone versus concurrent chemotherapy plus radiation versus chemotherapy alone. -Given patient's age she is not the best candidate for chemotherapy in the setting of recent cholangitis and abnormal liver function tests as well as acute on chronic kidney disease. -Would need to consider  radiation alone vs concurrent 5-FU/leucovorin + RT - Referral has been given to radiation oncology to weigh in on treatment   #3 h/o Waldenstrom's macroglobulinemia/LPL -- s/p treatment at [facility] in [place] Dr Elyn Aquas in 2016  Component     Latest Ref Rng & Units 10/30/2016  IgG (Immunoglobin G), Serum     700 - 1,600 mg/dL 437 (L)  IgA     61 - 437 mg/dL 50 (L)  IgM, Serum     15 - 143 mg/dL 741 (H)  Total Protein ELP     6.0 - 8.5 g/dL 5.7 (L)  Albumin SerPl Elph-Mcnc     2.9 - 4.4 g/dL 2.7 (L)  Alpha 1     0.0 - 0.4 g/dL 0.4  Alpha2 Glob SerPl Elph-Mcnc     0.4 - 1.0 g/dL 0.8  B-Globulin SerPl Elph-Mcnc     0.7 - 1.3 g/dL 0.9  Gamma Glob SerPl  Elph-Mcnc     0.4 - 1.8 g/dL 0.8  M Protein SerPl Elph-Mcnc     Not Observed g/dL 0.4 (H)  Globulin, Total     2.2 - 3.9 g/dL 3.0  Albumin/Glob SerPl     0.7 - 1.7 1.0  IFE 1      Comment  Please Note (HCV):      Comment  Kappa free light chain     3.3 - 19.4 mg/L 10.6  Lamda free light chains     5.7 - 26.3 mg/L 17.1  Kappa, lamda light chain ratio     0.26 - 1.65 0.62  LDH     98 - 192 U/L 163   Plan No indication for additional treatment of the patients WM at this time. -rpt myeloma panel and SFLC in 6 months  #4.Jimmy Myers Patient Active Problem List   Diagnosis Date Noted  . Cholangitis   . Cholangiocarcinoma ([place])   . Obstructive jaundice   . Hyperbilirubinemia   . Common bile duct stricture   . Jaundice 10/22/2016  . Supratherapeutic INR 10/22/2016  . GERD (gastroesophageal reflux disease) 10/22/2016  . Anxiety 10/22/2016  . Cough 10/09/2016  . Thyroid goiter 02/26/2012  . Aortic root enlargement ([place]) 01/12/2012  . Long term (current) use of anticoagulants 01/06/2012  . Thrombus of left atrial appendage 01/06/2012  . Acute Systolic dysfunction/cardiomyopathy due to prolonged tachycardia/EF 25% 12/30/2011  . Thrombocytopenia ([place]) 12/30/2011  . Other dysphagia 12/30/2011  . Chronic systolic heart failure ([place])  12/28/2011  . Atrial fibrillation 12/28/2011  . HTN (hypertension), benign 12/28/2011    - will need to continue f/u with PCP and cardiologist on discharge.  Radiation oncology referral for extrahepatic cholangiocarcinoma RTC with Dr Irene Limbo in 2 weeks with labs  All of the patients questions were answered with apparent satisfaction. The patient knows to call the clinic with any problems, questions or concerns.  I spent 20 minutes counseling the patient face to face. The total time spent in the appointment was 25 minutes and more than 50% was on counseling and direct patient cares.    Sullivan Lone MD [place] AAHIVMS [facility] Hematology/Oncology [facility]  (Office):       [phone] (Work cell):  [phone] (Fax):           [phone]  .11/24/2016

## 2016-12-14 NOTE — Progress Notes (Signed)
Jimmy Myers    HEMATOLOGY/ONCOLOGY CLINIC NOTE  Date of Service: .12/11/2016  Patient Care Team: [name], MD as PCP - General (Family Medicine) Thompson Grayer, MD as Attending Physician (Cardiology) Donita Brooks, MD as Consulting Physician (Internal Medicine) [name], MD as Attending Physician (Family Medicine)  CHIEF COMPLAINTS/PURPOSE OF CONSULTATION:  Newly diagnosed cholangiocarcinoma  HISTORY OF PRESENTING ILLNESS:   Plz see previous note for details on initial presentation  INTERVAL HISTORY  Patient is here for a follow-up for his cholangiocarcinoma after having had a radiation oncology evaluation by Dr. Lisbeth Renshaw. He notes no acute new issues. His biliary stent has been internalized and is being managed by interventional radiology. Dr. Lisbeth Renshaw offered him options for SBRT versus chemoradiation with a plan to proceed with SBRT which I would agree with. Patient has no abdominal discomfort and notes that he is eating better as well. No other acute new symptoms.  MEDICAL HISTORY:  Past Medical History:  Diagnosis Date  . A-fib [facility]) May 2013   s/p TEE/cardioversion June 2013  . Anxiety   . Aortic root enlargement (HCC)    at least 5cm by CT  . Arthritis   . Atrial thrombus   . Bradycardia   . Bronchitis   . Cancer ([place]) 2016   lymphoma  . Chronic anticoagulation    on coumadin - checked in [place]  . Congestive heart failure (CHF) ([place]) 2013  . Diverticulosis   . Dysrhythmia    AFib  . GERD (gastroesophageal reflux disease)   . Hemorrhoids   . HTN (hypertension)   . Pneumonia May 2013  . Pneumonia 2013  . 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 Thrombocytopenia ([place])     SURGICAL HISTORY: Past Surgical History:  Procedure Laterality Date  . CARDIOVERSION  12/30/2011   Procedure: CARDIOVERSION;  Surgeon: Lelon Perla, MD;  Location: [facility] ENDOSCOPY;  Service: Cardiovascular;  Laterality: N/A;  . CARDIOVERSION  02/16/2012   Procedure: CARDIOVERSION;  Surgeon: Lelon Perla, MD;  Location: [facility] ENDOSCOPY;  Service: Cardiovascular;  Laterality: N/A;  . CATARACT EXTRACTION W/PHACO Left 07/28/2016   Procedure: CATARACT EXTRACTION PHACO AND INTRAOCULAR LENS PLACEMENT LEFT EYE:  CDE: 13.00;  Surgeon: Tonny Branch, MD;  Location: [facility] ORS;  Service: Ophthalmology;  Laterality: Left;  . ENDOSCOPIC RETROGRADE CHOLANGIOPANCREATOGRAPHY (ERCP) WITH PROPOFOL N/A 10/24/2016   Procedure: ENDOSCOPIC RETROGRADE CHOLANGIOPANCREATOGRAPHY (ERCP) WITH PROPOFOL;  Surgeon: Doran Stabler, MD;  Location: [facility] ENDOSCOPY;  Service: Endoscopy;  Laterality: N/A;  . ESOPHAGOGASTRODUODENOSCOPY  12/30/2011   Procedure: ESOPHAGOGASTRODUODENOSCOPY (EGD);  Surgeon: Lelon Perla, MD;  Location: [facility] ENDOSCOPY;  Service: Cardiovascular;  Laterality: N/A;  . ESOPHAGOGASTRODUODENOSCOPY  12/30/2011   Procedure: ESOPHAGOGASTRODUODENOSCOPY (EGD);  Surgeon: Lafayette Dragon, MD;  Location: [facility] ENDOSCOPY;  Service: Endoscopy;  Laterality: N/A;  . IR GENERIC HISTORICAL  10/25/2016   IR INT EXT BILIARY DRAIN WITH CHOLANGIOGRAM 10/25/2016 Greggory Keen, MD [facility]-INTERV RAD  . IR GENERIC HISTORICAL  10/28/2016   IR EXCHANGE BILIARY DRAIN 10/28/2016 Greggory Keen, MD [facility]  . IR GENERIC HISTORICAL  10/28/2016   IR ENDOLUMINAL BX OF BILIARY TREE 10/28/2016 Greggory Keen, MD [facility]INTER[facility]  . IR GENERIC HISTORICAL  11/21/2016   IR EXCHANGE BILIARY DRAIN 11/21/2016 Corrie Mckusick, DO [facility]-INTERV RAD  . KNEE ARTHROSCOPY  rt knee  . PROSTATE SURGERY    .  TEE WITHOUT CARDIOVERSION  12/30/2011   Procedure: TRANSESOPHAGEAL ECHOCARDIOGRAM (TEE);  Surgeon: Lelon Perla, MD;  Location: [facility] ENDOSCOPY;  Service: Cardiovascular;  Laterality: N/A;  . TEE WITHOUT CARDIOVERSION  02/16/2012   Procedure: TRANSESOPHAGEAL ECHOCARDIOGRAM (TEE);  Surgeon: Lelon Perla, MD;   Location: [facility] ENDOSCOPY;  Service: Cardiovascular;  Laterality: N/A;    SOCIAL HISTORY: Social History   Social History  . Marital status: Married    Spouse name: N/A  . Number of children: 2  . Years of education: N/A   Occupational History  . retired Retired   Social History Main Topics  . Smoking status: Never Smoker  . Smokeless tobacco: Never Used  . Alcohol use No  . Drug use: No  . Sexual activity: Not Currently    Birth control/ protection: None   Other Topics Concern  . Not on file   Social History Narrative  . No narrative on file    FAMILY HISTORY: Family History  Problem Relation Age of Onset  . Heart disease Mother   . Heart disease Father   . Stomach cancer Brother   . Heart disease Sister   . Heart disease Brother   . Hypertension Sister     ALLERGIES:  is allergic to codeine; simvastatin; and vicodin [hydrocodone-acetaminophen].  MEDICATIONS:  Current Outpatient Prescriptions  Medication Sig Dispense Refill  . albuterol (PROVENTIL HFA;VENTOLIN HFA) 108 (90 BASE) MCG/ACT inhaler Inhale 2 puffs into the lungs every 6 (six) hours as needed for wheezing or shortness of breath.    . clorazepate (TRANXENE) 3.75 MG tablet Give .05 tablet by mouth daily as needed for anxiety    . fluticasone (FLONASE) 50 MCG/ACT nasal spray Place 1 spray into both nostrils daily as needed for allergies or rhinitis.    . folic acid (FOLVITE) 494 MCG tablet Take 800 mcg by mouth daily.    . furosemide (LASIX) 40 MG tablet TAKE ONE TABLET BY MOUTH ONCE DAILY 90 tablet 2  . nitroGLYCERIN (NITROSTAT) 0.4 MG SL tablet Place 1 tablet (0.4 mg total) under the tongue every 5 (five) minutes x 3 doses as needed for chest pain. 25 tablet 6  . omeprazole (PRILOSEC) 20 MG capsule Take 20 mg by mouth daily before breakfast.     . polyethylene glycol (MIRALAX / GLYCOLAX) packet Take 17 g by mouth at bedtime as needed.      No current facility-administered medications for this visit.      REVIEW OF SYSTEMS:    10 Point review of Systems was done is negative except as noted above.  PHYSICAL EXAMINATION: ECOG PERFORMANCE STATUS: 2 - Symptomatic, <50% confined to bed  . Vitals:   12/11/16 0844  BP: 133/68  Pulse: 100  Resp: 18  Temp: 98.3 F (36.8 C)   Filed Weights   12/11/16 0844  Weight: 203 lb 9.6 oz (92.4 kg)   .Body mass index is 29.21 kg/m.  GENERAL:alert, in no acute distress and comfortable SKIN: no acute rashes, no significant lesions EYES: conjunctiva are pink and non-injected, sclera anicteric OROPHARYNX: MMM, no exudates, no oropharyngeal erythema or ulceration NECK: supple, no JVD LYMPH:  no palpable lymphadenopathy in the cervical, axillary or inguinal regions LUNGS: clear to auscultation b/l with normal respiratory effort HEART: regular rate & rhythm ABDOMEN:  normoactive bowel sounds , non tender, not distended. Extremity: no pedal edema PSYCH: alert & oriented x 3 with fluent speech NEURO: no focal motor/sensory deficits  LABORATORY DATA:  I have reviewed the data  as listed  . CBC Latest Ref Rng & Units 12/11/2016 11/24/2016 11/10/2016  WBC 4.0 - 10.3 10e3/uL 5.3 5.9 7.1  Hemoglobin 13.0 - 17.1 g/dL 11.6(L) 11.9(L) 12.6(L)  Hematocrit 38.4 - 49.9 % 35.8(L) 36.6(L) 37.6(L)  Platelets 140 - 400 10e3/uL 122(L) 100(L) 167   . CMP Latest Ref Rng & Units 12/11/2016 12/08/2016 11/24/2016  Glucose 70 - 140 mg/dl 111 93 136  BUN 7.0 - 26.0 mg/dL 17.2 15 24.9  Creatinine 0.7 - 1.3 mg/dL 1.1 0.99 1.4(H)  Sodium 136 - 145 mEq/L 144 142 137  Potassium 3.5 - 5.1 mEq/L 3.8 5.7(H) 5.3 no hemolysis noted(H)  Chloride 96 - 106 mmol/L - 101 -  CO2 22 - 29 mEq/L 28 27 25   Calcium 8.4 - 10.4 mg/dL 8.8 8.8 9.0  Total Protein 6.4 - 8.3 g/dL 6.1(L) 5.9(L) 6.0(L)  Total Bilirubin 0.20 - 1.20 mg/dL 1.64(H) 1.5(H) 3.26(H)  Alkaline Phos 40 - 150 U/L 198(H) 194(H) 288(H)  AST 5 - 34 U/L 41(H) 38 58(H)  ALT 0 - 55 U/L 48 48(H) 92(H)     CT chest wo  contrast 10/31/2016 IMPRESSION: 1. Small 7 mm subcutaneous nodule in the anterior chest wall, nonspecific, but new from chest CT January 2017. This may be a subcutaneous lymph node or metastatic deposit, versus incidental soft tissue nodule. 2. There is otherwise no evidence of metastatic disease in the thorax. 3. Stable aneurysmal dilatation of the aortic root and ascending aorta. 4. Unchanged 3 mm left lower lobe pulmonary nodule dating back to 2013, benign. 5. Stable CT appearance of left thyroid nodule.   Electronically Signed   By: Jeb Levering M.D.   On: 10/31/2016 19:41   CT abd and pelvis with contrast 10/22/2016 IMPRESSION: 1. Moderate intrahepatic biliary ductal dilatation, mild distention of the gallbladder, a distention of the common bile duct up to 19 mm. The common bile duct is abruptly cut off as it enters the head of the pancreas with soft tissue filling the lumen. 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 MRI/MRCP is recommended with and without intravenous contrast for further evaluation. 2. Moderate cardiomegaly. 3. Cholelithiasis. 4. Aortic atherosclerosis. 5. Prostate hypertrophy.   Electronically Signed   By: Kristine Garbe M.D.   On: 10/22/2016 22:29   RADIOGRAPHIC STUDIES: I have personally reviewed the radiological images as listed and agreed with the findings in the report. ASSESSMENT & PLAN:   83 year old male with   #1 Newly Diagnosed Extrahepatic Cholangiocarcinoma causing biliary obstruction and possible cholangitis. Patient is status post internal/external drain placement. Cholangiocarcinoma more likely since the obstruction was in the mid CBD and there was no overt pancreatic lesion noted. Elevated CA-19-9 levels can occur in both cholangiocarcinoma and pancreatic cancers #2 abnormal liver function tests likely related to mid CBD malignant obstruction from newly diagnosed  cholangiocarcinoma.  Status post antibiotics for treatment of possible cholangitis. LFTs are gradually improving. CT scan of the abdomen and pelvis and CT chest did not show evidence of overt metastatic disease.  CA 19-9 levels seem to have improved with improvement in biliary obstruction. Plan -appreciate input from radiation oncology Dr Lisbeth Renshaw -agree with plan to proceed with stereotactic RT. -Given patient's age, recent cholangitis, abnormal liver function tests, normal renal function tests, underlying Waldenstrm's macroglobulinemia and CHF patient is a poor candidate for tolerating significant chemotherapy at this time or very aggressive treatments. -We shall see the patient back after completion of his planned radiation therapy in 5-6 weeks  with labs.   #3 h/o Waldenstrom's macroglobulinemia/LPL -- s/p treatment at [facility] in [place] Dr Elyn Aquas in 2016  Plan No indication for additional treatment of the patients WM at this time. -rpt myeloma panel and SFLC in 6 months  #4.Jimmy Myers Patient Active Problem List   Diagnosis Date Noted  . Cholangitis   . Cholangiocarcinoma ([place])   . Obstructive jaundice   . Hyperbilirubinemia   . Common bile duct stricture   . Jaundice 10/22/2016  . Supratherapeutic INR 10/22/2016  . GERD (gastroesophageal reflux disease) 10/22/2016  . Anxiety 10/22/2016  . Cough 10/09/2016  . Thyroid goiter 02/26/2012  . Aortic root enlargement ([place]) 01/12/2012  . Long term (current) use of anticoagulants 01/06/2012  . Thrombus of left atrial appendage 01/06/2012  . Acute Systolic dysfunction/cardiomyopathy due to prolonged tachycardia/EF 25% 12/30/2011  . Thrombocytopenia ([place]) 12/30/2011  . Other dysphagia 12/30/2011  . Chronic systolic heart failure ([place]) 12/28/2011  . Atrial fibrillation 12/28/2011  . HTN (hypertension), benign 12/28/2011    - will need to continue f/u with PCP and cardiologist on discharge.  RTC with Dr Irene Limbo in 5-6 weeks with  labs Continue f/u with radiation oncology for SBRT to cholan  All of the patients questions were answered with apparent satisfaction. The patient knows to call the clinic with any problems, questions or concerns.  I spent 20 minutes counseling the patient face to face. The total time spent in the appointment was 20 minutes and more than 50% was on counseling and direct patient cares.    Sullivan Lone MD [place] AAHIVMS [facility] Hematology/Oncology Physician [facility]  (Office):       [phone] (Work cell):  [phone] (Fax):           [phone]  .12/11/2016

## 2016-12-15 ENCOUNTER — Other Ambulatory Visit: Payer: Self-pay | Admitting: Student

## 2016-12-16 ENCOUNTER — Ambulatory Visit (HOSPITAL_COMMUNITY)
Admission: RE | Admit: 2016-12-16 | Discharge: 2016-12-16 | Disposition: A | Discharge status: Home / Self Care | Payer: PPO | Source: Ambulatory Visit | Attending: Interventional Radiology | Admitting: Interventional Radiology

## 2016-12-16 ENCOUNTER — Other Ambulatory Visit: Payer: Self-pay | Admitting: Interventional Radiology

## 2016-12-16 ENCOUNTER — Encounter (HOSPITAL_COMMUNITY): Payer: Self-pay

## 2016-12-16 ENCOUNTER — Other Ambulatory Visit (HOSPITAL_COMMUNITY): Payer: Self-pay | Admitting: Interventional Radiology

## 2016-12-16 DIAGNOSIS — K831 Obstruction of bile duct: Secondary | ICD-10-CM | POA: Diagnosis not present

## 2016-12-16 DIAGNOSIS — F419 Anxiety disorder, unspecified: Secondary | ICD-10-CM | POA: Insufficient documentation

## 2016-12-16 DIAGNOSIS — I110 Hypertensive heart disease with heart failure: Secondary | ICD-10-CM | POA: Diagnosis not present

## 2016-12-16 DIAGNOSIS — C221 Intrahepatic bile duct carcinoma: Secondary | ICD-10-CM

## 2016-12-16 DIAGNOSIS — Z7901 Long term (current) use of anticoagulants: Secondary | ICD-10-CM | POA: Insufficient documentation

## 2016-12-16 DIAGNOSIS — Z4682 Encounter for fitting and adjustment of non-vascular catheter: Secondary | ICD-10-CM | POA: Insufficient documentation

## 2016-12-16 DIAGNOSIS — I5020 Unspecified systolic (congestive) heart failure: Secondary | ICD-10-CM | POA: Insufficient documentation

## 2016-12-16 DIAGNOSIS — M1990 Unspecified osteoarthritis, unspecified site: Secondary | ICD-10-CM | POA: Diagnosis not present

## 2016-12-16 DIAGNOSIS — K219 Gastro-esophageal reflux disease without esophagitis: Secondary | ICD-10-CM | POA: Insufficient documentation

## 2016-12-16 DIAGNOSIS — Z8509 Personal history of malignant neoplasm of other digestive organs: Secondary | ICD-10-CM | POA: Diagnosis not present

## 2016-12-16 DIAGNOSIS — I509 Heart failure, unspecified: Secondary | ICD-10-CM | POA: Diagnosis not present

## 2016-12-16 HISTORY — PX: IR BILIARY STENT(S) EXISTING ACCESS INC DILATION CATH EXCHANGE: IMG6048

## 2016-12-16 LAB — PROTIME-INR
INR: 0.99
Prothrombin Time: 13.1 seconds (ref 11.4–15.2)

## 2016-12-16 LAB — COMPREHENSIVE METABOLIC PANEL
ALBUMIN: 3.1 g/dL — AB (ref 3.5–5.0)
ALT: 49 U/L (ref 17–63)
AST: 45 U/L — AB (ref 15–41)
Alkaline Phosphatase: 187 U/L — ABNORMAL HIGH (ref 38–126)
Anion gap: 7 (ref 5–15)
BUN: 24 mg/dL — ABNORMAL HIGH (ref 6–20)
CALCIUM: 8.4 mg/dL — AB (ref 8.9–10.3)
CHLORIDE: 103 mmol/L (ref 101–111)
CO2: 27 mmol/L (ref 22–32)
CREATININE: 1.2 mg/dL (ref 0.61–1.24)
GFR calc non Af Amer: 54 mL/min — ABNORMAL LOW (ref >60)
GLUCOSE: 100 mg/dL — AB (ref 65–99)
Potassium: 3.7 mmol/L (ref 3.5–5.1)
SODIUM: 137 mmol/L (ref 135–145)
Total Bilirubin: 1.6 mg/dL — ABNORMAL HIGH (ref 0.3–1.2)
Total Protein: 6.2 g/dL — ABNORMAL LOW (ref 6.5–8.1)

## 2016-12-16 LAB — CBC
HEMATOCRIT: 35.1 % — AB (ref 39.0–52.0)
HEMOGLOBIN: 11.6 g/dL — AB (ref 13.0–17.0)
MCH: 33 pg (ref 26.0–34.0)
MCHC: 33 g/dL (ref 30.0–36.0)
MCV: 99.7 fL (ref 78.0–100.0)
Platelets: 121 10*3/uL — ABNORMAL LOW (ref 150–400)
RBC: 3.52 MIL/uL — AB (ref 4.22–5.81)
RDW: 14.7 % (ref 11.5–15.5)
WBC: 5.7 10*3/uL (ref 4.0–10.5)

## 2016-12-16 LAB — APTT: APTT: 33 s (ref 24–36)

## 2016-12-16 MED ORDER — NALOXONE HCL 0.4 MG/ML IJ SOLN
INTRAMUSCULAR | Status: AC
  Filled 2016-12-16: qty 1

## 2016-12-16 MED ORDER — LIDOCAINE HCL 1 % IJ SOLN
INTRAMUSCULAR | Status: AC | PRN
  Administered 2016-12-16: 5 mL

## 2016-12-16 MED ORDER — SODIUM CHLORIDE 0.9 % IV SOLN
INTRAVENOUS | Status: DC
  Administered 2016-12-16 (×2): via INTRAVENOUS

## 2016-12-16 MED ORDER — MIDAZOLAM HCL 2 MG/2ML IJ SOLN
INTRAMUSCULAR | Status: AC | PRN
  Administered 2016-12-16 (×2): 0.5 mg via INTRAVENOUS
  Administered 2016-12-16 (×3): 1 mg via INTRAVENOUS

## 2016-12-16 MED ORDER — FENTANYL CITRATE (PF) 100 MCG/2ML IJ SOLN
INTRAMUSCULAR | Status: AC | PRN
  Administered 2016-12-16 (×3): 50 ug via INTRAVENOUS
  Administered 2016-12-16 (×2): 25 ug via INTRAVENOUS

## 2016-12-16 MED ORDER — LIDOCAINE HCL 1 % IJ SOLN
INTRAMUSCULAR | Status: AC
  Filled 2016-12-16: qty 20

## 2016-12-16 MED ORDER — IOPAMIDOL (ISOVUE-300) INJECTION 61%
INTRAVENOUS | Status: AC
  Administered 2016-12-16: 50 mL
  Filled 2016-12-16: qty 50

## 2016-12-16 MED ORDER — IOPAMIDOL (ISOVUE-300) INJECTION 61%
50.0000 mL | Freq: Once | INTRAVENOUS | Status: AC | PRN
  Administered 2016-12-16: 50 mL

## 2016-12-16 MED ORDER — PIPERACILLIN-TAZOBACTAM 3.375 G IVPB 30 MIN
3.3750 g | INTRAVENOUS | Status: AC
  Administered 2016-12-16: 3.375 g via INTRAVENOUS
  Filled 2016-12-16: qty 50

## 2016-12-16 MED ORDER — FLUMAZENIL 0.5 MG/5ML IV SOLN
INTRAVENOUS | Status: AC
  Filled 2016-12-16: qty 5

## 2016-12-16 MED ORDER — MIDAZOLAM HCL 2 MG/2ML IJ SOLN
INTRAMUSCULAR | Status: AC
  Filled 2016-12-16: qty 6

## 2016-12-16 MED ORDER — FENTANYL CITRATE (PF) 100 MCG/2ML IJ SOLN
INTRAMUSCULAR | Status: AC
  Filled 2016-12-16: qty 6

## 2016-12-16 NOTE — Discharge Instructions (Signed)
Biliary Drainage Catheter Placement, Care After This sheet gives you information about how to care for yourself after your procedure. Your health care provider may also give you more specific instructions. If you have problems or questions, contact your health care provider. What can I expect after the procedure? After the procedure, it is common to have:  Pain or soreness at the catheter insertion site.  Tiredness and sleepiness for several hours.  Some bruising at the catheter insertion site.  Drainage into the collection bag on the outside of your body, if you have an external drainage catheter.  You might see bloody discharge in the bag for the first 1 or 2 days.  Then, the discharge should turn a yellow-green color. Follow these instructions at home: Medicines   Take over-the-counter and prescription medicines for pain, discomfort, or fever only as told by your health care provider.  Do not take aspirin or blood thinners unless your health care provider says that you can. These can make bleeding worse.  Do not drive or use heavy machinery while taking prescription pain medicine. Catheter insertion site care   Clean the catheter insertion site as told by your health care provider.  Do not take baths, swim, or use a hot tub until your health care provider approves.  Take showers only. Before showering, cover the catheter insertion area with a watertight covering to keep the area dry.  Keep the skin around the catheter insertion site dry. If the area gets wet, dry the skin completely.  Check your catheter insertion site every day for signs of infection. Check for:  Redness, swelling, or pain.  Fluid or blood.  Warmth.  Pus or a bad smell. General instructions   Rest for the remainder of the day.  Do not drive, use machinery, or make legal decisions for 24 hours after your procedure.  Resume your usual diet. Avoid alcoholic beverages for 24 hours after your  procedure.  Keep all follow-up visits as told by your health care provider. This is important.  Drink enough fluid to keep your urine clear or pale yellow. Contact a health care provider if:  Your pain gets worse after it had improved, and it is not relieved with pain medicines.  You have any questions about caring for your drainage catheter or collection bag.  You have any of these around your catheter insertion site or coming from it:  Skin breakdown.  Redness, swelling, or pain.  Fluid or blood.  Warmth to the touch.  Pus or a bad smell. Get help right away if:  You have a fever or chills.  Your redness, swelling, or pain at the catheter insertion site gets worse, even though you are cleaning it well.  You have leakage of bile around the drainage catheter.  Your drainage catheter becomes blocked or clogged.  Your drainage catheter comes out. This information is not intended to replace advice given to you by your health care provider. Make sure you discuss any questions you have with your health care provider. Document Released: 04/01/2004 Document Revised: 07/07/2016 Document Reviewed: 07/07/2016 Elsevier Interactive Patient Education  2017 [place]. Moderate Conscious Sedation, Adult, Care After These instructions provide you with information about caring for yourself after your procedure. Your health care provider may also give you more specific instructions. Your treatment has been planned according to current medical practices, but problems sometimes occur. Call your health care provider if you have any problems or questions after your procedure. What can I expect  after the procedure? After your procedure, it is common:  To feel sleepy for several hours.  To feel clumsy and have poor balance for several hours.  To have poor judgment for several hours.  To vomit if you eat too soon. Follow these instructions at home: For at least 24 hours after the  procedure:    Do not:  Participate in activities where you could fall or become injured.  Drive.  Use heavy machinery.  Drink alcohol.  Take sleeping pills or medicines that cause drowsiness.  Make important decisions or sign legal documents.  Take care of children on your own.  Rest. Eating and drinking   Follow the diet recommended by your health care provider.  If you vomit:  Drink water, juice, or soup when you can drink without vomiting.  Make sure you have little or no nausea before eating solid foods. General instructions   Have a responsible adult stay with you until you are awake and alert.  Take over-the-counter and prescription medicines only as told by your health care provider.  If you smoke, do not smoke without supervision.  Keep all follow-up visits as told by your health care provider. This is important. Contact a health care provider if:  You keep feeling nauseous or you keep vomiting.  You feel light-headed.  You develop a rash.  You have a fever. Get help right away if:  You have trouble breathing. This information is not intended to replace advice given to you by your health care provider. Make sure you discuss any questions you have with your health care provider. Document Released: 06/08/2013 Document Revised: 01/21/2016 Document Reviewed: 12/08/2015 Elsevier Interactive Patient Education  2017 [employer].

## 2016-12-16 NOTE — Procedures (Signed)
Interventional Radiology Procedure Note  Procedure: Through the tube cholangiogram, placement of covered 6cm Wall-flex stent in the CBD across stricture.  Placement of capped Int/Ext drain through the stent.  Complications: None Recommendations:  - 1 hour recovery - Do not submerge until the Int/ext is removed - May return for drain patency check at [facility] in ~2 weeks.  Repeat cholangiogram and possible exchange. - Routine drain care.  Signed,  Dulcy Fanny. Earleen Newport, DO

## 2016-12-16 NOTE — H&P (Signed)
Referring Physician(s): Kale,G  Supervising Physician: Corrie Mckusick  Patient Status: [facility]  Chief Complaint: Cholangiocarcinoma, biliary obstruction   Subjective: Jimmy Myers is a 83 y.o. male who presented in late February with progressive jaundice. ERCP was unsuccessful for biliary drainage due to high-grade obstruction of the common bile duct/common hepatic duct centrally. He underwent placement of a 10 French percutaneous internal/external biliary drainage catheter by Dr. Annamaria Boots on 10/25/2016 via right lobe ductal access. Endoluminal brush biopsy at the level of common bile duct obstruction was performed on 10/28/2016 revealing adenocarcinoma with diagnosis consistent with cholangiocarcinoma. The biliary drainage catheter was exchanged on 11/21/2016 by Dr. Earleen Newport due to some drainage around the skin exit site. The drain was capped at that time for a trial of internal drainage. Bilirubin continues to decrease and the patient has tolerated internal capping well without symptoms.He presents again today following discussion with Dr. Kathlene Cote for follow-up cholangiogram along with possible metallic biliary stent placement. He currently  denies fever, headache, dyspnea, cough, back pain, nausea, vomiting or abnormal bleeding. He does have some occasional chest discomfort and mild tenderness at the biliary drain insertion site. Past Medical History:  Diagnosis Date  . A-fib [facility]) May 2013   s/p TEE/cardioversion June 2013  . Anxiety   . Aortic root enlargement (HCC)    at least 5cm by CT  . Arthritis   . Atrial thrombus   . Bradycardia   . Bronchitis   . Cancer ([place]) 2016   lymphoma  . Chronic anticoagulation    on coumadin - checked in [place]  . Congestive heart failure (CHF) ([place]) 2013  . Diverticulosis   . Dysrhythmia    AFib  . GERD (gastroesophageal reflux disease)   . Hemorrhoids   . HTN (hypertension)   . Pneumonia May 2013  . Pneumonia 2013  . Systolic congestive heart  failure with reduced left ventricular function, NYHA class 1 (HCC)    EF is 20 to 25% per echo; felt to be due to reversible tachycardia induced CM  . Thrombocytopenia ([place])    Past Surgical History:  Procedure Laterality Date  . CARDIOVERSION  12/30/2011   Procedure: CARDIOVERSION;  Surgeon: Lelon Perla, MD;  Location: [facility] ENDOSCOPY;  Service: Cardiovascular;  Laterality: N/A;  . CARDIOVERSION  02/16/2012   Procedure: CARDIOVERSION;  Surgeon: Lelon Perla, MD;  Location: [facility] ENDOSCOPY;  Service: Cardiovascular;  Laterality: N/A;  . CATARACT EXTRACTION W/PHACO Left 07/28/2016   Procedure: CATARACT EXTRACTION PHACO AND INTRAOCULAR LENS PLACEMENT LEFT EYE:  CDE: 13.00;  Surgeon: Tonny Branch, MD;  Location: [facility] ORS;  Service: Ophthalmology;  Laterality: Left;  . ENDOSCOPIC RETROGRADE CHOLANGIOPANCREATOGRAPHY (ERCP) WITH PROPOFOL N/A 10/24/2016   Procedure: ENDOSCOPIC RETROGRADE CHOLANGIOPANCREATOGRAPHY (ERCP) WITH PROPOFOL;  Surgeon: Doran Stabler, MD;  Location: [facility] ENDOSCOPY;  Service: Endoscopy;  Laterality: N/A;  . ESOPHAGOGASTRODUODENOSCOPY  12/30/2011   Procedure: ESOPHAGOGASTRODUODENOSCOPY (EGD);  Surgeon: Lelon Perla, MD;  Location: [facility] ENDOSCOPY;  Service: Cardiovascular;  Laterality: N/A;  . ESOPHAGOGASTRODUODENOSCOPY  12/30/2011   Procedure: ESOPHAGOGASTRODUODENOSCOPY (EGD);  Surgeon: Lafayette Dragon, MD;  Location: [facility] ENDOSCOPY;  Service: Endoscopy;  Laterality: N/A;  . IR GENERIC HISTORICAL  10/25/2016   IR INT EXT BILIARY DRAIN WITH CHOLANGIOGRAM 10/25/2016 Greggory Keen, MD [facility]INTER[facility] RAD  . IR GENERIC HISTORICAL  10/28/2016   IR EXCHANGE BILIARY DRAIN 10/28/2016 Greggory Keen, MD [facility]  . IR GENERIC HISTORICAL  10/28/2016   IR ENDOLUMINAL BX OF BILIARY TREE 10/28/2016 Greggory Keen, MD  [facility]INTER[facility]  . IR GENERIC HISTORICAL  11/21/2016   IR EXCHANGE BILIARY DRAIN 11/21/2016 Corrie Mckusick, DO [facility]INTERV RAD  . KNEE ARTHROSCOPY  rt knee  . PROSTATE SURGERY    . TEE WITHOUT  CARDIOVERSION  12/30/2011   Procedure: TRANSESOPHAGEAL ECHOCARDIOGRAM (TEE);  Surgeon: Lelon Perla, MD;  Location: [facility] ENDOSCOPY;  Service: Cardiovascular;  Laterality: N/A;  . TEE WITHOUT CARDIOVERSION  02/16/2012   Procedure: TRANSESOPHAGEAL ECHOCARDIOGRAM (TEE);  Surgeon: Lelon Perla, MD;  Location: [facility] ENDOSCOPY;  Service: Cardiovascular;  Laterality: N/A;      Allergies: Codeine; Simvastatin; and Vicodin [hydrocodone-acetaminophen]  Medications: Prior to Admission medications   Medication Sig Start Date End Date Taking? Authorizing Provider  acetaminophen (TYLENOL) 325 MG tablet Take 325 mg by mouth every 6 (six) hours as needed.   Yes Historical Provider, MD  clorazepate (TRANXENE) 3.75 MG tablet Give .05 tablet by mouth daily as needed for anxiety   Yes Historical Provider, MD  folic acid (FOLVITE) 627 MCG tablet Take 800 mcg by mouth daily.   Yes Historical Provider, MD  furosemide (LASIX) 40 MG tablet TAKE ONE TABLET BY MOUTH ONCE DAILY 09/26/16  Yes Burtis Junes, NP  omeprazole (PRILOSEC) 20 MG capsule Take 20 mg by mouth daily before breakfast.    Yes Historical Provider, MD  polyethylene glycol (MIRALAX / GLYCOLAX) packet Take 17 g by mouth at bedtime as needed.    Yes Historical Provider, MD  albuterol (PROVENTIL HFA;VENTOLIN HFA) 108 (90 BASE) MCG/ACT inhaler Inhale 2 puffs into the lungs every 6 (six) hours as needed for wheezing or shortness of breath.    Historical Provider, MD  fluticasone (FLONASE) 50 MCG/ACT nasal spray Place 1 spray into both nostrils daily as needed for allergies or rhinitis.    Historical Provider, MD  nitroGLYCERIN (NITROSTAT) 0.4 MG SL tablet Place 1 tablet (0.4 mg total) under the tongue every 5 (five) minutes x 3 doses as needed for chest pain. 05/15/14 10/06/17  Burtis Junes, NP     Vital Signs: BP 133/88 (BP Location: Right Arm)   Pulse 79   Temp 97.5 F (36.4 C) (Oral)   Resp 16   Ht 5\' 10"  (1.778 m)   Wt 196 lb (88.9 kg)  Comment: pt states he weighed at home this am   SpO2 100%   BMI 28.12 kg/m   Physical Exam Awake, alert. Chest with few left basilar crackles, right clear. Heart with occasional ectopy noted, normal rate; abdomen soft, positive bowel sounds, intact capped right biliary drain, site mildly tender; LE with trace edema bilat  Imaging: No results found.  Labs:  CBC:  Recent Labs  11/10/16 1207 11/24/16 1127 12/11/16 0819 12/16/16 1204  WBC 7.1 5.9 5.3 5.7  HGB 12.6* 11.9* 11.6* 11.6*  HCT 37.6* 36.6* 35.8* 35.1*  PLT 167 100* 122* 121*    COAGS:  Recent Labs  10/24/16 0731 10/25/16 0520 10/26/16 0703 12/16/16 1204  INR 1.25 1.01 0.96 0.99  APTT  --   --   --  33    BMP:  Recent Labs  10/29/16 0514 10/30/16 0451 10/31/16 0524  11/10/16 1207 11/24/16 1128 12/08/16 1238 12/11/16 0819  NA 139 139 138  < > 137 137 142 144  K 3.5 4.0 3.8  < > 4.5 5.3 no hemolysis noted* 5.7* 3.8  CL 108 110 108  --   --   --  101  --   CO2 24 24 22   --  20*  25 27 28   GLUCOSE 103* 104* 107*  --  106 136 93 111  BUN 27* 29* 30*  < > 48.8* 24.9 15 17.2  CALCIUM 8.4* 8.4* 8.6*  --  9.1 9.0 8.8 8.8  CREATININE 1.11 1.16 1.17  < > 1.6* 1.4* 0.99 1.1  GFRNONAA 60* 57* 56*  --   --   --  71  --   GFRAA >60 >60 >60  --   --   --  82  --   < > = values in this interval not displayed.  LIVER FUNCTION TESTS:  Recent Labs  11/10/16 1207 11/24/16 1128 12/08/16 1238 12/11/16 0819  BILITOT 8.05* 3.26* 1.5* 1.64*  AST 87* 58* 38 41*  ALT 122* 92* 48* 48  ALKPHOS 282* 288* 194* 198*  PROT 6.7 6.0* 5.9* 6.1*  ALBUMIN 3.1* 2.8* 3.6 3.0*    Assessment and Plan: 83 y.o. male who presented in late February with progressive jaundice. ERCP was unsuccessful for biliary drainage due to high-grade obstruction of the common bile duct/common hepatic duct centrally. He underwent placement of a 10 French percutaneous internal/external biliary drainage catheter by Dr. Annamaria Boots on 10/25/2016 via right  lobe ductal access. Endoluminal brush biopsy at the level of common bile duct obstruction was performed on 10/28/2016 revealing adenocarcinoma with diagnosis consistent with cholangiocarcinoma. The biliary drainage catheter was exchanged on 11/21/2016 by Dr. Earleen Newport due to some drainage around the skin exit site. The drain was capped at that time for a trial of internal drainage. Bilirubin continues to decrease and the patient has tolerated internal capping well without symptoms.He presents again today following discussion with Dr. Kathlene Cote for follow-up cholangiogram along with possible metallic biliary stent placement. He has decided against surgery at this time and will undergo radiation therapy.Details/risks of procedures, including but not limited to, internal bleeding, infection, inability to place stent and need for prolonged external drainage discussed with patient and wife with their understanding and consent.   Electronically Signed: D. Rowe Robert 12/16/2016, 1:12 PM   I spent a total of 20 minutes at the the patient's bedside AND on the patient's hospital floor or unit, greater than 50% of which was counseling/coordinating care for cholangiogram with possible metallic biliary stent placement

## 2016-12-17 ENCOUNTER — Ambulatory Visit
Admission: RE | Admit: 2016-12-17 | Discharge: 2016-12-17 | Disposition: A | Discharge status: Home / Self Care | Payer: PPO | Source: Ambulatory Visit | Attending: Radiation Oncology | Admitting: Radiation Oncology

## 2016-12-17 ENCOUNTER — Encounter: Payer: Self-pay | Admitting: Radiation Oncology

## 2016-12-17 VITALS — BP 134/83 | HR 86 | Temp 98.4°F | Resp 20 | Wt 202.8 lb

## 2016-12-17 VITALS — BP 163/96 | HR 78 | Temp 98.0°F | Resp 18

## 2016-12-17 DIAGNOSIS — C221 Intrahepatic bile duct carcinoma: Secondary | ICD-10-CM

## 2016-12-17 DIAGNOSIS — Z510 Encounter for antineoplastic radiation therapy: Secondary | ICD-10-CM | POA: Diagnosis not present

## 2016-12-17 MED ORDER — SODIUM CHLORIDE 0.9% FLUSH
10.0000 mL | Freq: Once | INTRAVENOUS | Status: AC
  Administered 2016-12-17: 10 mL via INTRAVENOUS

## 2016-12-17 NOTE — Progress Notes (Signed)
Has armband been applied?  Yes.    Does patient have an allergy to IV contrast dye?: No.   Has patient ever received premedication for IV contrast dye?: No.   Does patient take metformin?: No.  If patient does take metformin when was the last dose: N/A  Date of lab work: 12/16/16 BUN: 24 CR: 1.20  IV site: LAVC # 22g  1 inch catheter, excellent blood return, x 1 attempt,  patient tolerated well,   Has IV site been added to flowsheet?  YES, Patient has on right forearm, bleeding on gause, he pulled dressing off yesterday and pulled skin off, dry intact, he has put neosporin     BP 134/83 (BP Location: Left Arm, Patient Position: Sitting, Cuff Size: Normal)   Pulse 86   Temp 98.4 F (36.9 C) (Oral)   Resp 20   Wt 202 lb 12.8 oz (92 kg)   SpO2 100% Comment: room air  BMI 29.10 kg/m

## 2016-12-17 NOTE — Progress Notes (Signed)
1500 Vital signs taken stable. .   1505 IV removed from left antecubital skin intact, warm and dry.   15010 Redressed right forearm skin tear applied telfa and 4x4's and ABD applied paper tape Asked to see PCP if he has any signs of infection to the right forearm skin tear.  No other complaints. [place] with wife ambulatory with cane.

## 2016-12-22 DIAGNOSIS — F419 Anxiety disorder, unspecified: Secondary | ICD-10-CM | POA: Diagnosis not present

## 2016-12-22 DIAGNOSIS — Q443 Congenital stenosis and stricture of bile ducts: Secondary | ICD-10-CM | POA: Diagnosis not present

## 2016-12-22 DIAGNOSIS — I4891 Unspecified atrial fibrillation: Secondary | ICD-10-CM | POA: Diagnosis not present

## 2016-12-22 DIAGNOSIS — K831 Obstruction of bile duct: Secondary | ICD-10-CM | POA: Diagnosis not present

## 2016-12-22 DIAGNOSIS — I77810 Thoracic aortic ectasia: Secondary | ICD-10-CM | POA: Diagnosis not present

## 2016-12-22 DIAGNOSIS — M1990 Unspecified osteoarthritis, unspecified site: Secondary | ICD-10-CM | POA: Diagnosis not present

## 2016-12-22 DIAGNOSIS — C221 Intrahepatic bile duct carcinoma: Secondary | ICD-10-CM | POA: Diagnosis not present

## 2016-12-22 DIAGNOSIS — I110 Hypertensive heart disease with heart failure: Secondary | ICD-10-CM | POA: Diagnosis not present

## 2016-12-22 DIAGNOSIS — I5022 Chronic systolic (congestive) heart failure: Secondary | ICD-10-CM | POA: Diagnosis not present

## 2016-12-22 DIAGNOSIS — Z4801 Encounter for change or removal of surgical wound dressing: Secondary | ICD-10-CM | POA: Diagnosis not present

## 2016-12-23 ENCOUNTER — Other Ambulatory Visit: Payer: PPO | Admitting: *Deleted

## 2016-12-23 DIAGNOSIS — I5022 Chronic systolic (congestive) heart failure: Secondary | ICD-10-CM | POA: Diagnosis not present

## 2016-12-23 DIAGNOSIS — E875 Hyperkalemia: Secondary | ICD-10-CM | POA: Diagnosis not present

## 2016-12-23 LAB — BASIC METABOLIC PANEL
BUN/Creatinine Ratio: 22 (ref 10–24)
BUN: 25 mg/dL (ref 8–27)
CO2: 25 mmol/L (ref 18–29)
Calcium: 8.8 mg/dL (ref 8.6–10.2)
Chloride: 103 mmol/L (ref 96–106)
Creatinine, Ser: 1.16 mg/dL (ref 0.76–1.27)
GFR calc Af Amer: 67 mL/min/{1.73_m2} (ref >59)
GFR calc non Af Amer: 58 mL/min/{1.73_m2} — ABNORMAL LOW (ref >59)
Glucose: 108 mg/dL — ABNORMAL HIGH (ref 65–99)
Potassium: 4.3 mmol/L (ref 3.5–5.2)
Sodium: 144 mmol/L (ref 134–144)

## 2016-12-23 LAB — PRO B NATRIURETIC PEPTIDE: NT-Pro BNP: 5245 pg/mL — ABNORMAL HIGH (ref 0–486)

## 2016-12-23 NOTE — Addendum Note (Signed)
Addended by: Eulis Foster on: 12/23/2016 09:52 AM   Modules accepted: Orders

## 2016-12-23 NOTE — Progress Notes (Signed)
PROBNP

## 2016-12-23 NOTE — Addendum Note (Signed)
Addended by: Eulis Foster on: 12/23/2016 09:51 AM   Modules accepted: Orders

## 2016-12-24 ENCOUNTER — Other Ambulatory Visit: Payer: Self-pay | Admitting: *Deleted

## 2016-12-24 DIAGNOSIS — I5022 Chronic systolic (congestive) heart failure: Secondary | ICD-10-CM

## 2016-12-24 MED ORDER — POTASSIUM CHLORIDE ER 10 MEQ PO TBCR: 10.0000 meq | EXTENDED_RELEASE_TABLET | Freq: Every day | ORAL | 3 refills | Status: DC

## 2016-12-29 ENCOUNTER — Ambulatory Visit (INDEPENDENT_AMBULATORY_CARE_PROVIDER_SITE_OTHER): Payer: PPO | Admitting: Family Medicine

## 2016-12-29 DIAGNOSIS — C221 Intrahepatic bile duct carcinoma: Secondary | ICD-10-CM

## 2016-12-29 DIAGNOSIS — I77810 Thoracic aortic ectasia: Secondary | ICD-10-CM | POA: Diagnosis not present

## 2016-12-29 DIAGNOSIS — Z4801 Encounter for change or removal of surgical wound dressing: Secondary | ICD-10-CM | POA: Diagnosis not present

## 2016-12-29 DIAGNOSIS — K831 Obstruction of bile duct: Secondary | ICD-10-CM

## 2016-12-29 DIAGNOSIS — I5022 Chronic systolic (congestive) heart failure: Secondary | ICD-10-CM | POA: Diagnosis not present

## 2016-12-29 DIAGNOSIS — I4891 Unspecified atrial fibrillation: Secondary | ICD-10-CM

## 2016-12-29 DIAGNOSIS — M1990 Unspecified osteoarthritis, unspecified site: Secondary | ICD-10-CM

## 2016-12-29 DIAGNOSIS — Q443 Congenital stenosis and stricture of bile ducts: Secondary | ICD-10-CM

## 2016-12-29 DIAGNOSIS — F419 Anxiety disorder, unspecified: Secondary | ICD-10-CM

## 2016-12-29 DIAGNOSIS — I110 Hypertensive heart disease with heart failure: Secondary | ICD-10-CM

## 2016-12-29 DIAGNOSIS — Z8701 Personal history of pneumonia (recurrent): Secondary | ICD-10-CM | POA: Diagnosis not present

## 2016-12-30 ENCOUNTER — Ambulatory Visit (HOSPITAL_COMMUNITY)
Admission: RE | Admit: 2016-12-30 | Discharge: 2016-12-30 | Disposition: A | Discharge status: Home / Self Care | Payer: PPO | Source: Ambulatory Visit | Attending: Interventional Radiology | Admitting: Interventional Radiology

## 2016-12-30 ENCOUNTER — Encounter (HOSPITAL_COMMUNITY): Payer: Self-pay | Admitting: Diagnostic Radiology

## 2016-12-30 ENCOUNTER — Other Ambulatory Visit (HOSPITAL_COMMUNITY): Payer: Self-pay | Admitting: Interventional Radiology

## 2016-12-30 DIAGNOSIS — K831 Obstruction of bile duct: Secondary | ICD-10-CM | POA: Insufficient documentation

## 2016-12-30 DIAGNOSIS — C221 Intrahepatic bile duct carcinoma: Secondary | ICD-10-CM

## 2016-12-30 DIAGNOSIS — Z4682 Encounter for fitting and adjustment of non-vascular catheter: Secondary | ICD-10-CM | POA: Diagnosis not present

## 2016-12-30 HISTORY — PX: IR REMOVAL BILIARY DRAIN: IMG6047

## 2016-12-30 MED ORDER — LIDOCAINE HCL 1 % IJ SOLN
INTRAMUSCULAR | Status: AC
  Filled 2016-12-30: qty 20

## 2016-12-30 MED ORDER — IOPAMIDOL (ISOVUE-300) INJECTION 61%
INTRAVENOUS | Status: AC | PRN
  Administered 2016-12-30: 30 mL

## 2016-12-30 MED ORDER — IOPAMIDOL (ISOVUE-300) INJECTION 61%
INTRAVENOUS | Status: AC
  Filled 2016-12-30: qty 50

## 2016-12-30 MED ORDER — IOPAMIDOL (ISOVUE-300) INJECTION 61%: 10.0000 mL | Freq: Once | INTRAVENOUS | Status: DC | PRN

## 2016-12-30 NOTE — Procedures (Signed)
Cholangiogram thru existing tube.  The biliary stent is patent.  Patient tolerated a capping trial well.  Biliary drain was completely removed.  No bleeding.  No immediate complication.

## 2017-01-01 ENCOUNTER — Ambulatory Visit: Payer: PPO | Admitting: Family Medicine

## 2017-01-01 DIAGNOSIS — M1990 Unspecified osteoarthritis, unspecified site: Secondary | ICD-10-CM | POA: Diagnosis not present

## 2017-01-01 DIAGNOSIS — I5022 Chronic systolic (congestive) heart failure: Secondary | ICD-10-CM | POA: Diagnosis not present

## 2017-01-01 DIAGNOSIS — C221 Intrahepatic bile duct carcinoma: Secondary | ICD-10-CM | POA: Diagnosis not present

## 2017-01-01 DIAGNOSIS — Q443 Congenital stenosis and stricture of bile ducts: Secondary | ICD-10-CM | POA: Diagnosis not present

## 2017-01-01 DIAGNOSIS — Z4801 Encounter for change or removal of surgical wound dressing: Secondary | ICD-10-CM | POA: Diagnosis not present

## 2017-01-01 DIAGNOSIS — I110 Hypertensive heart disease with heart failure: Secondary | ICD-10-CM | POA: Diagnosis not present

## 2017-01-01 DIAGNOSIS — Z510 Encounter for antineoplastic radiation therapy: Secondary | ICD-10-CM | POA: Diagnosis not present

## 2017-01-01 DIAGNOSIS — K831 Obstruction of bile duct: Secondary | ICD-10-CM | POA: Diagnosis not present

## 2017-01-01 DIAGNOSIS — I77810 Thoracic aortic ectasia: Secondary | ICD-10-CM | POA: Diagnosis not present

## 2017-01-01 DIAGNOSIS — I4891 Unspecified atrial fibrillation: Secondary | ICD-10-CM | POA: Diagnosis not present

## 2017-01-01 DIAGNOSIS — F419 Anxiety disorder, unspecified: Secondary | ICD-10-CM | POA: Diagnosis not present

## 2017-01-05 ENCOUNTER — Ambulatory Visit
Admission: RE | Admit: 2017-01-05 | Discharge: 2017-01-05 | Disposition: A | Discharge status: Home / Self Care | Payer: PPO | Source: Ambulatory Visit | Attending: Radiation Oncology | Admitting: Radiation Oncology

## 2017-01-05 DIAGNOSIS — Z510 Encounter for antineoplastic radiation therapy: Secondary | ICD-10-CM | POA: Diagnosis not present

## 2017-01-07 ENCOUNTER — Ambulatory Visit
Admission: RE | Admit: 2017-01-07 | Discharge: 2017-01-07 | Disposition: A | Discharge status: Home / Self Care | Payer: PPO | Source: Ambulatory Visit | Attending: Radiation Oncology | Admitting: Radiation Oncology

## 2017-01-07 DIAGNOSIS — Z510 Encounter for antineoplastic radiation therapy: Secondary | ICD-10-CM | POA: Diagnosis not present

## 2017-01-09 ENCOUNTER — Ambulatory Visit
Admission: RE | Admit: 2017-01-09 | Discharge: 2017-01-09 | Disposition: A | Discharge status: Home / Self Care | Payer: PPO | Source: Ambulatory Visit | Attending: Radiation Oncology | Admitting: Radiation Oncology

## 2017-01-09 DIAGNOSIS — Z510 Encounter for antineoplastic radiation therapy: Secondary | ICD-10-CM | POA: Diagnosis not present

## 2017-01-09 NOTE — Progress Notes (Signed)
[facility] Simulation and Treatment Planning Note   Name:  Jimmy Myers MRN: [id number]   Date: 01/09/2017  DOB: 1932/02/26  Status:outpatient    DIAGNOSIS:    ICD-9-CM ICD-10-CM   1. Cholangiocarcinoma ([place]) 155.1 C22.1      CONSENT VERIFIED:yes   SET UP: Patient is setup supine   IMMOBILIZATION: The patient was immobilized using a Vac Loc bag and Abdominal Compression.   NARRATIVE:The patient was brought to the [place].  Identity was confirmed.  All relevant records and images related to the planned course of therapy were reviewed.  Then, the patient was positioned in a stable reproducible clinical set-up for radiation therapy. Abdominal compression was applied by me.  4D CT images were obtained and reproducible breathing pattern was confirmed. Free breathing CT images were obtained.  Skin markings were placed.  The CT images were loaded into the planning software where the target and avoidance structures were contoured.  The radiation prescription was entered and confirmed.    TREATMENT PLANNING NOTE:  Treatment planning then occurred. I have requested : MLC's, isodose plan, basic dose calculation.  3 dimensional simulation is performed and dose volume histogram of the gross tumor volume, planning tumor volume and criticial normal structures including the spinal cord and lungs were analyzed and requested.  Special treatment procedure was performed due to high dose per fraction.  The patient will be monitored for increased risk of toxicity.  Daily imaging using cone beam CT will be used for target localization.  I anticipate that the patient will receive 33 Gy in 5 fractions to target volume. Further adjustments will be made based on the planning process is necessary.  ------------------------------------------------  Jodelle Gross, MD, PhD

## 2017-01-12 ENCOUNTER — Other Ambulatory Visit: Payer: PPO | Admitting: *Deleted

## 2017-01-12 DIAGNOSIS — I5022 Chronic systolic (congestive) heart failure: Secondary | ICD-10-CM

## 2017-01-12 LAB — PRO B NATRIURETIC PEPTIDE: NT-Pro BNP: 4653 pg/mL — ABNORMAL HIGH (ref 0–486)

## 2017-01-12 NOTE — Addendum Note (Signed)
Addended by: Eulis Foster on: 01/12/2017 09:27 AM   Modules accepted: Orders

## 2017-01-13 ENCOUNTER — Other Ambulatory Visit: Payer: Self-pay | Admitting: *Deleted

## 2017-01-13 ENCOUNTER — Ambulatory Visit
Admission: RE | Admit: 2017-01-13 | Discharge: 2017-01-13 | Disposition: A | Discharge status: Home / Self Care | Payer: PPO | Source: Ambulatory Visit | Attending: Radiation Oncology | Admitting: Radiation Oncology

## 2017-01-13 ENCOUNTER — Other Ambulatory Visit: Payer: PPO | Admitting: *Deleted

## 2017-01-13 ENCOUNTER — Encounter: Payer: Self-pay | Admitting: *Deleted

## 2017-01-13 DIAGNOSIS — I480 Paroxysmal atrial fibrillation: Secondary | ICD-10-CM

## 2017-01-13 DIAGNOSIS — Z510 Encounter for antineoplastic radiation therapy: Secondary | ICD-10-CM | POA: Diagnosis not present

## 2017-01-14 ENCOUNTER — Telehealth: Payer: Self-pay | Admitting: Nurse Practitioner

## 2017-01-14 LAB — BASIC METABOLIC PANEL
BUN/Creatinine Ratio: 26 — ABNORMAL HIGH (ref 10–24)
BUN: 29 mg/dL — ABNORMAL HIGH (ref 8–27)
CO2: 24 mmol/L (ref 18–29)
Calcium: 8.9 mg/dL (ref 8.6–10.2)
Chloride: 101 mmol/L (ref 96–106)
Creatinine, Ser: 1.1 mg/dL (ref 0.76–1.27)
GFR calc Af Amer: 72 mL/min/{1.73_m2} (ref >59)
GFR calc non Af Amer: 62 mL/min/{1.73_m2} (ref >59)
Glucose: 89 mg/dL (ref 65–99)
Potassium: 4.6 mmol/L (ref 3.5–5.2)
Sodium: 142 mmol/L (ref 134–144)

## 2017-01-14 NOTE — Telephone Encounter (Signed)
Returned call to patient.Lab results not available.Advised I will send message to [facility].

## 2017-01-14 NOTE — Telephone Encounter (Signed)
Follow Up:   Pt would like his lab results from Monday and Tuesday.

## 2017-01-15 ENCOUNTER — Ambulatory Visit
Admission: RE | Admit: 2017-01-15 | Discharge: 2017-01-15 | Disposition: A | Discharge status: Home / Self Care | Payer: PPO | Source: Ambulatory Visit | Attending: Radiation Oncology | Admitting: Radiation Oncology

## 2017-01-15 DIAGNOSIS — Z510 Encounter for antineoplastic radiation therapy: Secondary | ICD-10-CM | POA: Diagnosis not present

## 2017-01-15 DIAGNOSIS — C221 Intrahepatic bile duct carcinoma: Secondary | ICD-10-CM | POA: Diagnosis not present

## 2017-01-15 NOTE — Telephone Encounter (Signed)
Follow up    Pt is calling for Jimmy Myers.

## 2017-01-15 NOTE — Telephone Encounter (Signed)
Follow up   Pt is returning your call for lab results

## 2017-01-16 NOTE — Telephone Encounter (Signed)
Agree - would stay on current regimen. See back as planned. Daily weights and salt restriction. No change in medicines for now.

## 2017-01-16 NOTE — Telephone Encounter (Signed)
Since weight down and swelling down, continue current dose of Lasix and potassium. Will leave for Truitt Merle, NP for further recommendations, if any. Richardson Dopp, PA-C    01/16/2017 5:09 PM

## 2017-01-16 NOTE — Telephone Encounter (Signed)
Returned call to patients-results given:  Notes recorded by Burtis Junes, NP on 01/14/2017 at 5:57 PM EDT Ok to report. The fluid level remains high - can we find out how he is doing - how is his weight? Any swelling, etc.   Patient reports his weight is 194 lbs, reports no SOB, also reports decrease in swelling.     Advised I would make Cecille Rubin aware (OOO today) and will return call with further recommendations.     Patient wondering if he should continue current dosage of potassium 69meq-advised to continue current regimen until further instructions.  Patient aware and verbalized understanding.

## 2017-01-16 NOTE — Telephone Encounter (Signed)
°  Follow Up   Calling regarding lab results. Pt states okay to leave results on his VM.

## 2017-01-19 NOTE — Telephone Encounter (Signed)
lvm pt is to stay on current dose of lasix and potassium.

## 2017-01-21 ENCOUNTER — Ambulatory Visit: Payer: PPO | Admitting: Nurse Practitioner

## 2017-01-21 ENCOUNTER — Encounter: Payer: Self-pay | Admitting: Radiation Oncology

## 2017-01-21 NOTE — Progress Notes (Signed)
  Radiation Oncology         ([phone] ________________________________  Name: STANFORD STRAUCH MRN: [id number]  Date: 01/21/2017  DOB: [birth year]  End of Treatment Note  Diagnosis: 83 year old gentleman with cholangiocarcinoma and status post percutaneous biliary drainage with internal/external biliary drainage catheter placement on 10/25/16.     Indication for treatment: Curative    Radiation treatment dates: 01/05/17-01/15/17  Site/dose: Abdomen/ 33 Gy in 5 fractions  Beams/energy:  SBRT SRT-3D/ 10xFFF   Narrative: The patient tolerated radiation treatment relatively well. Patient had no complaints of pain and noted a good appetite during treatment.  Plan: The patient has completed radiation treatment. The patient will return to radiation oncology clinic for routine followup in one month. I advised them to call or return sooner if they have any questions or concerns related to their recovery or treatment.  ------------------------------------------------  Jodelle Gross, MD, PhD  This document serves as a record of services personally performed by Kyung Rudd, MD. It was created on his behalf by Bethann Humble, a trained medical scribe. The creation of this record is based on the scribe's personal observations and the provider's statements to them. This document has been checked and approved by the attending provider.

## 2017-01-22 ENCOUNTER — Telehealth: Payer: Self-pay | Admitting: Hematology

## 2017-01-22 ENCOUNTER — Ambulatory Visit (HOSPITAL_BASED_OUTPATIENT_CLINIC_OR_DEPARTMENT_OTHER): Payer: PPO | Admitting: Hematology

## 2017-01-22 ENCOUNTER — Other Ambulatory Visit (HOSPITAL_BASED_OUTPATIENT_CLINIC_OR_DEPARTMENT_OTHER): Payer: PPO

## 2017-01-22 ENCOUNTER — Encounter: Payer: Self-pay | Admitting: Hematology

## 2017-01-22 VITALS — BP 124/74 | HR 74 | Temp 97.7°F | Resp 18 | Ht 70.0 in | Wt 202.1 lb

## 2017-01-22 DIAGNOSIS — C221 Intrahepatic bile duct carcinoma: Secondary | ICD-10-CM

## 2017-01-22 DIAGNOSIS — R7989 Other specified abnormal findings of blood chemistry: Secondary | ICD-10-CM | POA: Diagnosis not present

## 2017-01-22 DIAGNOSIS — C880 Waldenstrom macroglobulinemia: Secondary | ICD-10-CM

## 2017-01-22 LAB — CBC & DIFF AND RETIC
BASO%: 0.3 % (ref 0.0–2.0)
Basophils Absolute: 0 10*3/uL (ref 0.0–0.1)
EOS%: 1.9 % (ref 0.0–7.0)
Eosinophils Absolute: 0.1 10*3/uL (ref 0.0–0.5)
HEMATOCRIT: 37.6 % — AB (ref 38.4–49.9)
HEMOGLOBIN: 12.2 g/dL — AB (ref 13.0–17.1)
Immature Retic Fract: 1.9 % — ABNORMAL LOW (ref 3.00–10.60)
LYMPH#: 0.9 10*3/uL (ref 0.9–3.3)
LYMPH%: 23.1 % (ref 14.0–49.0)
MCH: 32.4 pg (ref 27.2–33.4)
MCHC: 32.4 g/dL (ref 32.0–36.0)
MCV: 99.7 fL — ABNORMAL HIGH (ref 79.3–98.0)
MONO#: 0.5 10*3/uL (ref 0.1–0.9)
MONO%: 13.5 % (ref 0.0–14.0)
NEUT#: 2.3 10*3/uL (ref 1.5–6.5)
NEUT%: 61.2 % (ref 39.0–75.0)
PLATELETS: 97 10*3/uL — AB (ref 140–400)
RBC: 3.77 10*6/uL — ABNORMAL LOW (ref 4.20–5.82)
RDW: 13.3 % (ref 11.0–14.6)
RETIC %: 0.93 % (ref 0.80–1.80)
RETIC CT ABS: 35.06 10*3/uL (ref 34.80–93.90)
WBC: 3.8 10*3/uL — ABNORMAL LOW (ref 4.0–10.3)

## 2017-01-22 LAB — COMPREHENSIVE METABOLIC PANEL
ALK PHOS: 133 U/L (ref 40–150)
ALT: 33 U/L (ref 0–55)
ANION GAP: 7 meq/L (ref 3–11)
AST: 32 U/L (ref 5–34)
Albumin: 3.6 g/dL (ref 3.5–5.0)
BILIRUBIN TOTAL: 0.72 mg/dL (ref 0.20–1.20)
BUN: 23 mg/dL (ref 7.0–26.0)
CALCIUM: 9.3 mg/dL (ref 8.4–10.4)
CO2: 26 mEq/L (ref 22–29)
CREATININE: 1.1 mg/dL (ref 0.7–1.3)
Chloride: 107 mEq/L (ref 98–109)
EGFR: 61 mL/min/{1.73_m2} — ABNORMAL LOW (ref >90)
Glucose: 122 mg/dl (ref 70–140)
Potassium: 4.3 mEq/L (ref 3.5–5.1)
Sodium: 140 mEq/L (ref 136–145)
Total Protein: 6.1 g/dL — ABNORMAL LOW (ref 6.4–8.3)

## 2017-01-22 LAB — CEA (IN HOUSE-CHCC): CEA (CHCC-IN HOUSE): 4.74 ng/mL (ref 0.00–5.00)

## 2017-01-22 NOTE — Progress Notes (Signed)
Marland Kitchen    HEMATOLOGY/ONCOLOGY CLINIC NOTE  Date of Service: .01/22/2017  Patient Care Team: [name], MD as PCP - General (Family Medicine) Thompson Grayer, MD as Attending Physician (Cardiology) Donita Brooks, MD as Consulting Physician (Internal Medicine) [name], MD as Attending Physician (Family Medicine)  CHIEF COMPLAINTS/PURPOSE OF CONSULTATION:  Newly diagnosed cholangiocarcinoma  HISTORY OF PRESENTING ILLNESS:   Plz see previous note for details on initial presentation  INTERVAL HISTORY  Patient is here for a follow-up for his cholangiocarcinoma after having recently completing SBRT by Dr. Lisbeth Renshaw. Minimal nausea that has now resolved . Eating well. No change in bowel habits. CBC and CMP stable. No abdominal discomfort. Appears to be in good spirits.  MEDICAL HISTORY:  Past Medical History:  Diagnosis Date  . A-fib [facility]) May 2013   s/p TEE/cardioversion June 2013  . Anxiety   . Aortic root enlargement (HCC)    at least 5cm by CT  . Arthritis   . Atrial thrombus   . Bradycardia   . Bronchitis   . Cancer ([place]) 2016   lymphoma  . Chronic anticoagulation    on coumadin - checked in [place]  . Chronic systolic heart failure ([place]) 12/28/2011  . Congestive heart failure (CHF) ([place]) 2013  . Diverticulosis   . Dysrhythmia    AFib  . GERD (gastroesophageal reflux disease)   . Hemorrhoids   . HTN (hypertension)   . Pneumonia May 2013  . Pneumonia 2013  . 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 Thrombocytopenia ([place])     SURGICAL HISTORY: Past Surgical History:  Procedure Laterality Date  . CARDIOVERSION  12/30/2011   Procedure: CARDIOVERSION;  Surgeon: Lelon Perla, MD;  Location: [facility] ENDOSCOPY;  Service: Cardiovascular;  Laterality: N/A;  . CARDIOVERSION  02/16/2012   Procedure: CARDIOVERSION;  Surgeon:  Lelon Perla, MD;  Location: [facility] ENDOSCOPY;  Service: Cardiovascular;  Laterality: N/A;  . CATARACT EXTRACTION W/PHACO Left 07/28/2016   Procedure: CATARACT EXTRACTION PHACO AND INTRAOCULAR LENS PLACEMENT LEFT EYE:  CDE: 13.00;  Surgeon: Tonny Branch, MD;  Location: [facility] ORS;  Service: Ophthalmology;  Laterality: Left;  . ENDOSCOPIC RETROGRADE CHOLANGIOPANCREATOGRAPHY (ERCP) WITH PROPOFOL N/A 10/24/2016   Procedure: ENDOSCOPIC RETROGRADE CHOLANGIOPANCREATOGRAPHY (ERCP) WITH PROPOFOL;  Surgeon: Doran Stabler, MD;  Location: [facility] ENDOSCOPY;  Service: Endoscopy;  Laterality: N/A;  . ESOPHAGOGASTRODUODENOSCOPY  12/30/2011   Procedure: ESOPHAGOGASTRODUODENOSCOPY (EGD);  Surgeon: Lelon Perla, MD;  Location: [facility] ENDOSCOPY;  Service: Cardiovascular;  Laterality: N/A;  . ESOPHAGOGASTRODUODENOSCOPY  12/30/2011   Procedure: ESOPHAGOGASTRODUODENOSCOPY (EGD);  Surgeon: Lafayette Dragon, MD;  Location: [facility] ENDOSCOPY;  Service: Endoscopy;  Laterality: N/A;  . IR BILIARY STENT(S) EXISTING ACCESS INC DILATION CATH EXCHANGE  12/16/2016  . IR GENERIC HISTORICAL  10/25/2016   IR INT EXT BILIARY DRAIN WITH CHOLANGIOGRAM 10/25/2016 Greggory Keen, MD [facility]-INTERV RAD  . IR GENERIC HISTORICAL  10/28/2016   IR EXCHANGE BILIARY DRAIN 10/28/2016 Greggory Keen, MD [facility]INTER[facility]  . IR GENERIC HISTORICAL  10/28/2016   IR ENDOLUMINAL BX OF BILIARY TREE 10/28/2016 Greggory Keen, MD [facility]INTER[facility]  . IR GENERIC HISTORICAL  11/21/2016   IR EXCHANGE BILIARY DRAIN 11/21/2016 Corrie Mckusick, DO [facility]-INTERV RAD  . IR REMOVAL BILIARY DRAIN  12/30/2016  . KNEE ARTHROSCOPY  rt knee  . PROSTATE SURGERY    . TEE WITHOUT CARDIOVERSION  12/30/2011   Procedure: TRANSESOPHAGEAL ECHOCARDIOGRAM (TEE);  Surgeon: Lelon Perla, MD;  Location: [facility] ENDOSCOPY;  Service: Cardiovascular;  Laterality: N/A;  . TEE WITHOUT CARDIOVERSION  02/16/2012   Procedure: TRANSESOPHAGEAL ECHOCARDIOGRAM (TEE);  Surgeon: Lelon Perla, MD;  Location: [facility] ENDOSCOPY;  Service:  Cardiovascular;  Laterality: N/A;    SOCIAL HISTORY: Social History   Social History  . Marital status: Married    Spouse name: N/A  . Number of children: 2  . Years of education: N/A   Occupational History  . retired Retired   Social History Main Topics  . Smoking status: Never Smoker  . Smokeless tobacco: Never Used  . Alcohol use No  . Drug use: No  . Sexual activity: Not Currently    Birth control/ protection: None   Other Topics Concern  . Not on file   Social History Narrative  . No narrative on file    FAMILY HISTORY: Family History  Problem Relation Age of Onset  . Heart disease Mother   . Heart disease Father   . Stomach cancer Brother   . Heart disease Sister   . Heart disease Brother   . Hypertension Sister     ALLERGIES:  is allergic to codeine; simvastatin; and vicodin [hydrocodone-acetaminophen].  MEDICATIONS:  Current Outpatient Prescriptions  Medication Sig Dispense Refill  . acetaminophen (TYLENOL) 325 MG tablet Take 325 mg by mouth every 6 (six) hours as needed.    Marland Kitchen albuterol (PROVENTIL HFA;VENTOLIN HFA) 108 (90 BASE) MCG/ACT inhaler Inhale 2 puffs into the lungs every 6 (six) hours as needed for wheezing or shortness of breath.    . clorazepate (TRANXENE) 3.75 MG tablet Give .05 tablet by mouth daily as needed for anxiety    . fluticasone (FLONASE) 50 MCG/ACT nasal spray Place 1 spray into both nostrils daily as needed for allergies or rhinitis.    . folic acid (FOLVITE) 323 MCG tablet Take 800 mcg by mouth daily.    . furosemide (LASIX) 40 MG tablet TAKE ONE TABLET BY MOUTH ONCE DAILY 90 tablet 2  . nitroGLYCERIN (NITROSTAT) 0.4 MG SL tablet Place 1 tablet (0.4 mg total) under the tongue every 5 (five) minutes x 3 doses as needed for chest pain. 25 tablet 6  . omeprazole (PRILOSEC) 20 MG capsule Take 20 mg by mouth daily before breakfast.     . polyethylene glycol (MIRALAX / GLYCOLAX) packet Take 17 g by mouth at bedtime as needed.     .  potassium chloride (K-DUR) 10 MEQ tablet Take 1 tablet (10 mEq total) by mouth daily. 30 tablet 3   No current facility-administered medications for this visit.     REVIEW OF SYSTEMS:    10 Point review of Systems was done is negative except as noted above.  PHYSICAL EXAMINATION: ECOG PERFORMANCE STATUS: 2 - Symptomatic, <50% confined to bed  . Vitals:   01/22/17 0954  BP: 124/74  Pulse: 74  Resp: 18  Temp: 97.7 F (36.5 C)   Filed Weights   01/22/17 0954  Weight: 202 lb 1.6 oz (91.7 kg)   .Body mass index is 29 kg/m.  GENERAL:alert, in no acute distress and comfortable SKIN: no acute rashes, no significant lesions EYES: conjunctiva are pink and non-injected, sclera anicteric OROPHARYNX: MMM, no exudates, no oropharyngeal erythema or ulceration NECK: supple, no JVD LYMPH:  no palpable lymphadenopathy in the cervical, axillary or inguinal regions LUNGS: clear to auscultation b/l with normal respiratory effort HEART: regular rate & rhythm ABDOMEN:  normoactive bowel sounds , non tender, not distended. Extremity: no pedal edema PSYCH: alert & oriented x 3 with fluent speech NEURO: no focal motor/sensory deficits  LABORATORY DATA:  I have reviewed the data as listed  . CBC Latest Ref Rng & Units 01/22/2017 12/16/2016 12/11/2016  WBC 4.0 - 10.3 10e3/uL 3.8(L) 5.7 5.3  Hemoglobin 13.0 - 17.1 g/dL 12.2(L) 11.6(L) 11.6(L)  Hematocrit 38.4 - 49.9 % 37.6(L) 35.1(L) 35.8(L)  Platelets 140 - 400 10e3/uL 97(L) 121(L) 122(L)   . CMP Latest Ref Rng & Units 01/22/2017 01/13/2017 12/23/2016  Glucose 70 - 140 mg/dl 122 89 108(H)  BUN 7.0 - 26.0 mg/dL 23.0 29(H) 25  Creatinine 0.7 - 1.3 mg/dL 1.1 1.10 1.16  Sodium 136 - 145 mEq/L 140 142 144  Potassium 3.5 - 5.1 mEq/L 4.3 4.6 4.3  Chloride 96 - 106 mmol/L - 101 103  CO2 22 - 29 mEq/L 26 24 25   Calcium 8.4 - 10.4 mg/dL 9.3 8.9 8.8  Total Protein 6.4 - 8.3 g/dL 6.1(L) - -  Total Bilirubin 0.20 - 1.20 mg/dL 0.72 - -  Alkaline Phos  40 - 150 U/L 133 - -  AST 5 - 34 U/L 32 - -  ALT 0 - 55 U/L 33 - -     CT chest wo contrast 10/31/2016 IMPRESSION: 1. Small 7 mm subcutaneous nodule in the anterior chest wall, nonspecific, but new from chest CT January 2017. This may be a subcutaneous lymph node or metastatic deposit, versus incidental soft tissue nodule. 2. There is otherwise no evidence of metastatic disease in the thorax. 3. Stable aneurysmal dilatation of the aortic root and ascending aorta. 4. Unchanged 3 mm left lower lobe pulmonary nodule dating back to 2013, benign. 5. Stable CT appearance of left thyroid nodule.   Electronically Signed   By: Jeb Levering M.D.   On: 10/31/2016 19:41   CT abd and pelvis with contrast 10/22/2016 IMPRESSION: 1. Moderate intrahepatic biliary ductal dilatation, mild distention of the gallbladder, a distention of the common bile duct up to 19 mm. The common bile duct is abruptly cut off as it enters the head of the pancreas with soft tissue filling the lumen. 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 MRI/MRCP is recommended with and without intravenous contrast for further evaluation. 2. Moderate cardiomegaly. 3. Cholelithiasis. 4. Aortic atherosclerosis. 5. Prostate hypertrophy.   Electronically Signed   By: Kristine Garbe M.D.   On: 10/22/2016 22:29   RADIOGRAPHIC STUDIES: I have personally reviewed the radiological images as listed and agreed with the findings in the report. ASSESSMENT & PLAN:   83 year old male with   #1 Newly Diagnosed Extrahepatic Cholangiocarcinoma causing biliary obstruction and possible cholangitis. Patient is status post internal/external drain placement. Cholangiocarcinoma more likely since the obstruction was in the mid CBD and there was no overt pancreatic lesion noted. Elevated CA-19-9 levels can occur in both cholangiocarcinoma and pancreatic cancers #2 abnormal liver  function tests likely related to mid CBD malignant obstruction from newly diagnosed cholangiocarcinoma.  Status post antibiotics for treatment of possible cholangitis. LFTs are gradually improving. CT scan of the abdomen and pelvis and CT chest did not show evidence of overt metastatic disease.  Plan -Patient has now completed SBRT for  is extrahepatic cholangiocarcinoma as per Dr Lisbeth Renshaw. -He seems to have tolerated the treatment without significant toxicities. -CBC CMP with liver function tests are stable today. -No prohibitive toxicities at this time. -We shall see him back in  about 6-7 weeks with CT of the chest abdomen pelvis and labs to reevaluate his cholangiocarcinoma status to determine need for further treatment. We'll follow up his pending tumor markers from today.  #2 h/o Waldenstrom's macroglobulinemia/LPL -- s/p treatment at [facility] in [place] Dr Elyn Aquas in 2016  Plan No indication for additional treatment of the patients WM at this time. -rpt myeloma panel and SFLC on his next visit in 6-8 weeks   #3.Marland Kitchen Patient Active Problem List   Diagnosis Date Noted  . Cholangitis   . Cholangiocarcinoma ([place])   . Obstructive jaundice   . Hyperbilirubinemia   . Common bile duct stricture   . Jaundice 10/22/2016  . Supratherapeutic INR 10/22/2016  . GERD (gastroesophageal reflux disease) 10/22/2016  . Anxiety 10/22/2016  . Cough 10/09/2016  . Thyroid goiter 02/26/2012  . Aortic root enlargement ([place]) 01/12/2012  . Long term (current) use of anticoagulants 01/06/2012  . Thrombus of left atrial appendage 01/06/2012  . Acute Systolic dysfunction/cardiomyopathy due to prolonged tachycardia/EF 25% 12/30/2011  . Thrombocytopenia ([place]) 12/30/2011  . Other dysphagia 12/30/2011  . Atrial fibrillation 12/28/2011  . HTN (hypertension), benign 12/28/2011    - will need to continue f/u with PCP and cardiologist on discharge.  CT chest/abd/pelvis in 6 weeks RTC with Dr Irene Limbo with labs and  CT in 6 weeks  All of the patients questions were answered with apparent satisfaction. The patient knows to call the clinic with any problems, questions or concerns.  I spent 20 minutes counseling the patient face to face. The total time spent in the appointment was 25 minutes and more than 50% was on counseling and direct patient cares.    Sullivan Lone MD [place] AAHIVMS [facility] Hematology/Oncology Physician [facility]  (Office):       [phone] (Work cell):  [phone] (Fax):           [phone]  .01/22/2017

## 2017-01-22 NOTE — Patient Instructions (Signed)
Thank you for choosing [facility] to provide your oncology and hematology care.  To afford each patient quality time with our providers, please arrive 30 minutes before your scheduled appointment time.  If you arrive late for your appointment, you may be asked to reschedule.  We strive to give you quality time with our providers, and arriving late affects you and other patients whose appointments are after yours.   If you are a no show for multiple scheduled visits, you may be dismissed from the clinic at the providers discretion.    Again, thank you for choosing [facility], our hope is that these requests will decrease the amount of time that you wait before being seen by our physicians.  ______________________________________________________________________  Should you have questions after your visit to the [facility], please contact our office at [phone] between the hours of 8:30 and 4:30 p.m.    Voicemails left after 4:30p.m will not be returned until the following business day.    For prescription refill requests, please have your pharmacy contact us directly.  Please also try to allow 48 hours for prescription requests.    Please contact the scheduling department for questions regarding scheduling.  For scheduling of procedures such as PET scans, CT scans, MRI, Ultrasound, etc please contact central scheduling at [phone].    Resources For Cancer Patients and Caregivers:   Oncolink.org:  A wonderful resource for patients and healthcare providers for information regarding your disease, ways to tract your treatment, what to expect, etc.     American Cancer Society:  800-227-2345  Can help patients locate various types of support and financial assistance  Cancer Care: 1-800-813-HOPE (4673) Provides financial assistance, online support groups, medication/co-pay assistance.    [place] DSS:  336-641-3447 Where to apply for food  stamps, Medicaid, and utility assistance  Medicare Rights Center: 800-333-4114 Helps people with Medicare understand their rights and benefits, navigate the Medicare system, and secure the quality healthcare they deserve  SCAT: 336-333-6589 [place] [employer]'s shared-ride transportation service for eligible riders who have a disability that prevents them from riding the fixed route bus.    For additional information on assistance programs please contact our social worker:   Grier Hock/Abigail Elmore:  [phone]            

## 2017-01-22 NOTE — Telephone Encounter (Signed)
Scheduled apt per 5/24 LOS. Central radiology to contact appt per Ct . Scheduled f/u for GK in 8 weeks due to [place] PAL .

## 2017-01-23 LAB — CANCER ANTIGEN 19-9: CA 19-9: 203 U/mL — ABNORMAL HIGH (ref 0–35)

## 2017-01-27 ENCOUNTER — Encounter: Payer: Self-pay | Admitting: Family Medicine

## 2017-01-27 ENCOUNTER — Ambulatory Visit (INDEPENDENT_AMBULATORY_CARE_PROVIDER_SITE_OTHER): Payer: PPO | Admitting: Family Medicine

## 2017-01-27 VITALS — BP 128/73 | HR 77 | Temp 97.1°F | Ht 70.0 in | Wt 202.6 lb

## 2017-01-27 DIAGNOSIS — I5020 Unspecified systolic (congestive) heart failure: Secondary | ICD-10-CM | POA: Diagnosis not present

## 2017-01-27 DIAGNOSIS — G479 Sleep disorder, unspecified: Secondary | ICD-10-CM | POA: Diagnosis not present

## 2017-01-27 DIAGNOSIS — C221 Intrahepatic bile duct carcinoma: Secondary | ICD-10-CM

## 2017-01-27 MED ORDER — TRAZODONE HCL 50 MG PO TABS: 50.0000 mg | ORAL_TABLET | Freq: Every evening | ORAL | 3 refills | Status: DC | PRN

## 2017-01-27 NOTE — Progress Notes (Signed)
   HPI  Patient presents today here to follow-up for chronic medical condition.  Patient has recent diagnosis of cholangiocarcinoma now status post radiation therapy. He also has a stent in his in place in the common bile duct is doing well.  He states that he's tolerating food and fluids normally. He is getting very good diuresis with Lasix, currently taking 1-1/2 tablets daily. At times he states he has difficult time sleeping, however he does not want anything addictive.  He is breathing easily. He denies chest pain. He is tolerating food and fluids normally.   PMH: Smoking status noted ROS: Per HPI  Objective: BP 128/73   Pulse 77   Temp 97.1 F (36.2 C) (Oral)   Ht 5\' 10"  (1.778 m)   Wt 202 lb 9.6 oz (91.9 kg)   BMI 29.07 kg/m  Gen: NAD, alert, cooperative with exam HEENT: NCAT CV: RRR Resp: CTABL, no wheezes, non-labored Ext: No edema, warm Neuro: Alert and oriented, No gross deficits  Assessment and plan:  # trouble sleeping - new problem Discussed melatonin, he's tried this, will try trazodone only as needed. Patient is hesitant to try this.   # Systolic CHF Previously reduced EF, improved with medical therapy. Appears euvolemic, denies dyspnea. Continue current dose of Lasix, 60 mg daily- this is giving him good diuresis.  Cholangiocarcinoma S/p radition therapy Reviewed recent treatments and findings. Patient scheduled for repeat CT scan and labs. Patient is happy with his treatment overall, he is anticipating the upcoming CT scan.  Meds ordered this encounter  Medications  . traZODone (DESYREL) 50 MG tablet    Sig: Take 1-2 tablets (50-100 mg total) by mouth at bedtime as needed for sleep.    Dispense:  30 tablet    Refill:  [place], MD [place] 01/27/2017, 1:33 PM

## 2017-01-27 NOTE — Addendum Note (Signed)
Encounter addended by: Kyung Rudd, MD on: 01/27/2017  8:42 AM<BR>    Actions taken: Sign clinical note

## 2017-01-27 NOTE — Progress Notes (Signed)
  Radiation Oncology         [phone] ________________________________  Name: Jimmy Myers MRN: [id number]  Date: 12/17/2016  DOB: [birth year]  RESPIRATORY MOTION MANAGEMENT SIMULATION  NARRATIVE:  In order to account for effect of respiratory motion on target structures and other organs in the planning and delivery of radiotherapy, this patient underwent respiratory motion management simulation.  To accomplish this, when the patient was brought to the CT simulation planning suite, 4D respiratoy motion management CT images were obtained.  The CT images were loaded into the planning software.  Then, using a variety of tools including Cine, MIP, and standard views, the target volume and planning target volumes (PTV) were delineated.  Avoidance structures were contoured.  Treatment planning then occurred.  Dose volume histograms were generated and reviewed for each of the requested structure.  The resulting plan was carefully reviewed and approved today.   ------------------------------------------------  Jodelle Gross, MD, PhD

## 2017-01-27 NOTE — Patient Instructions (Signed)
Great to see you!  Come back in 3 months unless you need us sooner.    

## 2017-01-29 ENCOUNTER — Ambulatory Visit: Payer: PPO | Admitting: Family Medicine

## 2017-01-30 DIAGNOSIS — I2699 Other pulmonary embolism without acute cor pulmonale: Secondary | ICD-10-CM

## 2017-01-30 HISTORY — DX: Other pulmonary embolism without acute cor pulmonale: I26.99

## 2017-02-13 ENCOUNTER — Encounter: Payer: Self-pay | Admitting: Interventional Radiology

## 2017-02-16 ENCOUNTER — Encounter: Payer: Self-pay | Admitting: General Surgery

## 2017-02-17 ENCOUNTER — Emergency Department (HOSPITAL_COMMUNITY): Payer: PPO

## 2017-02-17 ENCOUNTER — Encounter (HOSPITAL_COMMUNITY): Payer: Self-pay | Admitting: Emergency Medicine

## 2017-02-17 ENCOUNTER — Telehealth: Payer: Self-pay | Admitting: Nurse Practitioner

## 2017-02-17 ENCOUNTER — Ambulatory Visit (INDEPENDENT_AMBULATORY_CARE_PROVIDER_SITE_OTHER): Payer: PPO | Admitting: Family

## 2017-02-17 ENCOUNTER — Encounter: Payer: Self-pay | Admitting: Family

## 2017-02-17 ENCOUNTER — Inpatient Hospital Stay (HOSPITAL_COMMUNITY)
Admission: EM | Admit: 2017-02-17 | Discharge: 2017-02-23 | DRG: 175 | Disposition: A | Discharge status: Home / Self Care | Payer: PPO | Attending: Internal Medicine | Admitting: Internal Medicine

## 2017-02-17 VITALS — BP 119/66 | HR 138 | Temp 97.3°F | Wt 203.8 lb

## 2017-02-17 DIAGNOSIS — I5020 Unspecified systolic (congestive) heart failure: Secondary | ICD-10-CM | POA: Diagnosis not present

## 2017-02-17 DIAGNOSIS — I5042 Chronic combined systolic (congestive) and diastolic (congestive) heart failure: Secondary | ICD-10-CM | POA: Diagnosis not present

## 2017-02-17 DIAGNOSIS — Z888 Allergy status to other drugs, medicaments and biological substances status: Secondary | ICD-10-CM

## 2017-02-17 DIAGNOSIS — I482 Chronic atrial fibrillation: Secondary | ICD-10-CM | POA: Diagnosis not present

## 2017-02-17 DIAGNOSIS — D649 Anemia, unspecified: Secondary | ICD-10-CM | POA: Diagnosis present

## 2017-02-17 DIAGNOSIS — Z79899 Other long term (current) drug therapy: Secondary | ICD-10-CM | POA: Diagnosis not present

## 2017-02-17 DIAGNOSIS — J9600 Acute respiratory failure, unspecified whether with hypoxia or hypercapnia: Secondary | ICD-10-CM | POA: Diagnosis not present

## 2017-02-17 DIAGNOSIS — I2699 Other pulmonary embolism without acute cor pulmonale: Secondary | ICD-10-CM | POA: Diagnosis not present

## 2017-02-17 DIAGNOSIS — F419 Anxiety disorder, unspecified: Secondary | ICD-10-CM

## 2017-02-17 DIAGNOSIS — E876 Hypokalemia: Secondary | ICD-10-CM | POA: Diagnosis not present

## 2017-02-17 DIAGNOSIS — I10 Essential (primary) hypertension: Secondary | ICD-10-CM | POA: Diagnosis not present

## 2017-02-17 DIAGNOSIS — M79609 Pain in unspecified limb: Secondary | ICD-10-CM | POA: Diagnosis not present

## 2017-02-17 DIAGNOSIS — I712 Thoracic aortic aneurysm, without rupture: Secondary | ICD-10-CM | POA: Diagnosis present

## 2017-02-17 DIAGNOSIS — D696 Thrombocytopenia, unspecified: Secondary | ICD-10-CM | POA: Diagnosis not present

## 2017-02-17 DIAGNOSIS — I480 Paroxysmal atrial fibrillation: Secondary | ICD-10-CM | POA: Diagnosis not present

## 2017-02-17 DIAGNOSIS — R0602 Shortness of breath: Secondary | ICD-10-CM | POA: Diagnosis not present

## 2017-02-17 DIAGNOSIS — Z7901 Long term (current) use of anticoagulants: Secondary | ICD-10-CM | POA: Diagnosis not present

## 2017-02-17 DIAGNOSIS — I4891 Unspecified atrial fibrillation: Secondary | ICD-10-CM | POA: Diagnosis not present

## 2017-02-17 DIAGNOSIS — I130 Hypertensive heart and chronic kidney disease with heart failure and stage 1 through stage 4 chronic kidney disease, or unspecified chronic kidney disease: Secondary | ICD-10-CM | POA: Diagnosis not present

## 2017-02-17 DIAGNOSIS — Z885 Allergy status to narcotic agent status: Secondary | ICD-10-CM | POA: Diagnosis not present

## 2017-02-17 DIAGNOSIS — R079 Chest pain, unspecified: Secondary | ICD-10-CM | POA: Diagnosis not present

## 2017-02-17 DIAGNOSIS — I509 Heart failure, unspecified: Secondary | ICD-10-CM | POA: Diagnosis not present

## 2017-02-17 DIAGNOSIS — N182 Chronic kidney disease, stage 2 (mild): Secondary | ICD-10-CM | POA: Diagnosis not present

## 2017-02-17 DIAGNOSIS — I519 Heart disease, unspecified: Secondary | ICD-10-CM | POA: Diagnosis not present

## 2017-02-17 DIAGNOSIS — I481 Persistent atrial fibrillation: Secondary | ICD-10-CM | POA: Diagnosis not present

## 2017-02-17 DIAGNOSIS — Z09 Encounter for follow-up examination after completed treatment for conditions other than malignant neoplasm: Secondary | ICD-10-CM

## 2017-02-17 DIAGNOSIS — I5043 Acute on chronic combined systolic (congestive) and diastolic (congestive) heart failure: Secondary | ICD-10-CM | POA: Diagnosis not present

## 2017-02-17 DIAGNOSIS — R000 Tachycardia, unspecified: Secondary | ICD-10-CM | POA: Diagnosis not present

## 2017-02-17 DIAGNOSIS — J9601 Acute respiratory failure with hypoxia: Secondary | ICD-10-CM | POA: Diagnosis present

## 2017-02-17 DIAGNOSIS — I5041 Acute combined systolic (congestive) and diastolic (congestive) heart failure: Secondary | ICD-10-CM | POA: Diagnosis not present

## 2017-02-17 DIAGNOSIS — C221 Intrahepatic bile duct carcinoma: Secondary | ICD-10-CM | POA: Diagnosis not present

## 2017-02-17 DIAGNOSIS — M7989 Other specified soft tissue disorders: Secondary | ICD-10-CM | POA: Diagnosis not present

## 2017-02-17 HISTORY — DX: Other pulmonary embolism without acute cor pulmonale: I26.99

## 2017-02-17 LAB — BASIC METABOLIC PANEL
Anion gap: 7 (ref 5–15)
BUN: 25 mg/dL — ABNORMAL HIGH (ref 6–20)
CALCIUM: 9.1 mg/dL (ref 8.9–10.3)
CO2: 27 mmol/L (ref 22–32)
CREATININE: 1.36 mg/dL — AB (ref 0.61–1.24)
Chloride: 105 mmol/L (ref 101–111)
GFR calc Af Amer: 54 mL/min — ABNORMAL LOW (ref >60)
GFR calc non Af Amer: 47 mL/min — ABNORMAL LOW (ref >60)
Glucose, Bld: 83 mg/dL (ref 65–99)
Potassium: 4.6 mmol/L (ref 3.5–5.1)
Sodium: 139 mmol/L (ref 135–145)

## 2017-02-17 LAB — CBC
HCT: 40.7 % (ref 39.0–52.0)
Hemoglobin: 13 g/dL (ref 13.0–17.0)
MCH: 31.4 pg (ref 26.0–34.0)
MCHC: 31.9 g/dL (ref 30.0–36.0)
MCV: 98.3 fL (ref 78.0–100.0)
PLATELETS: 112 10*3/uL — AB (ref 150–400)
RBC: 4.14 MIL/uL — ABNORMAL LOW (ref 4.22–5.81)
RDW: 13.7 % (ref 11.5–15.5)
WBC: 7.6 10*3/uL (ref 4.0–10.5)

## 2017-02-17 LAB — T4, FREE: FREE T4: 1.12 ng/dL (ref 0.61–1.12)

## 2017-02-17 LAB — TSH: TSH: 0.904 u[IU]/mL (ref 0.350–4.500)

## 2017-02-17 LAB — TROPONIN I: TROPONIN I: 0.04 ng/mL — AB (ref <0.03)

## 2017-02-17 LAB — I-STAT TROPONIN, ED: TROPONIN I, POC: 0.02 ng/mL (ref 0.00–0.08)

## 2017-02-17 LAB — BRAIN NATRIURETIC PEPTIDE: B Natriuretic Peptide: 596.1 pg/mL — ABNORMAL HIGH (ref 0.0–100.0)

## 2017-02-17 LAB — MAGNESIUM: Magnesium: 2 mg/dL (ref 1.7–2.4)

## 2017-02-17 IMAGING — DX DG CHEST 2V
2 series · 2 of 2 positions shown · non-contrast
Comparison: Chest CT scan [DATE] and chest x-ray [DATE].

CLINICAL DATA: 3-4 days of shortness of breath. No chest pain.
History of CHF, atrial fibrillation.

EXAM:
CHEST  2 VIEW

[chest pa]
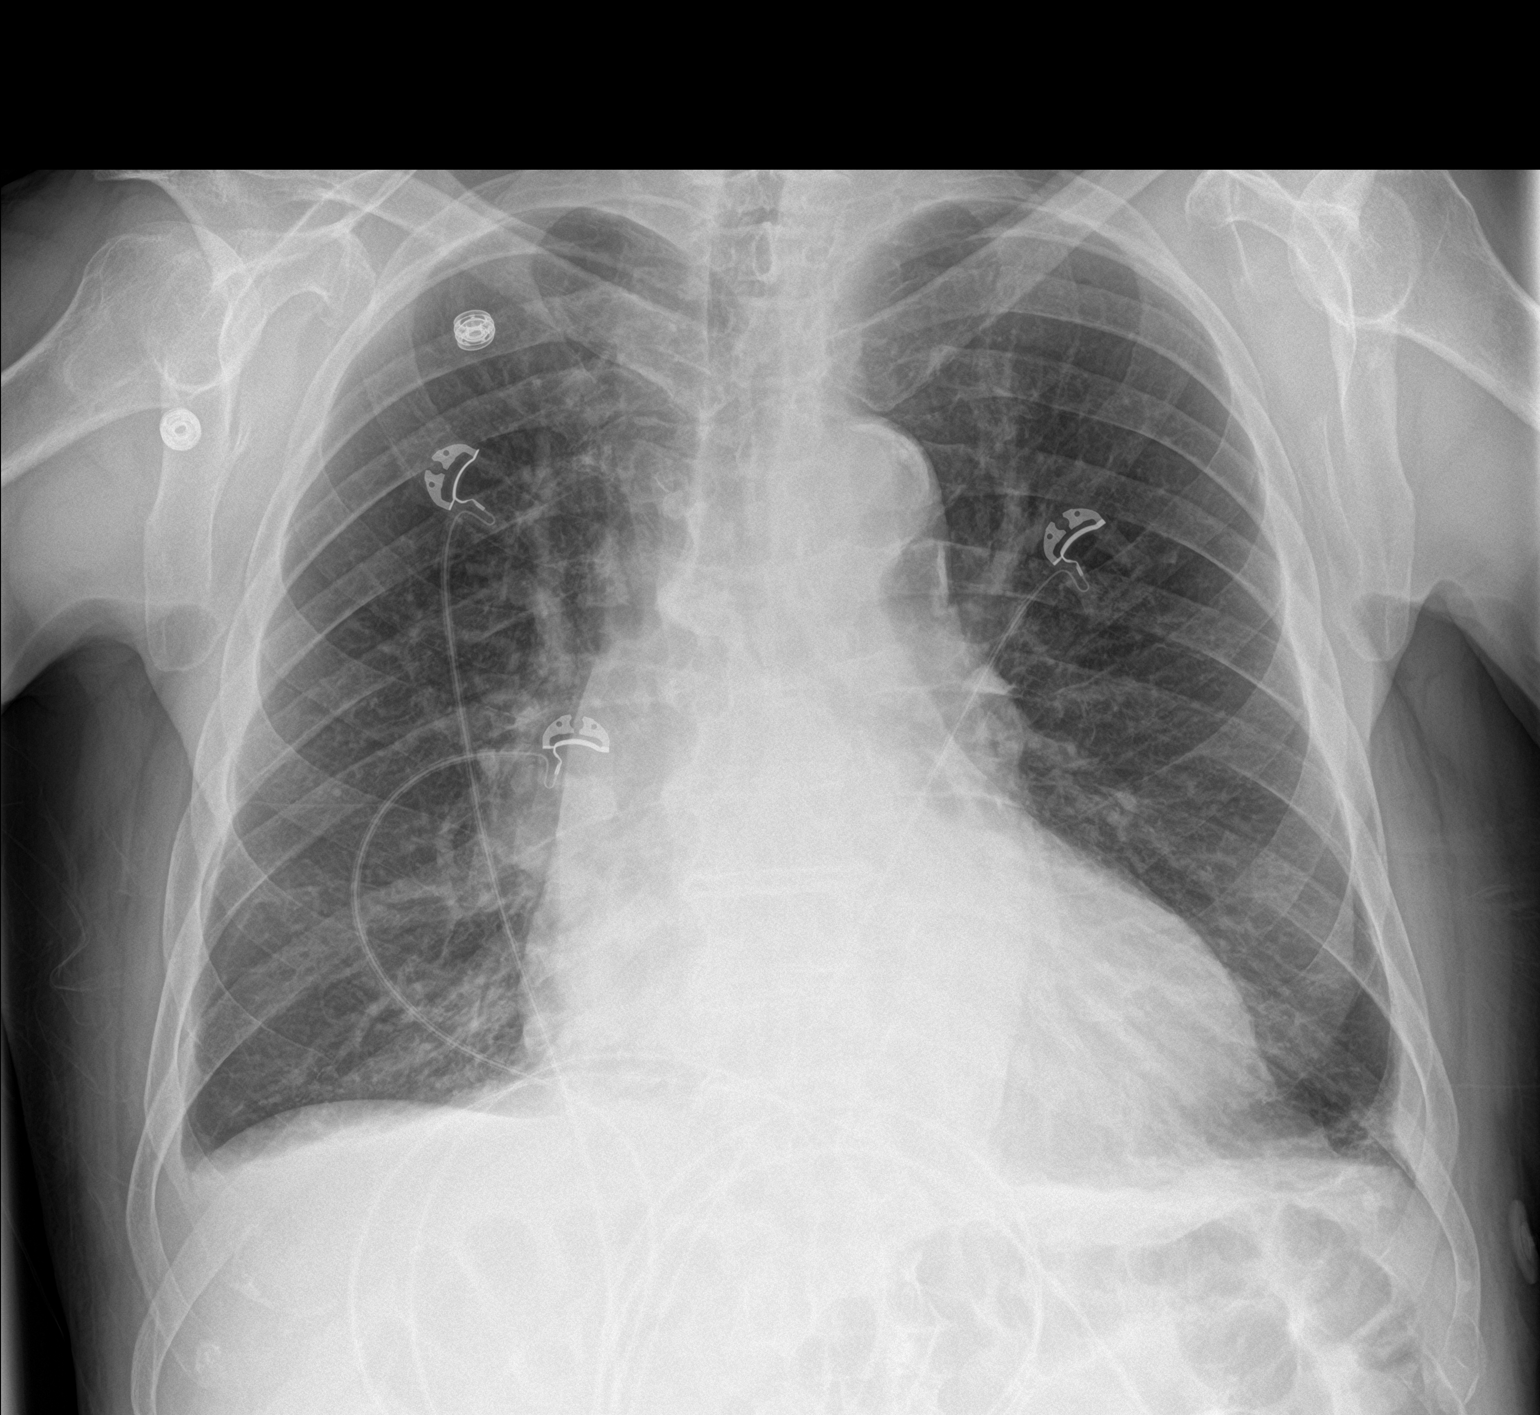

[chest lat]
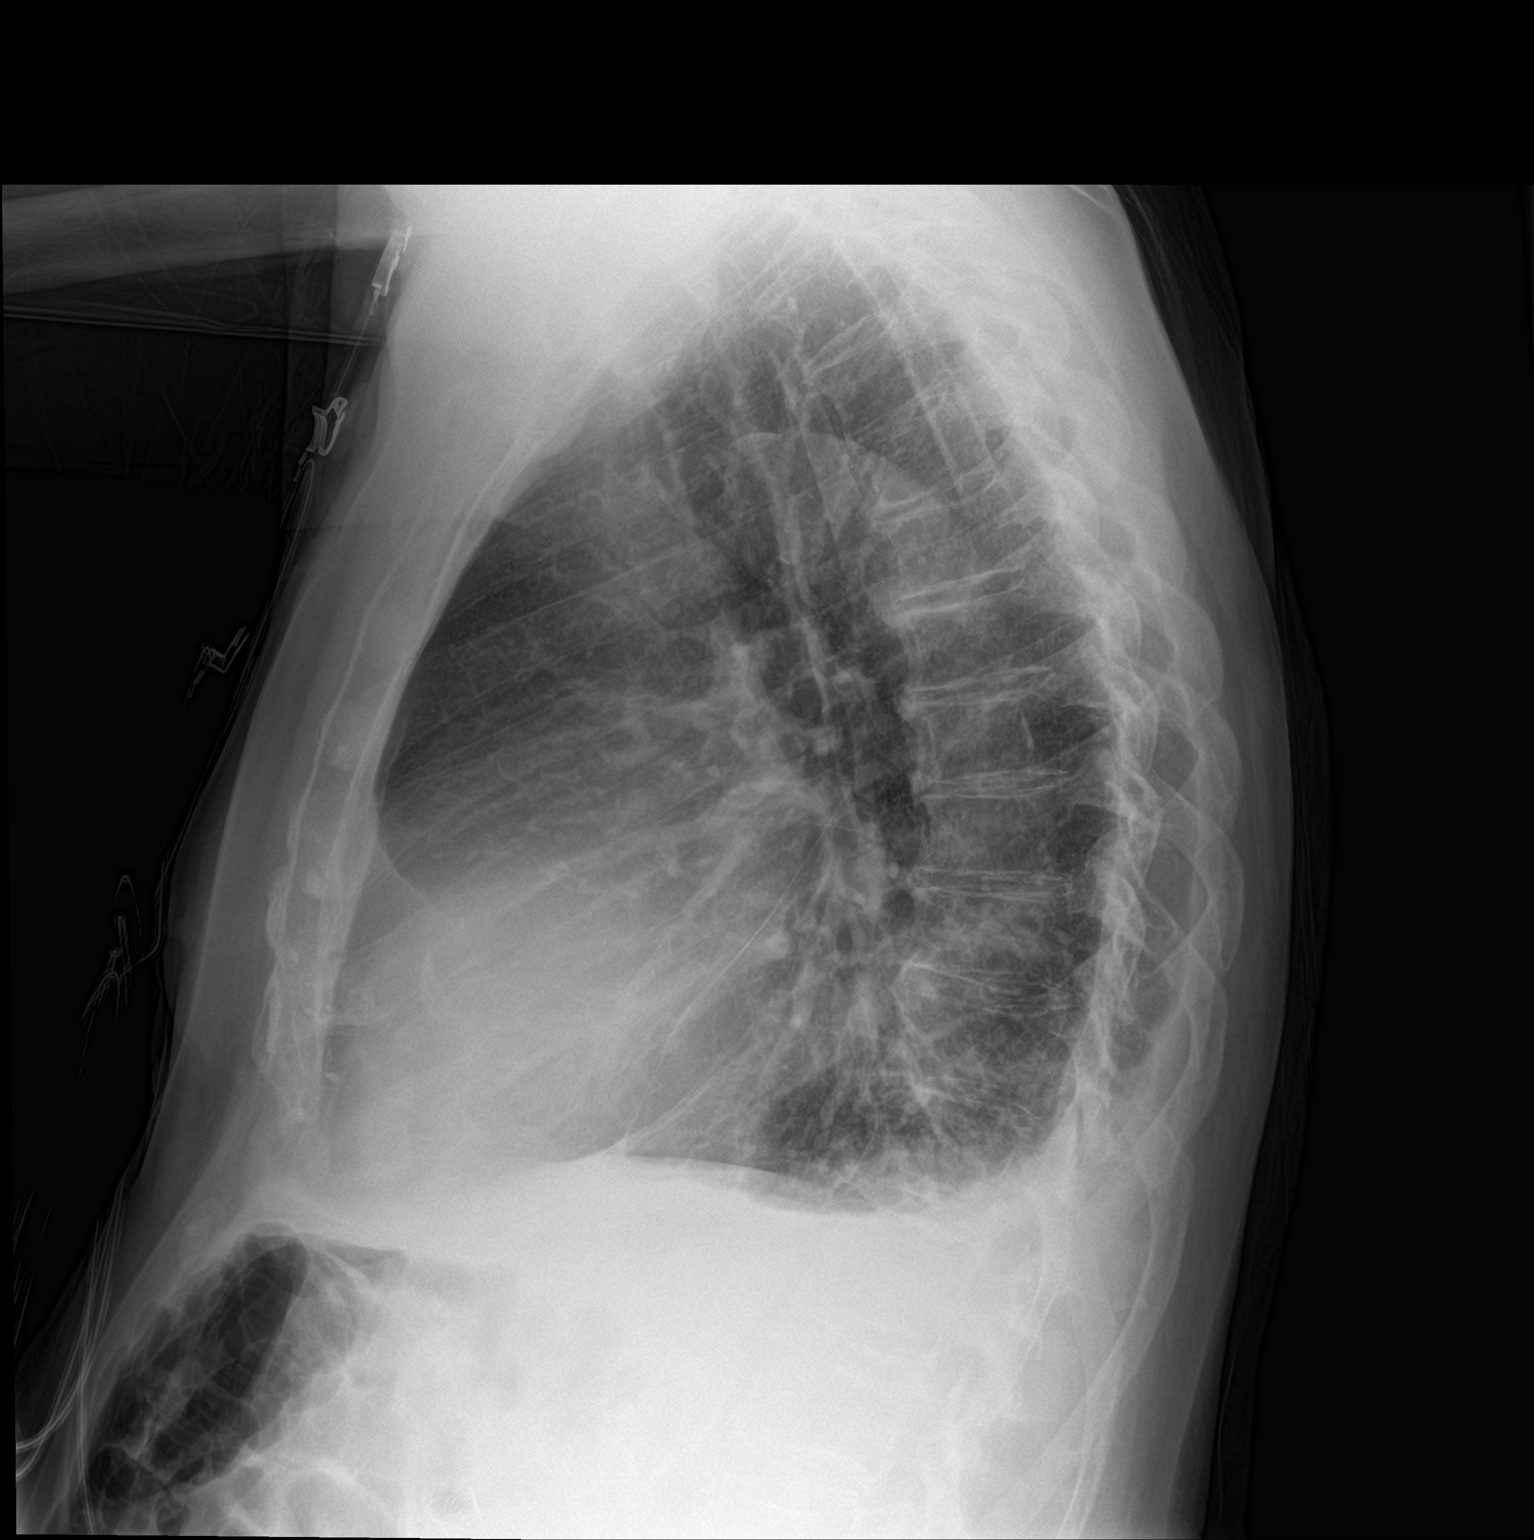

[2 of 2 positions shown; findings below may reference images not displayed]

FINDINGS: The lungs remain mildly hyperinflated. There are small bilateral
pleural effusions layering posteriorly which are slightly more
conspicuous than on the previous study. The cardiac silhouette is
enlarged. The pulmonary vascularity is engorged. There is
calcification in the wall of the thoracic aorta. The bony thorax
exhibits no acute abnormality.
IMPRESSION: Hyperinflation consistent with chronic bronchitis or reactive airway
disease, stable. Cardiomegaly, pulmonary vascular congestion, and
mild pulmonary interstitial edema consistent with CHF. Small
bilateral pleural effusions more conspicuous than on the previous
study.

Thoracic aortic atherosclerosis.

## 2017-02-17 MED ORDER — PROMETHAZINE HCL 25 MG PO TABS
12.5000 mg | ORAL_TABLET | Freq: Four times a day (QID) | ORAL | Status: DC | PRN
  Administered 2017-02-20: 12.5 mg via ORAL
  Filled 2017-02-17: qty 1

## 2017-02-17 MED ORDER — DEXTROSE 5 % IV SOLN
5.0000 mg/h | Freq: Once | INTRAVENOUS | Status: AC
  Administered 2017-02-17: 5 mg/h via INTRAVENOUS
  Filled 2017-02-17: qty 100

## 2017-02-17 MED ORDER — DILTIAZEM HCL 30 MG PO TABS
30.0000 mg | ORAL_TABLET | Freq: Four times a day (QID) | ORAL | Status: DC
  Administered 2017-02-17 – 2017-02-19 (×6): 30 mg via ORAL
  Filled 2017-02-17 (×9): qty 1

## 2017-02-17 MED ORDER — CLORAZEPATE DIPOTASSIUM 3.75 MG PO TABS
3.7500 mg | ORAL_TABLET | Freq: Two times a day (BID) | ORAL | Status: DC | PRN
  Administered 2017-02-21 – 2017-02-22 (×3): 3.75 mg via ORAL
  Filled 2017-02-17 (×3): qty 1

## 2017-02-17 MED ORDER — FLUTICASONE PROPIONATE 50 MCG/ACT NA SUSP
1.0000 | Freq: Every day | NASAL | Status: DC | PRN
  Filled 2017-02-17: qty 16

## 2017-02-17 MED ORDER — POLYETHYLENE GLYCOL 3350 17 G PO PACK
17.0000 g | PACK | Freq: Every day | ORAL | Status: DC
  Administered 2017-02-17 – 2017-02-22 (×6): 17 g via ORAL
  Filled 2017-02-17 (×6): qty 1

## 2017-02-17 MED ORDER — HEPARIN (PORCINE) IN NACL 100-0.45 UNIT/ML-% IJ SOLN
1500.0000 [IU]/h | INTRAMUSCULAR | Status: AC
  Administered 2017-02-17 – 2017-02-18 (×2): 1500 [IU]/h via INTRAVENOUS
  Filled 2017-02-17 (×2): qty 250

## 2017-02-17 MED ORDER — FUROSEMIDE 40 MG PO TABS
60.0000 mg | ORAL_TABLET | Freq: Every day | ORAL | Status: DC
  Administered 2017-02-18: 60 mg via ORAL
  Filled 2017-02-17: qty 1

## 2017-02-17 MED ORDER — ZOLPIDEM TARTRATE 5 MG PO TABS
5.0000 mg | ORAL_TABLET | Freq: Once | ORAL | Status: AC
  Administered 2017-02-17: 5 mg via ORAL
  Filled 2017-02-17: qty 1

## 2017-02-17 MED ORDER — ACETAMINOPHEN 650 MG RE SUPP: 650.0000 mg | Freq: Four times a day (QID) | RECTAL | Status: DC | PRN

## 2017-02-17 MED ORDER — SODIUM CHLORIDE 0.9 % IV BOLUS (SEPSIS)
250.0000 mL | Freq: Once | INTRAVENOUS | Status: AC
  Administered 2017-02-17: 250 mL via INTRAVENOUS

## 2017-02-17 MED ORDER — ALBUTEROL SULFATE HFA 108 (90 BASE) MCG/ACT IN AERS: 2.0000 | INHALATION_SPRAY | Freq: Four times a day (QID) | RESPIRATORY_TRACT | Status: DC | PRN

## 2017-02-17 MED ORDER — ACETAMINOPHEN 325 MG PO TABS
650.0000 mg | ORAL_TABLET | Freq: Four times a day (QID) | ORAL | Status: DC | PRN
  Filled 2017-02-17: qty 2

## 2017-02-17 MED ORDER — IOPAMIDOL (ISOVUE-370) INJECTION 76%
INTRAVENOUS | Status: AC
  Filled 2017-02-17: qty 100

## 2017-02-17 MED ORDER — PANTOPRAZOLE SODIUM 40 MG PO TBEC
40.0000 mg | DELAYED_RELEASE_TABLET | Freq: Every day | ORAL | Status: DC
  Administered 2017-02-18 – 2017-02-23 (×6): 40 mg via ORAL
  Filled 2017-02-17 (×6): qty 1

## 2017-02-17 MED ORDER — HEPARIN BOLUS VIA INFUSION
4500.0000 [IU] | Freq: Once | INTRAVENOUS | Status: AC
  Administered 2017-02-17: 4500 [IU] via INTRAVENOUS
  Filled 2017-02-17: qty 4500

## 2017-02-17 MED ORDER — ALBUTEROL SULFATE (2.5 MG/3ML) 0.083% IN NEBU
2.5000 mg | INHALATION_SOLUTION | Freq: Four times a day (QID) | RESPIRATORY_TRACT | Status: DC | PRN
Start: 2017-02-17 — End: 2017-02-23
  Administered 2017-02-21: 2.5 mg via RESPIRATORY_TRACT
  Filled 2017-02-17: qty 3

## 2017-02-17 MED ORDER — DILTIAZEM HCL ER 60 MG PO CP12
60.0000 mg | ORAL_CAPSULE | Freq: Two times a day (BID) | ORAL | Status: DC
  Administered 2017-02-18 – 2017-02-20 (×5): 60 mg via ORAL
  Filled 2017-02-17 (×6): qty 1

## 2017-02-17 MED ORDER — LORATADINE 10 MG PO TABS
10.0000 mg | ORAL_TABLET | Freq: Every day | ORAL | Status: DC
  Administered 2017-02-18 – 2017-02-23 (×5): 10 mg via ORAL
  Filled 2017-02-17 (×6): qty 1

## 2017-02-17 NOTE — Progress Notes (Signed)
Pt arrived to 2w from [facility] ED. Telemetry box applied and CCMD notified. Pt oriented to room and staff. Vitals obtained. Will continue current plan of care.  Grant Fontana BSN, RN

## 2017-02-17 NOTE — ED Notes (Addendum)
Pt sent from [place] primary care today for new onset atrial fibrillation found on EKG in their office this morning

## 2017-02-17 NOTE — Patient Instructions (Signed)
Atrial Fibrillation Atrial fibrillation is a type of irregular or rapid heartbeat (arrhythmia). In atrial fibrillation, the heart quivers continuously in a chaotic pattern. This occurs when parts of the heart receive disorganized signals that make the heart unable to pump blood normally. This can increase the risk for stroke, heart failure, and other heart-related conditions. There are different types of atrial fibrillation, including:  Paroxysmal atrial fibrillation. This type starts suddenly, and it usually stops on its own shortly after it starts.  Persistent atrial fibrillation. This type often lasts longer than a week. It may stop on its own or with treatment.  Long-lasting persistent atrial fibrillation. This type lasts longer than 12 months.  Permanent atrial fibrillation. This type does not go away.  Talk with your health care provider to learn about the type of atrial fibrillation that you have. What are the causes? This condition is caused by some heart-related conditions or procedures, including:  A heart attack.  Coronary artery disease.  Heart failure.  Heart valve conditions.  High blood pressure.  Inflammation of the sac that surrounds the heart (pericarditis).  Heart surgery.  Certain heart rhythm disorders, such as Wolf-Parkinson-White syndrome.  Other causes include:  Pneumonia.  Obstructive sleep apnea.  Blockage of an artery in the lungs (pulmonary embolism, or PE).  Lung cancer.  Chronic lung disease.  Thyroid problems, especially if the thyroid is overactive (hyperthyroidism).  Caffeine.  Excessive alcohol use or illegal drug use.  Use of some medicines, including certain decongestants and diet pills.  Sometimes, the cause cannot be found. What increases the risk? This condition is more likely to develop in:  People who are older in age.  People who smoke.  People who have diabetes mellitus.  People who are overweight  (obese).  Athletes who exercise vigorously.  What are the signs or symptoms? Symptoms of this condition include:  A feeling that your heart is beating rapidly or irregularly.  A feeling of discomfort or pain in your chest.  Shortness of breath.  Sudden light-headedness or weakness.  Getting tired easily during exercise.  In some cases, there are no symptoms. How is this diagnosed? Your health care provider may be able to detect atrial fibrillation when taking your pulse. If detected, this condition may be diagnosed with:  An electrocardiogram (ECG).  A Holter monitor test that records your heartbeat patterns over a 24-hour period.  Transthoracic echocardiogram (TTE) to evaluate how blood flows through your heart.  Transesophageal echocardiogram (TEE) to view more detailed images of your heart.  A stress test.  Imaging tests, such as a CT scan or chest X-ray.  Blood tests.  How is this treated? The main goals of treatment are to prevent blood clots from forming and to keep your heart beating at a normal rate and rhythm. The type of treatment that you receive depends on many factors, such as your underlying medical conditions and how you feel when you are experiencing atrial fibrillation. This condition may be treated with:  Medicine to slow down the heart rate, bring the heart's rhythm back to normal, or prevent clots from forming.  Electrical cardioversion. This is a procedure that resets your heart's rhythm by delivering a controlled, low-energy shock to the heart through your skin.  Different types of ablation, such as catheter ablation, catheter ablation with pacemaker, or surgical ablation. These procedures destroy the heart tissues that send abnormal signals. When the pacemaker is used, it is placed under your skin to help your heart beat in   a regular rhythm.  Follow these instructions at home:  Take over-the counter and prescription medicines only as told by your  health care provider.  If your health care provider prescribed a blood-thinning medicine (anticoagulant), take it exactly as told. Taking too much blood-thinning medicine can cause bleeding. If you do not take enough blood-thinning medicine, you will not have the protection that you need against stroke and other problems.  Do not use tobacco products, including cigarettes, chewing tobacco, and e-cigarettes. If you need help quitting, ask your health care provider.  If you have obstructive sleep apnea, manage your condition as told by your health care provider.  Do not drink alcohol.  Do not drink beverages that contain caffeine, such as coffee, soda, and tea.  Maintain a healthy weight. Do not use diet pills unless your health care provider approves. Diet pills may make heart problems worse.  Follow diet instructions as told by your health care provider.  Exercise regularly as told by your health care provider.  Keep all follow-up visits as told by your health care provider. This is important. How is this prevented?  Avoid drinking beverages that contain caffeine or alcohol.  Avoid certain medicines, especially medicines that are used for breathing problems.  Avoid certain herbs and herbal medicines, such as those that contain ephedra or ginseng.  Do not use illegal drugs, such as cocaine and amphetamines.  Do not smoke.  Manage your high blood pressure. Contact a health care provider if:  You notice a change in the rate, rhythm, or strength of your heartbeat.  You are taking an anticoagulant and you notice increased bruising.  You tire more easily when you exercise or exert yourself. Get help right away if:  You have chest pain, abdominal pain, sweating, or weakness.  You feel nauseous.  You notice blood in your vomit, bowel movement, or urine.  You have shortness of breath.  You suddenly have swollen feet and ankles.  You feel dizzy.  You have sudden weakness or  numbness of the face, arm, or leg, especially on one side of the body.  You have trouble speaking, trouble understanding, or both (aphasia).  Your face or your eyelid droops on one side. These symptoms may represent a serious problem that is an emergency. Do not wait to see if the symptoms will go away. Get medical help right away. Call your local emergency services (911 in the [place] Do not drive yourself to the hospital. This information is not intended to replace advice given to you by your health care provider. Make sure you discuss any questions you have with your health care provider. Document Released: 08/18/2005 Document Revised: 12/26/2015 Document Reviewed: 12/13/2014 Elsevier Interactive Patient Education  2017 Elsevier Inc.  

## 2017-02-17 NOTE — Progress Notes (Signed)
Per Lab, troponin was 0.04. MD on call paged, no new orders at this time. Will continue to monitor.

## 2017-02-17 NOTE — H&P (Signed)
History and Physical    Jimmy Myers NTI:[id number] DOB: Jun 14, 1932 DOA: 02/17/2017  PCP: [name], MD Patient coming from: home  Chief Complaint: sob and palpitations.   HPI: Jimmy Myers is a 83 y.o. male with medical history significant of a defibrillation, anxiety, atrial thrombus, lymphoma, CHF, GERD, hypertension. Patient presented with 3-4 day history of shortness of breath. Gradual onset. Associated with right ankle and foot swelling. The swelling does not improve with elevation and rest or with increased Lasix dosing. Associated with intermittent dry cough and palpitations. Denies chest pain, fevers, nausea, vomiting, abdominal pain, dysuria, frequency, focal neurological deficit, headache, LOC. Patient states the shortness of breath is getting worse and is now constant. Patient does have some baseline sinus drainage which he does not feels contributed to current symptoms. Patient was taken off of his Coumadin on 10/21/2016 due to cancer treatments per patient. Patient is currently in between rounds of radiation. It is due to follow-up on 03/05/2017 with his oncologist for additional imaging to decide on further cancer therapies for cholangiocarcinoma.   ED Course: Objective findings outlined below. Started on diltiazem drip with excellent control of his heart rate.  Review of Systems: As per HPI otherwise all other systems reviewed and are negative  Ambulatory Status: Slow purposeful movements but no overt restrictions.  Past Medical History:  Diagnosis Date  . A-fib [facility]) May 2013   s/p TEE/cardioversion June 2013  . Anxiety   . Aortic root enlargement (HCC)    at least 5cm by CT  . Arthritis   . Atrial thrombus   . Bradycardia   . Bronchitis   . Cancer ([place]) 2016   lymphoma  . Chronic anticoagulation    on coumadin - checked in [place]  . Chronic systolic heart failure ([place]) 12/28/2011  . Congestive heart failure (CHF) ([place]) 2013  . Diverticulosis   . Dysrhythmia      AFib  . GERD (gastroesophageal reflux disease)   . Hemorrhoids   . HTN (hypertension)   . PE (pulmonary thromboembolism) ([place]) 01/2017  . Pneumonia May 2013  . Pneumonia 2013  . 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 Thrombocytopenia ([place])     Past Surgical History:  Procedure Laterality Date  . CARDIOVERSION  12/30/2011   Procedure: CARDIOVERSION;  Surgeon: Lelon Perla, MD;  Location: [facility] ENDOSCOPY;  Service: Cardiovascular;  Laterality: N/A;  . CARDIOVERSION  02/16/2012   Procedure: CARDIOVERSION;  Surgeon: Lelon Perla, MD;  Location: [facility] ENDOSCOPY;  Service: Cardiovascular;  Laterality: N/A;  . CATARACT EXTRACTION W/PHACO Left 07/28/2016   Procedure: CATARACT EXTRACTION PHACO AND INTRAOCULAR LENS PLACEMENT LEFT EYE:  CDE: 13.00;  Surgeon: Tonny Branch, MD;  Location: [facility] ORS;  Service: Ophthalmology;  Laterality: Left;  . ENDOSCOPIC RETROGRADE CHOLANGIOPANCREATOGRAPHY (ERCP) WITH PROPOFOL N/A 10/24/2016   Procedure: ENDOSCOPIC RETROGRADE CHOLANGIOPANCREATOGRAPHY (ERCP) WITH PROPOFOL;  Surgeon: Doran Stabler, MD;  Location: [place] ENDOSCOPY;  Service: Endoscopy;  Laterality: N/A;  . ESOPHAGOGASTRODUODENOSCOPY  12/30/2011   Procedure: ESOPHAGOGASTRODUODENOSCOPY (EGD);  Surgeon: Lelon Perla, MD;  Location: [facility] ENDOSCOPY;  Service: Cardiovascular;  Laterality: N/A;  . ESOPHAGOGASTRODUODENOSCOPY  12/30/2011   Procedure: ESOPHAGOGASTRODUODENOSCOPY (EGD);  Surgeon: Lafayette Dragon, MD;  Location: [facility] ENDOSCOPY;  Service: Endoscopy;  Laterality: N/A;  . IR BILIARY STENT(S) EXISTING ACCESS INC DILATION CATH EXCHANGE  12/16/2016  .  IR GENERIC HISTORICAL  10/25/2016   IR INT EXT BILIARY DRAIN WITH CHOLANGIOGRAM 10/25/2016 Greggory Keen, MD [facility]-INTERV RAD  . IR GENERIC HISTORICAL  10/28/2016   IR EXCHANGE BILIARY DRAIN 10/28/2016 Greggory Keen, MD [facility]   . IR GENERIC HISTORICAL  10/28/2016   IR ENDOLUMINAL BX OF BILIARY TREE 10/28/2016 Greggory Keen, MD [facility]INTER[facility]  . IR GENERIC HISTORICAL  11/21/2016   IR EXCHANGE BILIARY DRAIN 11/21/2016 Corrie Mckusick, DO [facility]-INTERV RAD  . IR RADIOLOGIST EVAL & MGMT  12/03/2016  . IR RADIOLOGIST EVAL & MGMT  11/11/2016  . IR REMOVAL BILIARY DRAIN  12/30/2016  . KNEE ARTHROSCOPY  rt knee  . PROSTATE SURGERY    . TEE WITHOUT CARDIOVERSION  12/30/2011   Procedure: TRANSESOPHAGEAL ECHOCARDIOGRAM (TEE);  Surgeon: Lelon Perla, MD;  Location: [facility] ENDOSCOPY;  Service: Cardiovascular;  Laterality: N/A;  . TEE WITHOUT CARDIOVERSION  02/16/2012   Procedure: TRANSESOPHAGEAL ECHOCARDIOGRAM (TEE);  Surgeon: Lelon Perla, MD;  Location: [facility] ENDOSCOPY;  Service: Cardiovascular;  Laterality: N/A;    Social History   Social History  . Marital status: Married    Spouse name: N/A  . Number of children: 2  . Years of education: N/A   Occupational History  . retired Retired   Social History Main Topics  . Smoking status: Never Smoker  . Smokeless tobacco: Never Used  . Alcohol use No  . Drug use: No  . Sexual activity: Not Currently    Birth control/ protection: None   Other Topics Concern  . Not on file   Social History Narrative  . No narrative on file    Allergies  Allergen Reactions  . Codeine Other (See Comments)    nervousness  . Simvastatin Other (See Comments)    Muscle pain/hurting  . Vicodin [Hydrocodone-Acetaminophen] Other (See Comments)    nervousness    Family History  Problem Relation Age of Onset  . Heart disease Mother   . Heart disease Father   . Stomach cancer Brother   . Heart disease Sister   . Heart disease Brother   . Hypertension Sister       Prior to Admission medications   Medication Sig Start Date End Date Taking? Authorizing Provider  acetaminophen (TYLENOL) 325 MG tablet Take 325 mg by mouth every 6 (six) hours as needed.    [provider]  albuterol  (PROVENTIL HFA;VENTOLIN HFA) 108 (90 BASE) MCG/ACT inhaler Inhale 2 puffs into the lungs every 6 (six) hours as needed for wheezing or shortness of breath.    [provider]  clorazepate (TRANXENE) 3.75 MG tablet Give .05 tablet by mouth daily as needed for anxiety    [provider]  fluticasone (FLONASE) 50 MCG/ACT nasal spray Place 1 spray into both nostrils daily as needed for allergies or rhinitis.    [provider]  folic acid (FOLVITE) 509 MCG tablet Take 800 mcg by mouth daily.    [provider]  furosemide (LASIX) 40 MG tablet TAKE ONE TABLET BY MOUTH ONCE DAILY 09/26/16   Burtis Junes, NP  nitroGLYCERIN (NITROSTAT) 0.4 MG SL tablet Place 1 tablet (0.4 mg total) under the tongue every 5 (five) minutes x 3 doses as needed for chest pain. 05/15/14 10/06/17  Burtis Junes, NP  omeprazole (PRILOSEC) 20 MG capsule Take 20 mg by mouth daily before breakfast.     [provider]  polyethylene glycol (MIRALAX / GLYCOLAX) packet Take 17 g by mouth at bedtime as  needed.     [provider]  potassium chloride (K-DUR) 10 MEQ tablet Take 1 tablet (10 mEq total) by mouth daily. 12/24/16   Burtis Junes, NP  traZODone (DESYREL) 50 MG tablet Take 1-2 tablets (50-100 mg total) by mouth at bedtime as needed for sleep. 01/27/17   [name], MD    Physical Exam: Vitals:   02/17/17 1545 02/17/17 1552 02/17/17 1600 02/17/17 1624  BP: (!) 113/92  120/81 123/82  Pulse: (!) 124 (!) 116 (!) 102 (!) 112  Resp: (!) 23 (!) 25 (!) 25 (!) 26  Temp:    (!) 101 F (38.3 C)  TempSrc:    Oral  SpO2: 98% 98% 98% 100%  Height:    5\' 10"  (1.778 m)     General:  Appears calm and comfortable Eyes:  PERRL, EOMI, normal lids, iris ENT:  grossly normal hearing, lips & tongue, mmm Neck:  no LAD, masses or thyromegaly Cardiovascular:  RRR, no m/r/g. RLE 2+ Pitting edema up to mid calf, LLE trace to 1+ pitting edema to mid calf Respiratory: Increased  effort, right lower lung field posteriorly with few crackles. Abdomen:  soft, ntnd, NABS Skin:  no rash or induration seen on limited exam Musculoskeletal:  grossly normal tone BUE/BLE, good ROM, no bony abnormality Psychiatric:  grossly normal mood and affect, speech fluent and appropriate, AOx3 Neurologic: CN 2-12 grossly intact, moves all extremities in coordinated fashion, sensation intact  Labs on Admission: I have personally reviewed following labs and imaging studies  CBC:  Recent Labs Lab 02/17/17 1219  WBC 7.6  HGB 13.0  HCT 40.7  MCV 98.3  PLT 413*   Basic Metabolic Panel:  Recent Labs Lab 02/17/17 1219  NA 139  K 4.6  CL 105  CO2 27  GLUCOSE 83  BUN 25*  CREATININE 1.36*  CALCIUM 9.1   GFR: Estimated Creatinine Clearance: 47.9 mL/min (A) (by C-G formula based on SCr of 1.36 mg/dL (H)). Liver Function Tests: No results for input(s): AST, ALT, ALKPHOS, BILITOT, PROT, ALBUMIN in the last 168 hours. No results for input(s): LIPASE, AMYLASE in the last 168 hours. No results for input(s): AMMONIA in the last 168 hours. Coagulation Profile: No results for input(s): INR, PROTIME in the last 168 hours. Cardiac Enzymes: No results for input(s): CKTOTAL, CKMB, CKMBINDEX, TROPONINI in the last 168 hours. BNP (last 3 results)  Recent Labs  12/08/16 1238 12/23/16 0953 01/12/17 0927  PROBNP 3,713* 5,245* 4,653*   HbA1C: No results for input(s): HGBA1C in the last 72 hours. CBG: No results for input(s): GLUCAP in the last 168 hours. Lipid Profile: No results for input(s): CHOL, HDL, LDLCALC, TRIG, CHOLHDL, LDLDIRECT in the last 72 hours. Thyroid Function Tests: No results for input(s): TSH, T4TOTAL, FREET4, T3FREE, THYROIDAB in the last 72 hours. Anemia Panel: No results for input(s): VITAMINB12, FOLATE, FERRITIN, TIBC, IRON, RETICCTPCT in the last 72 hours. Urine analysis:    Component Value Date/Time   COLORURINE YELLOW 12/28/2011 1658   APPEARANCEUR  Clear 10/22/2016 1232   LABSPEC 1.012 12/28/2011 1658   PHURINE 5.5 12/28/2011 1658   GLUCOSEU Negative 10/22/2016 1232   HGBUR SMALL (A) 12/28/2011 1658   BILIRUBINUR Positive (A) 10/22/2016 1232   KETONESUR NEGATIVE 12/28/2011 1658   PROTEINUR Negative 10/22/2016 1232   PROTEINUR NEGATIVE 12/28/2011 1658   UROBILINOGEN 1.0 12/28/2011 1658   NITRITE Negative 10/22/2016 1232   NITRITE NEGATIVE 12/28/2011 1658   LEUKOCYTESUR Negative 10/22/2016 1232    Creatinine  Clearance: Estimated Creatinine Clearance: 47.9 mL/min (A) (by C-G formula based on SCr of 1.36 mg/dL (H)).  Sepsis Labs: @LABRCNTIP (procalcitonin:4,lacticidven:4) )No results found for this or any previous visit (from the past 240 hour(s)).   Radiological Exams on Admission: Dg Chest 2 View  Result Date: 02/17/2017 CLINICAL DATA:  3-4 days of shortness of breath. No chest pain. History of CHF, atrial fibrillation. EXAM: CHEST  2 VIEW COMPARISON:  Chest CT scan of October 31, 2016 and chest x-ray of Jan 06, 2012. FINDINGS: The lungs remain mildly hyperinflated. There are small bilateral pleural effusions layering posteriorly which are slightly more conspicuous than on the previous study. The cardiac silhouette is enlarged. The pulmonary vascularity is engorged. There is calcification in the wall of the thoracic aorta. The bony thorax exhibits no acute abnormality. IMPRESSION: Hyperinflation consistent with chronic bronchitis or reactive airway disease, stable. Cardiomegaly, pulmonary vascular congestion, and mild pulmonary interstitial edema consistent with CHF. Small bilateral pleural effusions more conspicuous than on the previous study. Thoracic aortic atherosclerosis. Electronically Signed   By: Orlan Aversa  [place] M.D.   On: 02/17/2017 13:14   Ct Angio Chest Pe W/cm &/or Wo Cm  Result Date: 02/17/2017 CLINICAL DATA:  Shortness of breath for 4 days, atrial fibrillation, no chest pain; history systolic CHF, hypertension EXAM: CT  ANGIOGRAPHY CHEST WITH CONTRAST TECHNIQUE: Multidetector CT imaging of the chest was performed using the standard protocol during bolus administration of intravenous contrast. Multiplanar CT image reconstructions and MIPs were obtained to evaluate the vascular anatomy. CONTRAST:  80 cc Isovue 370 IV COMPARISON:  None. FINDINGS: Cardiovascular: Atherosclerotic calcifications aorta and coronary arteries. Aneurysmal dilatation ascending thoracic aorta 4.8 cm transverse image 78 unchanged. No pericardial effusion. Dilatation of LEFT ventricle. Pulmonary arteries well opacified. Few scattered respiratory motion artifacts. Small filling defect identified within a posterior RIGHT lower lobe pulmonary artery compatible pulmonary embolism. No additional pulmonary emboli identified. No RIGHT ventricular dilatation. Mediastinum/Nodes: Large LEFT thyroid mass again identified 5.3 x 4.2 cm, measured 5.7 x 3.6 cm on 09/17/2015. No thoracic adenopathy. Esophagus unremarkable. Lungs/Pleura: Small BILATERAL pleural effusions with compressive atelectasis of the lower lobes. Central peribronchial thickening. No definite infiltrate or pneumothorax. Upper Abdomen: Pneumobilia unchanged. Remaining visualized upper abdomen unremarkable. Musculoskeletal: Diffuse osseous demineralization. Scattered degenerative disc disease changes thoracic spine. Review of the MIP images confirms the above findings. IMPRESSION: Small pulmonary embolus in a RIGHT lower lobe pulmonary artery with small BILATERAL pleural effusions with compressive atelectasis of the posterior lower lobes. Stable LEFT thyroid mass. Coronary arterial calcification. Aneurysmal dilatation ascending thoracic aorta 4.8 cm transverse unchanged, recommendation below. Ascending thoracic aortic aneurysm. Recommend semi-annual imaging followup by CTA or MRA and referral to cardiothoracic surgery if not already obtained. This recommendation follows 2010  ACCF/AHA/AATS/ACR/ASA/SCA/SCAI/SIR/STS/SVM Guidelines for the Diagnosis and Management of Patients With Thoracic Aortic Disease. Circulation. 2010; 121: E332-R518 Aortic Atherosclerosis (ICD10-I70.0). Aortic aneurysm NOS (ICD10-I71.9). Critical Value/emergent results were called by telephone at the time of interpretation on 02/17/2017 at 2:29 pm to Dr. Quintella Reichert , who verbally acknowledged these results. Electronically Signed   By: Lavonia Dana M.D.   On: 02/17/2017 14:29    EKG: Independently reviewed. Afib, RVR, no overt sign of ACS  Assessment/Plan Active Problems:   HTN (hypertension), benign   Cholangiocarcinoma (HCC)   Pulmonary embolism (HCC)   Chronic combined systolic (congestive) and diastolic (congestive) heart failure (HCC)   Acute respiratory failure (HCC)     Afib/RVR: History of A. fib. RVR episode likely precipitated by pulmonary embolus. Will  maintain on diltiazem drip. No home rate controlling medications. - Wean off dilt drip - Start Dilt 30 Q6 - cycle trop - Echo - Mag/TSH/T4/BNP - Resume anticoagulation as below  PE: likely from RLE DVT. CTA as above. Patient hypercoagulable state. Patient states that he was told to come off of his anticoagulation in February due to treatments for his cholangiocarcinoma. Unsure where this information may have come from and suspect patient may been mistaken as I discussed this with his oncologist who states that he recommends the exact opposite in that patient with ongoing cancer need anticoagulation therapy especially given his history of atrial fibrillation. Due to patient's fall risk at this time would recommend not resuming Coumadin but starting NOAC - Heparin drip - Initiate Eliquis on 6/20 once stable and workup complete - RLE Duplex  CHF: chronic. No evidence of decompensation. EF 40% and grade 1 diastolic dysfunction - Strict I/O, daily wts - continue lasix  Cholangiocarcinoma: Patient currently in between treatments of  radiation. Next appointment 03/05/2017. Discussed case with patient's oncologist, Dr.Kale, who agrees with treatment plan as outlined above. - Follow-up outpatient - CMP in am  Hypoxic respiratory failure: O2 saturations as low as 85%. Likely due to pulmonary embolus. No evidence of acute infectious process. - Treatment as above - O2 when necessary  GERD: - continue ppi  Allergies: currently w/ chronic post nasal drip - continue flonase - Claritin     DVT prophylaxis: Heparin Drip  Code Status: full  Family Communication: wife  Disposition Plan: pending improvement in overall condition, workup for heart strain, and initiation of long term anticoagulation  Consults called: Oncology - phone only  Admission status: inpt    Renalda Locklin J MD [facility]  If 7PM-7AM, please contact night-coverage [email or web address] Password [facility]  02/17/2017, 5:16 PM

## 2017-02-17 NOTE — ED Triage Notes (Signed)
Pt here for increased SOB and noted tachy with afib; hx of same no blood thinner since Feb with diagnosis of CA

## 2017-02-17 NOTE — ED Provider Notes (Signed)
[facility] DEPT Provider Note   CSN: [id number] Arrival date & time: 02/17/17  1209     History   Chief Complaint Chief Complaint  Patient presents with  . Palpitations  . Shortness of Breath    HPI Jimmy Myers is a 83 y.o. male.  The history is provided by the patient. No language interpreter was used.  Palpitations   Associated symptoms include shortness of breath.  Shortness of Breath     Jimmy Myers is a 83 y.o. male who presents to the Emergency Department complaining of SOB.  He presents for evaluation of progressive shortness of breath over the last 3-4 days. He does have a history of atrial fibrillation and is not currently on blood thinners since starting his cancer treatment in February. He reports difficulty breathing with nonproductive cough. He also has swelling to the right foot and ankle that is new for him. No fevers, vomiting. He does have some abdominal discomfort due to history of cancer and radiation to the abdomen. Symptoms are moderate, constant, worsening.  Past Medical History:  Diagnosis Date  . A-fib [facility]) May 2013   s/p TEE/cardioversion June 2013  . Anxiety   . Aortic root enlargement (HCC)    at least 5cm by CT  . Arthritis   . Atrial thrombus   . Bradycardia   . Bronchitis   . Cancer ([place]) 2016   lymphoma  . Chronic anticoagulation    on coumadin - checked in [place]  . Chronic systolic heart failure ([place]) 12/28/2011  . Congestive heart failure (CHF) ([place]) 2013  . Diverticulosis   . Dysrhythmia    AFib  . GERD (gastroesophageal reflux disease)   . Hemorrhoids   . HTN (hypertension)   . Pneumonia May 2013  . Pneumonia 2013  . 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 Thrombocytopenia [facility])     Patient Active Problem List   Diagnosis Date Noted  . Systolic CHF ([place]) 26/37/8588  . Cholangitis   .  Cholangiocarcinoma ([place])   . Obstructive jaundice   . Hyperbilirubinemia   . Common bile duct stricture   . Jaundice 10/22/2016  . Supratherapeutic INR 10/22/2016  . GERD (gastroesophageal reflux disease) 10/22/2016  . Anxiety 10/22/2016  . Cough 10/09/2016  . Thyroid goiter 02/26/2012  . Aortic root enlargement ([place]) 01/12/2012  . Long term (current) use of anticoagulants 01/06/2012  . Thrombus of left atrial appendage 01/06/2012  . Acute Systolic dysfunction/cardiomyopathy due to prolonged tachycardia/EF 25% 12/30/2011  . Thrombocytopenia ([place]) 12/30/2011  . Other dysphagia 12/30/2011  . Atrial fibrillation 12/28/2011  . HTN (hypertension), benign 12/28/2011    Past Surgical History:  Procedure Laterality Date  . CARDIOVERSION  12/30/2011   Procedure: CARDIOVERSION;  Surgeon: Lelon Perla, MD;  Location: [facility] ENDOSCOPY;  Service: Cardiovascular;  Laterality: N/A;  . CARDIOVERSION  02/16/2012   Procedure: CARDIOVERSION;  Surgeon: Lelon Perla, MD;  Location: [facility] ENDOSCOPY;  Service: Cardiovascular;  Laterality: N/A;  . CATARACT EXTRACTION W/PHACO Left 07/28/2016   Procedure: CATARACT EXTRACTION PHACO AND INTRAOCULAR LENS PLACEMENT LEFT EYE:  CDE: 13.00;  Surgeon: Tonny Branch, MD;  Location: [facility] ORS;  Service: Ophthalmology;  Laterality: Left;  . ENDOSCOPIC RETROGRADE CHOLANGIOPANCREATOGRAPHY (ERCP) WITH PROPOFOL N/A 10/24/2016   Procedure: ENDOSCOPIC RETROGRADE CHOLANGIOPANCREATOGRAPHY (ERCP) WITH PROPOFOL;  Surgeon: Doran Stabler, MD;  Location: [facility] ENDOSCOPY;  Service: Endoscopy;  Laterality: N/A;  . ESOPHAGOGASTRODUODENOSCOPY  12/30/2011   Procedure: ESOPHAGOGASTRODUODENOSCOPY (EGD);  Surgeon: Lelon Perla, MD;  Location: [facility] ENDOSCOPY;  Service: Cardiovascular;  Laterality: N/A;  . ESOPHAGOGASTRODUODENOSCOPY  12/30/2011   Procedure: ESOPHAGOGASTRODUODENOSCOPY (EGD);  Surgeon: Lafayette Dragon, MD;  Location: [facility] ENDOSCOPY;  Service: Endoscopy;  Laterality: N/A;  . IR BILIARY  STENT(S) EXISTING ACCESS INC DILATION CATH EXCHANGE  12/16/2016  . IR GENERIC HISTORICAL  10/25/2016   IR INT EXT BILIARY DRAIN WITH CHOLANGIOGRAM 10/25/2016 Greggory Keen, MD [facility]-INTERV RAD  . IR GENERIC HISTORICAL  10/28/2016   IR EXCHANGE BILIARY DRAIN 10/28/2016 Greggory Keen, MD [facility]INTER[facility]  . IR GENERIC HISTORICAL  10/28/2016   IR ENDOLUMINAL BX OF BILIARY TREE 10/28/2016 Greggory Keen, MD [facility]-INTER[facility]  . IR GENERIC HISTORICAL  11/21/2016   IR EXCHANGE BILIARY DRAIN 11/21/2016 Corrie Mckusick, DO [facility]-INTERV RAD  . IR RADIOLOGIST EVAL & MGMT  12/03/2016  . IR RADIOLOGIST EVAL & MGMT  11/11/2016  . IR REMOVAL BILIARY DRAIN  12/30/2016  . KNEE ARTHROSCOPY  rt knee  . PROSTATE SURGERY    . TEE WITHOUT CARDIOVERSION  12/30/2011   Procedure: TRANSESOPHAGEAL ECHOCARDIOGRAM (TEE);  Surgeon: Lelon Perla, MD;  Location: [facility] ENDOSCOPY;  Service: Cardiovascular;  Laterality: N/A;  . TEE WITHOUT CARDIOVERSION  02/16/2012   Procedure: TRANSESOPHAGEAL ECHOCARDIOGRAM (TEE);  Surgeon: Lelon Perla, MD;  Location: [facility] ENDOSCOPY;  Service: Cardiovascular;  Laterality: N/A;       Home Medications    Prior to Admission medications   Medication Sig Start Date End Date Taking? Authorizing Provider  acetaminophen (TYLENOL) 325 MG tablet Take 325 mg by mouth every 6 (six) hours as needed.    [provider]  albuterol (PROVENTIL HFA;VENTOLIN HFA) 108 (90 BASE) MCG/ACT inhaler Inhale 2 puffs into the lungs every 6 (six) hours as needed for wheezing or shortness of breath.    [provider]  clorazepate (TRANXENE) 3.75 MG tablet Give .05 tablet by mouth daily as needed for anxiety    [provider]  fluticasone (FLONASE) 50 MCG/ACT nasal spray Place 1 spray into both nostrils daily as needed for allergies or rhinitis.    [provider]  folic acid (FOLVITE) 875 MCG tablet Take 800 mcg by mouth daily.    [provider]  furosemide (LASIX) 40 MG tablet TAKE ONE TABLET  BY MOUTH ONCE DAILY 09/26/16   Burtis Junes, NP  nitroGLYCERIN (NITROSTAT) 0.4 MG SL tablet Place 1 tablet (0.4 mg total) under the tongue every 5 (five) minutes x 3 doses as needed for chest pain. 05/15/14 10/06/17  Burtis Junes, NP  omeprazole (PRILOSEC) 20 MG capsule Take 20 mg by mouth daily before breakfast.     [provider]  polyethylene glycol (MIRALAX / GLYCOLAX) packet Take 17 g by mouth at bedtime as needed.     [provider]  potassium chloride (K-DUR) 10 MEQ tablet Take 1 tablet (10 mEq total) by mouth daily. 12/24/16   Burtis Junes, NP  traZODone (DESYREL) 50 MG tablet Take 1-2 tablets (50-100 mg total) by mouth at bedtime as needed for sleep. 01/27/17   [name], MD    Family History Family History  Problem Relation Age of Onset  . Heart disease Mother   . Heart disease Father   . Stomach cancer Brother   . Heart disease Sister   . Heart disease Brother   . Hypertension Sister  Social History Social History  Substance Use Topics  . Smoking status: Never Smoker  . Smokeless tobacco: Never Used  . Alcohol use No     Allergies   Codeine; Simvastatin; and Vicodin [hydrocodone-acetaminophen]   Review of Systems Review of Systems  Respiratory: Positive for shortness of breath.   Cardiovascular: Positive for palpitations.  All other systems reviewed and are negative.    Physical Exam Updated Vital Signs BP 109/84 (BP Location: Right Arm)   Pulse (!) 123   Temp 98.1 F (36.7 C) (Oral)   Resp (!) 22   SpO2 98%   Physical Exam  Constitutional: He is oriented to person, place, and time. He appears well-developed and well-nourished.  HENT:  Head: Normocephalic and atraumatic.  Cardiovascular:  No murmur heard. Tachycardic and irregular  Pulmonary/Chest: Effort normal and breath sounds normal. No respiratory distress.  Abdominal: Soft. There is no tenderness. There is no rebound and no guarding.  Musculoskeletal:    Right lower extremity is swollen compared to left lower extremity without any erythema. Nonpitting edema.  Neurological: He is alert and oriented to person, place, and time.  Skin: Skin is warm and dry.  Psychiatric: He has a normal mood and affect. His behavior is normal.  Nursing note and vitals reviewed.    ED Treatments / Results  Labs (all labs ordered are listed, but only abnormal results are displayed) Labs Reviewed  BASIC METABOLIC PANEL  CBC  I-STAT [place], ED    EKG  EKG Interpretation  Date/Time:  Tuesday February 17 2017 12:15:40 EDT Ventricular Rate:  128 PR Interval:    QRS Duration: 86 QT Interval:  308 QTC Calculation: 449 R Axis:   65 Text Interpretation:  Atrial fibrillation with rapid ventricular response with premature ventricular or aberrantly conducted complexes Cannot rule out Anterior infarct , age undetermined Abnormal ECG Confirmed by Hazle Coca [phone]) on 02/17/2017 12:37:01 PM       Radiology No results found.  Procedures Procedures (including critical care time)  Medications Ordered in ED Medications  diltiazem (CARDIZEM) 100 mg in dextrose 5 % 100 mL (1 mg/mL) infusion (not administered)     Initial Impression / Assessment and Plan / ED Course  I have reviewed the triage vital signs and the nursing notes.  Pertinent labs & imaging results that were available during my care of the patient were reviewed by me and considered in my medical decision making (see chart for details).     Pt here for SOB for last several days, has hx/o DVT and afib, not currently anticoagulated.  He is in afib with RVR in ED but in no acute distress.  Concern for PE given asymmetric lower extremity swelling.  CTA obtained c/w PE.  Treated with heparin drip given recurrent afib and PE.  Treated with diltiazem for afib.  Hospitalist consulted for admission for further treatment.    Final Clinical Impressions(s) / ED Diagnoses   Final diagnoses:  None    New  Prescriptions New Prescriptions   No medications on file     Quintella Reichert, MD 02/18/17 1026

## 2017-02-17 NOTE — Progress Notes (Signed)
ANTICOAGULATION CONSULT NOTE - Initial Consult  Pharmacy Consult for heparin  Indication: pulmonary embolus (h/o afib)  Allergies  Allergen Reactions  . Codeine Other (See Comments)    nervousness  . Simvastatin Other (See Comments)    Muscle pain/hurting  . Vicodin [Hydrocodone-Acetaminophen] Other (See Comments)    nervousness    Patient Measurements: Heparin Dosing Weight: 92 kg   Vital Signs: Temp: 98.1 F (36.7 C) (06/19 1246) Temp Source: Oral (06/19 1246) BP: 112/83 (06/19 1445) Pulse Rate: 124 (06/19 1445)  Labs:  Recent Labs  02/17/17 1219  HGB 13.0  HCT 40.7  PLT 112*  CREATININE 1.36*    Estimated Creatinine Clearance: 47.9 mL/min (A) (by C-G formula based on SCr of 1.36 mg/dL (H)).   Medical History: Past Medical History:  Diagnosis Date  . A-fib [facility]) May 2013   s/p TEE/cardioversion June 2013  . Anxiety   . Aortic root enlargement (HCC)    at least 5cm by CT  . Arthritis   . Atrial thrombus   . Bradycardia   . Bronchitis   . Cancer ([place]) 2016   lymphoma  . Chronic anticoagulation    on coumadin - checked in [place]  . Chronic systolic heart failure ([place]) 12/28/2011  . Congestive heart failure (CHF) ([place]) 2013  . Diverticulosis   . Dysrhythmia    AFib  . GERD (gastroesophageal reflux disease)   . Hemorrhoids   . HTN (hypertension)   . Pneumonia May 2013  . Pneumonia 2013  . 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 Thrombocytopenia [facility])    Assessment: 83 yo male admitted with SOB. He has history of afib and is not on oral anticoagulation PTA (due to cancer treatments?). Small RLL PE noted on imaging, no mention of RHS. Hgb stable and platelets slightly low at 112. No overt s/s bleeding noted.   Goal of Therapy:  Heparin level 0.3-0.7 units/ml Monitor platelets by anticoagulation protocol: Yes    Plan:  Heparin 4500 units x1, then start infusion at 1500 units/hr Heparin level in 8 hours Daily heparin level and CBC Monitor for s/s bleeding  Argie Ramming, PharmD Pharmacy Resident  Pager [phone] 02/17/17 3:16 PM

## 2017-02-17 NOTE — ED Notes (Signed)
Patient transported to CT 

## 2017-02-17 NOTE — Progress Notes (Signed)
Pt arrived from ED with cardizem drip at 10 mL/hr. Upon assessment in pt's MAR, the cardizem drip order was ordered to end at 1413. MD notified. Orders received to administer PO dose and stop drip around 30 minutes later, once pt's heart rate reached 90.  Grant Fontana BSN, RN

## 2017-02-17 NOTE — Progress Notes (Signed)
   Subjective:    Patient ID: Jimmy Myers, male    DOB: 08-Jul-1932, 83 y.o.   MRN: [id number]  PT presents to the office today with palpitations and SOB. PT is seen by Cardiologists for A Fib and CHF. Per Cardiologists note "He has systolic heart failure with an EF previously down to 20 to 25% with improvement after medical therapy - up to 50% per echo in August of 2013 and was 45 to 50% by echo in July of 2014. His other issues include atrial fib with prior cardioversion, bradycardia, enlarged ascending aorta (previously followed by Dr. [name])"  Palpitations   This is a recurrent problem. The current episode started today. The problem occurs constantly. The problem has been waxing and waning. Associated symptoms include anxiety, an irregular heartbeat, malaise/fatigue and shortness of breath. He has tried bed rest for the symptoms. The treatment provided no relief. Risk factors include being male. His past medical history is significant for anxiety and heart disease.      Review of Systems  Constitutional: Positive for activity change and malaise/fatigue.  Respiratory: Positive for chest tightness and shortness of breath.   Cardiovascular: Positive for palpitations.  Psychiatric/Behavioral: The patient is nervous/anxious.   All other systems reviewed and are negative.      Objective:   Physical Exam  Constitutional: He is oriented to person, place, and time. He appears well-developed and well-nourished. No distress.  Cardiovascular: Normal heart sounds and intact distal pulses.  An irregular rhythm present. Tachycardia present.   No murmur heard. Pulmonary/Chest: Effort normal and breath sounds normal. No respiratory distress. He has no wheezes.  Abdominal: Soft. Bowel sounds are normal. He exhibits no distension. There is no tenderness.  Musculoskeletal: Normal range of motion. He exhibits no edema or tenderness.  Neurological: He is alert and oriented to person, place, and time.    Skin: Skin is warm and dry. No rash noted. No erythema.  Psychiatric: He has a normal mood and affect. His behavior is normal. Judgment and thought content normal.  Vitals reviewed.    BP 119/66 (BP Location: Left Arm, Patient Position: Sitting, Cuff Size: Large)   Pulse (!) 138   Temp 97.3 F (36.3 C) (Oral)   Wt 203 lb 12.8 oz (92.4 kg)   SpO2 97%   BMI 29.24 kg/m      Assessment & Plan:  1. Tachycardia - EKG 12-Lead  2. Paroxysmal atrial fibrillation (HCC)  3. Systolic congestive heart failure, unspecified HF chronicity ([place])  4. Anxiety  5. Acute Systolic dysfunction/cardiomyopathy due to prolonged tachycardia/EF 25%  Spoke to Pt's Cardiologists, he advises we send pt to ED since he is having SOB and is in A Fib Pt not on warfarin since his diagnoses of cholangiocarcinoma Pt advised to go to ED and pt's Wife will drive him  Evelina Dun, FNP

## 2017-02-17 NOTE — Telephone Encounter (Signed)
Pt c/o Shortness Of Breath: STAT if SOB developed within the last 24 hours or pt is noticeably SOB on the phone  1. Are you currently SOB (can you hear that pt is SOB on the phone)? YES   2. How long have you been experiencing SOB? Several days  3. Are you SOB when sitting or when up moving around? constant  4. Are you currently experiencing any other symptoms? Palpitations, afib, tachycardia   Carlon calling with [place].

## 2017-02-17 NOTE — Telephone Encounter (Signed)
Call sent to Dr. Curt Bears (DOD) and he has already advised.

## 2017-02-18 ENCOUNTER — Inpatient Hospital Stay (HOSPITAL_COMMUNITY): Payer: PPO

## 2017-02-18 ENCOUNTER — Encounter (HOSPITAL_COMMUNITY): Payer: PPO

## 2017-02-18 LAB — COMPREHENSIVE METABOLIC PANEL
ALT: 38 U/L (ref 17–63)
AST: 27 U/L (ref 15–41)
Albumin: 3 g/dL — ABNORMAL LOW (ref 3.5–5.0)
Alkaline Phosphatase: 112 U/L (ref 38–126)
Anion gap: 6 (ref 5–15)
BILIRUBIN TOTAL: 1.3 mg/dL — AB (ref 0.3–1.2)
BUN: 26 mg/dL — AB (ref 6–20)
CO2: 26 mmol/L (ref 22–32)
CREATININE: 1.31 mg/dL — AB (ref 0.61–1.24)
Calcium: 8.2 mg/dL — ABNORMAL LOW (ref 8.9–10.3)
Chloride: 106 mmol/L (ref 101–111)
GFR calc Af Amer: 57 mL/min — ABNORMAL LOW (ref >60)
GFR, EST NON AFRICAN AMERICAN: 49 mL/min — AB (ref >60)
GLUCOSE: 104 mg/dL — AB (ref 65–99)
Potassium: 3.8 mmol/L (ref 3.5–5.1)
Sodium: 138 mmol/L (ref 135–145)
TOTAL PROTEIN: 5.2 g/dL — AB (ref 6.5–8.1)

## 2017-02-18 LAB — CBC
HCT: 35.5 % — ABNORMAL LOW (ref 39.0–52.0)
Hemoglobin: 11.4 g/dL — ABNORMAL LOW (ref 13.0–17.0)
MCH: 31.7 pg (ref 26.0–34.0)
MCHC: 32.1 g/dL (ref 30.0–36.0)
MCV: 98.6 fL (ref 78.0–100.0)
PLATELETS: 83 10*3/uL — AB (ref 150–400)
RBC: 3.6 MIL/uL — ABNORMAL LOW (ref 4.22–5.81)
RDW: 13.7 % (ref 11.5–15.5)
WBC: 5.2 10*3/uL (ref 4.0–10.5)

## 2017-02-18 LAB — HEPARIN LEVEL (UNFRACTIONATED)
HEPARIN UNFRACTIONATED: 0.63 [IU]/mL (ref 0.30–0.70)
Heparin Unfractionated: 0.6 IU/mL (ref 0.30–0.70)

## 2017-02-18 LAB — TROPONIN I
Troponin I: 0.04 ng/mL (ref <0.03)
Troponin I: 0.04 ng/mL (ref <0.03)

## 2017-02-18 MED ORDER — APIXABAN 5 MG PO TABS
5.0000 mg | ORAL_TABLET | Freq: Two times a day (BID) | ORAL | Status: DC
  Administered 2017-02-18: 5 mg via ORAL
  Filled 2017-02-18 (×3): qty 1

## 2017-02-18 MED ORDER — SODIUM CHLORIDE 0.9 % IV SOLN
INTRAVENOUS | Status: DC
  Administered 2017-02-18 (×2): via INTRAVENOUS

## 2017-02-18 MED ORDER — ZOLPIDEM TARTRATE 5 MG PO TABS
5.0000 mg | ORAL_TABLET | Freq: Once | ORAL | Status: AC
  Administered 2017-02-18: 5 mg via ORAL
  Filled 2017-02-18: qty 1

## 2017-02-18 NOTE — Progress Notes (Signed)
PROGRESS NOTE    Jimmy Myers  NFA:[id number] DOB: Jan 14, 1932 DOA: 02/17/2017 PCP: [name], MD    Brief Narrative:  Jimmy Myers is a 83 y.o. male with medical history significant of a defibrillation, anxiety, atrial thrombus, lymphoma, CHF, GERD, hypertension, cholangiocarcinoma,  Patient presented with 3-4 day history of shortness of breath. on arrival to ED, he was found to have A fib with RVR and PE.   Assessment & Plan:   Active Problems:   HTN (hypertension), benign   Cholangiocarcinoma ([place])   Pulmonary embolism (HCC)   Chronic combined systolic (congestive) and diastolic (congestive) heart failure (HCC)   Acute respiratory failure (HCC)   Acute respiratory failure with hypoxia secondary to Pulmonary embolus:  Started him on IV heparin , plan to transition him to oral eliquis later on. Nasal canula oxygen as needed to keep sats greater than 90%.  Echocardiogram pending.    chronic diastolic heart failure: he appears euvolemic.    H/o Cholangiocarcinoma:  Follows up with Dr Irene Limbo. Outpatient follow up with Dr Irene Limbo.   Afib with RVR:  RATE better this am.  Initially started on IV Cardizem and transitioned to po Cardizem. TSH wnl.  Mildly elevated troponin's possibly from the afib with RVR.     Mild thrombocytopenia: slightly lower when compared to yesterday.  He has chronic thrombocytopenia, and platelets have been as low as 63.   monitor on IV heparin.  Repeat cbc in am.    Stage 2 CKD:  His baseline creatinine is 1.2, today is 1.3, continue to monitor creatinine.   Aneurysmal dilatation ascending thoracic aorta 4.8 cm transverse Unchanged Semi annual follow up with an [place].  Follow up with cardiothoracic surgery as outpatient.     Mild normocytic anemia: baseline creatinine at 11.  Stable.         DVT prophylaxis: (IV heparin Code Status: (Full) Family Communication: none at bedside, discussed the plan of care with the patient.    Disposition Plan: (pending complete work up for PE.   Consultants:   NONE.    Procedures: (ECHOCARDIOGRAM.    Antimicrobials: NONE.    Subjective: no chest pain or sob.  Was little bit upset that he ended up having PE, because he stopped his anti coagulation.  No nausea, or vomiting.  No headache or dizziness.   Objective: Vitals:   02/17/17 2045 02/18/17 0101 02/18/17 0441 02/18/17 1253  BP: (!) 89/62 (!) 92/56 108/64 (!) 91/57  Pulse: 93 87 97 (!) 120  Resp: 18  18 18   Temp: (!) 100.5 F (38.1 C) 98.8 F (37.1 C) 98.6 F (37 C) 97.9 F (36.6 C)  TempSrc: Oral Oral Oral Oral  SpO2: 97%  96% 97%  Weight:   90.4 kg (199 lb 6.4 oz)   Height:        Intake/Output Summary (Last 24 hours) at 02/18/17 1555 Last data filed at 02/17/17 1900  Gross per 24 hour  Intake                0 ml  Output              225 ml  Net             -225 ml   Filed Weights   02/17/17 1624 02/18/17 0441  Weight: 90.2 kg (198 lb 13.7 oz) 90.4 kg (199 lb 6.4 oz)    Examination:  General exam: Appears calm and comfortable  Respiratory system: Clear to auscultation.  Respiratory effort normal. Cardiovascular system: S1 & S2 heard,irregular,   Gastrointestinal system: Abdomen is nondistended, soft and nontender. No organomegaly or masses felt. Normal bowel sounds heard. Central nervous system: Alert and oriented. No focal neurological deficits. Extremities: rigth foot and ankle swelling Skin: No rashes, lesions or ulcers Psychiatry: Judgement and insight appear normal. Mood & affect appropriate.     Data Reviewed: I have personally reviewed following labs and imaging studies  CBC:  Recent Labs Lab 02/17/17 1219 02/18/17 0537  WBC 7.6 5.2  HGB 13.0 11.4*  HCT 40.7 35.5*  MCV 98.3 98.6  PLT 112* 83*   Basic Metabolic Panel:  Recent Labs Lab 02/17/17 1219 02/17/17 1819 02/18/17 0537  NA 139  --  138  K 4.6  --  3.8  CL 105  --  106  CO2 27  --  26  GLUCOSE 83  --   104*  BUN 25*  --  26*  CREATININE 1.36*  --  1.31*  CALCIUM 9.1  --  8.2*  MG  --  2.0  --    GFR: Estimated Creatinine Clearance: 49.2 mL/min (A) (by C-G formula based on SCr of 1.31 mg/dL (H)). Liver Function Tests:  Recent Labs Lab 02/18/17 0537  AST 27  ALT 38  ALKPHOS 112  BILITOT 1.3*  PROT 5.2*  ALBUMIN 3.0*   No results for input(s): LIPASE, AMYLASE in the last 168 hours. No results for input(s): AMMONIA in the last 168 hours. Coagulation Profile: No results for input(s): INR, PROTIME in the last 168 hours. Cardiac Enzymes:  Recent Labs Lab 02/17/17 1819 02/17/17 2338 02/18/17 0537  TROPONINI 0.04* 0.04* 0.04*   BNP (last 3 results)  Recent Labs  12/08/16 1238 12/23/16 0953 01/12/17 0927  PROBNP 3,713* 5,245* 4,653*   HbA1C: No results for input(s): HGBA1C in the last 72 hours. CBG: No results for input(s): GLUCAP in the last 168 hours. Lipid Profile: No results for input(s): CHOL, HDL, LDLCALC, TRIG, CHOLHDL, LDLDIRECT in the last 72 hours. Thyroid Function Tests:  Recent Labs  02/17/17 1819  TSH 0.904  FREET4 1.12   Anemia Panel: No results for input(s): VITAMINB12, FOLATE, FERRITIN, TIBC, IRON, RETICCTPCT in the last 72 hours. Sepsis Labs: No results for input(s): PROCALCITON, LATICACIDVEN in the last 168 hours.  No results found for this or any previous visit (from the past 240 hour(s)).       Radiology Studies: Dg Chest 2 View  Result Date: 02/17/2017 CLINICAL DATA:  3-4 days of shortness of breath. No chest pain. History of CHF, atrial fibrillation. EXAM: CHEST  2 VIEW COMPARISON:  Chest CT scan of October 31, 2016 and chest x-ray of Jan 06, 2012. FINDINGS: The lungs remain mildly hyperinflated. There are small bilateral pleural effusions layering posteriorly which are slightly more conspicuous than on the previous study. The cardiac silhouette is enlarged. The pulmonary vascularity is engorged. There is calcification in the wall of the  thoracic aorta. The bony thorax exhibits no acute abnormality. IMPRESSION: Hyperinflation consistent with chronic bronchitis or reactive airway disease, stable. Cardiomegaly, pulmonary vascular congestion, and mild pulmonary interstitial edema consistent with CHF. Small bilateral pleural effusions more conspicuous than on the previous study. Thoracic aortic atherosclerosis. Electronically Signed   By: David  Martinique M.D.   On: 02/17/2017 13:14   Ct Angio Chest Pe W/cm &/or Wo Cm  Result Date: 02/17/2017 CLINICAL DATA:  Shortness of breath for 4 days, atrial fibrillation, no chest pain; history systolic CHF, hypertension EXAM: CT  ANGIOGRAPHY CHEST WITH CONTRAST TECHNIQUE: Multidetector CT imaging of the chest was performed using the standard protocol during bolus administration of intravenous contrast. Multiplanar CT image reconstructions and MIPs were obtained to evaluate the vascular anatomy. CONTRAST:  80 cc Isovue 370 IV COMPARISON:  None. FINDINGS: Cardiovascular: Atherosclerotic calcifications aorta and coronary arteries. Aneurysmal dilatation ascending thoracic aorta 4.8 cm transverse image 78 unchanged. No pericardial effusion. Dilatation of LEFT ventricle. Pulmonary arteries well opacified. Few scattered respiratory motion artifacts. Small filling defect identified within a posterior RIGHT lower lobe pulmonary artery compatible pulmonary embolism. No additional pulmonary emboli identified. No RIGHT ventricular dilatation. Mediastinum/Nodes: Large LEFT thyroid mass again identified 5.3 x 4.2 cm, measured 5.7 x 3.6 cm on 09/17/2015. No thoracic adenopathy. Esophagus unremarkable. Lungs/Pleura: Small BILATERAL pleural effusions with compressive atelectasis of the lower lobes. Central peribronchial thickening. No definite infiltrate or pneumothorax. Upper Abdomen: Pneumobilia unchanged. Remaining visualized upper abdomen unremarkable. Musculoskeletal: Diffuse osseous demineralization. Scattered degenerative  disc disease changes thoracic spine. Review of the MIP images confirms the above findings. IMPRESSION: Small pulmonary embolus in a RIGHT lower lobe pulmonary artery with small BILATERAL pleural effusions with compressive atelectasis of the posterior lower lobes. Stable LEFT thyroid mass. Coronary arterial calcification. Aneurysmal dilatation ascending thoracic aorta 4.8 cm transverse unchanged, recommendation below. Ascending thoracic aortic aneurysm. Recommend semi-annual imaging followup by CTA or MRA and referral to cardiothoracic surgery if not already obtained. This recommendation follows 2010 ACCF/AHA/AATS/ACR/ASA/SCA/SCAI/SIR/STS/SVM Guidelines for the Diagnosis and Management of Patients With Thoracic Aortic Disease. Circulation. 2010; 121: Q683-M196 Aortic Atherosclerosis (ICD10-I70.0). Aortic aneurysm NOS (ICD10-I71.9). Critical Value/emergent results were called by telephone at the time of interpretation on 02/17/2017 at 2:29 pm to Dr. Quintella Reichert , who verbally acknowledged these results. Electronically Signed   By: Lavonia Dana M.D.   On: 02/17/2017 14:29        Scheduled Meds: . diltiazem  60 mg Oral Q12H  . diltiazem  30 mg Oral Q6H  . furosemide  60 mg Oral Daily  . loratadine  10 mg Oral Daily  . pantoprazole  40 mg Oral Daily  . polyethylene glycol  17 g Oral QHS   Continuous Infusions: . sodium chloride 75 mL/hr at 02/18/17 0950  . heparin 1,500 Units/hr (02/18/17 0545)     LOS: 1 day    Time spent: 35 minutes.     Hosie Poisson, MD [facility] Pager [phone]   If 7PM-7AM, please contact night-coverage [email or web address] Password [email or web address] 02/18/2017, 3:55 PM

## 2017-02-18 NOTE — Progress Notes (Signed)
ANTICOAGULATION CONSULT NOTE - Follow Up Consult  Pharmacy Consult for Heparin  Indication: atrial fibrillation and pulmonary embolus  Allergies  Allergen Reactions  . Codeine Other (See Comments)    nervousness  . Simvastatin Other (See Comments)    Muscle pain/hurting  . Vicodin [Hydrocodone-Acetaminophen] Other (See Comments)    nervousness    Patient Measurements: Height: 5\' 10"  (177.8 cm) Weight: 198 lb 13.7 oz (90.2 kg) IBW/kg (Calculated) : 73  Vital Signs: Temp: 100.5 F (38.1 C) (06/19 2045) Temp Source: Oral (06/19 2045) BP: 89/62 (06/19 2045) Pulse Rate: 93 (06/19 2045)  Labs:  Recent Labs  02/17/17 1219 02/17/17 1819 02/17/17 2338  HGB 13.0  --   --   HCT 40.7  --   --   PLT 112*  --   --   HEPARINUNFRC  --   --  0.63  CREATININE 1.36*  --   --   TROPONINI  --  0.04* 0.04*    Estimated Creatinine Clearance: 47.3 mL/min (A) (by C-G formula based on SCr of 1.36 mg/dL (H)).  Assessment: 83 y/o M with afib and small PE on imaging, CBC good, plts 112, initial heparin level is therapeutic  Goal of Therapy:  Heparin level 0.3-0.7 units/ml Monitor platelets by anticoagulation protocol: Yes   Plan:  -Cont heparin at 1500 units/hr -Confirmatory HL with AM labs  Narda Bonds 02/18/2017,12:27 AM

## 2017-02-18 NOTE — Care Management Note (Signed)
Case Management Note Marvetta Gibbons RN, BSN Unit 2W-Case Manager [phone]  Patient Details  Name: Jimmy Myers MRN: [id number] Date of Birth: Oct 15, 1931  Subjective/Objective:   Pt admitted with PE                 Action/Plan: PTA pt lived at home with wife- insurance check completed on noacs- copay $45 for either- MD notified- spoke with pt at bedside- per pt he goes to the [facility] clinic- [place] PCP is Dr. Beckey Downing- [facility]. (pt is not 100% service connected)- contact at the [facility] clinic is Margaret-CSW at ext. [phone]- [phone]- pt states that he could afford $45 copay but would like to see if [facility] would approve which ever drug he is placed on- CM will plan to contact clinic and fax d/c summary along with script once available. Pt reports that he has already contacted the [facility]- pt also has Medicare coverage with [employer] and a PCP under that with  Dr. Wendi Snipes.  - CM will follow and provide pt 30 day free card to chosen drug for discharge.   Expected Discharge Date:                  Expected Discharge Plan:  Home/Self Care  In-House Referral:     Discharge planning Services  CM Consult, Medication Assistance  Post Acute Care Choice:    Choice offered to:     DME Arranged:    DME Agency:     HH Arranged:    HH Agency:     Status of Service:  In process, will continue to follow  If discussed at Long Length of Stay Meetings, dates discussed:    Discharge Disposition: home/self care   Additional Comments:  [name], RN 02/18/2017, 12:09 PM

## 2017-02-18 NOTE — Progress Notes (Addendum)
[place] for heparin  Indication: pulmonary embolus (h/o afib)  Allergies  Allergen Reactions  . Codeine Other (See Comments)    nervousness  . Simvastatin Other (See Comments)    Muscle pain/hurting  . Vicodin [Hydrocodone-Acetaminophen] Other (See Comments)    nervousness    Patient Measurements: Heparin Dosing Weight: 92 kg   Vital Signs: Temp: 98.6 F (37 C) (06/20 0441) Temp Source: Oral (06/20 0441) BP: 108/64 (06/20 0441) Pulse Rate: 97 (06/20 0441)  Labs:  Recent Labs  02/17/17 1219 02/17/17 1819 02/17/17 2338 02/18/17 0537  HGB 13.0  --   --  11.4*  HCT 40.7  --   --  35.5*  PLT 112*  --   --  83*  HEPARINUNFRC  --   --  0.63 0.60  CREATININE 1.36*  --   --  1.31*  TROPONINI  --  0.04* 0.04* 0.04*    Estimated Creatinine Clearance: 49.2 mL/min (A) (by C-G formula based on SCr of 1.31 mg/dL (H)).   Medical History: Past Medical History:  Diagnosis Date  . A-fib [facility]) May 2013   s/p TEE/cardioversion June 2013  . Anxiety   . Aortic root enlargement (HCC)    at least 5cm by CT  . Arthritis   . Atrial thrombus   . Bradycardia   . Bronchitis   . Cancer ([place]) 2016   lymphoma  . Chronic anticoagulation    on coumadin - checked in [place]  . Chronic systolic heart failure ([place]) 12/28/2011  . Congestive heart failure (CHF) ([place]) 2013  . Diverticulosis   . Dysrhythmia    AFib  . GERD (gastroesophageal reflux disease)   . Hemorrhoids   . HTN (hypertension)   . PE (pulmonary thromboembolism) ([place]) 01/2017  . Pneumonia May 2013  . Pneumonia 2013  . 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 Thrombocytopenia [facility])    Assessment: 83 yo male admitted with SOB. He has history of afib and atrial thrombus and is not on oral anticoagulation PTA (taken off in 10/2016 due to cancer  treatments per patient). Small RLL PE noted on imaging, no mention of RHS.   Heparin level remains therapeutic at 0.6. Hgb trend down 11.4 and platelets down to 83. No overt s/s bleeding noted.   Goal of Therapy:  Heparin level 0.3-0.7 units/ml Monitor platelets by anticoagulation protocol: Yes   Plan:  Continue heparin at 1500 units/h Daily heparin level and CBC - watch plt trend closely Monitor for s/s bleeding F/u long-term anticoagulation plan   Elicia Lamp, PharmD, BCPS Clinical Pharmacist Rx Phone # for today: [phone] After 3:30PM, please call Main Rx: [phone] 02/18/2017 8:54 AM   _________________________________________________________________ ADDENDUM:  Heparin to stop tonight at 1759 Apixaban 10mg  PO BID x 7 days; then 5mg  BID - start at 1800 Monitor renal function, CBC, and for s/sx of bleeding  Georga Bora, PharmD Clinical Pharmacist 02/18/2017 4:19 PM

## 2017-02-18 NOTE — Progress Notes (Signed)
Per insurance check for Noacs # 1. S/W  [facility] @ [place] # [phone]    1.XARELTO  20 DAILY   COVER- YES  CO-PAY- $ 45.00  TIER- 3 DRUG  PRIOR APPROVAL- NO   2. XARELTO 15 MG BID   COVER- YES  CO-PAY- $ 45.00  TIER- 3 DRUG  PRIOR APPROVAL- NO    3. ELIQUIS  5 MG BID   COVER- YES  CO-PAY- $ 45.00  TIER- 3 DRUG  PRIOR APPROVAL- NO   4. ELIQUIS 2.5 MG BID   COVER- YES  CO-PAY- $ 45.00  TIER- 3 DRUG  PRIOR APPROVAL- NO    PHARMACY : [facility] OR [facility]

## 2017-02-19 ENCOUNTER — Inpatient Hospital Stay (HOSPITAL_COMMUNITY): Payer: PPO

## 2017-02-19 DIAGNOSIS — M79609 Pain in unspecified limb: Secondary | ICD-10-CM

## 2017-02-19 DIAGNOSIS — M7989 Other specified soft tissue disorders: Secondary | ICD-10-CM

## 2017-02-19 DIAGNOSIS — I5042 Chronic combined systolic (congestive) and diastolic (congestive) heart failure: Secondary | ICD-10-CM

## 2017-02-19 LAB — BASIC METABOLIC PANEL
Anion gap: 6 (ref 5–15)
BUN: 29 mg/dL — AB (ref 6–20)
CO2: 25 mmol/L (ref 22–32)
Calcium: 8.3 mg/dL — ABNORMAL LOW (ref 8.9–10.3)
Chloride: 107 mmol/L (ref 101–111)
Creatinine, Ser: 1.46 mg/dL — ABNORMAL HIGH (ref 0.61–1.24)
GFR calc Af Amer: 50 mL/min — ABNORMAL LOW (ref >60)
GFR calc non Af Amer: 43 mL/min — ABNORMAL LOW (ref >60)
Glucose, Bld: 106 mg/dL — ABNORMAL HIGH (ref 65–99)
POTASSIUM: 4.3 mmol/L (ref 3.5–5.1)
Sodium: 138 mmol/L (ref 135–145)

## 2017-02-19 LAB — CBC
HEMATOCRIT: 34.6 % — AB (ref 39.0–52.0)
Hemoglobin: 11 g/dL — ABNORMAL LOW (ref 13.0–17.0)
MCH: 31.1 pg (ref 26.0–34.0)
MCHC: 31.8 g/dL (ref 30.0–36.0)
MCV: 97.7 fL (ref 78.0–100.0)
PLATELETS: 84 10*3/uL — AB (ref 150–400)
RBC: 3.54 MIL/uL — AB (ref 4.22–5.81)
RDW: 13.7 % (ref 11.5–15.5)
WBC: 5.3 10*3/uL (ref 4.0–10.5)

## 2017-02-19 LAB — ECHOCARDIOGRAM COMPLETE
Height: 70 in
WEIGHTICAEL: 3245.17 [oz_av]

## 2017-02-19 MED ORDER — FUROSEMIDE 10 MG/ML IJ SOLN
40.0000 mg | Freq: Two times a day (BID) | INTRAMUSCULAR | Status: DC
  Administered 2017-02-19 – 2017-02-21 (×5): 40 mg via INTRAVENOUS
  Filled 2017-02-19 (×5): qty 4

## 2017-02-19 MED ORDER — APIXABAN 5 MG PO TABS: 5.0000 mg | ORAL_TABLET | Freq: Two times a day (BID) | ORAL | Status: DC

## 2017-02-19 MED ORDER — PERFLUTREN LIPID MICROSPHERE
1.0000 mL | INTRAVENOUS | Status: AC | PRN
  Administered 2017-02-19: 2 mL via INTRAVENOUS
  Filled 2017-02-19: qty 10

## 2017-02-19 MED ORDER — APIXABAN 5 MG PO TABS
10.0000 mg | ORAL_TABLET | Freq: Two times a day (BID) | ORAL | Status: DC
  Administered 2017-02-19 – 2017-02-23 (×9): 10 mg via ORAL
  Filled 2017-02-19 (×9): qty 2

## 2017-02-19 NOTE — Progress Notes (Signed)
*  PRELIMINARY RESULTS* Vascular Ultrasound Right lower extremity venous duplex has been completed.  Preliminary findings: No evidence of DVT or baker's cyst.    Landry Mellow, RDMS, RVT  02/19/2017, 2:51 PM

## 2017-02-19 NOTE — Discharge Instructions (Signed)
Information on my medicine - ELIQUIS (apixaban)  This medication education was reviewed with me or my healthcare representative as part of my discharge preparation.  Why was Eliquis prescribed for you? Eliquis was prescribed to treat blood clots that may have been found in the veins of your legs (deep vein thrombosis) or in your lungs (pulmonary embolism) and to reduce the risk of them occurring again.  What do You need to know about Eliquis ? The starting dose is 10 mg (two 5 mg tablets) taken TWICE daily for the FIRST SEVEN (7) DAYS, then on (enter date)  02/26/2017  the dose is reduced to ONE 5 mg tablet taken TWICE daily.  Eliquis may be taken with or without food.   Try to take the dose about the same time in the morning and in the evening. If you have difficulty swallowing the tablet whole please discuss with your pharmacist how to take the medication safely.  Take Eliquis exactly as prescribed and DO NOT stop taking Eliquis without talking to the doctor who prescribed the medication.  Stopping may increase your risk of developing a new blood clot.  Refill your prescription before you run out.  After discharge, you should have regular check-up appointments with your healthcare provider that is prescribing your Eliquis.    What do you do if you miss a dose? If a dose of ELIQUIS is not taken at the scheduled time, take it as soon as possible on the same day and twice-daily administration should be resumed. The dose should not be doubled to make up for a missed dose.  Important Safety Information A possible side effect of Eliquis is bleeding. You should call your healthcare provider right away if you experience any of the following: ? Bleeding from an injury or your nose that does not stop. ? Unusual colored urine (red or dark brown) or unusual colored stools (red or black). ? Unusual bruising for unknown reasons. ? A serious fall or if you hit your head (even if there is no  bleeding).  Some medicines may interact with Eliquis and might increase your risk of bleeding or clotting while on Eliquis. To help avoid this, consult your healthcare provider or pharmacist prior to using any new prescription or non-prescription medications, including herbals, vitamins, non-steroidal anti-inflammatory drugs (NSAIDs) and supplements.  This website has more information on Eliquis (apixaban): http://www.eliquis.com/eliquis/home

## 2017-02-19 NOTE — Progress Notes (Signed)
  Echocardiogram 2D Echocardiogram has been performed.  Jimmy Myers 02/19/2017, 4:54 PM

## 2017-02-19 NOTE — Care Management Note (Signed)
Case Management Note Marvetta Gibbons RN, BSN Unit 2W-Case Manager [phone]  Patient Details  Name: Jimmy Myers MRN: [id number] Date of Birth: [birth year]  Subjective/Objective:   Pt admitted with PE                 Action/Plan: PTA pt lived at home with wife- insurance check completed on noacs- copay $45 for either- MD notified- spoke with pt at bedside- per pt he goes to the [facility] clinic- [place] PCP is Dr. Beckey Downing- [facility]. (pt is not 100% service connected)- contact at the [facility] clinic is Margaret-CSW at ext. [phone]- [phone]- pt states that he could afford $45 copay but would like to see if [facility] would approve which ever drug he is placed on- CM will plan to contact clinic and fax d/c summary along with script once available. Pt reports that he has already contacted the [facility]- pt also has Medicare coverage with [employer] and a PCP under that with  Dr. Wendi Snipes.  - CM will follow and provide pt 30 day free card to chosen drug for discharge.   Expected Discharge Date:                  Expected Discharge Plan:  Home/Self Care  In-House Referral:     Discharge planning Services  CM Consult, Medication Assistance  Post Acute Care Choice:    Choice offered to:     DME Arranged:    DME Agency:     HH Arranged:    HH Agency:     Status of Service:  In process, will continue to follow  If discussed at Long Length of Stay Meetings, dates discussed:    Discharge Disposition: home/self care   Additional Comments:  02/19/17- 1215- Marvetta Gibbons RN, CM- MD has started pt on Eliquis- 30 day free card given to pt at bedside to use on discharge- pt still request d/c summary to be sent to [facility] to start process to see if [place] might cover. Pt will plan to use 30 day to fill first mo. Please fax d/c summary to [phone]- see above.   [name], RN 02/19/2017, 12:40 PM

## 2017-02-19 NOTE — Progress Notes (Signed)
PROGRESS NOTE    Jimmy TALLON  Myers:[id number] DOB: 23-Sep-1931 DOA: 02/17/2017 PCP: [name], MD    Brief Narrative:  Jimmy Myers is a 83 y.o. male with medical history significant of a defibrillation, anxiety, atrial thrombus, lymphoma, CHF, GERD, hypertension, cholangiocarcinoma,  Patient presented with 3-4 day history of shortness of breath. on arrival to ED, he was found to have A fib with RVR and PE.   Assessment & Plan:   Active Problems:   HTN (hypertension), benign   Cholangiocarcinoma ([place])   Pulmonary embolism (HCC)   Chronic combined systolic (congestive) and diastolic (congestive) heart failure (HCC)   Acute respiratory failure (HCC)   Acute respiratory failure with hypoxia secondary to Pulmonary embolus and acute on chronic systolic and diastolic heart failure. Started him on IV heparin , transitioned to eliquis today.  Nasal canula oxygen as needed to keep sats greater than 90%.  Echocardiogram  Shows worsening of LVEF to 20%.  Today patient appears more sob and reports dyspneic on moving.  Cardiology consult will be called in am for further eval.  Strict intake and output.  Start IV lasix 40 mg BID.  CXR shows CHF with pleural effusions.     H/o Cholangiocarcinoma:  Follows up with Dr Irene Limbo. Outpatient follow up with Dr Irene Limbo.   Afib with RVR:  RATE better this am.  Initially started on IV Cardizem and transitioned to po Cardizem. TSH wnl.  Mildly elevated troponin's possibly from the afib with RVR.  On eliquis     Mild thrombocytopenia: slightly lower when compared to yesterday.  He has chronic thrombocytopenia, and platelets have been as low as 63.  Repeat cbc in am shows platelets around 80,000   Stage 2 CKD:  His baseline creatinine is 1.2, today is 1.4, continue to monitor creatinine.   Aneurysmal dilatation ascending thoracic aorta 4.8 cm transverse Unchanged Semi annual follow up with an [place].  Follow up with cardiothoracic surgery as  outpatient.     Mild normocytic anemia: baseline creatinine at 11.  Stable.         DVT prophylaxis: eliquis Code Status: (Full) Family Communication: none at bedside, discussed the plan of care with the patient.  Disposition Plan: (pending PT eval.   Consultants:   NONE.    Procedures: (ECHOCARDIOGRAM.    Antimicrobials: NONE.    Subjective: Reports sob today.   Objective: Vitals:   02/19/17 0533 02/19/17 0830 02/19/17 1323 02/19/17 1700  BP:  100/72 (!) 89/63 99/62  Pulse:  (!) 118 100 (!) 105  Resp:  (!) 25 18   Temp:  98 F (36.7 C) 97.7 F (36.5 C)   TempSrc:  Oral Oral   SpO2:  97% 99%   Weight: 92 kg (202 lb 13.2 oz)     Height:        Intake/Output Summary (Last 24 hours) at 02/19/17 1751 Last data filed at 02/19/17 1700  Gross per 24 hour  Intake          2134.42 ml  Output              980 ml  Net          1154.42 ml   Filed Weights   02/17/17 1624 02/18/17 0441 02/19/17 0533  Weight: 90.2 kg (198 lb 13.7 oz) 90.4 kg (199 lb 6.4 oz) 92 kg (202 lb 13.2 oz)    Examination:  General exam: Appears in mild distress from sob.  Respiratory system: diminished  at bases. No wheezing or rhonchi.  Cardiovascular system: S1 & S2 heard,irregular,   Gastrointestinal system: Abdomen is soft NT nd BS+ Central nervous system: Alert and oriented. No focal neurological deficits. Extremities: rigth foot and ankle swelling Skin: No rashes, lesions or ulcers Psychiatry: MOOD appropriate.     Data Reviewed: I have personally reviewed following labs and imaging studies  CBC:  Recent Labs Lab 02/17/17 1219 02/18/17 0537 02/19/17 0306  WBC 7.6 5.2 5.3  HGB 13.0 11.4* 11.0*  HCT 40.7 35.5* 34.6*  MCV 98.3 98.6 97.7  PLT 112* 83* 84*   Basic Metabolic Panel:  Recent Labs Lab 02/17/17 1219 02/17/17 1819 02/18/17 0537 02/19/17 0306  NA 139  --  138 138  K 4.6  --  3.8 4.3  CL 105  --  106 107  CO2 27  --  26 25  GLUCOSE 83  --  104* 106*    BUN 25*  --  26* 29*  CREATININE 1.36*  --  1.31* 1.46*  CALCIUM 9.1  --  8.2* 8.3*  MG  --  2.0  --   --    GFR: Estimated Creatinine Clearance: 44.5 mL/min (A) (by C-G formula based on SCr of 1.46 mg/dL (H)). Liver Function Tests:  Recent Labs Lab 02/18/17 0537  AST 27  ALT 38  ALKPHOS 112  BILITOT 1.3*  PROT 5.2*  ALBUMIN 3.0*   No results for input(s): LIPASE, AMYLASE in the last 168 hours. No results for input(s): AMMONIA in the last 168 hours. Coagulation Profile: No results for input(s): INR, PROTIME in the last 168 hours. Cardiac Enzymes:  Recent Labs Lab 02/17/17 1819 02/17/17 2338 02/18/17 0537  TROPONINI 0.04* 0.04* 0.04*   BNP (last 3 results)  Recent Labs  12/08/16 1238 12/23/16 0953 01/12/17 0927  PROBNP 3,713* 5,245* 4,653*   HbA1C: No results for input(s): HGBA1C in the last 72 hours. CBG: No results for input(s): GLUCAP in the last 168 hours. Lipid Profile: No results for input(s): CHOL, HDL, LDLCALC, TRIG, CHOLHDL, LDLDIRECT in the last 72 hours. Thyroid Function Tests:  Recent Labs  02/17/17 1819  TSH 0.904  FREET4 1.12   Anemia Panel: No results for input(s): VITAMINB12, FOLATE, FERRITIN, TIBC, IRON, RETICCTPCT in the last 72 hours. Sepsis Labs: No results for input(s): PROCALCITON, LATICACIDVEN in the last 168 hours.  No results found for this or any previous visit (from the past 240 hour(s)).       Radiology Studies: Dg Chest 2 View  Result Date: 02/19/2017 CLINICAL DATA:  Shortness of breath. Acute pulmonary embolism. History of atrial fibrillation, chronic CHF, cholangiocarcinoma. EXAM: CHEST  2 VIEW COMPARISON:  Chest x-ray and chest CT scan of February 17, 2017 FINDINGS: The lungs are well-expanded. The interstitial markings are more conspicuous today. The pulmonary vascularity remains engorged. The cardiac silhouette remains enlarged. There small bilateral pleural effusions which are stable. There is calcification in the  wall of the thoracic aorta. There is multilevel degenerative disc disease of the thoracic spine. IMPRESSION: CHF with mild interstitial edema and small bilateral pleural effusions. No acute pneumonia. Thoracic aortic atherosclerosis. Electronically Signed   By: David  Martinique M.D.   On: 02/19/2017 08:58        Scheduled Meds: . apixaban  10 mg Oral BID   Followed by  . [START ON 02/26/2017] apixaban  5 mg Oral BID  . diltiazem  60 mg Oral Q12H  . diltiazem  30 mg Oral Q6H  . furosemide  40 mg Intravenous Q12H  . loratadine  10 mg Oral Daily  . pantoprazole  40 mg Oral Daily  . polyethylene glycol  17 g Oral QHS   Continuous Infusions:    LOS: 2 days    Time spent: 35 minutes.     Hosie Poisson, MD [facility] Pager [phone]   If 7PM-7AM, please contact night-coverage [email or web address] Password [facility] 02/19/2017, 5:51 PM

## 2017-02-19 NOTE — Progress Notes (Addendum)
Patient on both Cardizem 30 mg q 6 hrs and Cardizem 60 mg E.R.q 12 hrs. Dr Myna Hidalgo made aware and order to [facility]. Cardizem 30 mg q 6 hrs. Pharm. Also aware and D.C. Cardizem 30 mg Q 6 hrs.Sharrie Rothman R.N. Aware.

## 2017-02-20 ENCOUNTER — Encounter: Payer: Self-pay | Admitting: Nurse Practitioner

## 2017-02-20 DIAGNOSIS — C221 Intrahepatic bile duct carcinoma: Secondary | ICD-10-CM

## 2017-02-20 DIAGNOSIS — I2699 Other pulmonary embolism without acute cor pulmonale: Principal | ICD-10-CM

## 2017-02-20 DIAGNOSIS — I5042 Chronic combined systolic (congestive) and diastolic (congestive) heart failure: Secondary | ICD-10-CM

## 2017-02-20 DIAGNOSIS — I712 Thoracic aortic aneurysm, without rupture: Secondary | ICD-10-CM

## 2017-02-20 DIAGNOSIS — J9600 Acute respiratory failure, unspecified whether with hypoxia or hypercapnia: Secondary | ICD-10-CM

## 2017-02-20 LAB — CBC
HEMATOCRIT: 33.5 % — AB (ref 39.0–52.0)
Hemoglobin: 10.8 g/dL — ABNORMAL LOW (ref 13.0–17.0)
MCH: 31.1 pg (ref 26.0–34.0)
MCHC: 32.2 g/dL (ref 30.0–36.0)
MCV: 96.5 fL (ref 78.0–100.0)
PLATELETS: 89 10*3/uL — AB (ref 150–400)
RBC: 3.47 MIL/uL — AB (ref 4.22–5.81)
RDW: 13.7 % (ref 11.5–15.5)
WBC: 4.6 10*3/uL (ref 4.0–10.5)

## 2017-02-20 LAB — BASIC METABOLIC PANEL
ANION GAP: 8 (ref 5–15)
BUN: 29 mg/dL — ABNORMAL HIGH (ref 6–20)
CO2: 26 mmol/L (ref 22–32)
Calcium: 8.6 mg/dL — ABNORMAL LOW (ref 8.9–10.3)
Chloride: 106 mmol/L (ref 101–111)
Creatinine, Ser: 1.43 mg/dL — ABNORMAL HIGH (ref 0.61–1.24)
GFR, EST AFRICAN AMERICAN: 51 mL/min — AB (ref >60)
GFR, EST NON AFRICAN AMERICAN: 44 mL/min — AB (ref >60)
Glucose, Bld: 114 mg/dL — ABNORMAL HIGH (ref 65–99)
POTASSIUM: 3.9 mmol/L (ref 3.5–5.1)
Sodium: 140 mmol/L (ref 135–145)

## 2017-02-20 MED ORDER — CARVEDILOL 6.25 MG PO TABS
6.2500 mg | ORAL_TABLET | Freq: Two times a day (BID) | ORAL | Status: DC
  Administered 2017-02-20 – 2017-02-21 (×2): 6.25 mg via ORAL
  Filled 2017-02-20 (×2): qty 1

## 2017-02-20 MED ORDER — DILTIAZEM HCL ER 90 MG PO CP12
90.0000 mg | ORAL_CAPSULE | Freq: Two times a day (BID) | ORAL | Status: DC
  Filled 2017-02-20: qty 1

## 2017-02-20 MED ORDER — APIXABAN 5 MG PO TABS: 10.0000 mg | ORAL_TABLET | Freq: Two times a day (BID) | ORAL | 0 refills | Status: DC

## 2017-02-20 MED ORDER — APIXABAN 5 MG PO TABS: 5.0000 mg | ORAL_TABLET | Freq: Two times a day (BID) | ORAL | 0 refills | Status: DC

## 2017-02-20 NOTE — Consult Note (Signed)
Patient ID: [name] MRN: [id number], DOB/AGE: [id number]   Admit date: 02/17/2017  Reason for Consult: Acute Systolic CHF Requesting Physician: Dr. Karleen Hampshire, Internal Medicine    Primary Physician: [name], MD Primary Cardiologist: Dr. [name] / Truitt Merle, NP   Pt. Profile: Jimmy Myers is a 83 y.o. male with a h/o systolic HF and chronic atrial fibrillation, who is being seen for the evaluation of acute on chronic systolic HF, at the request of Dr, Karleen Hampshire, Internal Medicine. Also with newly diagnosed right sided PE that is being treated.  Problem List  Past Medical History:  Diagnosis Date  . A-fib [facility]) May 2013   s/p TEE/cardioversion June 2013  . Anxiety   . Aortic root enlargement (HCC)    at least 5cm by CT  . Arthritis   . Atrial thrombus   . Bradycardia   . Bronchitis   . Cancer ([place]) 2016   lymphoma  . Chronic anticoagulation    on coumadin - checked in [place]  . Chronic systolic heart failure ([place]) 12/28/2011  . Congestive heart failure (CHF) ([place]) 2013  . Diverticulosis   . Dysrhythmia    AFib  . GERD (gastroesophageal reflux disease)   . Hemorrhoids   . HTN (hypertension)   . PE (pulmonary thromboembolism) ([place]) 01/2017  . Pneumonia May 2013  . Pneumonia 2013  . 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 Thrombocytopenia ([place])     Past Surgical History:  Procedure Laterality Date  . CARDIOVERSION  12/30/2011   Procedure: CARDIOVERSION;  Surgeon: Lelon Perla, MD;  Location: [facility] ENDOSCOPY;  Service: Cardiovascular;  Laterality: N/A;  . CARDIOVERSION  02/16/2012   Procedure: CARDIOVERSION;  Surgeon: Lelon Perla, MD;  Location: [facility] ENDOSCOPY;  Service: Cardiovascular;  Laterality: N/A;  . CATARACT EXTRACTION W/PHACO Left 07/28/2016   Procedure: CATARACT EXTRACTION PHACO AND INTRAOCULAR LENS PLACEMENT  LEFT EYE:  CDE: 13.00;  Surgeon: Tonny Branch, MD;  Location: [facility] ORS;  Service: Ophthalmology;  Laterality: Left;  . ENDOSCOPIC RETROGRADE CHOLANGIOPANCREATOGRAPHY (ERCP) WITH PROPOFOL N/A 10/24/2016   Procedure: ENDOSCOPIC RETROGRADE CHOLANGIOPANCREATOGRAPHY (ERCP) WITH PROPOFOL;  Surgeon: Doran Stabler, MD;  Location: [facility] ENDOSCOPY;  Service: Endoscopy;  Laterality: N/A;  . ESOPHAGOGASTRODUODENOSCOPY  12/30/2011   Procedure: ESOPHAGOGASTRODUODENOSCOPY (EGD);  Surgeon: Lelon Perla, MD;  Location: [facility] ENDOSCOPY;  Service: Cardiovascular;  Laterality: N/A;  . ESOPHAGOGASTRODUODENOSCOPY  12/30/2011   Procedure: ESOPHAGOGASTRODUODENOSCOPY (EGD);  Surgeon: Lafayette Dragon, MD;  Location: [facility] ENDOSCOPY;  Service: Endoscopy;  Laterality: N/A;  . IR BILIARY STENT(S) EXISTING ACCESS INC DILATION CATH EXCHANGE  12/16/2016  . IR GENERIC HISTORICAL  10/25/2016   IR INT EXT BILIARY DRAIN WITH CHOLANGIOGRAM 10/25/2016 Greggory Keen, MD [facility]-INTERV RAD  . IR GENERIC HISTORICAL  10/28/2016   IR EXCHANGE BILIARY DRAIN 10/28/2016 Greggory Keen, MD [facility]-INTER[facility] RAD  . IR GENERIC HISTORICAL  10/28/2016   IR ENDOLUMINAL BX OF BILIARY TREE 10/28/2016 Greggory Keen, MD [facility]-INTER[facility] RAD  . IR GENERIC HISTORICAL  11/21/2016   IR EXCHANGE BILIARY DRAIN 11/21/2016 Corrie Mckusick, DO [facility]-INTERV RAD  . IR RADIOLOGIST EVAL & MGMT  12/03/2016  . IR RADIOLOGIST EVAL & MGMT  11/11/2016  . IR REMOVAL BILIARY DRAIN  12/30/2016  . KNEE ARTHROSCOPY  rt knee  . PROSTATE SURGERY    . TEE WITHOUT CARDIOVERSION  12/30/2011   Procedure: TRANSESOPHAGEAL ECHOCARDIOGRAM (TEE);  Surgeon: Lelon Perla, MD;  Location: [facility] ENDOSCOPY;  Service: Cardiovascular;  Laterality: N/A;  . TEE WITHOUT CARDIOVERSION  02/16/2012   Procedure: TRANSESOPHAGEAL ECHOCARDIOGRAM (TEE);  Surgeon: Lelon Perla, MD;  Location: [facility] ENDOSCOPY;  Service: Cardiovascular;  Laterality: N/A;     Allergies  Allergies  Allergen Reactions  . Codeine Other (See Comments)    nervousness   . Simvastatin Other (See Comments)    Muscle pain/hurting  . Vicodin [Hydrocodone-Acetaminophen] Other (See Comments)    nervousness    HPI  Jimmy Myers is a 83 y.o. male with a h/o systolic HF and chronic atrial fibrillation, who is being seen for the evaluation of acute on chronic systolic HF, at the request of Dr, Karleen Hampshire, Internal Medicine. Also with newly diagnosed right sided PE that is being treated.  He has a h/o combined systolic and diastolic heart failure with an EF previously down to 20 to 25% with improvement after medical therapy - up to 50% per echo in August of 2013, followed by 40-45% on echo in 2017 . His other issues include chronic atrial fib with prior cardioversion, prior h/o an atrial thrombus in 2013, bradycardia, enlarged ascending aorta (previously followed by Dr. [name], however per records, pt has opted to no longer have this followed), thyroid nodules (with benign biopsy), chronic thrombocytopenia and HTN. He was evaluated with an event monitor in 2014 - average HR of 61. 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 He was on Coumadin in the past but it was discontinued (Pt was admitted 10/2016 with progressively worsening jaundice. ERCP showed severe biliary stricture. Liver enzymes were elevated and he had issues with supra therapeutic INRs with coumadin, up to the 11 range. He required Vit. K for reversal. Coumadin was discontinued. He was ultimately diagnosed with Cholangiocarcinoma. This is being treated with radiation therapy only. No chemo. There is no documented coronary disease. No h/o cardiac catheterizations, however he reports that he has had 2 stress test that have been normal. I do not see this in his electronic chart.   He has been followed by Dr. [name] in the past, but most recent office visits over the last several years have been  with Truitt Merle, NP. His last OV was 11/2016. It was noted that his BB had recently been discontinued due to bradycardia.   He presented to the [facility] ED on 02/17/17 with complaint of SOB and palpitations. Symptoms have been present for the past 3-4 days. Gradual onset. Also with associated right LEE. Also with dry cough. No associated CP, fever, chills, n/v. He tried increasing his Lasix at home, w/o improvement, prompting him to come to the ED.   On arrival, he was noted to be in afib w/ RVR and stared on IV dilt. Troponin's minimally elevated with flat low level trend, c/w demand ischemia. K, Mg and TSH all WNL.  CT angio of the chest showed Small pulmonary embolus in a RIGHT lower lobe pulmonary artery with small BILATERAL pleural effusions with compressive atelectasis of the posterior lower lobes. Also aneurysmal dilatation of the ascending thoracic aorta, measuring at 4.8 cm (4.5 cm in 2017).Bilateral LE venous dopplers were negative for DVT. Echo was obtained, which showed no RV strain. The cavity size is mildly dilated with normal wall thickness and RV systolic  function. He was however noted to have reduction in EF back down to 20-25% with severe diffuse hypokinesis with regional variations. The LA is moderately to severely dilated. No significant valve abnormalities.   He was admitted by IM and placed on Eliquis for his PE. His rate is improved and he has been transitioned off of IV dilt and now on PO, 60 mg Q12hr. He is on IV lasix for diuresis. BNP was elevated at 596. He has diuresed 2.4L in the past 24 hrs. He has mild renal insuffiencey with SCr at 1.43 and BUN at 29. He is anemic with Hgb at 10.8.  Cardiology consulted for assistance with his systolic HF. He is not on medical therapy for HF currently. No BB or ARB. BB was previously discontinued outpatient given bradycardia, however recently required rate control agent>>cardizem.   Home Medications  Prior to Admission medications     Medication Sig Start Date End Date Taking? Authorizing Provider  acetaminophen (TYLENOL) 325 MG tablet Take 325 mg by mouth every 6 (six) hours as needed.   Yes [provider]  albuterol (PROVENTIL HFA;VENTOLIN HFA) 108 (90 BASE) MCG/ACT inhaler Inhale 2 puffs into the lungs every 6 (six) hours as needed for wheezing or shortness of breath.   Yes [provider]  clorazepate (TRANXENE) 3.75 MG tablet Give .05 tablet by mouth daily as needed for anxiety   Yes [provider]  fluticasone (FLONASE) 50 MCG/ACT nasal spray Place 1 spray into both nostrils daily as needed for allergies or rhinitis.   Yes [provider]  folic acid (FOLVITE) 623 MCG tablet Take 800 mcg by mouth daily.   Yes [provider]  furosemide (LASIX) 40 MG tablet Take 60 mg by mouth daily.   Yes [provider]  nitroGLYCERIN (NITROSTAT) 0.4 MG SL tablet Place 1 tablet (0.4 mg total) under the tongue every 5 (five) minutes x 3 doses as needed for chest pain. 05/15/14 10/06/17 Yes Burtis Junes, NP  omeprazole (PRILOSEC) 20 MG capsule Take 20 mg by mouth daily before breakfast.    Yes [provider]  polyethylene glycol (MIRALAX / GLYCOLAX) packet Take 17 g by mouth at bedtime as needed.    Yes [provider]  potassium chloride (K-DUR) 10 MEQ tablet Take 1 tablet (10 mEq total) by mouth daily. 12/24/16  Yes Burtis Junes, NP  furosemide (LASIX) 40 MG tablet TAKE ONE TABLET BY MOUTH ONCE DAILY Patient not taking: Reported on 02/17/2017 09/26/16   Burtis Junes, NP  traZODone (DESYREL) 50 MG tablet Take 1-2 tablets (50-100 mg total) by mouth at bedtime as needed for sleep. Patient not taking: Reported on 02/17/2017 01/27/17   [name], MD    Hospital Medications  . apixaban  10 mg Oral BID   Followed by  . [START ON 02/26/2017] apixaban  5 mg Oral BID  . diltiazem  60 mg Oral Q12H  . furosemide  40 mg Intravenous Q12H  . loratadine  10 mg  Oral Daily  . pantoprazole  40 mg Oral Daily  . polyethylene glycol  17 g Oral QHS    acetaminophen **OR** acetaminophen, albuterol, clorazepate, fluticasone, promethazine  Family History  Family History  Problem Relation Age of Onset  . Heart disease Mother   . Heart disease Father   . Stomach cancer Brother   . Heart disease Sister   . Heart disease Brother   . Hypertension Sister     Social History  Social History   Social History  . Marital status: Married    Spouse name: N/A  . Number of children: 2  . Years of education: N/A   Occupational History  . retired Retired   Social History Main Topics  . Smoking status: Never Smoker  . Smokeless tobacco: Never Used  . Alcohol use No  . Drug use: No  . Sexual activity: Not Currently    Birth control/ protection: None   Other Topics Concern  . Not on file   Social History Narrative  . No narrative on file     Review of Systems General:  No chills, fever, night sweats or weight changes.  Cardiovascular:  No chest pain, dyspnea on exertion, edema, orthopnea, palpitations, paroxysmal nocturnal dyspnea. Dermatological: No rash, lesions/masses Respiratory: No cough, dyspnea Urologic: No hematuria, dysuria Abdominal:   No nausea, vomiting, diarrhea, bright red blood per rectum, melena, or hematemesis Neurologic:  No visual changes, wkns, changes in mental status. All other systems reviewed and are otherwise negative except as noted above.  Physical Exam  Blood pressure 100/72, pulse 100, temperature 98.4 F (36.9 C), temperature source Oral, resp. rate 18, height 5\' 10"  (1.778 m), weight 199 lb 12.8 oz (90.6 kg), SpO2 97 %.  General: Pleasant, NAD, elderly  Psych: Normal affect. Neuro: Alert and oriented X 3. Moves all extremities spontaneously. HEENT: Normal  Neck: Supple without bruits, + JVD Lungs:  Resp regular and unlabored, CTA. Heart: irregular irregular rhythm, regular rate, no s3, s4, or  murmurs. Abdomen: Soft, non-tender, non-distended, BS + x 4.  Extremities: No clubbing, cyanosis or edema. DP/PT/Radials 2+ and equal bilaterally.  Labs  Troponin (Point of Care Test) No results for input(s): TROPIPOC in the last 72 hours.  Recent Labs  02/17/17 1819 02/17/17 2338 02/18/17 0537  TROPONINI 0.04* 0.04* 0.04*   Lab Results  Component Value Date   WBC 4.6 02/20/2017   HGB 10.8 (L) 02/20/2017   HCT 33.5 (L) 02/20/2017   MCV 96.5 02/20/2017   PLT 89 (L) 02/20/2017    Recent Labs Lab 02/18/17 0537  02/20/17 1055  NA 138  < > 140  K 3.8  < > 3.9  CL 106  < > 106  CO2 26  < > 26  BUN 26*  < > 29*  CREATININE 1.31*  < > 1.43*  CALCIUM 8.2*  < > 8.6*  PROT 5.2*  --   --   BILITOT 1.3*  --   --   ALKPHOS 112  --   --   ALT 38  --   --   AST 27  --   --   GLUCOSE 104*  < > 114*  < > = values in this interval not displayed. Lab Results  Component Value Date   CHOL 85 12/29/2011   HDL 40 12/29/2011   LDLCALC 39 12/29/2011   TRIG 30 12/29/2011   No results found for: DDIMER   Radiology/Studies  Dg Chest 2 View  Result Date: 02/19/2017 CLINICAL DATA:  Shortness of breath. Acute pulmonary embolism. History of atrial fibrillation, chronic CHF, cholangiocarcinoma. EXAM: CHEST  2 VIEW COMPARISON:  Chest x-ray and chest CT scan of February 17, 2017 FINDINGS: The lungs are well-expanded. The interstitial markings are more conspicuous today. The pulmonary vascularity remains engorged. The cardiac silhouette remains enlarged. There small bilateral pleural effusions which are stable. There is calcification in the wall of the thoracic aorta. There is multilevel degenerative disc disease of the thoracic  spine. IMPRESSION: CHF with mild interstitial edema and small bilateral pleural effusions. No acute pneumonia. Thoracic aortic atherosclerosis. Electronically Signed   By: David  Martinique M.D.   On: 02/19/2017 08:58   Dg Chest 2 View  Result Date: 02/17/2017 CLINICAL DATA:   3-4 days of shortness of breath. No chest pain. History of CHF, atrial fibrillation. EXAM: CHEST  2 VIEW COMPARISON:  Chest CT scan of October 31, 2016 and chest x-ray of Jan 06, 2012. FINDINGS: The lungs remain mildly hyperinflated. There are small bilateral pleural effusions layering posteriorly which are slightly more conspicuous than on the previous study. The cardiac silhouette is enlarged. The pulmonary vascularity is engorged. There is calcification in the wall of the thoracic aorta. The bony thorax exhibits no acute abnormality. IMPRESSION: Hyperinflation consistent with chronic bronchitis or reactive airway disease, stable. Cardiomegaly, pulmonary vascular congestion, and mild pulmonary interstitial edema consistent with CHF. Small bilateral pleural effusions more conspicuous than on the previous study. Thoracic aortic atherosclerosis. Electronically Signed   By: David  Martinique M.D.   On: 02/17/2017 13:14   Ct Angio Chest Pe W/cm &/or Wo Cm  Result Date: 02/17/2017 CLINICAL DATA:  Shortness of breath for 4 days, atrial fibrillation, no chest pain; history systolic CHF, hypertension EXAM: CT ANGIOGRAPHY CHEST WITH CONTRAST TECHNIQUE: Multidetector CT imaging of the chest was performed using the standard protocol during bolus administration of intravenous contrast. Multiplanar CT image reconstructions and MIPs were obtained to evaluate the vascular anatomy. CONTRAST:  80 cc Isovue 370 IV COMPARISON:  None. FINDINGS: Cardiovascular: Atherosclerotic calcifications aorta and coronary arteries. Aneurysmal dilatation ascending thoracic aorta 4.8 cm transverse image 78 unchanged. No pericardial effusion. Dilatation of LEFT ventricle. Pulmonary arteries well opacified. Few scattered respiratory motion artifacts. Small filling defect identified within a posterior RIGHT lower lobe pulmonary artery compatible pulmonary embolism. No additional pulmonary emboli identified. No RIGHT ventricular dilatation.  Mediastinum/Nodes: Large LEFT thyroid mass again identified 5.3 x 4.2 cm, measured 5.7 x 3.6 cm on 09/17/2015. No thoracic adenopathy. Esophagus unremarkable. Lungs/Pleura: Small BILATERAL pleural effusions with compressive atelectasis of the lower lobes. Central peribronchial thickening. No definite infiltrate or pneumothorax. Upper Abdomen: Pneumobilia unchanged. Remaining visualized upper abdomen unremarkable. Musculoskeletal: Diffuse osseous demineralization. Scattered degenerative disc disease changes thoracic spine. Review of the MIP images confirms the above findings. IMPRESSION: Small pulmonary embolus in a RIGHT lower lobe pulmonary artery with small BILATERAL pleural effusions with compressive atelectasis of the posterior lower lobes. Stable LEFT thyroid mass. Coronary arterial calcification. Aneurysmal dilatation ascending thoracic aorta 4.8 cm transverse unchanged, recommendation below. Ascending thoracic aortic aneurysm. Recommend semi-annual imaging followup by CTA or MRA and referral to cardiothoracic surgery if not already obtained. This recommendation follows 2010 ACCF/AHA/AATS/ACR/ASA/SCA/SCAI/SIR/STS/SVM Guidelines for the Diagnosis and Management of Patients With Thoracic Aortic Disease. Circulation. 2010; 121: G956-O130 Aortic Atherosclerosis (ICD10-I70.0). Aortic aneurysm NOS (ICD10-I71.9). Critical Value/emergent results were called by telephone at the time of interpretation on 02/17/2017 at 2:29 pm to Dr. Quintella Reichert , who verbally acknowledged these results. Electronically Signed   By: Lavonia Dana M.D.   On: 02/17/2017 14:29    ECG  Atrial fibrillation with RVR, 128 bpm  -- personally reviewed  Telemetry  Atrial fibrillation w/ CVR in the 90s -- personally reviewed   Echocardiogram 2D echo 02/19/17  Study Conclusions  - Left ventricle: The cavity size was severely dilated. Systolic   function was severely reduced. The estimated ejection fraction   was in the range of 20%  to 25%. Severe diffuse hypokinesis with  regional variations. The study was not technically sufficient to   allow evaluation of LV diastolic dysfunction due to atrial   fibrillation. - Aortic valve: Moderately to severely calcified annulus.   Trileaflet; mildly thickened, moderately calcified leaflets.   Valve area (VTI): 1.92 cm^2. Valve area (Vmax): 1.77 cm^2. Valve   area (Vmean): 2.15 cm^2. - Aorta: Aortic root dimension: 50 mm (ED). - Aortic root: The aortic root was severely dilated. - Left atrium: The atrium was moderately to severely dilated. - Right ventricle: The cavity size was mildly dilated. Wall   thickness was normal. - Pulmonary arteries: PA peak pressure: 34 mm Hg (S).  ASSESSMENT AND PLAN  Active Problems:   HTN (hypertension), benign   Cholangiocarcinoma ([place])   Pulmonary embolism (HCC)   Chronic combined systolic (congestive) and diastolic (congestive) heart failure (HCC)   Acute respiratory failure ([place])   1. Acute Systolic HF: prior h/o chronic systolic HF with EF previously down to 20 to 25% with improvement after medical therapy - up to 50% per echo in August of 2013, followed by 40-45% on echo in 2017. Now with recurrent systolic HF with echo showing current EF at 20-25%. BNP this admit elevated at 594. CXR with bilateral plural effusion. Being treated with IV lasix with good diuretic response. Still with JVD on exam. Continue until euvolemic. Monitor renal function, electrolytes and BP. Strict I/Os. He is currently not on any other medications for HF. His BB had been discontinued previously due to bradycardia. He has required re-iniatiation of rate control therapy, this admission, given atrial fibrillation w/ RVR. He was placed on PO Cardizem. Given its negative  Inotropic effects, we recommend avoidance of Cardizem in the setting of severe LV dysfunction. Stop Cardizem and try low dose BB, Coreg BID, and monitor HR and BP closely. Given his renal insuffiencey, we  will need to avoid ACE/ARB/Enterso for now. He has no room in BP to allow for initiation of nitrates and hydralazine at this time.  2. Pulmonary Embolism: in the setting of malignancy and hypercoagulable state. Found on CT scan 02/19/17. He is hemodynamically stable. 2D echo shows no RV strain.  Bilateral LE venous dopplers negative for DVT.  He has been started on Eliquis, at appropriate treatment dose.   3. Chronic Atrial Fibrillation w/ RVR: rapid ventricular response, likely triggered by underlying PE. His rate is better controlled after initiation of AV nodal blocking agent, however given his severe LV dysfunction, recommend avoidance of Cardizem and switch to BB. Continue to monitor on telemetry. K, Mg, and TSH WNL. He is now on Eliquis for a/c, at treatment dose for acute PE.   4. Cholangiocarcinoma: treated with radiation only. He has had no chemo. Management per primary team and oncology.   5. HTN: soft but stable. Monitor closely in the setting of diuresis.   6. Renal Insufficiency: Scr 1.43/ BUN 29. Baseline is ~1.1-1.2. Monitor closely in the setting of diuresis.   7. Ascending Thoracic Aortic Dilatation:  (previously followed by Dr. [name], however per records, pt has opted to no longer have this followed). CT of chest this admit, shows aneurysm measuring at 4.8 cm (previously 4.5 cm in 2017).   8. Elevated Troponin: flat, low level trend, at 0.04>>0.04>>0.04. Not c/w ACS. Likely demand ischemia from acute PE and acute systolic CHF. He has had no anginal symptoms. No ischemic w/u indicated.    Signed, Lyda Jester, PA-C, MHS 02/20/2017, 1:43 PM [facility] Pager: [phone]   I have examined the  patient and reviewed assessment and plan and discussed with patient.  Agree with above as stated.  He needs anticoagulation for PE and AFib.  He needs diuresis and systolic heart failure management.  Change diltiazem to coreg for LV dysfunction.  Uptitrate as needed for rate  control.  He is still quite short of breath.  Diuresis should help.  COntinue IV Lasix for now.  Watch renal function.  Larae Grooms

## 2017-02-20 NOTE — Progress Notes (Signed)
PROGRESS NOTE    CHRISTYAN REGER  IRC:[id number] DOB: 08-01-1932 DOA: 02/17/2017 PCP: [name], MD    Brief Narrative:  Jimmy Myers is a 83 y.o. male with medical history significant of a defibrillation, anxiety, atrial thrombus, lymphoma, CHF, GERD, hypertension, cholangiocarcinoma,  Patient presented with 3-4 day history of shortness of breath. on arrival to ED, he was found to have A fib with RVR and PE.   Assessment & Plan:   Active Problems:   HTN (hypertension), benign   Cholangiocarcinoma ([place])   Pulmonary embolism (HCC)   Chronic combined systolic (congestive) and diastolic (congestive) heart failure (HCC)   Acute respiratory failure (HCC)   Acute respiratory failure with hypoxia secondary to Pulmonary embolus and acute on chronic systolic and diastolic heart failure. Started him on IV heparin , transitioned to eliquis.  Nasal canula oxygen as needed to keep sats greater than 90%.  Echocardiogram  Shows worsening of LVEF to 20%.  Strict intake and output.  Start IV lasix 40 mg BID.  CXR shows CHF with pleural effusions.  Still sob, but slightly better than yesterday. diuresed about 4 lit since yesterday.  Cardiology consulted     H/o Cholangiocarcinoma:  Follows up with Dr Irene Limbo. Outpatient follow up with Dr Irene Limbo.   Afib with RVR:  RATE is between 90 to 130. cardizem stopped as his EF IS 20%. Changed to coregTSH wnl.  Mildly elevated troponin's possibly from the afib with RVR.  On eliquis     Mild thrombocytopenia: slightly lower when compared to yesterday.  He has chronic thrombocytopenia, and platelets have been as low as 63.  Repeat cbc in am shows platelets around 80,000   Stage 2 CKD:  His baseline creatinine is 1.2, today is 1.4, continue to monitor creatinine on IV lasix.Marland Kitchen   Aneurysmal dilatation ascending thoracic aorta 4.8 cm transverse Unchanged Semi annual follow up with an [place].  Follow up with cardiothoracic surgery as outpatient.      Mild normocytic anemia: baseline creatinine at 11.  Stable.         DVT prophylaxis: eliquis Code Status: (Full) Family Communication: family at bedside, discussed the plan of care with the patient.  Disposition Plan: pending PT eval.   Consultants:   NONE.    Procedures: ECHOCARDIOGRAM.    Antimicrobials: NONE.    Subjective: Sob improved but not back to baseline. He is dyspneic on talking.   Objective: Vitals:   02/19/17 1925 02/19/17 2150 02/20/17 0519 02/20/17 0951  BP: 99/71 101/77 (!) 117/58 100/72  Pulse: 79 (!) 103 (!) 58 100  Resp: 18  18   Temp: 98.8 F (37.1 C)  98.4 F (36.9 C)   TempSrc: Oral  Oral   SpO2: 99%  98% 97%  Weight:   90.6 kg (199 lb 12.8 oz)   Height:        Intake/Output Summary (Last 24 hours) at 02/20/17 1514 Last data filed at 02/20/17 1242  Gross per 24 hour  Intake              600 ml  Output             2720 ml  Net            -2120 ml   Filed Weights   02/18/17 0441 02/19/17 0533 02/20/17 0519  Weight: 90.4 kg (199 lb 6.4 oz) 92 kg (202 lb 13.2 oz) 90.6 kg (199 lb 12.8 oz)    Examination:  General exam:  Appears in distress from sob.  Respiratory system: diminished at bases. No wheezing or rhonchi.  Cardiovascular system: S1 & S2 heard,irregular,  Rate in 90/min.  Gastrointestinal system: Abdomen is soft NT nd BS+ Central nervous system: Alert and orientedt o place and person. Non focal.  Extremities: bilateral feet swollen.  Skin: No rashes, lesions or ulcers Psychiatry: MOOD appropriate.     Data Reviewed: I have personally reviewed following labs and imaging studies  CBC:  Recent Labs Lab 02/17/17 1219 02/18/17 0537 02/19/17 0306 02/20/17 0238  WBC 7.6 5.2 5.3 4.6  HGB 13.0 11.4* 11.0* 10.8*  HCT 40.7 35.5* 34.6* 33.5*  MCV 98.3 98.6 97.7 96.5  PLT 112* 83* 84* 89*   Basic Metabolic Panel:  Recent Labs Lab 02/17/17 1219 02/17/17 1819 02/18/17 0537 02/19/17 0306 02/20/17 1055  NA  139  --  138 138 140  K 4.6  --  3.8 4.3 3.9  CL 105  --  106 107 106  CO2 27  --  26 25 26   GLUCOSE 83  --  104* 106* 114*  BUN 25*  --  26* 29* 29*  CREATININE 1.36*  --  1.31* 1.46* 1.43*  CALCIUM 9.1  --  8.2* 8.3* 8.6*  MG  --  2.0  --   --   --    GFR: Estimated Creatinine Clearance: 45.1 mL/min (A) (by C-G formula based on SCr of 1.43 mg/dL (H)). Liver Function Tests:  Recent Labs Lab 02/18/17 0537  AST 27  ALT 38  ALKPHOS 112  BILITOT 1.3*  PROT 5.2*  ALBUMIN 3.0*   No results for input(s): LIPASE, AMYLASE in the last 168 hours. No results for input(s): AMMONIA in the last 168 hours. Coagulation Profile: No results for input(s): INR, PROTIME in the last 168 hours. Cardiac Enzymes:  Recent Labs Lab 02/17/17 1819 02/17/17 2338 02/18/17 0537  TROPONINI 0.04* 0.04* 0.04*   BNP (last 3 results)  Recent Labs  12/08/16 1238 12/23/16 0953 01/12/17 0927  PROBNP 3,713* 5,245* 4,653*   HbA1C: No results for input(s): HGBA1C in the last 72 hours. CBG: No results for input(s): GLUCAP in the last 168 hours. Lipid Profile: No results for input(s): CHOL, HDL, LDLCALC, TRIG, CHOLHDL, LDLDIRECT in the last 72 hours. Thyroid Function Tests:  Recent Labs  02/17/17 1819  TSH 0.904  FREET4 1.12   Anemia Panel: No results for input(s): VITAMINB12, FOLATE, FERRITIN, TIBC, IRON, RETICCTPCT in the last 72 hours. Sepsis Labs: No results for input(s): PROCALCITON, LATICACIDVEN in the last 168 hours.  No results found for this or any previous visit (from the past 240 hour(s)).       Radiology Studies: Dg Chest 2 View  Result Date: 02/19/2017 CLINICAL DATA:  Shortness of breath. Acute pulmonary embolism. History of atrial fibrillation, chronic CHF, cholangiocarcinoma. EXAM: CHEST  2 VIEW COMPARISON:  Chest x-ray and chest CT scan of February 17, 2017 FINDINGS: The lungs are well-expanded. The interstitial markings are more conspicuous today. The pulmonary vascularity  remains engorged. The cardiac silhouette remains enlarged. There small bilateral pleural effusions which are stable. There is calcification in the wall of the thoracic aorta. There is multilevel degenerative disc disease of the thoracic spine. IMPRESSION: CHF with mild interstitial edema and small bilateral pleural effusions. No acute pneumonia. Thoracic aortic atherosclerosis. Electronically Signed   By: David  Martinique M.D.   On: 02/19/2017 08:58        Scheduled Meds: . apixaban  10 mg Oral BID  Followed by  . [START ON 02/26/2017] apixaban  5 mg Oral BID  . diltiazem  90 mg Oral Q12H  . furosemide  40 mg Intravenous Q12H  . loratadine  10 mg Oral Daily  . pantoprazole  40 mg Oral Daily  . polyethylene glycol  17 g Oral QHS   Continuous Infusions:    LOS: 3 days    Time spent: 35 minutes.     Hosie Poisson, MD [facility] Pager [phone]   If 7PM-7AM, please contact night-coverage [email or web address] Password [facility] 02/20/2017, 3:14 PM

## 2017-02-20 NOTE — Evaluation (Signed)
Physical Therapy Evaluation Patient Details Name: Jimmy Myers MRN: [id number] DOB: 1932-08-19 Today's Date: 02/20/2017   History of Present Illness  Patient is an 83 yo male admitted 02/17/17 with 3-4 days of SOB.  Patient with acute respiratory failure with hypoxia due to PE and acute on chronic HF.  Patient with EF of 20%, and with Afib with RVR.    PMH:  CKD, anxiety, CHF, HTN, Afib, bradycardia  Clinical Impression  Patient presents with general weakness, decreased balance, both impacting functional mobility and gait.  Will benefit from acute PT to maximize functional independence prior to return home with wife.  Recommended HHPT to patient - he declined HHPT stating he can get better on his own.  Will continue to follow while in hospital.    Follow Up Recommendations Supervision for mobility/OOB;Home health PT (Patient declined HHPT)    Equipment Recommendations  None recommended by PT    Recommendations for Other Services       Precautions / Restrictions Precautions Precautions: Fall Precaution Comments: 2 falls in last 6 months (in store, and in bathroom) Restrictions Weight Bearing Restrictions: No      Mobility  Bed Mobility Overal bed mobility: Independent                Transfers Overall transfer level: Modified independent Equipment used: Straight cane;None                Ambulation/Gait Ambulation/Gait assistance: Supervision Ambulation Distance (Feet): 40 Feet Assistive device: Straight cane Gait Pattern/deviations: Step-through pattern;Decreased step length - right;Decreased step length - left;Decreased stride length;Shuffle;Trunk flexed Gait velocity: decreased Gait velocity interpretation: Below normal speed for age/gender General Gait Details: Patient with slow, steady gait with use of cane.  Very short, shuffling steps.  No loss of balance during gait.  Stairs            Wheelchair Mobility    Modified Rankin (Stroke Patients  Only)       Balance Overall balance assessment: Needs assistance;History of Falls Sitting-balance support: No upper extremity supported;Feet supported Sitting balance-Leahy Scale: Good     Standing balance support: No upper extremity supported Standing balance-Leahy Scale: Fair                               Pertinent Vitals/Pain Pain Assessment: No/denies pain    Home Living Family/patient expects to be discharged to:: Private residence Living Arrangements: Spouse/significant other Available Help at Discharge: Family;Available 24 hours/day Type of Home: House Home Access: Ramped entrance     Home Layout: One level Home Equipment: Walker - 4 wheels;Cane - single point;Bedside commode;Shower seat;Wheelchair - manual      Prior Function Level of Independence: Independent with assistive device(s)         Comments: Uses cane outside of house.  Drives.     Hand Dominance   Dominant Hand: Right    Extremity/Trunk Assessment   Upper Extremity Assessment Upper Extremity Assessment: Overall WFL for tasks assessed    Lower Extremity Assessment Lower Extremity Assessment: Generalized weakness       Communication   Communication: No difficulties  Cognition Arousal/Alertness: Awake/alert Behavior During Therapy: WFL for tasks assessed/performed Overall Cognitive Status: Within Functional Limits for tasks assessed  General Comments      Exercises     Assessment/Plan    PT Assessment Patient needs continued PT services  PT Problem List Decreased strength;Decreased activity tolerance;Decreased balance;Decreased mobility;Cardiopulmonary status limiting activity       PT Treatment Interventions DME instruction;Gait training;Functional mobility training;Therapeutic activities;Therapeutic exercise;Balance training;Patient/family education    PT Goals (Current goals can be found in the Care Plan  section)  Acute Rehab PT Goals Patient Stated Goal: To go home PT Goal Formulation: With patient/family Time For Goal Achievement: 02/27/17 Potential to Achieve Goals: Good    Frequency Min 3X/week   Barriers to discharge        Co-evaluation               AM-PAC PT "6 Clicks" Daily Activity  Outcome Measure Difficulty turning over in bed (including adjusting bedclothes, sheets and blankets)?: None Difficulty moving from lying on back to sitting on the side of the bed? : None Difficulty sitting down on and standing up from a chair with arms (e.g., wheelchair, bedside commode, etc,.)?: A Little Help needed moving to and from a bed to chair (including a wheelchair)?: A Little Help needed walking in hospital room?: A Little Help needed climbing 3-5 steps with a railing? : A Little 6 Click Score: 20    End of Session Equipment Utilized During Treatment: Gait belt Activity Tolerance: Patient limited by fatigue (Limited by dyspnea) Patient left: in bed;with call bell/phone within reach;with family/visitor present Nurse Communication: Mobility status PT Visit Diagnosis: Unsteadiness on feet (R26.81);Other abnormalities of gait and mobility (R26.89);Muscle weakness (generalized) (M62.81);History of falling (Z91.81)    Time: 7353-2992 PT Time Calculation (min) (ACUTE ONLY): 25 min   Charges:   PT Evaluation $PT Eval Moderate Complexity: 1 Procedure PT Treatments $Gait Training: 8-22 mins   PT G Codes:        Carita Pian. Sanjuana Kava, [facility] Acute Rehab Services Pager [place] 02/20/2017, 2:48 PM

## 2017-02-20 NOTE — Progress Notes (Signed)
Eliquis Rx and latest progress note faxed to [place]. [phone]. Rx left on front of chart.

## 2017-02-20 NOTE — Care Management Important Message (Signed)
Important Message  Patient Details  Name: Jimmy Myers MRN: [id number] Date of Birth: September 09, 1931   Medicare Important Message Given:  Yes    Orbie Pyo 02/20/2017, 12:15 PM

## 2017-02-21 ENCOUNTER — Inpatient Hospital Stay (HOSPITAL_COMMUNITY): Payer: PPO

## 2017-02-21 DIAGNOSIS — I4891 Unspecified atrial fibrillation: Secondary | ICD-10-CM

## 2017-02-21 DIAGNOSIS — I5041 Acute combined systolic (congestive) and diastolic (congestive) heart failure: Secondary | ICD-10-CM

## 2017-02-21 DIAGNOSIS — I10 Essential (primary) hypertension: Secondary | ICD-10-CM

## 2017-02-21 DIAGNOSIS — J9601 Acute respiratory failure with hypoxia: Secondary | ICD-10-CM

## 2017-02-21 LAB — BASIC METABOLIC PANEL
ANION GAP: 9 (ref 5–15)
BUN: 32 mg/dL — ABNORMAL HIGH (ref 6–20)
CALCIUM: 8.7 mg/dL — AB (ref 8.9–10.3)
CO2: 29 mmol/L (ref 22–32)
Chloride: 102 mmol/L (ref 101–111)
Creatinine, Ser: 1.56 mg/dL — ABNORMAL HIGH (ref 0.61–1.24)
GFR, EST AFRICAN AMERICAN: 46 mL/min — AB (ref >60)
GFR, EST NON AFRICAN AMERICAN: 40 mL/min — AB (ref >60)
Glucose, Bld: 121 mg/dL — ABNORMAL HIGH (ref 65–99)
Potassium: 2.9 mmol/L — ABNORMAL LOW (ref 3.5–5.1)
Sodium: 140 mmol/L (ref 135–145)

## 2017-02-21 LAB — CBC
HCT: 36.4 % — ABNORMAL LOW (ref 39.0–52.0)
Hemoglobin: 11.6 g/dL — ABNORMAL LOW (ref 13.0–17.0)
MCH: 30.7 pg (ref 26.0–34.0)
MCHC: 31.9 g/dL (ref 30.0–36.0)
MCV: 96.3 fL (ref 78.0–100.0)
PLATELETS: 110 10*3/uL — AB (ref 150–400)
RBC: 3.78 MIL/uL — AB (ref 4.22–5.81)
RDW: 13.5 % (ref 11.5–15.5)
WBC: 4.9 10*3/uL (ref 4.0–10.5)

## 2017-02-21 LAB — MAGNESIUM: MAGNESIUM: 1.9 mg/dL (ref 1.7–2.4)

## 2017-02-21 MED ORDER — METOPROLOL TARTRATE 25 MG PO TABS
25.0000 mg | ORAL_TABLET | Freq: Three times a day (TID) | ORAL | Status: DC
  Administered 2017-02-21 – 2017-02-23 (×4): 25 mg via ORAL
  Filled 2017-02-21 (×4): qty 1

## 2017-02-21 MED ORDER — POTASSIUM CHLORIDE 10 MEQ/100ML IV SOLN
10.0000 meq | INTRAVENOUS | Status: AC
  Administered 2017-02-21 (×2): 10 meq via INTRAVENOUS
  Filled 2017-02-21 (×2): qty 100

## 2017-02-21 MED ORDER — METOPROLOL TARTRATE 12.5 MG HALF TABLET
12.5000 mg | ORAL_TABLET | Freq: Once | ORAL | Status: AC
  Administered 2017-02-21: 12.5 mg via ORAL
  Filled 2017-02-21: qty 1

## 2017-02-21 MED ORDER — FUROSEMIDE 40 MG PO TABS
40.0000 mg | ORAL_TABLET | Freq: Two times a day (BID) | ORAL | Status: DC
  Administered 2017-02-22 (×2): 40 mg via ORAL
  Filled 2017-02-21 (×3): qty 1

## 2017-02-21 MED ORDER — TRAZODONE HCL 50 MG PO TABS
50.0000 mg | ORAL_TABLET | Freq: Every evening | ORAL | Status: DC | PRN
  Administered 2017-02-21 – 2017-02-22 (×2): 100 mg via ORAL
  Filled 2017-02-21 (×2): qty 2

## 2017-02-21 MED ORDER — POTASSIUM CHLORIDE CRYS ER 20 MEQ PO TBCR
40.0000 meq | EXTENDED_RELEASE_TABLET | Freq: Two times a day (BID) | ORAL | Status: AC
  Administered 2017-02-21 (×2): 40 meq via ORAL
  Filled 2017-02-21 (×2): qty 2

## 2017-02-21 NOTE — Progress Notes (Signed)
Physical Therapy Treatment Patient Details Name: Jimmy Myers MRN: [id number] DOB: Mar 10, 1932 Today's Date: 02/21/2017    History of Present Illness Patient is an 83 yo male admitted 02/17/17 with 3-4 days of SOB.  Patient with acute respiratory failure with hypoxia due to PE and acute on chronic HF.  Patient with EF of 20%, and with Afib with RVR.    PMH:  CKD, anxiety, CHF, HTN, Afib, bradycardia    PT Comments    Patient making improvements with mobility and gait.  Able to ambulate 160' today with RW.  Dyspnea improved with mobility - today at 2/4.   Follow Up Recommendations  Supervision for mobility/OOB;Home health PT (Patient declining HHPT)     Equipment Recommendations  None recommended by PT    Recommendations for Other Services       Precautions / Restrictions Precautions Precautions: Fall Precaution Comments: 2 falls in last 6 months (in store, and in bathroom) Restrictions Weight Bearing Restrictions: No    Mobility  Bed Mobility Overal bed mobility: Independent                Transfers Overall transfer level: Modified independent Equipment used: Rolling walker (2 wheeled)                Ambulation/Gait Ambulation/Gait assistance: Supervision Ambulation Distance (Feet): 160 Feet Assistive device: Rolling walker (2 wheeled) Gait Pattern/deviations: Step-through pattern;Decreased step length - right;Decreased step length - left;Decreased stride length;Decreased dorsiflexion - right;Shuffle;Trunk flexed Gait velocity: decreased Gait velocity interpretation: Below normal speed for age/gender General Gait Details: Patient requested to use RW today for more support.  Slow gait speed.  Noted Rt foot sliding across floor during step phase.  Cues to stand upright.   Stairs            Wheelchair Mobility    Modified Rankin (Stroke Patients Only)       Balance           Standing balance support: No upper extremity supported Standing  balance-Leahy Scale: Fair                              Cognition Arousal/Alertness: Awake/alert Behavior During Therapy: Restless (Irritated) Overall Cognitive Status: Within Functional Limits for tasks assessed                                        Exercises      General Comments        Pertinent Vitals/Pain Pain Assessment: No/denies pain    Home Living                      Prior Function            PT Goals (current goals can now be found in the care plan section) Acute Rehab PT Goals Patient Stated Goal: To go home Progress towards PT goals: Progressing toward goals    Frequency    Min 3X/week      PT Plan Current plan remains appropriate    Co-evaluation              AM-PAC PT "6 Clicks" Daily Activity  Outcome Measure  Difficulty turning over in bed (including adjusting bedclothes, sheets and blankets)?: None Difficulty moving from lying on back to sitting on the side of the bed? : None Difficulty  sitting down on and standing up from a chair with arms (e.g., wheelchair, bedside commode, etc,.)?: A Little Help needed moving to and from a bed to chair (including a wheelchair)?: A Little Help needed walking in hospital room?: A Little Help needed climbing 3-5 steps with a railing? : A Little 6 Click Score: 20    End of Session Equipment Utilized During Treatment: Gait belt Activity Tolerance: Other (comment) (Limited by DOE of 2/4.  Patient able to talk during gait tod) Patient left: in bed;with call bell/phone within reach;with bed alarm set Nurse Communication: Mobility status PT Visit Diagnosis: Unsteadiness on feet (R26.81);Other abnormalities of gait and mobility (R26.89);Muscle weakness (generalized) (M62.81);History of falling (Z91.81)     Time: 0454-0981 PT Time Calculation (min) (ACUTE ONLY): 22 min  Charges:  $Gait Training: 8-22 mins                    G Codes:       Carita Pian. Sanjuana Kava,  [facility] Acute Rehab Services Pager [place] 02/21/2017, 6:20 PM

## 2017-02-21 NOTE — Progress Notes (Addendum)
PROGRESS NOTE    Jimmy Myers  PNT:[id number] DOB: 04-13-1932 DOA: 02/17/2017 PCP: [name], MD    Brief Narrative:  Jimmy Myers is a 83 y.o. male with medical history significant of a defibrillation, anxiety, atrial thrombus, lymphoma, CHF, GERD, hypertension, cholangiocarcinoma,  Patient presented with 3-4 day history of shortness of breath. on arrival to ED, he was found to have A fib with RVR and PE. He was started on IV heparin and transitioned to eliquis.   Assessment & Plan:   Active Problems:   HTN (hypertension), benign   Cholangiocarcinoma ([place])   Pulmonary embolism (HCC)   Chronic combined systolic (congestive) and diastolic (congestive) heart failure (HCC)   Acute respiratory failure (HCC)   Acute respiratory failure with hypoxia secondary to Pulmonary embolus and acute on chronic systolic and diastolic heart failure. Started him on IV heparin , transitioned to eliquis.  Nasal canula oxygen as needed to keep sats greater than 90%.  Echocardiogram  Shows worsening of LVEF to 20%.  Strict intake and output.  Started on IV lasix 40 mg BID diuresed appropriately, .his renal function started worsening, cardiology changed to po lasix 40 mg BID.   CXR shows CHF with pleural effusions, repeat CXR shows resolution of pulmonary congestion, persistent pleural effusions.  Still sob, but slightly better than yesterday.  Cardiology consulted and recommendations given.     H/o Cholangiocarcinoma:  Outpatient follow up with Dr Irene Limbo.   Afib with RVR:  Rate is better, but nt optimal.  cardizem stopped as his EF IS 20%. Changed to coreg. TSH wnl.  Mildly elevated troponin's possibly from the afib with RVR.  On eliquis.    Mild thrombocytopenia: chronic, but no signs of bleeding.  Platelets are improving.    Stage 2 CKD:  His baseline creatinine is 1.2, today is 1.56 possibly fro overdiuresis, changed to po lasix.  Aneurysmal dilatation ascending thoracic aorta 4.8  cm transverse Unchanged Semi annual follow up with an [place].  Follow up with cardiothoracic surgery as outpatient.     Mild normocytic anemia: baseline hemoglobin at 11.  Stable.    Hypokalemia: repleted. Mag is normal.     DVT prophylaxis: eliquis Code Status: (Full) Family Communication: none at bedside. , discussed the plan of care with the patient.  Disposition Plan: home health PT in 1 to 2 days.   Consultants:   Cardiology.    Procedures: ECHOCARDIOGRAM.    Antimicrobials: NONE.    Subjective: Sob improved, cough is better.  No chest pain.  No nausea, or vomiting.   Objective: Vitals:   02/20/17 2050 02/21/17 0013 02/21/17 0345 02/21/17 1231  BP: 101/72  102/64 100/65  Pulse: (!) 102 81 91 61  Resp: 18 20 18 18   Temp: 98.1 F (36.7 C)  98.8 F (37.1 C) 98 F (36.7 C)  TempSrc: Oral  Oral Oral  SpO2: 97% 97% 96% 99%  Weight:   89.2 kg (196 lb 9.6 oz)   Height:        Intake/Output Summary (Last 24 hours) at 02/21/17 1805 Last data filed at 02/21/17 1700  Gross per 24 hour  Intake             1020 ml  Output             2025 ml  Net            -1005 ml   Filed Weights   02/19/17 0533 02/20/17 0519 02/21/17 0345  Weight: 92  kg (202 lb 13.2 oz) 90.6 kg (199 lb 12.8 oz) 89.2 kg (196 lb 9.6 oz)    Examination:  General exam: comfortable. Not on oxygen. Not in any distress.  Respiratory system: diminished at bases.  Cardiovascular system: S1 & S2 heard,irregular, Gastrointestinal system: Abdomen is soft NT nd BS+ Central nervous system: alert and oriented. Non focal.  Extremities: swelling improved. No cyanosis or clubbing.  Skin: No rashes, lesions or ulcers Psychiatry: MOOD appropriate.     Data Reviewed: I have personally reviewed following labs and imaging studies  CBC:  Recent Labs Lab 02/17/17 1219 02/18/17 0537 02/19/17 0306 02/20/17 0238 02/21/17 0151  WBC 7.6 5.2 5.3 4.6 4.9  HGB 13.0 11.4* 11.0* 10.8* 11.6*  HCT 40.7  35.5* 34.6* 33.5* 36.4*  MCV 98.3 98.6 97.7 96.5 96.3  PLT 112* 83* 84* 89* 809*   Basic Metabolic Panel:  Recent Labs Lab 02/17/17 1219 02/17/17 1819 02/18/17 0537 02/19/17 0306 02/20/17 1055 02/21/17 0151 02/21/17 0737  NA 139  --  138 138 140 140  --   K 4.6  --  3.8 4.3 3.9 2.9*  --   CL 105  --  106 107 106 102  --   CO2 27  --  26 25 26 29   --   GLUCOSE 83  --  104* 106* 114* 121*  --   BUN 25*  --  26* 29* 29* 32*  --   CREATININE 1.36*  --  1.31* 1.46* 1.43* 1.56*  --   CALCIUM 9.1  --  8.2* 8.3* 8.6* 8.7*  --   MG  --  2.0  --   --   --   --  1.9   GFR: Estimated Creatinine Clearance: 41.1 mL/min (A) (by C-G formula based on SCr of 1.56 mg/dL (H)). Liver Function Tests:  Recent Labs Lab 02/18/17 0537  AST 27  ALT 38  ALKPHOS 112  BILITOT 1.3*  PROT 5.2*  ALBUMIN 3.0*   No results for input(s): LIPASE, AMYLASE in the last 168 hours. No results for input(s): AMMONIA in the last 168 hours. Coagulation Profile: No results for input(s): INR, PROTIME in the last 168 hours. Cardiac Enzymes:  Recent Labs Lab 02/17/17 1819 02/17/17 2338 02/18/17 0537  TROPONINI 0.04* 0.04* 0.04*   BNP (last 3 results)  Recent Labs  12/08/16 1238 12/23/16 0953 01/12/17 0927  PROBNP 3,713* 5,245* 4,653*   HbA1C: No results for input(s): HGBA1C in the last 72 hours. CBG: No results for input(s): GLUCAP in the last 168 hours. Lipid Profile: No results for input(s): CHOL, HDL, LDLCALC, TRIG, CHOLHDL, LDLDIRECT in the last 72 hours. Thyroid Function Tests: No results for input(s): TSH, T4TOTAL, FREET4, T3FREE, THYROIDAB in the last 72 hours. Anemia Panel: No results for input(s): VITAMINB12, FOLATE, FERRITIN, TIBC, IRON, RETICCTPCT in the last 72 hours. Sepsis Labs: No results for input(s): PROCALCITON, LATICACIDVEN in the last 168 hours.  No results found for this or any previous visit (from the past 240 hour(s)).       Radiology Studies: Dg Chest Port 1  View  Result Date: 02/21/2017 CLINICAL DATA:  Congestive heart failure. Acute respiratory failure. EXAM: PORTABLE CHEST 1 VIEW COMPARISON:  02/19/2017 and 02/17/2017 FINDINGS: There is cardiomegaly with tortuosity and calcification of the thoracic aorta. Small right pleural effusion appears slightly more prominent. Increased density at the lung bases posterior medially probably represents pleural effusions. The pulmonary vascularity is now normal. IMPRESSION: Bilateral pleural effusions, small but slightly increased on the  right. Resolved pulmonary vascular congestion. Aortic atherosclerosis. Cardiomegaly. Electronically Signed   By: Lorriane Shire M.D.   On: 02/21/2017 10:33        Scheduled Meds: . apixaban  10 mg Oral BID   Followed by  . [START ON 02/26/2017] apixaban  5 mg Oral BID  . [START ON 02/22/2017] furosemide  40 mg Oral BID  . loratadine  10 mg Oral Daily  . metoprolol tartrate  25 mg Oral Q8H  . pantoprazole  40 mg Oral Daily  . polyethylene glycol  17 g Oral QHS  . potassium chloride  40 mEq Oral BID   Continuous Infusions:    LOS: 4 days    Time spent: 35 minutes.     Hosie Poisson, MD [facility] Pager [phone]   If 7PM-7AM, please contact night-coverage [email or web address] Password [facility] 02/21/2017, 6:05 PM

## 2017-02-21 NOTE — Progress Notes (Signed)
Progress Note  Patient Name: Jimmy Myers Date of Encounter: 02/21/2017  Primary Cardiologist: Dr. [name] / Truitt Merle, NP  Subjective   Feeling well.  Breathing improved.  Thinks his edema is better.  Inpatient Medications    Scheduled Meds: . apixaban  10 mg Oral BID   Followed by  . [START ON 02/26/2017] apixaban  5 mg Oral BID  . carvedilol  6.25 mg Oral BID WC  . furosemide  40 mg Intravenous Q12H  . loratadine  10 mg Oral Daily  . pantoprazole  40 mg Oral Daily  . polyethylene glycol  17 g Oral QHS  . potassium chloride  40 mEq Oral BID   Continuous Infusions: . potassium chloride 10 mEq (02/21/17 0959)   PRN Meds: acetaminophen **OR** acetaminophen, albuterol, clorazepate, fluticasone, promethazine   Vital Signs    Vitals:   02/20/17 1739 02/20/17 2050 02/21/17 0013 02/21/17 0345  BP: 98/72 101/72  102/64  Pulse: 95 (!) 102 81 91  Resp:  18 20 18   Temp:  98.1 F (36.7 C)  98.8 F (37.1 C)  TempSrc:  Oral  Oral  SpO2:  97% 97% 96%  Weight:    89.2 kg (196 lb 9.6 oz)  Height:        Intake/Output Summary (Last 24 hours) at 02/21/17 1035 Last data filed at 02/21/17 0830  Gross per 24 hour  Intake             1136 ml  Output             3400 ml  Net            -2264 ml   Filed Weights   02/19/17 0533 02/20/17 0519 02/21/17 0345  Weight: 92 kg (202 lb 13.2 oz) 90.6 kg (199 lb 12.8 oz) 89.2 kg (196 lb 9.6 oz)    Telemetry    Atrial fibrillation.  Rates 90s-120s.  PVCs.  - Personally Reviewed  ECG    n/a - Personally Reviewed  Physical Exam   GEN: Well-appearing.  No acute distress.   Neck: No JVD Cardiac: tachycardic.  Irregularly irregular. No murmurs, rubs, or gallops.  Respiratory: Clear to auscultation bilaterally. GI: Soft, nontender, non-distended  MS: 2+ pitting edema in R foot; No deformity. Neuro:  Nonfocal  Psych: Normal affect   Labs    Chemistry Recent Labs Lab 02/18/17 0537 02/19/17 0306 02/20/17 1055 02/21/17 0151   NA 138 138 140 140  K 3.8 4.3 3.9 2.9*  CL 106 107 106 102  CO2 26 25 26 29   GLUCOSE 104* 106* 114* 121*  BUN 26* 29* 29* 32*  CREATININE 1.31* 1.46* 1.43* 1.56*  CALCIUM 8.2* 8.3* 8.6* 8.7*  PROT 5.2*  --   --   --   ALBUMIN 3.0*  --   --   --   AST 27  --   --   --   ALT 38  --   --   --   ALKPHOS 112  --   --   --   BILITOT 1.3*  --   --   --   GFRNONAA 49* 43* 44* 40*  GFRAA 57* 50* 51* 46*  ANIONGAP 6 6 8 9      Hematology Recent Labs Lab 02/19/17 0306 02/20/17 0238 02/21/17 0151  WBC 5.3 4.6 4.9  RBC 3.54* 3.47* 3.78*  HGB 11.0* 10.8* 11.6*  HCT 34.6* 33.5* 36.4*  MCV 97.7 96.5 96.3  MCH 31.1 31.1 30.7  MCHC 31.8 32.2 31.9  RDW 13.7 13.7 13.5  PLT 84* 89* 110*    Cardiac Enzymes Recent Labs Lab 02/17/17 1819 02/17/17 2338 02/18/17 0537  TROPONINI 0.04* 0.04* 0.04*    Recent Labs Lab 02/17/17 1225  TROPIPOC 0.02     BNP Recent Labs Lab 02/17/17 1819  BNP 596.1*     DDimer No results for input(s): DDIMER in the last 168 hours.   Radiology    Dg Chest Port 1 View  Result Date: 02/21/2017 CLINICAL DATA:  Congestive heart failure. Acute respiratory failure. EXAM: PORTABLE CHEST 1 VIEW COMPARISON:  02/19/2017 and 02/17/2017 FINDINGS: There is cardiomegaly with tortuosity and calcification of the thoracic aorta. Small right pleural effusion appears slightly more prominent. Increased density at the lung bases posterior medially probably represents pleural effusions. The pulmonary vascularity is now normal. IMPRESSION: Bilateral pleural effusions, small but slightly increased on the right. Resolved pulmonary vascular congestion. Aortic atherosclerosis. Cardiomegaly. Electronically Signed   By: Lorriane Shire M.D.   On: 02/21/2017 10:33    Cardiac Studies   Echo 02/19/17: Study Conclusions  - Left ventricle: The cavity size was severely dilated. Systolic   function was severely reduced. The estimated ejection fraction   was in the range of 20% to  25%. Severe diffuse hypokinesis with   regional variations. The study was not technically sufficient to   allow evaluation of LV diastolic dysfunction due to atrial   fibrillation. - Aortic valve: Moderately to severely calcified annulus.   Trileaflet; mildly thickened, moderately calcified leaflets.   Valve area (VTI): 1.92 cm^2. Valve area (Vmax): 1.77 cm^2. Valve   area (Vmean): 2.15 cm^2. - Aorta: Aortic root dimension: 50 mm (ED). - Aortic root: The aortic root was severely dilated. - Left atrium: The atrium was moderately to severely dilated. - Right ventricle: The cavity size was mildly dilated. Wall   thickness was normal. - Pulmonary arteries: PA peak pressure: 34 mm Hg (S).  R LE venous Doppler 02/19/17: No evidence of deep vein thrombosis involving the right lower   extremity. - No evidence of Baker&'s cyst on the right. - Non-thrombosed varicose veins noted in the right calf.  Patient Profile     83 y.o. male with chronic systolic and diastolic heart failure, chronic atrial fibrillation, SSS, hypertension, moderate ascending aortic aneurysm, and cholangiocarcinoma undergoing XRT here with acute on chronic heart failure and atrial fibrillation with RVR in the setting of newly diagnosed pulmonary embolism.   Assessment & Plan    # Chronic atrial fibrillation with RVR: # SSS:   Heart rates are currently poorly-controlled. He has not been on a beta blocker and in the past due to bradycardia.  He was initially started on diltiazem but this was discontinued due to systolic dysfunction. We will switch carvedilol to metoprolol for improved heart rate control. Start metoprolol tartrate 25 mg every 8 hours.  Continue Eliquis.   # Acute on chronic systolic and diastolic heart failure:  Systolic function was 23-53% but improved to 40-45% on his most recent echo 12/2015. However systolic function was reduced back to 20-25% this admission. Beta blocker as above. No blood pressure room for  ARB/ARNI.  He appears to be euvolemic and renal function is starting to rise. Baseline creatinine is 1.1-1.2.  We will stop IV Lasix and likely start oral Lasix tomorrow. He does have a right pleural effusion that is worsening despite diuresis. If he becomes symptomatic he may need thoracentesis.   # Hypertension:  Blood pressure  control. Switching from carvedilol to metoprolol as above   # Pulmonary embolism: No evidence of RV dysfunction on echo. Bilateral lower extremity Dopplers were negative for DVT. He was appropriately started on Eliquis.   # Moderate ascending aorta aneurysm: Ascending aorta was 4.8 cm by CT, which increased from 4.5 cm in 2017. This is previously been followed by Dr. [name] but the patient opted to longer follow it.  # Cholangiocarcinoma: Currently on XRT. He is scheduled to have repeat scans to assess whether it is working. He is unsure whether he wants to continue with treatment.  Management per primary team/oncology.     Signed, Skeet Latch, MD  02/21/2017, 10:35 AM

## 2017-02-22 DIAGNOSIS — R0602 Shortness of breath: Secondary | ICD-10-CM

## 2017-02-22 DIAGNOSIS — I481 Persistent atrial fibrillation: Secondary | ICD-10-CM

## 2017-02-22 LAB — BASIC METABOLIC PANEL
Anion gap: 8 (ref 5–15)
BUN: 39 mg/dL — AB (ref 6–20)
CALCIUM: 8.8 mg/dL — AB (ref 8.9–10.3)
CO2: 27 mmol/L (ref 22–32)
Chloride: 105 mmol/L (ref 101–111)
Creatinine, Ser: 1.55 mg/dL — ABNORMAL HIGH (ref 0.61–1.24)
GFR calc Af Amer: 46 mL/min — ABNORMAL LOW (ref >60)
GFR, EST NON AFRICAN AMERICAN: 40 mL/min — AB (ref >60)
GLUCOSE: 108 mg/dL — AB (ref 65–99)
Potassium: 4.3 mmol/L (ref 3.5–5.1)
Sodium: 140 mmol/L (ref 135–145)

## 2017-02-22 LAB — CBC
HCT: 37.7 % — ABNORMAL LOW (ref 39.0–52.0)
Hemoglobin: 12 g/dL — ABNORMAL LOW (ref 13.0–17.0)
MCH: 31.3 pg (ref 26.0–34.0)
MCHC: 31.8 g/dL (ref 30.0–36.0)
MCV: 98.2 fL (ref 78.0–100.0)
PLATELETS: 118 10*3/uL — AB (ref 150–400)
RBC: 3.84 MIL/uL — ABNORMAL LOW (ref 4.22–5.81)
RDW: 13.8 % (ref 11.5–15.5)
WBC: 4.6 10*3/uL (ref 4.0–10.5)

## 2017-02-22 MED ORDER — METOPROLOL TARTRATE 12.5 MG HALF TABLET
12.5000 mg | ORAL_TABLET | Freq: Once | ORAL | Status: AC
  Administered 2017-02-22: 12.5 mg via ORAL
  Filled 2017-02-22: qty 1

## 2017-02-22 NOTE — Progress Notes (Signed)
Progress Note  Patient Name: Jimmy Myers Date of Encounter: 02/22/2017  Primary Cardiologist: Dr. [name] / Truitt Merle, NP  Subjective   Feeling well.  Denies chest pain or shortness of breath.  No palpitations.    Inpatient Medications    Scheduled Meds: . apixaban  10 mg Oral BID   Followed by  . [START ON 02/26/2017] apixaban  5 mg Oral BID  . furosemide  40 mg Oral BID  . loratadine  10 mg Oral Daily  . metoprolol tartrate  25 mg Oral Q8H  . pantoprazole  40 mg Oral Daily  . polyethylene glycol  17 g Oral QHS   Continuous Infusions:  PRN Meds: acetaminophen **OR** acetaminophen, albuterol, clorazepate, fluticasone, promethazine, traZODone   Vital Signs    Vitals:   02/21/17 1231 02/21/17 1922 02/21/17 2206 02/22/17 0502  BP: 100/65 99/66 95/65  95/73  Pulse: 61 99 91 99  Resp: 18 18  18   Temp: 98 F (36.7 C) 98.4 F (36.9 C)  98.1 F (36.7 C)  TempSrc: Oral Oral  Oral  SpO2: 99% 98%  95%  Weight:    88.9 kg (195 lb 14.4 oz)  Height:        Intake/Output Summary (Last 24 hours) at 02/22/17 1139 Last data filed at 02/22/17 0505  Gross per 24 hour  Intake              600 ml  Output             1260 ml  Net             -660 ml   Filed Weights   02/20/17 0519 02/21/17 0345 02/22/17 0502  Weight: 90.6 kg (199 lb 12.8 oz) 89.2 kg (196 lb 9.6 oz) 88.9 kg (195 lb 14.4 oz)    Telemetry    Atrial fibrillation.  Rates 90s-120s.  PVCs.  - Personally Reviewed  ECG    n/a - Personally Reviewed  Physical Exam   GEN: Chronically ill-appearing.  No acute distress.   Neck: JVP 1cm above clavicle at 45 degrees Cardiac:  Irregularly irregular. No murmurs, rubs, or gallops.  Respiratory: Clear to auscultation bilaterally.  No crackles, wheezes or rhonchi.  GI: Soft, nontender, non-distended  MS: 1+ pitting edema in R foot; No deformity. Neuro:  Nonfocal  Psych: Normal affect   Labs    Chemistry Recent Labs Lab 02/18/17 0537  02/20/17 1055  02/21/17 0151 02/22/17 0530  NA 138  < > 140 140 140  K 3.8  < > 3.9 2.9* 4.3  CL 106  < > 106 102 105  CO2 26  < > 26 29 27   GLUCOSE 104*  < > 114* 121* 108*  BUN 26*  < > 29* 32* 39*  CREATININE 1.31*  < > 1.43* 1.56* 1.55*  CALCIUM 8.2*  < > 8.6* 8.7* 8.8*  PROT 5.2*  --   --   --   --   ALBUMIN 3.0*  --   --   --   --   AST 27  --   --   --   --   ALT 38  --   --   --   --   ALKPHOS 112  --   --   --   --   BILITOT 1.3*  --   --   --   --   GFRNONAA 49*  < > 44* 40* 40*  GFRAA 57*  < >  51* 46* 46*  ANIONGAP 6  < > 8 9 8   < > = values in this interval not displayed.   Hematology  Recent Labs Lab 02/20/17 0238 02/21/17 0151 02/22/17 0530  WBC 4.6 4.9 4.6  RBC 3.47* 3.78* 3.84*  HGB 10.8* 11.6* 12.0*  HCT 33.5* 36.4* 37.7*  MCV 96.5 96.3 98.2  MCH 31.1 30.7 31.3  MCHC 32.2 31.9 31.8  RDW 13.7 13.5 13.8  PLT 89* 110* 118*    Cardiac Enzymes  Recent Labs Lab 02/17/17 1819 02/17/17 2338 02/18/17 0537  TROPONINI 0.04* 0.04* 0.04*     Recent Labs Lab 02/17/17 1225  TROPIPOC 0.02  BNP  Recent Labs Lab 02/17/17 1819  BNP 596.1*     DDimer No results for input(s): DDIMER in the last 168 hours.   Radiology    Dg Chest Port 1 View  Result Date: 02/21/2017 CLINICAL DATA:  Congestive heart failure. Acute respiratory failure. EXAM: PORTABLE CHEST 1 VIEW COMPARISON:  02/19/2017 and 02/17/2017 FINDINGS: There is cardiomegaly with tortuosity and calcification of the thoracic aorta. Small right pleural effusion appears slightly more prominent. Increased density at the lung bases posterior medially probably represents pleural effusions. The pulmonary vascularity is now normal. IMPRESSION: Bilateral pleural effusions, small but slightly increased on the right. Resolved pulmonary vascular congestion. Aortic atherosclerosis. Cardiomegaly. Electronically Signed   By: Lorriane Shire M.D.   On: 02/21/2017 10:33    Cardiac Studies   Echo 02/19/17: Study  Conclusions  - Left ventricle: The cavity size was severely dilated. Systolic   function was severely reduced. The estimated ejection fraction   was in the range of 20% to 25%. Severe diffuse hypokinesis with   regional variations. The study was not technically sufficient to   allow evaluation of LV diastolic dysfunction due to atrial   fibrillation. - Aortic valve: Moderately to severely calcified annulus.   Trileaflet; mildly thickened, moderately calcified leaflets.   Valve area (VTI): 1.92 cm^2. Valve area (Vmax): 1.77 cm^2. Valve   area (Vmean): 2.15 cm^2. - Aorta: Aortic root dimension: 50 mm (ED). - Aortic root: The aortic root was severely dilated. - Left atrium: The atrium was moderately to severely dilated. - Right ventricle: The cavity size was mildly dilated. Wall   thickness was normal. - Pulmonary arteries: PA peak pressure: 34 mm Hg (S).  R LE venous Doppler 02/19/17: No evidence of deep vein thrombosis involving the right lower   extremity. - No evidence of Baker&'s cyst on the right. - Non-thrombosed varicose veins noted in the right calf.  Patient Profile     83 y.o. male with chronic systolic and diastolic heart failure, chronic atrial fibrillation, SSS, hypertension, ([place] moderate ascending aortic aneurysm, and cholangiocarcinoma undergoing XRT here with acute on chronic heart failure and atrial fibrillation with RVR in the setting of newly diagnosed pulmonary embolism.   Assessment & Plan    # Chronic atrial fibrillation with RVR: # SSS:   Heart rates are better-controlled, though not ideal.  Given that he has not been able to tolerate metoprolol in the past due to bradycardia and his low BP, we will not increase it at this time. Continue Eliquis.   # Acute on chronic systolic and diastolic heart failure:  Systolic function was 59-93% but improved to 40-45% on his most recent echo 12/2015. However systolic function was reduced back to 20-25% this admission.  Beta blocker as above. No blood pressure room for ARB/ARNI.  He appears to be euvolemic.  Renal  function stable but elevated today. Baseline creatinine is 1.1-1.2.  Continue lasix 40 mg po daily.  He is concerned that his UOP has been poor.  However he also has minimal oral intake.  Weight is down to 195lb from 196 lb yesterday. He does have a right pleural effusion that is worsening despite diuresis. If he becomes symptomatic he may need thoracentesis.   # Hypertension:  Blood pressure controlled.  Continue metoprolol.  # Pulmonary embolism: No evidence of RV dysfunction on echo. Bilateral lower extremity Dopplers were negative for DVT. He was appropriately started on Eliquis.   # Moderate ascending aorta aneurysm: Ascending aorta was 4.8 cm by CT, which increased from 4.5 cm in 2017. This is previously been followed by Dr. [name] but the patient opted to longer follow it.  # Cholangiocarcinoma: Currently on XRT. He is scheduled to have repeat scans to assess whether it is working. He is unsure whether he wants to continue with treatment.  Management per primary team/oncology.     Signed, Skeet Latch, MD  02/22/2017, 11:39 AM   Patient and she is i have much to the n and

## 2017-02-22 NOTE — Progress Notes (Signed)
PROGRESS NOTE    Jimmy Myers  BOF:[id number] DOB: Oct 19, 1931 DOA: 02/17/2017 PCP: [name], MD    Brief Narrative:  Jimmy Myers is a 83 y.o. male with medical history significant of a defibrillation, anxiety, atrial thrombus, lymphoma, CHF, GERD, hypertension, cholangiocarcinoma,  Patient presented with 3-4 day history of shortness of breath. on arrival to ED, he was found to have A fib with RVR and PE.   Assessment & Plan:   Active Problems:   HTN (hypertension), benign   Cholangiocarcinoma ([place])   Pulmonary embolism (HCC)   Chronic combined systolic (congestive) and diastolic (congestive) heart failure (HCC)   Acute respiratory failure (HCC)   Acute respiratory failure with hypoxia secondary to Pulmonary embolus and acute on chronic systolic and diastolic heart failure. Started him on IV heparin , transitioned to eliquis.  Nasal canula oxygen as needed to keep sats greater than 90%.  Echocardiogram  Shows worsening of LVEF to 20%.  Strict intake and output.  Started on IV lasix 40 mg BID diuresed appropriately, .his renal function started worsening, cardiology changed to po lasix 40 mg BID to daily from 6/24.  CXR shows CHF with pleural effusions, repeat CXR shows resolution of pulmonary congestion, persistent pleural effusions.  Pt reports some sob not back to baseline.  PT evaluation recommending home health PT.     H/o Cholangiocarcinoma:  Outpatient follow up with Dr Irene Limbo.   Afib with RVR:  RATE is better. cardizem stopped as his EF IS 20%. Changed to coreg. TSH wnl.  Mildly elevated troponin's possibly from the afib with RVR.  On eliquis.    Mild thrombocytopenia: improved to 118,000. No evidence of bleeding.    Stage 2 CKD:  His baseline creatinine is 1.2, today is 1.56 possibly fro overdiuresis, changed to po lasix. Creatinine remains stable around 1.55.   Aneurysmal dilatation ascending thoracic aorta 4.8 cm transverse Unchanged Semi annual follow  up with an [place].  Follow up with cardiothoracic surgery as outpatient in one week.     Mild normocytic anemia: baseline hemoglobin around 11. .  Stable.    Hypokalemia: repleted. Mag is normal.     DVT prophylaxis: eliquis Code Status: (Full) Family Communication: wife at bedside. , discussed the plan of care with the patient.  Disposition Plan: home health PT ,possibly tomorrow.   Consultants:   Cardiology.    Procedures: ECHOCARDIOGRAM.    Antimicrobials: NONE.    Subjective: Pedal edema resolved.  Breathing better but not back to baseline.   Objective: Vitals:   02/21/17 1231 02/21/17 1922 02/21/17 2206 02/22/17 0502  BP: 100/65 99/66 95/65  95/73  Pulse: 61 99 91 99  Resp: 18 18  18   Temp: 98 F (36.7 C) 98.4 F (36.9 C)  98.1 F (36.7 C)  TempSrc: Oral Oral  Oral  SpO2: 99% 98%  95%  Weight:    88.9 kg (195 lb 14.4 oz)  Height:        Intake/Output Summary (Last 24 hours) at 02/22/17 1618 Last data filed at 02/22/17 0505  Gross per 24 hour  Intake              360 ml  Output              860 ml  Net             -500 ml   Filed Weights   02/20/17 0519 02/21/17 0345 02/22/17 0502  Weight: 90.6 kg (199 lb 12.8 oz) 89.2  kg (196 lb 9.6 oz) 88.9 kg (195 lb 14.4 oz)    Examination:  General exam: comfortable. Not on oxygen but dyspneic when talking.  Respiratory system: diminished at bases.  Cardiovascular system: S1 & S2 heard,irregular,no pedal edema.  Gastrointestinal system: Abdomen is soft NT nd BS+ Central nervous system: alert and oriented. Non focal.  Extremities: no cyanosis or clubbing.   Skin: No rashes, lesions or ulcers Psychiatry: MOOD appropriate.     Data Reviewed: I have personally reviewed following labs and imaging studies  CBC:  Recent Labs Lab 02/18/17 0537 02/19/17 0306 02/20/17 0238 02/21/17 0151 02/22/17 0530  WBC 5.2 5.3 4.6 4.9 4.6  HGB 11.4* 11.0* 10.8* 11.6* 12.0*  HCT 35.5* 34.6* 33.5* 36.4* 37.7*  MCV  98.6 97.7 96.5 96.3 98.2  PLT 83* 84* 89* 110* 785*   Basic Metabolic Panel:  Recent Labs Lab 02/17/17 1819 02/18/17 0537 02/19/17 0306 02/20/17 1055 02/21/17 0151 02/21/17 0737 02/22/17 0530  NA  --  138 138 140 140  --  140  K  --  3.8 4.3 3.9 2.9*  --  4.3  CL  --  106 107 106 102  --  105  CO2  --  26 25 26 29   --  27  GLUCOSE  --  104* 106* 114* 121*  --  108*  BUN  --  26* 29* 29* 32*  --  39*  CREATININE  --  1.31* 1.46* 1.43* 1.56*  --  1.55*  CALCIUM  --  8.2* 8.3* 8.6* 8.7*  --  8.8*  MG 2.0  --   --   --   --  1.9  --    GFR: Estimated Creatinine Clearance: 41.3 mL/min (A) (by C-G formula based on SCr of 1.55 mg/dL (H)). Liver Function Tests:  Recent Labs Lab 02/18/17 0537  AST 27  ALT 38  ALKPHOS 112  BILITOT 1.3*  PROT 5.2*  ALBUMIN 3.0*   No results for input(s): LIPASE, AMYLASE in the last 168 hours. No results for input(s): AMMONIA in the last 168 hours. Coagulation Profile: No results for input(s): INR, PROTIME in the last 168 hours. Cardiac Enzymes:  Recent Labs Lab 02/17/17 1819 02/17/17 2338 02/18/17 0537  TROPONINI 0.04* 0.04* 0.04*   BNP (last 3 results)  Recent Labs  12/08/16 1238 12/23/16 0953 01/12/17 0927  PROBNP 3,713* 5,245* 4,653*   HbA1C: No results for input(s): HGBA1C in the last 72 hours. CBG: No results for input(s): GLUCAP in the last 168 hours. Lipid Profile: No results for input(s): CHOL, HDL, LDLCALC, TRIG, CHOLHDL, LDLDIRECT in the last 72 hours. Thyroid Function Tests: No results for input(s): TSH, T4TOTAL, FREET4, T3FREE, THYROIDAB in the last 72 hours. Anemia Panel: No results for input(s): VITAMINB12, FOLATE, FERRITIN, TIBC, IRON, RETICCTPCT in the last 72 hours. Sepsis Labs: No results for input(s): PROCALCITON, LATICACIDVEN in the last 168 hours.  No results found for this or any previous visit (from the past 240 hour(s)).       Radiology Studies: Dg Chest Port 1 View  Result Date:  02/21/2017 CLINICAL DATA:  Congestive heart failure. Acute respiratory failure. EXAM: PORTABLE CHEST 1 VIEW COMPARISON:  02/19/2017 and 02/17/2017 FINDINGS: There is cardiomegaly with tortuosity and calcification of the thoracic aorta. Small right pleural effusion appears slightly more prominent. Increased density at the lung bases posterior medially probably represents pleural effusions. The pulmonary vascularity is now normal. IMPRESSION: Bilateral pleural effusions, small but slightly increased on the right. Resolved pulmonary vascular  congestion. Aortic atherosclerosis. Cardiomegaly. Electronically Signed   By: Lorriane Shire M.D.   On: 02/21/2017 10:33        Scheduled Meds: . apixaban  10 mg Oral BID   Followed by  . [START ON 02/26/2017] apixaban  5 mg Oral BID  . furosemide  40 mg Oral BID  . loratadine  10 mg Oral Daily  . metoprolol tartrate  25 mg Oral Q8H  . pantoprazole  40 mg Oral Daily  . polyethylene glycol  17 g Oral QHS   Continuous Infusions:    LOS: 5 days    Time spent: 35 minutes.     Hosie Poisson, MD [facility] Pager [phone]   If 7PM-7AM, please contact night-coverage [email or web address] Password [facility] 02/22/2017, 4:18 PM

## 2017-02-23 ENCOUNTER — Telehealth: Payer: Self-pay | Admitting: *Deleted

## 2017-02-23 ENCOUNTER — Inpatient Hospital Stay (HOSPITAL_COMMUNITY): Payer: PPO

## 2017-02-23 DIAGNOSIS — Z7901 Long term (current) use of anticoagulants: Secondary | ICD-10-CM

## 2017-02-23 LAB — CBC
HCT: 37.2 % — ABNORMAL LOW (ref 39.0–52.0)
Hemoglobin: 11.8 g/dL — ABNORMAL LOW (ref 13.0–17.0)
MCH: 30.5 pg (ref 26.0–34.0)
MCHC: 31.7 g/dL (ref 30.0–36.0)
MCV: 96.1 fL (ref 78.0–100.0)
PLATELETS: 112 10*3/uL — AB (ref 150–400)
RBC: 3.87 MIL/uL — ABNORMAL LOW (ref 4.22–5.81)
RDW: 13.5 % (ref 11.5–15.5)
WBC: 5.4 10*3/uL (ref 4.0–10.5)

## 2017-02-23 LAB — BASIC METABOLIC PANEL
Anion gap: 10 (ref 5–15)
BUN: 43 mg/dL — AB (ref 6–20)
CALCIUM: 8.6 mg/dL — AB (ref 8.9–10.3)
CO2: 24 mmol/L (ref 22–32)
CREATININE: 1.65 mg/dL — AB (ref 0.61–1.24)
Chloride: 107 mmol/L (ref 101–111)
GFR calc Af Amer: 43 mL/min — ABNORMAL LOW (ref >60)
GFR, EST NON AFRICAN AMERICAN: 37 mL/min — AB (ref >60)
GLUCOSE: 108 mg/dL — AB (ref 65–99)
Potassium: 4.1 mmol/L (ref 3.5–5.1)
Sodium: 141 mmol/L (ref 135–145)

## 2017-02-23 MED ORDER — FUROSEMIDE 40 MG PO TABS: 40.0000 mg | ORAL_TABLET | Freq: Every day | ORAL | Status: DC

## 2017-02-23 MED ORDER — METOPROLOL SUCCINATE ER 25 MG PO TB24: 75.0000 mg | ORAL_TABLET | Freq: Every day | ORAL | Status: DC

## 2017-02-23 MED ORDER — METOPROLOL SUCCINATE ER 25 MG PO TB24: 75.0000 mg | ORAL_TABLET | Freq: Every day | ORAL | 0 refills | Status: DC

## 2017-02-23 MED ORDER — FUROSEMIDE 40 MG PO TABS: 40.0000 mg | ORAL_TABLET | Freq: Every day | ORAL | 0 refills | Status: DC

## 2017-02-23 NOTE — Telephone Encounter (Signed)
Moved pt's appt up due to [facility] visit.  Stated wanted one week but did not have anything.  Stated would call pt if office has cancellation.

## 2017-02-23 NOTE — Progress Notes (Signed)
Physical Therapy Treatment Patient Details Name: Jimmy Myers MRN: [id number] DOB: Dec 23, 1931 Today's Date: 02/23/2017    History of Present Illness Patient is an 83 yo male admitted 02/17/17 with 3-4 days of SOB.  Patient with acute respiratory failure with hypoxia due to PE and acute on chronic HF.  Patient with EF of 20%, and with Afib with RVR.    PMH:  CKD, anxiety, CHF, HTN, Afib, bradycardia    PT Comments    Patient reports not feeling great today describing weakness and legs feeling like they are going to give out. Tolerated gait training with use of RW for support. Discussed importance of using RW at home for safety and to decrease fall risk. Pt reluctant. Although pt declining HHPT, discussed exercise, walking program to improve strength and endurance at home. 2/4 DOE with activity today and pt fatigues. HR ranged from 90-109 bpms. Will continue to follow.  Follow Up Recommendations  Supervision for mobility/OOB;Home health PT (pt declining HHPT)     Equipment Recommendations  None recommended by PT    Recommendations for Other Services       Precautions / Restrictions Precautions Precautions: Fall Precaution Comments: 2 falls in last 6 months (in store, and in bathroom) Restrictions Weight Bearing Restrictions: No    Mobility  Bed Mobility Overal bed mobility: Independent                Transfers Overall transfer level: Modified independent Equipment used: Rolling walker (2 wheeled)             General transfer comment: Stood from EOB x1, without assist.   Ambulation/Gait Ambulation/Gait assistance: Supervision Ambulation Distance (Feet): 100 Feet Assistive device: Rolling walker (2 wheeled) Gait Pattern/deviations: Step-through pattern;Decreased step length - right;Decreased step length - left;Decreased stride length;Decreased dorsiflexion - right;Shuffle;Trunk flexed Gait velocity: decreased Gait velocity interpretation: Below normal speed for  age/gender General Gait Details: 2 standing rest breaks; 2/4 DOE. HR ranged from 90s-109 bpm. Cues for upright. Fatigue reported.   Stairs            Wheelchair Mobility    Modified Rankin (Stroke Patients Only)       Balance Overall balance assessment: Needs assistance Sitting-balance support: No upper extremity supported;Feet supported Sitting balance-Leahy Scale: Good     Standing balance support: No upper extremity supported Standing balance-Leahy Scale: Fair Standing balance comment: Able to stand statically without UE support but does better with UE support during ambulation due to BLE weakness.                            Cognition Arousal/Alertness: Awake/alert Behavior During Therapy: WFL for tasks assessed/performed Overall Cognitive Status: Within Functional Limits for tasks assessed                                        Exercises      General Comments        Pertinent Vitals/Pain Pain Assessment: Faces Faces Pain Scale: Hurts a little bit Pain Location: right flank Pain Descriptors / Indicators: Sore Pain Intervention(s): Monitored during session;Repositioned;Limited activity within patient's tolerance    Home Living                      Prior Function            PT Goals (current goals  can now be found in the care plan section) Progress towards PT goals: Not progressing toward goals - comment (secondaryt o feeling weak and not great today.)    Frequency    Min 3X/week      PT Plan Current plan remains appropriate    Co-evaluation              AM-PAC PT "6 Clicks" Daily Activity  Outcome Measure  Difficulty turning over in bed (including adjusting bedclothes, sheets and blankets)?: None Difficulty moving from lying on back to sitting on the side of the bed? : None Difficulty sitting down on and standing up from a chair with arms (e.g., wheelchair, bedside commode, etc,.)?: None Help  needed moving to and from a bed to chair (including a wheelchair)?: A Little Help needed walking in hospital room?: A Little Help needed climbing 3-5 steps with a railing? : A Little 6 Click Score: 21    End of Session Equipment Utilized During Treatment: Gait belt Activity Tolerance: Patient limited by fatigue Patient left: in bed;with call bell/phone within reach Nurse Communication: Mobility status PT Visit Diagnosis: Unsteadiness on feet (R26.81);Other abnormalities of gait and mobility (R26.89);Muscle weakness (generalized) (M62.81);History of falling (Z91.81)     Time: 4970-2637 PT Time Calculation (min) (ACUTE ONLY): 21 min  Charges:  $Therapeutic Exercise: 8-22 mins                    G Codes:       Wray Kearns, PT, DPT [phone]     Marguarite Arbour A Estel Tonelli 02/23/2017, 9:30 AM

## 2017-02-23 NOTE — Progress Notes (Signed)
Progress Note  Patient Name: Jimmy Myers Date of Encounter: 02/23/2017  Primary Cardiologist: Dr. [name] / Truitt Merle, NP  Subjective   Weak and tired. No dyspnea or chest pain.   Inpatient Medications    Scheduled Meds: . apixaban  10 mg Oral BID   Followed by  . [START ON 02/26/2017] apixaban  5 mg Oral BID  . furosemide  40 mg Oral BID  . loratadine  10 mg Oral Daily  . metoprolol tartrate  25 mg Oral Q8H  . pantoprazole  40 mg Oral Daily  . polyethylene glycol  17 g Oral QHS   Continuous Infusions:  PRN Meds: acetaminophen **OR** acetaminophen, albuterol, clorazepate, fluticasone, promethazine, traZODone   Vital Signs    Vitals:   02/22/17 0502 02/22/17 1942 02/23/17 0355 02/23/17 0358  BP: 95/73 94/60 93/63    Pulse: 99 88 92   Resp: 18 18 17    Temp: 98.1 F (36.7 C) 97.4 F (36.3 C) 98 F (36.7 C)   TempSrc: Oral Oral Oral   SpO2: 95% 98% 99%   Weight: 88.9 kg (195 lb 14.4 oz)   88.7 kg (195 lb 9.6 oz)  Height:        Intake/Output Summary (Last 24 hours) at 02/23/17 0925 Last data filed at 02/23/17 0730  Gross per 24 hour  Intake              720 ml  Output             2410 ml  Net            -1690 ml   Filed Weights   02/21/17 0345 02/22/17 0502 02/23/17 0358  Weight: 89.2 kg (196 lb 9.6 oz) 88.9 kg (195 lb 14.4 oz) 88.7 kg (195 lb 9.6 oz)    Telemetry    afib at rate of 90-110s- Personally Reviewed  ECG    None  Physical Exam   GEN: chronically ill appearing no acute distress.   Neck:  mild JVD Cardiac: Ir Ir , no murmurs, rubs, or gallops.  Respiratory: Clear to auscultation bilaterally. GI: Soft, nontender, non-distended  MS: No edema; No deformity. Neuro:  Nonfocal  Psych: Normal affect   Labs    Chemistry Recent Labs Lab 02/18/17 0537  02/21/17 0151 02/22/17 0530 02/23/17 0333  NA 138  < > 140 140 141  K 3.8  < > 2.9* 4.3 4.1  CL 106  < > 102 105 107  CO2 26  < > 29 27 24   GLUCOSE 104*  < > 121* 108* 108*  BUN  26*  < > 32* 39* 43*  CREATININE 1.31*  < > 1.56* 1.55* 1.65*  CALCIUM 8.2*  < > 8.7* 8.8* 8.6*  PROT 5.2*  --   --   --   --   ALBUMIN 3.0*  --   --   --   --   AST 27  --   --   --   --   ALT 38  --   --   --   --   ALKPHOS 112  --   --   --   --   BILITOT 1.3*  --   --   --   --   GFRNONAA 49*  < > 40* 40* 37*  GFRAA 57*  < > 46* 46* 43*  ANIONGAP 6  < > 9 8 10   < > = values in this interval not displayed.  Hematology Recent Labs Lab 02/21/17 0151 02/22/17 0530 02/23/17 0333  WBC 4.9 4.6 5.4  RBC 3.78* 3.84* 3.87*  HGB 11.6* 12.0* 11.8*  HCT 36.4* 37.7* 37.2*  MCV 96.3 98.2 96.1  MCH 30.7 31.3 30.5  MCHC 31.9 31.8 31.7  RDW 13.5 13.8 13.5  PLT 110* 118* 112*    Cardiac Enzymes Recent Labs Lab 02/17/17 1819 02/17/17 2338 02/18/17 0537  TROPONINI 0.04* 0.04* 0.04*    Recent Labs Lab 02/17/17 1225  TROPIPOC 0.02     BNP Recent Labs Lab 02/17/17 1819  BNP 596.1*     DDimer No results for input(s): DDIMER in the last 168 hours.   Radiology    Dg Chest 2 View  Result Date: 02/23/2017 CLINICAL DATA:  83 year old male with shortness of Breath and weakness. EXAM: CHEST  2 VIEW COMPARISON:  02/21/2017 and earlier. FINDINGS: Small bilateral pleural effusions persist and may be mildly increased since 02/19/2017. Decreased pulmonary vascularity since that time. No overt edema. No pneumothorax. Lung base atelectasis. No consolidation. Stable cardiomegaly and mediastinal contours. Calcified aortic atherosclerosis. Visualized tracheal air column is within normal limits. No acute osseous abnormality identified. Negative visible bowel gas pattern. IMPRESSION: 1. Resolved pulmonary edema but continued and perhaps slightly increased bilateral pleural effusions since 02/19/2017. 2. Cardiomegaly.  Calcified aortic atherosclerosis. Electronically Signed   By: Genevie Ann M.D.   On: 02/23/2017 08:27    Cardiac Studies   Echo 02/19/17: Study Conclusions  - Left ventricle: The  cavity size was severely dilated. Systolic function was severely reduced. The estimated ejection fraction was in the range of 20% to 25%. Severe diffuse hypokinesis with regional variations. The study was not technically sufficient to allow evaluation of LV diastolic dysfunction due to atrial fibrillation. - Aortic valve: Moderately to severely calcified annulus. Trileaflet; mildly thickened, moderately calcified leaflets. Valve area (VTI): 1.92 cm^2. Valve area (Vmax): 1.77 cm^2. Valve area (Vmean): 2.15 cm^2. - Aorta: Aortic root dimension: 50 mm (ED). - Aortic root: The aortic root was severely dilated. - Left atrium: The atrium was moderately to severely dilated. - Right ventricle: The cavity size was mildly dilated. Wall thickness was normal. - Pulmonary arteries: PA peak pressure: 34 mm Hg (S).  R LE venous Doppler 02/19/17: No evidence of deep vein thrombosis involving the right lower extremity. - No evidence of Baker&'s cyst on the right. - Non-thrombosed varicose veins noted in the right calf.   Patient Profile     83 y.o. male with chronic systolic and diastolic heart failure, chronic atrial fibrillation, SSS, hypertension, (Sanchez moderate ascending aortic aneurysm, and cholangiocarcinoma undergoing XRT here with acute on chronic heart failure and atrial fibrillation with RVR in the setting of newly diagnosed pulmonary embolism.   Assessment & Plan    1.  Chronic atrial fibrillation with RVR/ SSS:   - rate in 90-110s. Hx of bradycardia on metoprolol in past. Continue Eliquis and lopressor 25mg  BID.   2. Acute on chronic systolic and diastolic heart failure:  Systolic function was 34-19% but improved to 40-45% on his most recent echo 12/2015. However systolic function was reduced back to 20-25% this admission.  - Not on ACE/ARB/ARNI due to AKI. Scr worse further. Volume status stable. Weight stable.   3. Pulmonary embolism:  - No evidence of RV  dysfunction on echo. On Eliquis.   4. Moderate ascending aorta aneurysm: Ascending aorta was 4.8 cm by CT, which increased from 4.5 cm in 2017. Followed up with [facility] as outpatient.  5. Cholangiocarcinoma:  - Currently on XRT.  Management per primary team/oncology.  Signed, Leanor Kail, PA  02/23/2017, 9:25 AM

## 2017-02-23 NOTE — Progress Notes (Signed)
ASSUMED CARE FROM GOING RN @ [phone]; PATIENT ALERT & ORIENTED; DENIES CHEST PAIN; AMBULATES IN ROOM AND HALLWAY WITH NO C/O'S SOB; PATIENT BEING DISCHARGE WITH INSTRUCTIONS AND VOLUNTEER TAKEN OUT

## 2017-02-24 NOTE — Discharge Summary (Signed)
Physician Discharge Summary  [name] MGN:[id number] DOB: [birth year] DOA: 02/17/2017  PCP: [name], MD  Admit date: 02/17/2017 Discharge date: 02/23/2017  Admitted From: Home.  Disposition:  Home.   Recommendations for Outpatient Follow-up:  Follow up with PCP in 1-2 weeks Please obtain BMP/CBC in one week Please follow up with cardiology in one week to check BMP  Follow up with cardiothoracic surgery as outpatient in one week for thoracic aorta aneurysm dilatation.    Home Health:yes. Pt refused.   Discharge Condition:stable.  CODE STATUS:full code.  Diet recommendation: Heart Healthy  Brief/Interim Summary: Jimmy Myers a 83 y.o.malewith medical history significant of a defibrillation, anxiety, atrial thrombus, lymphoma, CHF, GERD, hypertension, cholangiocarcinoma,  Patient presented with 3-4 day history of shortness of breath.on arrival to ED, he was found to have A fib with RVR and PE.   Discharge Diagnoses:  Active Problems:   HTN (hypertension), benign   Cholangiocarcinoma ([place])   Pulmonary embolism (HCC)   Chronic combined systolic (congestive) and diastolic (congestive) heart failure (HCC)   Acute respiratory failure (HCC)  Acute respiratory failure with hypoxia secondary to Pulmonary embolus and acute on chronic systolic and diastolic heart failure. Started him on IV heparin , transitioned to eliquis.  Nasal canula oxygen as needed to keep sats greater than 90%.  Echocardiogram  Shows worsening of LVEF to 20%.  Started on IV lasix and called cardiology consult.  As his creatinine worsened with IV lasix, we transitioned to po lasix on discharge he will need a BMP to monitor his creatinine.   CXR shows CHF with pleural effusions on admission, repeat CXR after the IV lasix,  shows resolution of pulmonary congestion, persistent pleural effusions.  PT evaluation recommending home health PT. Pt refusing it .     H/o Cholangiocarcinoma:  Outpatient  follow up with Dr Irene Limbo.   Afib with RVR:  RATE is better. cardizem stopped as his EF IS 20%. Changed to coreg. TSH wnl.  Mildly elevated troponin's possibly from the afib with RVR.  On eliquis.    Mild thrombocytopenia: improved to 118,000. No evidence of bleeding.    Stage 2 CKD:  His baseline creatinine is 1.2, on discharge it is up at 1.6, recommend checking the bmp in one week.  We have cut down the lasix to 40 mg daily from 40 mg BID.   Aneurysmal dilatation ascending thoracic aorta 4.8 cm transverse Unchanged Semi annual follow up with an [place].  Follow up with cardiothoracic surgery as outpatient in one week.     Mild normocytic anemia: baseline hemoglobin around 11. .  Stable.    Hypokalemia: repleted. Mag is normal.     Discharge Instructions  Discharge Instructions    Diet - low sodium heart healthy    Complete by:  As directed    Discharge instructions    Complete by:  As directed    PLEASE follow up with cardiology in one week, get bmp done at office visit.     Allergies as of 02/23/2017      Reactions   Codeine Other (See Comments)   nervousness   Simvastatin Other (See Comments)   Muscle pain/hurting   Vicodin [hydrocodone-acetaminophen] Other (See Comments)   nervousness      Medication List    STOP taking these medications   traZODone 50 MG tablet Commonly known as:  DESYREL     TAKE these medications   acetaminophen 325 MG tablet Commonly known as:  TYLENOL Take  325 mg by mouth every 6 (six) hours as needed.   albuterol 108 (90 Base) MCG/ACT inhaler Commonly known as:  PROVENTIL HFA;VENTOLIN HFA Inhale 2 puffs into the lungs every 6 (six) hours as needed for wheezing or shortness of breath.   apixaban 5 MG Tabs tablet Commonly known as:  ELIQUIS Take 2 tablets (10 mg total) by mouth 2 (two) times daily.   apixaban 5 MG Tabs tablet Commonly known as:  ELIQUIS Take 1 tablet (5 mg total) by mouth 2 (two) times  daily. Start taking on:  02/26/2017   clorazepate 3.75 MG tablet Commonly known as:  TRANXENE Give .05 tablet by mouth daily as needed for anxiety   fluticasone 50 MCG/ACT nasal spray Commonly known as:  FLONASE Place 1 spray into both nostrils daily as needed for allergies or rhinitis.   folic acid 696 MCG tablet Commonly known as:  FOLVITE Take 800 mcg by mouth daily.   furosemide 40 MG tablet Commonly known as:  LASIX Take 1 tablet (40 mg total) by mouth daily. What changed:  See the new instructions.  Another medication with the same name was removed. Continue taking this medication, and follow the directions you see here.   metoprolol succinate 25 MG 24 hr tablet Commonly known as:  TOPROL-XL Take 3 tablets (75 mg total) by mouth daily.   nitroGLYCERIN 0.4 MG SL tablet Commonly known as:  NITROSTAT Place 1 tablet (0.4 mg total) under the tongue every 5 (five) minutes x 3 doses as needed for chest pain.   omeprazole 20 MG capsule Commonly known as:  PRILOSEC Take 20 mg by mouth daily before breakfast.   polyethylene glycol packet Commonly known as:  MIRALAX / GLYCOLAX Take 17 g by mouth at bedtime as needed.   potassium chloride 10 MEQ tablet Commonly known as:  K-DUR Take 1 tablet (10 mEq total) by mouth daily.      Follow-up Information    [name], MD. Schedule an appointment as soon as possible for a visit in 1 week(s).   Specialty:  Family Medicine Contact information: [address] [place] [address] [phone]          Allergies  Allergen Reactions  . Codeine Other (See Comments)    nervousness  . Simvastatin Other (See Comments)    Muscle pain/hurting  . Vicodin [Hydrocodone-Acetaminophen] Other (See Comments)    nervousness    Consultations:  cardiology   Procedures/Studies: Dg Chest 2 View  Result Date: 02/23/2017 CLINICAL DATA:  83 year old male with shortness of Breath and weakness. EXAM: CHEST  2 VIEW  COMPARISON:  02/21/2017 and earlier. FINDINGS: Small bilateral pleural effusions persist and may be mildly increased since 02/19/2017. Decreased pulmonary vascularity since that time. No overt edema. No pneumothorax. Lung base atelectasis. No consolidation. Stable cardiomegaly and mediastinal contours. Calcified aortic atherosclerosis. Visualized tracheal air column is within normal limits. No acute osseous abnormality identified. Negative visible bowel gas pattern. IMPRESSION: 1. Resolved pulmonary edema but continued and perhaps slightly increased bilateral pleural effusions since 02/19/2017. 2. Cardiomegaly.  Calcified aortic atherosclerosis. Electronically Signed   By: Genevie Ann M.D.   On: 02/23/2017 08:27   Dg Chest 2 View  Result Date: 02/19/2017 CLINICAL DATA:  Shortness of breath. Acute pulmonary embolism. History of atrial fibrillation, chronic CHF, cholangiocarcinoma. EXAM: CHEST  2 VIEW COMPARISON:  Chest x-ray and chest CT scan of February 17, 2017 FINDINGS: The lungs are well-expanded. The interstitial markings are more conspicuous today. The pulmonary  vascularity remains engorged. The cardiac silhouette remains enlarged. There small bilateral pleural effusions which are stable. There is calcification in the wall of the thoracic aorta. There is multilevel degenerative disc disease of the thoracic spine. IMPRESSION: CHF with mild interstitial edema and small bilateral pleural effusions. No acute pneumonia. Thoracic aortic atherosclerosis. Electronically Signed   By: David  Martinique M.D.   On: 02/19/2017 08:58   Dg Chest 2 View  Result Date: 02/17/2017 CLINICAL DATA:  3-4 days of shortness of breath. No chest pain. History of CHF, atrial fibrillation. EXAM: CHEST  2 VIEW COMPARISON:  Chest CT scan of October 31, 2016 and chest x-ray of Jan 06, 2012. FINDINGS: The lungs remain mildly hyperinflated. There are small bilateral pleural effusions layering posteriorly which are slightly more conspicuous than on the  previous study. The cardiac silhouette is enlarged. The pulmonary vascularity is engorged. There is calcification in the wall of the thoracic aorta. The bony thorax exhibits no acute abnormality. IMPRESSION: Hyperinflation consistent with chronic bronchitis or reactive airway disease, stable. Cardiomegaly, pulmonary vascular congestion, and mild pulmonary interstitial edema consistent with CHF. Small bilateral pleural effusions more conspicuous than on the previous study. Thoracic aortic atherosclerosis. Electronically Signed   By: David  Martinique M.D.   On: 02/17/2017 13:14   Ct Angio Chest Pe W/cm &/or Wo Cm  Result Date: 02/17/2017 CLINICAL DATA:  Shortness of breath for 4 days, atrial fibrillation, no chest pain; history systolic CHF, hypertension EXAM: CT ANGIOGRAPHY CHEST WITH CONTRAST TECHNIQUE: Multidetector CT imaging of the chest was performed using the standard protocol during bolus administration of intravenous contrast. Multiplanar CT image reconstructions and MIPs were obtained to evaluate the vascular anatomy. CONTRAST:  80 cc Isovue 370 IV COMPARISON:  None. FINDINGS: Cardiovascular: Atherosclerotic calcifications aorta and coronary arteries. Aneurysmal dilatation ascending thoracic aorta 4.8 cm transverse image 78 unchanged. No pericardial effusion. Dilatation of LEFT ventricle. Pulmonary arteries well opacified. Few scattered respiratory motion artifacts. Small filling defect identified within a posterior RIGHT lower lobe pulmonary artery compatible pulmonary embolism. No additional pulmonary emboli identified. No RIGHT ventricular dilatation. Mediastinum/Nodes: Large LEFT thyroid mass again identified 5.3 x 4.2 cm, measured 5.7 x 3.6 cm on 09/17/2015. No thoracic adenopathy. Esophagus unremarkable. Lungs/Pleura: Small BILATERAL pleural effusions with compressive atelectasis of the lower lobes. Central peribronchial thickening. No definite infiltrate or pneumothorax. Upper Abdomen: Pneumobilia  unchanged. Remaining visualized upper abdomen unremarkable. Musculoskeletal: Diffuse osseous demineralization. Scattered degenerative disc disease changes thoracic spine. Review of the MIP images confirms the above findings. IMPRESSION: Small pulmonary embolus in a RIGHT lower lobe pulmonary artery with small BILATERAL pleural effusions with compressive atelectasis of the posterior lower lobes. Stable LEFT thyroid mass. Coronary arterial calcification. Aneurysmal dilatation ascending thoracic aorta 4.8 cm transverse unchanged, recommendation below. Ascending thoracic aortic aneurysm. Recommend semi-annual imaging followup by CTA or MRA and referral to cardiothoracic surgery if not already obtained. This recommendation follows 2010 ACCF/AHA/AATS/ACR/ASA/SCA/SCAI/SIR/STS/SVM Guidelines for the Diagnosis and Management of Patients With Thoracic Aortic Disease. Circulation. 2010; 121: W098-J191 Aortic Atherosclerosis (ICD10-I70.0). Aortic aneurysm NOS (ICD10-I71.9). Critical Value/emergent results were called by telephone at the time of interpretation on 02/17/2017 at 2:29 pm to Dr. Quintella Reichert , who verbally acknowledged these results. Electronically Signed   By: Lavonia Dana M.D.   On: 02/17/2017 14:29   Dg Chest Port 1 View  Result Date: 02/21/2017 CLINICAL DATA:  Congestive heart failure. Acute respiratory failure. EXAM: PORTABLE CHEST 1 VIEW COMPARISON:  02/19/2017 and 02/17/2017 FINDINGS: There is cardiomegaly with tortuosity and calcification  of the thoracic aorta. Small right pleural effusion appears slightly more prominent. Increased density at the lung bases posterior medially probably represents pleural effusions. The pulmonary vascularity is now normal. IMPRESSION: Bilateral pleural effusions, small but slightly increased on the right. Resolved pulmonary vascular congestion. Aortic atherosclerosis. Cardiomegaly. Electronically Signed   By: Lorriane Shire M.D.   On: 02/21/2017 10:33    Echocardiogram.     Subjective:  No chest pain or sob.  Pedal edema resolved.   Discharge Exam: Vitals:   02/22/17 1942 02/23/17 0355  BP: 94/60 93/63  Pulse: 88 92  Resp: 18 17  Temp: 97.4 F (36.3 C) 98 F (36.7 C)   Vitals:   02/22/17 0502 02/22/17 1942 02/23/17 0355 02/23/17 0358  BP: 95/73 94/60 93/63    Pulse: 99 88 92   Resp: 18 18 17    Temp: 98.1 F (36.7 C) 97.4 F (36.3 C) 98 F (36.7 C)   TempSrc: Oral Oral Oral   SpO2: 95% 98% 99%   Weight: 88.9 kg (195 lb 14.4 oz)   88.7 kg (195 lb 9.6 oz)  Height:        General: Pt is alert, awake, not in acute distress Cardiovascular: RRR, S1/S2 +, no rubs, no gallops Respiratory: CTA bilaterally, diminished at bases.  Abdominal: Soft, NT, ND, bowel sounds + Extremities: no edema, no cyanosis    The results of significant diagnostics from this hospitalization (including imaging, microbiology, ancillary and laboratory) are listed below for reference.     Microbiology: No results found for this or any previous visit (from the past 240 hour(s)).   Labs: BNP (last 3 results)  Recent Labs  02/17/17 1819  BNP 599.3*   Basic Metabolic Panel:  Recent Labs Lab 02/17/17 1819  02/19/17 0306 02/20/17 1055 02/21/17 0151 02/21/17 0737 02/22/17 0530 02/23/17 0333  NA  --   < > 138 140 140  --  140 141  K  --   < > 4.3 3.9 2.9*  --  4.3 4.1  CL  --   < > 107 106 102  --  105 107  CO2  --   < > 25 26 29   --  27 24  GLUCOSE  --   < > 106* 114* 121*  --  108* 108*  BUN  --   < > 29* 29* 32*  --  39* 43*  CREATININE  --   < > 1.46* 1.43* 1.56*  --  1.55* 1.65*  CALCIUM  --   < > 8.3* 8.6* 8.7*  --  8.8* 8.6*  MG 2.0  --   --   --   --  1.9  --   --   < > = values in this interval not displayed. Liver Function Tests:  Recent Labs Lab 02/18/17 0537  AST 27  ALT 38  ALKPHOS 112  BILITOT 1.3*  PROT 5.2*  ALBUMIN 3.0*   No results for input(s): LIPASE, AMYLASE in the last 168 hours. No results for input(s): AMMONIA in the  last 168 hours. CBC:  Recent Labs Lab 02/19/17 0306 02/20/17 0238 02/21/17 0151 02/22/17 0530 02/23/17 0333  WBC 5.3 4.6 4.9 4.6 5.4  HGB 11.0* 10.8* 11.6* 12.0* 11.8*  HCT 34.6* 33.5* 36.4* 37.7* 37.2*  MCV 97.7 96.5 96.3 98.2 96.1  PLT 84* 89* 110* 118* 112*   Cardiac Enzymes:  Recent Labs Lab 02/17/17 1819 02/17/17 2338 02/18/17 0537  TROPONINI 0.04* 0.04* 0.04*   BNP: Invalid input(s): POCBNP  CBG: No results for input(s): GLUCAP in the last 168 hours. D-Dimer No results for input(s): DDIMER in the last 72 hours. Hgb A1c No results for input(s): HGBA1C in the last 72 hours. Lipid Profile No results for input(s): CHOL, HDL, LDLCALC, TRIG, CHOLHDL, LDLDIRECT in the last 72 hours. Thyroid function studies No results for input(s): TSH, T4TOTAL, T3FREE, THYROIDAB in the last 72 hours.  Invalid input(s): FREET3 Anemia work up No results for input(s): VITAMINB12, FOLATE, FERRITIN, TIBC, IRON, RETICCTPCT in the last 72 hours. Urinalysis    Component Value Date/Time   COLORURINE YELLOW 12/28/2011 1658   APPEARANCEUR Clear 10/22/2016 1232   LABSPEC 1.012 12/28/2011 1658   PHURINE 5.5 12/28/2011 1658   GLUCOSEU Negative 10/22/2016 1232   HGBUR SMALL (A) 12/28/2011 1658   BILIRUBINUR Positive (A) 10/22/2016 1232   KETONESUR NEGATIVE 12/28/2011 1658   PROTEINUR Negative 10/22/2016 1232   PROTEINUR NEGATIVE 12/28/2011 1658   UROBILINOGEN 1.0 12/28/2011 1658   NITRITE Negative 10/22/2016 1232   NITRITE NEGATIVE 12/28/2011 1658   LEUKOCYTESUR Negative 10/22/2016 1232   Sepsis Labs Invalid input(s): PROCALCITONIN,  WBC,  LACTICIDVEN Microbiology No results found for this or any previous visit (from the past 240 hour(s)).   Time coordinating discharge: Over 30 minutes  SIGNED:   Hosie Poisson, MD  [facility] 02/24/2017, 8:07 AM Pager   If 7PM-7AM, please contact night-coverage [email or web address] Password [email or web address]

## 2017-02-26 ENCOUNTER — Telehealth: Payer: Self-pay | Admitting: *Deleted

## 2017-02-26 ENCOUNTER — Ambulatory Visit (INDEPENDENT_AMBULATORY_CARE_PROVIDER_SITE_OTHER): Payer: PPO | Admitting: Pediatrics

## 2017-02-26 ENCOUNTER — Encounter: Payer: Self-pay | Admitting: Pediatrics

## 2017-02-26 VITALS — BP 102/62 | HR 72 | Temp 96.2°F | Resp 22 | Ht 70.0 in | Wt 203.4 lb

## 2017-02-26 DIAGNOSIS — I480 Paroxysmal atrial fibrillation: Secondary | ICD-10-CM

## 2017-02-26 DIAGNOSIS — I2699 Other pulmonary embolism without acute cor pulmonale: Secondary | ICD-10-CM | POA: Diagnosis not present

## 2017-02-26 DIAGNOSIS — I5022 Chronic systolic (congestive) heart failure: Secondary | ICD-10-CM | POA: Diagnosis not present

## 2017-02-26 DIAGNOSIS — G479 Sleep disorder, unspecified: Secondary | ICD-10-CM

## 2017-02-26 MED ORDER — METOPROLOL SUCCINATE ER 25 MG PO TB24: 75.0000 mg | ORAL_TABLET | Freq: Every day | ORAL | 1 refills | Status: DC

## 2017-02-26 NOTE — Telephone Encounter (Signed)
S/w pt moved appointment up due to pt's request.

## 2017-02-26 NOTE — Patient Instructions (Signed)
Let me know if heart rate is over 110, if you have any lightheadedness or worsening symptoms

## 2017-02-26 NOTE — Progress Notes (Signed)
  Subjective:   Patient ID: Jimmy Myers, male    DOB: 07-17-1932, 83 y.o.   MRN: [id number] CC: Trouble sleeping and Shortness of Breath  HPI: Jimmy Myers is a 83 y.o. male presenting for Trouble sleeping and Shortness of Breath  Here today with his wife  Recently hospitalized for PE, Afib with RVR, EF 20%, on apixaban H/o cholangiocarcinoma, recently through radiation therapy Still with some SOB, especially during activity Feels fine at rest Was given Rx for trazodone several weeks ago Never started it Told to stop it in the hospital Takes naps multiple times during the day Has a hard time sleeping at night now  No CP No swelling in feet  Declined HH PT in the hospital Declines again today Says he feels more weak since discharge compared to before hospitalization Discussed benefits of PT, pt says he knows the exercises to do and will do them  Ears have been itching b/l  Relevant past medical, surgical, family and social history reviewed. Allergies and medications reviewed and updated. History  Smoking Status  . Never Smoker  Smokeless Tobacco  . Never Used   ROS: Per HPI   Objective:    BP 102/62   Pulse 72   Temp (!) 96.2 F (35.7 C) (Oral)   Resp (!) 22   Ht 5\' 10"  (1.778 m)   Wt 203 lb 6.4 oz (92.3 kg)   SpO2 98%   BMI 29.18 kg/m   Wt Readings from Last 3 Encounters:  02/26/17 203 lb 6.4 oz (92.3 kg)  02/23/17 195 lb 9.6 oz (88.7 kg)  02/17/17 203 lb 12.8 oz (92.4 kg)    Gen: NAD, alert, cooperative with exam, in wheelchair EYES: EOMI, no conjunctival injection, or no icterus ENT:  TMs pearly gray b/l, small amount of non-obstructing wax present, OP without erythema LYMPH: no cervical LAD CV: irregular, normal rate, normal S1/S2, no JVD Resp: CTABL, no wheezes, normal WOB Abd: +BS, soft, NTND. Ext: No edema, warm Neuro: Alert and oriented  Assessment & Plan:  Jimmy Myers was seen today for trouble sleeping and shortness of breath.  Diagnoses and all  orders for this visit:  Paroxysmal atrial fibrillation ([place]) Normal rate, on eliquis  Trouble in sleeping Napping during day, discussed sleep hygiene  Chronic systolic heart failure (HCC) Appears euvolemic, cont current meds -     metoprolol succinate (TOPROL-XL) 25 MG 24 hr tablet; Take 3 tablets (75 mg total) by mouth daily.  Other pulmonary embolism without acute cor pulmonale, unspecified chronicity (HCC) Likely contributing to SOB feeling with exertion, euvolemic, no CP, O2 sats nl Cont eliquis  Follow up plan: Next week as scheduled, sooner if needed Discussed return precautions. Assunta Found, MD [place]

## 2017-02-27 ENCOUNTER — Telehealth: Payer: Self-pay | Admitting: Nurse Practitioner

## 2017-02-27 ENCOUNTER — Telehealth: Payer: Self-pay | Admitting: *Deleted

## 2017-02-27 NOTE — Telephone Encounter (Signed)
Returned call to patient, spoke to him and wife.   Patient concerned as he has been nauseous x 3 days.  Concerned this may be coming from his metoprolol.   Reports also reports SOB has continued but is better than in the hospital, weight his AM was 199 lbs (203 lbs at PCP yesterday). Denies vomiting, sick contacts, fever, chills.   Patient is monitoring salt intake, fluid intake, wearing compression stockings.  Bp this AM 107/72 HR 75.   Saw PCP yesterday and will be seeing him again on Monday-they advised wife that the continued SOB is from the recent diagnosis of PE  (note incomplete in epic).    Appt on Tuesday with Truitt Merle NP 7/3 TOC.   Advised I do not think nausea is coming from this medication as he has taken in the past without side effects.  Advised I would route for review and recommendations.   Patient aware and verbalized understanding.

## 2017-02-27 NOTE — Telephone Encounter (Signed)
Patient aware and verbalized understanding. °

## 2017-02-27 NOTE — Telephone Encounter (Signed)
New Message    Pt c/o medication issue:  1. Name of Medication:  metoprolol succinate (TOPROL-XL) 25 MG 24 hr tablet Take 3 tablets (75 mg total) by mouth daily.     2. How are you currently taking this medication (dosage and times per day)?  3 a day  3. Are you having a reaction (difficulty breathing--STAT)? Not sure   4. What is your medication issue?  Pt concerned with not urinating , the hospital stopped his fluid pill and added  metoprolol succinate (TOPROL-XL) 25 MG 24 hr tablet Take 3 tablets (75 mg total) by mouth daily.

## 2017-02-27 NOTE — Telephone Encounter (Signed)
Agree not likely metoprolol causing nausea.

## 2017-02-27 NOTE — Telephone Encounter (Signed)
I doubt his nausea is coming from metoprolol. He has been on this before. Would see PCP as planned and me as planned.  Thanks  Cecille Rubin

## 2017-02-27 NOTE — Telephone Encounter (Addendum)
Returned call fspoke with the patient, he has been nauseated past 3-4 days, has changed medication stated, Metoprolol< that is a side effect of medication told to the patient, should not be getting any nausea from the radiation this far out from May, he will call his Dr. Who put him on the metoprolol, thanked this RN fro returing his call 9:42 AM

## 2017-03-02 ENCOUNTER — Encounter: Payer: Self-pay | Admitting: Pediatrics

## 2017-03-02 ENCOUNTER — Ambulatory Visit (INDEPENDENT_AMBULATORY_CARE_PROVIDER_SITE_OTHER): Payer: PPO | Admitting: Pediatrics

## 2017-03-02 VITALS — BP 108/66 | HR 78 | Temp 97.4°F | Resp 20 | Ht 70.0 in | Wt 208.4 lb

## 2017-03-02 DIAGNOSIS — C221 Intrahepatic bile duct carcinoma: Secondary | ICD-10-CM

## 2017-03-02 DIAGNOSIS — Z09 Encounter for follow-up examination after completed treatment for conditions other than malignant neoplasm: Secondary | ICD-10-CM

## 2017-03-02 DIAGNOSIS — H9190 Unspecified hearing loss, unspecified ear: Secondary | ICD-10-CM | POA: Diagnosis not present

## 2017-03-02 DIAGNOSIS — I2699 Other pulmonary embolism without acute cor pulmonale: Secondary | ICD-10-CM | POA: Diagnosis not present

## 2017-03-02 DIAGNOSIS — I5020 Unspecified systolic (congestive) heart failure: Secondary | ICD-10-CM

## 2017-03-02 MED ORDER — FUROSEMIDE 40 MG PO TABS: 60.0000 mg | ORAL_TABLET | Freq: Every day | ORAL | 2 refills | Status: DC

## 2017-03-02 NOTE — Patient Instructions (Addendum)
Call [place] to set up appointment to get your hearing tested  Take whole tab lasix (40mg ) when you get home today  Tomorrow start back to 60mg  (1.5 pills) of lasix (furosemide)

## 2017-03-02 NOTE — Progress Notes (Signed)
  Subjective:   Patient ID: Jimmy Myers, male    DOB: [birth year], 83 y.o.   MRN: [id number] CC: Hospitalization Follow-up (CHF, )  HPI: Jimmy Myers is a 83 y.o. male presenting for Hospitalization Follow-up (CHF, ) here today with his wife, pt provides most of history  Having ringing/muffled sounds in his L ear  Says washing ear out in the past helped No earwax in ear  CHF: usual dry weight 194-196 This morning was 199 lb at home which is high for him Some more swelling in lower ext Wearing compression hose daily Prior to hospitalization taking '60mg'$  lasix daily Since discharge has been on '40mg'$  daily Has appt with cardiology tomorrow  PE: taking '5mg'$  eliquis bid No bleeding SOB no worse  Afib: no heart palpitations since last visit, no CP  Relevant past medical, surgical, family and social history reviewed. Allergies and medications reviewed and updated. History  Smoking Status  . Never Smoker  Smokeless Tobacco  . Never Used   ROS: Per HPI   Objective:    BP 108/66   Pulse 78   Temp 97.4 F (36.3 C) (Oral)   Resp 20   Ht '5\' 10"'$  (1.778 m)   Wt 208 lb 6.4 oz (94.5 kg)   SpO2 98%   BMI 29.90 kg/m   Wt Readings from Last 3 Encounters:  03/02/17 208 lb 6.4 oz (94.5 kg)  02/26/17 203 lb 6.4 oz (92.3 kg)  02/23/17 195 lb 9.6 oz (88.7 kg)    Gen: NAD, alert, walking with walker, not in wheelchair today, cooperative with exam, NCAT EYES: EOMI, no conjunctival injection, or no icterus ENT:  TMs pearly gray b/l, no wax in L ear canal, minimal non-obstructing cerumen present R ear canal, OP without erythema LYMPH: no cervical LAD CV: NRRR, normal S1/S2 Resp: CTABL, no wheezes, normal WOB Abd: +BS, soft, NTND. no guarding or organomegaly Ext: trace to 1+ pitting edema, warm Neuro: Alert and oriented  Assessment & Plan:  Jimmy Myers was seen today for hospitalization follow-up.  Diagnoses and all orders for this visit:  Hospital discharge follow-up -     BMP8+EGFR -      CBC with Differential  Systolic congestive heart failure, unspecified HF chronicity (HCC) Weight up 3-5 lbs from baseline, more swelling in LE per pt than usual Take extra '40mg'$  lasix this afternoon Tomorrow AM take '60mg'$  Cont daily weights F/u with cardiology as scheduled  Cholangiocarcinoma ([place]) Has appt 2 weeks with oncology, appetite has been fine, no abd pain  Other pulmonary embolism without acute cor pulmonale, unspecified chronicity ([place]) Cont eliquis  Hearing problem, unspecified laterality Symptoms come and go, sounds loud at times, not a ringing sound, like he needs to pop his ear Normal exam  Happening more often Pt to use flonase daily, already has at home Is going to talk to the [place] about getting hearing tested if ongoing symptoms.   Follow up plan: Return in about 4 weeks (around 03/30/2017). Assunta Found, MD [place]

## 2017-03-03 ENCOUNTER — Encounter: Payer: Self-pay | Admitting: Nurse Practitioner

## 2017-03-03 ENCOUNTER — Ambulatory Visit (INDEPENDENT_AMBULATORY_CARE_PROVIDER_SITE_OTHER): Payer: PPO | Admitting: Nurse Practitioner

## 2017-03-03 ENCOUNTER — Other Ambulatory Visit: Payer: Self-pay

## 2017-03-03 VITALS — BP 110/62 | HR 66 | Ht 70.0 in | Wt 207.8 lb

## 2017-03-03 DIAGNOSIS — I480 Paroxysmal atrial fibrillation: Secondary | ICD-10-CM | POA: Diagnosis not present

## 2017-03-03 DIAGNOSIS — R0602 Shortness of breath: Secondary | ICD-10-CM | POA: Diagnosis not present

## 2017-03-03 DIAGNOSIS — I5022 Chronic systolic (congestive) heart failure: Secondary | ICD-10-CM

## 2017-03-03 LAB — CBC WITH DIFFERENTIAL/PLATELET
BASOS ABS: 0 10*3/uL (ref 0.0–0.2)
Basos: 0 %
EOS (ABSOLUTE): 0.1 10*3/uL (ref 0.0–0.4)
Eos: 1 %
HEMOGLOBIN: 12.2 g/dL — AB (ref 13.0–17.7)
Hematocrit: 37.2 % — ABNORMAL LOW (ref 37.5–51.0)
Immature Grans (Abs): 0 10*3/uL (ref 0.0–0.1)
Immature Granulocytes: 1 %
LYMPHS ABS: 0.9 10*3/uL (ref 0.7–3.1)
Lymphs: 14 %
MCH: 31.2 pg (ref 26.6–33.0)
MCHC: 32.8 g/dL (ref 31.5–35.7)
MCV: 95 fL (ref 79–97)
MONOCYTES: 13 %
MONOS ABS: 0.9 10*3/uL (ref 0.1–0.9)
Neutrophils Absolute: 4.6 10*3/uL (ref 1.4–7.0)
Neutrophils: 71 %
PLATELETS: 116 10*3/uL — AB (ref 150–379)
RBC: 3.91 x10E6/uL — AB (ref 4.14–5.80)
RDW: 13.9 % (ref 12.3–15.4)
WBC: 6.5 10*3/uL (ref 3.4–10.8)

## 2017-03-03 LAB — BMP8+EGFR
BUN / CREAT RATIO: 32 — AB (ref 10–24)
BUN: 51 mg/dL — AB (ref 8–27)
CHLORIDE: 105 mmol/L (ref 96–106)
CO2: 22 mmol/L (ref 20–29)
Calcium: 9 mg/dL (ref 8.6–10.2)
Creatinine, Ser: 1.61 mg/dL — ABNORMAL HIGH (ref 0.76–1.27)
GFR calc Af Amer: 45 mL/min/{1.73_m2} — ABNORMAL LOW (ref >59)
GFR calc non Af Amer: 39 mL/min/{1.73_m2} — ABNORMAL LOW (ref >59)
GLUCOSE: 108 mg/dL — AB (ref 65–99)
Potassium: 4.8 mmol/L (ref 3.5–5.2)
Sodium: 142 mmol/L (ref 134–144)

## 2017-03-03 MED ORDER — FUROSEMIDE 40 MG PO TABS: 60.0000 mg | ORAL_TABLET | Freq: Every day | ORAL | 2 refills | Status: DC

## 2017-03-03 MED ORDER — FUROSEMIDE 40 MG PO TABS: ORAL_TABLET | ORAL | 2 refills | Status: DC

## 2017-03-03 MED ORDER — APIXABAN 5 MG PO TABS: 5.0000 mg | ORAL_TABLET | Freq: Two times a day (BID) | ORAL | 6 refills | Status: DC

## 2017-03-03 MED ORDER — METOPROLOL SUCCINATE ER 25 MG PO TB24: 75.0000 mg | ORAL_TABLET | Freq: Every day | ORAL | 6 refills | Status: AC

## 2017-03-03 MED ORDER — METOPROLOL SUCCINATE ER 25 MG PO TB24: 75.0000 mg | ORAL_TABLET | Freq: Every day | ORAL | 3 refills | Status: DC

## 2017-03-03 NOTE — Progress Notes (Signed)
CARDIOLOGY OFFICE NOTE  Date:  03/03/2017    Jimmy Myers Date of Birth: [birth year] Medical Record #[id number]  PCP:  [name], MD  Cardiologist:  [place]    Chief Complaint  Patient presents with  . Atrial Fibrillation  . Congestive Heart Failure    Post hospital visit - seen for Dr. [name]    History of Present Illness: Jimmy Myers is a 83 y.o. male who presents today for a post hospital visit. Seen for Dr. [name].   He has systolic heart failure with an EF previously down to 20 to 25% with improvement after medical therapy - up to 50% per echo in August of 2013 and was 45 to 50% by echo in July of 2014. His other issues include atrial fib with prior cardioversion, bradycardia, enlarged ascending aorta (previously followed by Dr. [name] and no longer measured), thyroid nodules (with prior benign biopsy), chronic thrombocytopenia and HTN. Had an event monitor in 2014 - average HR of 61. 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 Coumadin was previously followed by the [place].  I have seen him several times over the past few years - he has done ok. He has been reluctant to change his medicines. Echo updated in May of 2017 and was unchanged - EF 40 to 45% - we have continued with medical therapy. He has opted to not have any more imaging of his aorta. At visit in November of 2017, HR was lower and I stopped his beta blocker altogether.   I saw him in February of 2018 - he had had the flu - he was very jaundiced. LFTs high. Coumadin running high. He ended up getting CT scan abdomen/pelvis which showed moderate intrahepatic biliary ductal dilation, mild distention of the gallbladder, distention of the CBD suspicious for CBD or pancreatic carcinoma. ERCP was unsuccessful. External biliary drainage was placed by IR. CA 19-9 elevated. 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 He was referred for oncology. Was sent to SNF from the hospital. Noted to be a DNR. His coumadin was held.   I last saw him in April - his oncology plan was unclear to me. He had had some swelling - back on Lasix - lots of extra salt. Remained off his Coumadin due to his liver cancer.   Hospitalized last month with worsening shortness of breath - found to be back in AF with RVR - also with PE noted. He was placedon IV heparin and transitioned to Eliquis. Echo showed worsening EF - down to 20%. Persistent effusions on CXR noted. Looks like he was going to get referred back to [facility] for his aneurysm - he has told me in the past that he did not wish to do this and had no plans to go back.   Comes in today. Here with his wife. He is moving pretty slow. Looks pretty weak. He is pretty hard to understand. He is rambling.  Using a walker. He looks bad. Nauseated. He had called [place] and thought this was from Metoprolol which he has been on before - I was inclined to think it was not the Metoprolol. 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 Noted he was unsure of whether he would have chemo - awaiting follow up scans to assess response to XRT. His  HR has been difficult to control. He is not a candidate for digoxin or amiodarone or really AAD therapy. Management limited by known bradycardia in the past. She notes that he slept all the way here today - that is unusual for him. He has more swelling. He is short of breath.   Past Medical History:  Diagnosis Date  . A-fib [facility]) May 2013   s/p TEE/cardioversion June 2013  . Anxiety   . Aortic root enlargement (HCC)    at least 5cm by CT  . Arthritis   . Atrial thrombus   . Bradycardia   . Bronchitis   . Cancer ([place]) 2016   lymphoma  . Chronic anticoagulation    on coumadin - checked in [place]  . Chronic systolic heart failure ([place]) 12/28/2011  . Congestive heart failure (CHF)  ([place]) 2013  . Diverticulosis   . Dysrhythmia    AFib  . GERD (gastroesophageal reflux disease)   . Hemorrhoids   . HTN (hypertension)   . PE (pulmonary thromboembolism) ([place]) 01/2017  . Pneumonia May 2013  . Pneumonia 2013  . 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 Thrombocytopenia ([place])     Past Surgical History:  Procedure Laterality Date  . CARDIOVERSION  12/30/2011   Procedure: CARDIOVERSION;  Surgeon: Lelon Perla, MD;  Location: [facility] ENDOSCOPY;  Service: Cardiovascular;  Laterality: N/A;  . CARDIOVERSION  02/16/2012   Procedure: CARDIOVERSION;  Surgeon: Lelon Perla, MD;  Location: [facility] ENDOSCOPY;  Service: Cardiovascular;  Laterality: N/A;  . CATARACT EXTRACTION W/PHACO Left 07/28/2016   Procedure: CATARACT EXTRACTION PHACO AND INTRAOCULAR LENS PLACEMENT LEFT EYE:  CDE: 13.00;  Surgeon: Tonny Branch, MD;  Location: [facility] ORS;  Service: Ophthalmology;  Laterality: Left;  . ENDOSCOPIC RETROGRADE CHOLANGIOPANCREATOGRAPHY (ERCP) WITH PROPOFOL N/A 10/24/2016   Procedure: ENDOSCOPIC RETROGRADE CHOLANGIOPANCREATOGRAPHY (ERCP) WITH PROPOFOL;  Surgeon: Doran Stabler, MD;  Location: [place] ENDOSCOPY;  Service: Endoscopy;  Laterality: N/A;  . ESOPHAGOGASTRODUODENOSCOPY  12/30/2011   Procedure: ESOPHAGOGASTRODUODENOSCOPY (EGD);  Surgeon: Lelon Perla, MD;  Location: [facility] ENDOSCOPY;  Service: Cardiovascular;  Laterality: N/A;  . ESOPHAGOGASTRODUODENOSCOPY  12/30/2011   Procedure: ESOPHAGOGASTRODUODENOSCOPY (EGD);  Surgeon: Lafayette Dragon, MD;  Location: [facility] ENDOSCOPY;  Service: Endoscopy;  Laterality: N/A;  . IR BILIARY STENT(S) EXISTING ACCESS INC DILATION CATH EXCHANGE  12/16/2016  . IR GENERIC HISTORICAL  10/25/2016   IR INT EXT BILIARY DRAIN WITH CHOLANGIOGRAM 10/25/2016 Greggory Keen, MD [facility]-INTERV RAD  . IR GENERIC HISTORICAL  10/28/2016   IR EXCHANGE BILIARY  DRAIN 10/28/2016 Greggory Keen, MD [facility]-INTERV RAD  . IR GENERIC HISTORICAL  10/28/2016   IR ENDOLUMINAL BX OF BILIARY TREE 10/28/2016 Greggory Keen, MD [facility]-INTERV RAD  . IR GENERIC HISTORICAL  11/21/2016   IR EXCHANGE BILIARY DRAIN 11/21/2016 Corrie Mckusick, DO [facility]-INTERV RAD  . IR RADIOLOGIST EVAL & MGMT  12/03/2016  . IR RADIOLOGIST EVAL & MGMT  11/11/2016  . IR REMOVAL BILIARY DRAIN  12/30/2016  . KNEE ARTHROSCOPY  rt knee  . PROSTATE SURGERY    . TEE WITHOUT CARDIOVERSION  12/30/2011   Procedure: TRANSESOPHAGEAL ECHOCARDIOGRAM (TEE);  Surgeon: Lelon Perla, MD;  Location: [facility] ENDOSCOPY;  Service: Cardiovascular;  Laterality: N/A;  . TEE WITHOUT CARDIOVERSION  02/16/2012   Procedure: TRANSESOPHAGEAL ECHOCARDIOGRAM (TEE);  Surgeon: Lelon Perla, MD;  Location: [facility] ENDOSCOPY;  Service:  Cardiovascular;  Laterality: N/A;     Medications: Current Meds  Medication Sig  . acetaminophen (TYLENOL) 325 MG tablet Take 325 mg by mouth every 6 (six) hours as needed.  Marland Kitchen albuterol (PROVENTIL HFA;VENTOLIN HFA) 108 (90 BASE) MCG/ACT inhaler Inhale 2 puffs into the lungs every 6 (six) hours as needed for wheezing or shortness of breath.  Marland Kitchen apixaban (ELIQUIS) 5 MG TABS tablet Take 1 tablet (5 mg total) by mouth 2 (two) times daily.  . clorazepate (TRANXENE) 3.75 MG tablet Give .05 tablet by mouth daily as needed for anxiety  . fluticasone (FLONASE) 50 MCG/ACT nasal spray Place 1 spray into both nostrils daily as needed for allergies or rhinitis.  . folic acid (FOLVITE) 063 MCG tablet Take 800 mcg by mouth daily.  . furosemide (LASIX) 40 MG tablet Take 1.5 tablets (60 mg total) by mouth daily. Take 3 tablets today and tomorrow - then 2 tablets daily  . metoprolol succinate (TOPROL-XL) 25 MG 24 hr tablet Take 3 tablets (75 mg total) by mouth daily.  . nitroGLYCERIN (NITROSTAT) 0.4 MG SL tablet Place 1 tablet (0.4 mg total) under the tongue every 5 (five) minutes x 3 doses as needed for chest pain.  Marland Kitchen omeprazole  (PRILOSEC) 20 MG capsule Take 20 mg by mouth daily before breakfast.   . polyethylene glycol (MIRALAX / GLYCOLAX) packet Take 17 g by mouth at bedtime as needed.   . potassium chloride (K-DUR) 10 MEQ tablet Take 1 tablet (10 mEq total) by mouth daily.  . [DISCONTINUED] apixaban (ELIQUIS) 5 MG TABS tablet Take 1 tablet (5 mg total) by mouth 2 (two) times daily.  . [DISCONTINUED] furosemide (LASIX) 40 MG tablet Take 1.5 tablets (60 mg total) by mouth daily.  . [DISCONTINUED] metoprolol succinate (TOPROL-XL) 25 MG 24 hr tablet Take 3 tablets (75 mg total) by mouth daily.  . [DISCONTINUED] metoprolol succinate (TOPROL-XL) 25 MG 24 hr tablet Take 3 tablets (75 mg total) by mouth daily.     Allergies: Allergies  Allergen Reactions  . Codeine Other (See Comments)    nervousness  . Simvastatin Other (See Comments)    Muscle pain/hurting  . Vicodin [Hydrocodone-Acetaminophen] Other (See Comments)    nervousness    Social History: The patient  reports that he has never smoked. He has never used smokeless tobacco. He reports that he does not drink alcohol or use drugs.   Family History: The patient's family history includes Heart disease in his brother, father, mother, and sister; Hypertension in his sister; Stomach cancer in his brother.   Review of Systems: Please see the history of present illness.   Otherwise, the review of systems is positive for none.   All other systems are reviewed and negative.   Physical Exam: VS:  BP 110/62 (BP Location: Left Arm, Patient Position: Sitting, Cuff Size: Normal)   Pulse 66   Ht 5\' 10"  (1.778 m)   Wt 207 lb 12.8 oz (94.3 kg)   SpO2 98% Comment: at rest  BMI 29.82 kg/m  .  BMI Body mass index is 29.82 kg/m.  Wt Readings from Last 3 Encounters:  03/03/17 207 lb 12.8 oz (94.3 kg)  03/02/17 208 lb 6.4 oz (94.5 kg)  02/26/17 203 lb 6.4 oz (92.3 kg)    General: He looks chronically ill - looks jaundiced to me today. He is hard to understand -  chronically rambles - but alert and in no acute distress.  He is short of breath with even talking.  HEENT: Normal.  Neck: Supple, no JVD, carotid bruits, or masses noted.  Cardiac: Regular rhythm today. His rate is ok. Significant 2+ edema.  Respiratory:  Lungs are clear to auscultation bilaterally with normal work of breathing.  GI: Soft and nontender.  MS: No deformity or atrophy. Gait is quite slow - using a walker. ROM intact.  Skin: Warm and dry. Color is quite poor.  Neuro:  Strength and sensation are intact and no gross focal deficits noted.  Psych: Alert, appropriate and with normal affect.   LABORATORY DATA:  EKG:  EKG is ordered today. This demonstrates that he is back in NSR today - PVC noted. Non specific changes noted.   Lab Results  Component Value Date   WBC 6.5 03/02/2017   HGB 12.2 (L) 03/02/2017   HCT 37.2 (L) 03/02/2017   PLT 116 (L) 03/02/2017   GLUCOSE 108 (H) 03/02/2017   CHOL 85 12/29/2011   TRIG 30 12/29/2011   HDL 40 12/29/2011   LDLCALC 39 12/29/2011   ALT 38 02/18/2017   AST 27 02/18/2017   NA 142 03/02/2017   K 4.8 03/02/2017   CL 105 03/02/2017   CREATININE 1.61 (H) 03/02/2017   BUN 51 (H) 03/02/2017   CO2 22 03/02/2017   TSH 0.904 02/17/2017   INR 0.99 12/16/2016     BNP (last 3 results)  Recent Labs  02/17/17 1819  BNP 596.1*    ProBNP (last 3 results)  Recent Labs  12/08/16 1238 12/23/16 0953 01/12/17 0927  PROBNP 3,713* 5,245* 2,774*     Other Studies Reviewed Today: IMPRESSION: Small pulmonary embolus in a RIGHT lower lobe pulmonary artery with small BILATERAL pleural effusions with compressive atelectasis of the posterior lower lobes.  Stable LEFT thyroid mass.  Coronary arterial calcification.  Aneurysmal dilatation ascending thoracic aorta 4.8 cm transverse unchanged, recommendation below.  Ascending thoracic aortic aneurysm. Recommend semi-annual imaging followup by CTA or MRA and referral to  cardiothoracic surgery if not already obtained. This recommendation follows 2010 ACCF/AHA/AATS/ACR/ASA/SCA/SCAI/SIR/STS/SVM Guidelines for the Diagnosis and Management of Patients With Thoracic Aortic Disease. Circulation. 2010; 121: J287-O676  Aortic Atherosclerosis (ICD10-I70.0).  Aortic aneurysm NOS (ICD10-I71.9).  Critical Value/emergent results were called by telephone at the time of interpretation on 02/17/2017 at 2:29 pm to Dr. Quintella Reichert , who verbally acknowledged these results.   Electronically Signed   By: Lavonia Dana M.D.   On: 02/17/2017 14:29   Echo Study Conclusions from 01/2017  - Left ventricle: The cavity size was severely dilated. Systolic   function was severely reduced. The estimated ejection fraction   was in the range of 20% to 25%. Severe diffuse hypokinesis with   regional variations. The study was not technically sufficient to   allow evaluation of LV diastolic dysfunction due to atrial   fibrillation. - Aortic valve: Moderately to severely calcified annulus.   Trileaflet; mildly thickened, moderately calcified leaflets.   Valve area (VTI): 1.92 cm^2. Valve area (Vmax): 1.77 cm^2. Valve   area (Vmean): 2.15 cm^2. - Aorta: Aortic root dimension: 50 mm (ED). - Aortic root: The aortic root was severely dilated. - Left atrium: The atrium was moderately to severely dilated. - Right ventricle: The cavity size was mildly dilated. Wall   thickness was normal. - Pulmonary arteries: PA peak pressure: 34 mm Hg (S).   Assessment / Plan:  1. 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 Has had XRT - awaiting scans next week and then has follow up with oncology -  he has been unsure if he would take chemo.  He does not look good today. I think his overall prognosis is quite poor.   2. Recurrent AF - hard to control his rate - now on Eliquis - he is back in NSR today. Rate is ok for now.   3. PE -  transitioned to Eliquis - discussed with pharmacy here today - will plan to follow the guidelines for 5 mg BID for 3 months and then will move to the AF dosing and cut the dose back.  Unfortunately, there is really no great option for his anticoagulation given his cancer. He had grossly elevated INRs on Warfarin. Limited data on Eliquis.   4. Bradycardia - not an issue at this time but still worrisome.   4. Systolic heart failure - EF back down to 20% - off CCB therapy - overall prognosis looks very poor to me. Short of breath with just talking here - refuses admission today. Will increase his Lasix - very poor prognosis going forward.   5. Enlarged aorta - previously followed by Dr. [name] - he has previously decided that he would not have any further evaluation and there are no plans for surgical intervention.    Current medicines are reviewed with the patient today.  The patient does not have concerns regarding medicines other than what has been noted above.  The following changes have been made:  See above.  Labs/ tests ordered today include:    Orders Placed This Encounter  Procedures  . EKG 12-Lead     Disposition:   FU with me in 2 weeks.   Patient is agreeable to this plan and will call if any problems develop in the interim.   SignedTruitt Merle, NP  03/03/2017 11:36 AM  [facility] [address] [place], [place]  [address] Phone: [phone] Fax: [phone]

## 2017-03-03 NOTE — Patient Instructions (Addendum)
We will be checking the following labs today - NONE   Medication Instructions:    Continue with your current medicines. BUT  Take 3 of your Lasix tablets today and tomorrow - and then just 2 a day every day  I have done your refills.     Testing/Procedures To Be Arranged:  N/A  Follow-Up:   See me back in about 2 weeks    Other Special Instructions:   Keep your follow up with oncology.     If you need a refill on your cardiac medications before your next appointment, please call your pharmacy.   Call the [place] office at [phone] if you have any questions, problems or concerns.

## 2017-03-05 ENCOUNTER — Other Ambulatory Visit (HOSPITAL_BASED_OUTPATIENT_CLINIC_OR_DEPARTMENT_OTHER): Payer: PPO

## 2017-03-05 DIAGNOSIS — C880 Waldenstrom macroglobulinemia: Secondary | ICD-10-CM | POA: Diagnosis not present

## 2017-03-05 DIAGNOSIS — C221 Intrahepatic bile duct carcinoma: Secondary | ICD-10-CM

## 2017-03-05 LAB — CBC & DIFF AND RETIC
BASO%: 0.2 % (ref 0.0–2.0)
Basophils Absolute: 0 10*3/uL (ref 0.0–0.1)
EOS ABS: 0.1 10*3/uL (ref 0.0–0.5)
EOS%: 2.6 % (ref 0.0–7.0)
HCT: 38.8 % (ref 38.4–49.9)
HEMOGLOBIN: 12.3 g/dL — AB (ref 13.0–17.1)
IMMATURE RETIC FRACT: 7.1 % (ref 3.00–10.60)
LYMPH#: 0.6 10*3/uL — AB (ref 0.9–3.3)
LYMPH%: 12.4 % — AB (ref 14.0–49.0)
MCH: 31.2 pg (ref 27.2–33.4)
MCHC: 31.7 g/dL — ABNORMAL LOW (ref 32.0–36.0)
MCV: 98.5 fL — ABNORMAL HIGH (ref 79.3–98.0)
MONO#: 0.6 10*3/uL (ref 0.1–0.9)
MONO%: 12.6 % (ref 0.0–14.0)
NEUT%: 72.2 % (ref 39.0–75.0)
NEUTROS ABS: 3.6 10*3/uL (ref 1.5–6.5)
Platelets: 94 10*3/uL — ABNORMAL LOW (ref 140–400)
RBC: 3.94 10*6/uL — AB (ref 4.20–5.82)
RDW: 14.3 % (ref 11.0–14.6)
RETIC %: 2.32 % — AB (ref 0.80–1.80)
RETIC CT ABS: 91.41 10*3/uL (ref 34.80–93.90)
WBC: 5 10*3/uL (ref 4.0–10.3)

## 2017-03-05 LAB — COMPREHENSIVE METABOLIC PANEL
ALBUMIN: 3.4 g/dL — AB (ref 3.5–5.0)
ALK PHOS: 186 U/L — AB (ref 40–150)
ALT: 45 U/L (ref 0–55)
AST: 32 U/L (ref 5–34)
Anion Gap: 9 mEq/L (ref 3–11)
BILIRUBIN TOTAL: 0.92 mg/dL (ref 0.20–1.20)
BUN: 32.6 mg/dL — AB (ref 7.0–26.0)
CO2: 29 meq/L (ref 22–29)
Calcium: 9.1 mg/dL (ref 8.4–10.4)
Chloride: 106 mEq/L (ref 98–109)
Creatinine: 1.3 mg/dL (ref 0.7–1.3)
EGFR: 49 mL/min/{1.73_m2} — AB (ref >90)
GLUCOSE: 109 mg/dL (ref 70–140)
Potassium: 4 mEq/L (ref 3.5–5.1)
SODIUM: 144 meq/L (ref 136–145)
TOTAL PROTEIN: 6.1 g/dL — AB (ref 6.4–8.3)

## 2017-03-05 LAB — CEA (IN HOUSE-CHCC): CEA (CHCC-In House): 5.79 ng/mL — ABNORMAL HIGH (ref 0.00–5.00)

## 2017-03-06 LAB — KAPPA/LAMBDA LIGHT CHAINS
IG KAPPA FREE LIGHT CHAIN: 14.2 mg/L (ref 3.3–19.4)
IG LAMBDA FREE LIGHT CHAIN: 23.2 mg/L (ref 5.7–26.3)
Kappa/Lambda FluidC Ratio: 0.61 (ref 0.26–1.65)

## 2017-03-06 LAB — CANCER ANTIGEN 19-9: CAN 19-9: 252 U/mL — AB (ref 0–35)

## 2017-03-09 ENCOUNTER — Ambulatory Visit: Payer: PPO | Admitting: Nurse Practitioner

## 2017-03-10 ENCOUNTER — Ambulatory Visit (HOSPITAL_COMMUNITY)
Admission: RE | Admit: 2017-03-10 | Discharge: 2017-03-10 | Disposition: A | Discharge status: Home / Self Care | Payer: PPO | Source: Ambulatory Visit | Attending: Hematology | Admitting: Hematology

## 2017-03-10 ENCOUNTER — Encounter (HOSPITAL_COMMUNITY): Payer: Self-pay

## 2017-03-10 DIAGNOSIS — C221 Intrahepatic bile duct carcinoma: Secondary | ICD-10-CM | POA: Insufficient documentation

## 2017-03-10 DIAGNOSIS — J90 Pleural effusion, not elsewhere classified: Secondary | ICD-10-CM | POA: Insufficient documentation

## 2017-03-10 DIAGNOSIS — N281 Cyst of kidney, acquired: Secondary | ICD-10-CM | POA: Diagnosis not present

## 2017-03-10 DIAGNOSIS — M5136 Other intervertebral disc degeneration, lumbar region: Secondary | ICD-10-CM | POA: Insufficient documentation

## 2017-03-10 DIAGNOSIS — M47896 Other spondylosis, lumbar region: Secondary | ICD-10-CM | POA: Diagnosis not present

## 2017-03-10 DIAGNOSIS — E041 Nontoxic single thyroid nodule: Secondary | ICD-10-CM | POA: Insufficient documentation

## 2017-03-10 DIAGNOSIS — I700 Atherosclerosis of aorta: Secondary | ICD-10-CM | POA: Diagnosis not present

## 2017-03-10 DIAGNOSIS — I2510 Atherosclerotic heart disease of native coronary artery without angina pectoris: Secondary | ICD-10-CM | POA: Diagnosis not present

## 2017-03-10 DIAGNOSIS — I517 Cardiomegaly: Secondary | ICD-10-CM | POA: Insufficient documentation

## 2017-03-10 MED ORDER — IOPAMIDOL (ISOVUE-300) INJECTION 61%
INTRAVENOUS | Status: AC
  Filled 2017-03-10: qty 100

## 2017-03-10 MED ORDER — IOPAMIDOL (ISOVUE-300) INJECTION 61%
100.0000 mL | Freq: Once | INTRAVENOUS | Status: AC | PRN
  Administered 2017-03-10: 100 mL via INTRAVENOUS

## 2017-03-10 NOTE — Progress Notes (Signed)
Radiation Oncology         [phone] ________________________________  Name: Jimmy Myers MRN: [id number]  Date: 03/11/2017  DOB: October 01, 1931  Post Treatment Note  CC: [name], MD  Brunetta Genera, MD  Diagnosis:   Cholangiocarcinoma  Interval Since Last Radiation:  7 weeks   01/05/17-01/15/17 SBRT:   33 Gy in 5 fractions to the target in the common bile duct  Narrative:  The patient returns today for routine follow-up. The patient tolerated radiotherapy well. Unfortunately he was recent hospitalized with Atrial Fibrillation and RVR. He has overall a poor prognosis with his cardiac disease and recently was found to have PE. He remains on eliquis. He underwent repeat imaging of the chest/abdomen/pelvis yesterday which revealed concerns for soft tissue thickening about the common bile duct possibly inflammatory changes, and several foci of enhancement in the liver that were nonspecific.                                On review of systems, the patient states he is feeling ok, but still pretty fatigued. He is discouraged that he hasn't "bounced" back from his last hospitalization as quickly as he would have expected. He continues to have bilateral lower extremity edema and reports this is about status quo currently. He has plans to go back to cardiology next week to review his medications and status. He continues his eliquis and reports that since starting the medication he feels like his breathing is somewhat improved as well. He denies any abdominal pain, nausea, vomiting, or bowel dysfunction. No fevers or chills have been noted. No other complaints are verbalized.  ALLERGIES:  is allergic to codeine; simvastatin; and vicodin [hydrocodone-acetaminophen].  Meds: Current Outpatient Prescriptions  Medication Sig Dispense Refill  . acetaminophen (TYLENOL) 325 MG tablet Take 325 mg by mouth every 6 (six) hours as needed.    Marland Kitchen albuterol (PROVENTIL HFA;VENTOLIN HFA) 108 (90 BASE)  MCG/ACT inhaler Inhale 2 puffs into the lungs every 6 (six) hours as needed for wheezing or shortness of breath.    Marland Kitchen apixaban (ELIQUIS) 5 MG TABS tablet Take 1 tablet (5 mg total) by mouth 2 (two) times daily. 60 tablet 6  . clorazepate (TRANXENE) 3.75 MG tablet Give .05 tablet by mouth daily as needed for anxiety    . fluticasone (FLONASE) 50 MCG/ACT nasal spray Place 1 spray into both nostrils daily as needed for allergies or rhinitis.    . folic acid (FOLVITE) 606 MCG tablet Take 800 mcg by mouth daily.    . furosemide (LASIX) 40 MG tablet Take 3 tablets today and tomorrow - then 2 tablets by by mouth daily 60 tablet 2  . metoprolol succinate (TOPROL-XL) 25 MG 24 hr tablet Take 3 tablets (75 mg total) by mouth daily. 90 tablet 6  . omeprazole (PRILOSEC) 20 MG capsule Take 20 mg by mouth daily before breakfast.     . polyethylene glycol (MIRALAX / GLYCOLAX) packet Take 17 g by mouth at bedtime as needed.     . potassium chloride (K-DUR) 10 MEQ tablet Take 1 tablet (10 mEq total) by mouth daily. 30 tablet 3  . nitroGLYCERIN (NITROSTAT) 0.4 MG SL tablet Place 1 tablet (0.4 mg total) under the tongue every 5 (five) minutes x 3 doses as needed for chest pain. (Patient not taking: Reported on 03/11/2017) 25 tablet 6   No current facility-administered medications for this encounter.  Physical Findings:  height is 5\' 10"  (1.778 m) and weight is 210 lb 9.6 oz (95.5 kg). His oral temperature is 98 F (36.7 C). His blood pressure is 110/59 (abnormal) and his pulse is 60. His respiration is 18 and oxygen saturation is 99%.  Pain Assessment Pain Score: 4  (Legs)/10 In general this is a chronically ill appearing caucasian male in no acute distress. He's alert and oriented x4 and appropriate throughout the examination. Cardiopulmonary assessment is negative for acute distress and he exhibits normal effort. He has 2+ pitting edema of his lower extremities bilaterally and no deep calf tenderness, or  cyanosis is noted.  Lab Findings: Lab Results  Component Value Date   WBC 5.0 03/05/2017   HGB 12.3 (L) 03/05/2017   HCT 38.8 03/05/2017   MCV 98.5 (H) 03/05/2017   PLT 94 (L) 03/05/2017     Radiographic Findings: Dg Chest 2 View  Result Date: 02/23/2017 CLINICAL DATA:  83 year old male with shortness of Breath and weakness. EXAM: CHEST  2 VIEW COMPARISON:  02/21/2017 and earlier. FINDINGS: Small bilateral pleural effusions persist and may be mildly increased since 02/19/2017. Decreased pulmonary vascularity since that time. No overt edema. No pneumothorax. Lung base atelectasis. No consolidation. Stable cardiomegaly and mediastinal contours. Calcified aortic atherosclerosis. Visualized tracheal air column is within normal limits. No acute osseous abnormality identified. Negative visible bowel gas pattern. IMPRESSION: 1. Resolved pulmonary edema but continued and perhaps slightly increased bilateral pleural effusions since 02/19/2017. 2. Cardiomegaly.  Calcified aortic atherosclerosis. Electronically Signed   By: Genevie Ann M.D.   On: 02/23/2017 08:27   Dg Chest 2 View  Result Date: 02/19/2017 CLINICAL DATA:  Shortness of breath. Acute pulmonary embolism. History of atrial fibrillation, chronic CHF, cholangiocarcinoma. EXAM: CHEST  2 VIEW COMPARISON:  Chest x-ray and chest CT scan of February 17, 2017 FINDINGS: The lungs are well-expanded. The interstitial markings are more conspicuous today. The pulmonary vascularity remains engorged. The cardiac silhouette remains enlarged. There small bilateral pleural effusions which are stable. There is calcification in the wall of the thoracic aorta. There is multilevel degenerative disc disease of the thoracic spine. IMPRESSION: CHF with mild interstitial edema and small bilateral pleural effusions. No acute pneumonia. Thoracic aortic atherosclerosis. Electronically Signed   By: David  Martinique M.D.   On: 02/19/2017 08:58   Dg Chest 2 View  Result Date:  02/17/2017 CLINICAL DATA:  3-4 days of shortness of breath. No chest pain. History of CHF, atrial fibrillation. EXAM: CHEST  2 VIEW COMPARISON:  Chest CT scan of October 31, 2016 and chest x-ray of Jan 06, 2012. FINDINGS: The lungs remain mildly hyperinflated. There are small bilateral pleural effusions layering posteriorly which are slightly more conspicuous than on the previous study. The cardiac silhouette is enlarged. The pulmonary vascularity is engorged. There is calcification in the wall of the thoracic aorta. The bony thorax exhibits no acute abnormality. IMPRESSION: Hyperinflation consistent with chronic bronchitis or reactive airway disease, stable. Cardiomegaly, pulmonary vascular congestion, and mild pulmonary interstitial edema consistent with CHF. Small bilateral pleural effusions more conspicuous than on the previous study. Thoracic aortic atherosclerosis. Electronically Signed   By: David  Martinique M.D.   On: 02/17/2017 13:14   Ct Chest W Contrast  Result Date: 03/10/2017 CLINICAL DATA:  Cholangiocarcinoma followup.  Lymphoma. EXAM: CT CHEST, ABDOMEN, AND PELVIS WITH CONTRAST TECHNIQUE: Multidetector CT imaging of the chest, abdomen and pelvis was performed following the standard protocol during bolus administration of intravenous contrast. CONTRAST:  142mL ISOVUE-300 IOPAMIDOL (  ISOVUE-300) INJECTION 61% COMPARISON:  Multiple exams, including 02/17/2017 and 12/03/2016 FINDINGS: CT CHEST FINDINGS Cardiovascular: Coronary, aortic arch, and branch vessel atherosclerotic vascular disease. Moderate cardiomegaly. Previous pulmonary thrombus no longer visible although today' s exam was not performed as a pulmonary embolus CT angiogram. Mediastinum/Nodes: Partially solid left thyroid nodule 5.2 by 3.6 cm on image 15/2, similar to prior. Left lower paratracheal node 0.8 cm in short axis on image 31/2, stable. No pathologic adenopathy in the chest. Lungs/Pleura: Moderate bilateral pleural effusions with  associated passive atelectasis. No definite mass like appearance or abnormal enhancement along the pleural surfaces. Airway thickening is present, suggesting bronchitis or reactive airways disease. A triangular nodule in the left lower lobe measures 1.0 by 0.5 cm on image 146/6, formerly 1.2 by 0.7 cm. Musculoskeletal: Thoracic spondylosis. CT ABDOMEN PELVIS FINDINGS Hepatobiliary: There several foci of indistinct arterial phase enhancement in segment 7 of the liver including a 1.6 by 0.9 cm lesion on image 59/2; a 0.9 by 0.7 cm lesion also on image 59; and a 10 mm lesion on image 56/2. There is some other faint areas of arterial phase heterogeneity in the liver. Pneumobilia. There is a expandable stent in the common bile duct with surrounding wall thickening and indistinct margins concerning for tumor in this context. The surrounding soft tissue density measures up to 3.1 by 2.1 cm on image 73/2, although some of this appearance could be inflammatory. No current biliary dilatation. There is some minimal gallbladder wall thickening along with several suspected small gallstones. Pancreas: No dorsal pancreatic duct dilatation. Pancreas unremarkable. Spleen: Unremarkable Adrenals/Urinary Tract: Right kidney upper pole exophytic fluid density cyst. Left mid kidney 1.4 cm hypodense lesion is stable and likely a benign cyst, although slightly blurred and difficult to make reliable density measurements within. Adrenal glands unremarkable. Stomach/Bowel: Unremarkable Vascular/Lymphatic: Aortoiliac atherosclerotic vascular disease. No pathologic adenopathy is identified. Reproductive: Low-density along the upper prostate, query prior transurethral resection of the prostate. Otherwise unremarkable. Other: Trace perihepatic ascites extending along the right paracolic gutter. Mild presacral edema, cause and significance uncertain. Musculoskeletal: Lumbar spondylosis and degenerative disc disease causing impingement most notably  at L4-5 but also at L3-4 and L5-S1. Transitional S1 vertebra. Bridging spurring of both sacroiliac joints. Degenerative arthropathy of both hips. IMPRESSION: 1. An expandable stent spans the region of the mass along the common bile duct. There is soft tissue density in this vicinity surrounding the stent, total size 3.1 by 2.1 cm, although some of this could conceivably be inflammatory. Portal vein remains patent. 2. Several foci of arterial phase enhancement in segment 7 of the liver are nonspecific. These could be due to transient hepatic attenuation difference but I cannot exclude metastatic disease. Surveillance likely warranted. 3. Trace perihepatic ascites tracking down in the right paracolic gutter. 4. Slight reduction in size of a triangular left lower lobe pulmonary nodule, currently 1.0 by 0.5 cm, merits surveillance. 5. Other imaging findings of potential clinical significance: Aortic Atherosclerosis (ICD10-I70.0). Coronary atherosclerosis. Moderate cardiomegaly. Stable large partially solid left thyroid nodule. Moderate bilateral pleural effusions with passive atelectasis. Airway thickening is present, suggesting bronchitis or reactive airways disease. 6. Bilateral renal cysts. 7. Lower lumbar spondylosis and degenerative disc disease causing multilevel impingement. Electronically Signed   By: Van Clines M.D.   On: 03/10/2017 12:08   Ct Angio Chest Pe W/cm &/or Wo Cm  Result Date: 02/17/2017 CLINICAL DATA:  Shortness of breath for 4 days, atrial fibrillation, no chest pain; history systolic CHF, hypertension EXAM: CT ANGIOGRAPHY CHEST  WITH CONTRAST TECHNIQUE: Multidetector CT imaging of the chest was performed using the standard protocol during bolus administration of intravenous contrast. Multiplanar CT image reconstructions and MIPs were obtained to evaluate the vascular anatomy. CONTRAST:  80 cc Isovue 370 IV COMPARISON:  None. FINDINGS: Cardiovascular: Atherosclerotic calcifications aorta  and coronary arteries. Aneurysmal dilatation ascending thoracic aorta 4.8 cm transverse image 78 unchanged. No pericardial effusion. Dilatation of LEFT ventricle. Pulmonary arteries well opacified. Few scattered respiratory motion artifacts. Small filling defect identified within a posterior RIGHT lower lobe pulmonary artery compatible pulmonary embolism. No additional pulmonary emboli identified. No RIGHT ventricular dilatation. Mediastinum/Nodes: Large LEFT thyroid mass again identified 5.3 x 4.2 cm, measured 5.7 x 3.6 cm on 09/17/2015. No thoracic adenopathy. Esophagus unremarkable. Lungs/Pleura: Small BILATERAL pleural effusions with compressive atelectasis of the lower lobes. Central peribronchial thickening. No definite infiltrate or pneumothorax. Upper Abdomen: Pneumobilia unchanged. Remaining visualized upper abdomen unremarkable. Musculoskeletal: Diffuse osseous demineralization. Scattered degenerative disc disease changes thoracic spine. Review of the MIP images confirms the above findings. IMPRESSION: Small pulmonary embolus in a RIGHT lower lobe pulmonary artery with small BILATERAL pleural effusions with compressive atelectasis of the posterior lower lobes. Stable LEFT thyroid mass. Coronary arterial calcification. Aneurysmal dilatation ascending thoracic aorta 4.8 cm transverse unchanged, recommendation below. Ascending thoracic aortic aneurysm. Recommend semi-annual imaging followup by CTA or MRA and referral to cardiothoracic surgery if not already obtained. This recommendation follows 2010 ACCF/AHA/AATS/ACR/ASA/SCA/SCAI/SIR/STS/SVM Guidelines for the Diagnosis and Management of Patients With Thoracic Aortic Disease. Circulation. 2010; 121: X528-U132 Aortic Atherosclerosis (ICD10-I70.0). Aortic aneurysm NOS (ICD10-I71.9). Critical Value/emergent results were called by telephone at the time of interpretation on 02/17/2017 at 2:29 pm to Dr. Quintella Reichert , who verbally acknowledged these results.  Electronically Signed   By: Lavonia Dana M.D.   On: 02/17/2017 14:29   Ct Abdomen Pelvis W Contrast  Result Date: 03/10/2017 CLINICAL DATA:  Cholangiocarcinoma followup.  Lymphoma. EXAM: CT CHEST, ABDOMEN, AND PELVIS WITH CONTRAST TECHNIQUE: Multidetector CT imaging of the chest, abdomen and pelvis was performed following the standard protocol during bolus administration of intravenous contrast. CONTRAST:  143mL ISOVUE-300 IOPAMIDOL (ISOVUE-300) INJECTION 61% COMPARISON:  Multiple exams, including 02/17/2017 and 12/03/2016 FINDINGS: CT CHEST FINDINGS Cardiovascular: Coronary, aortic arch, and branch vessel atherosclerotic vascular disease. Moderate cardiomegaly. Previous pulmonary thrombus no longer visible although today' s exam was not performed as a pulmonary embolus CT angiogram. Mediastinum/Nodes: Partially solid left thyroid nodule 5.2 by 3.6 cm on image 15/2, similar to prior. Left lower paratracheal node 0.8 cm in short axis on image 31/2, stable. No pathologic adenopathy in the chest. Lungs/Pleura: Moderate bilateral pleural effusions with associated passive atelectasis. No definite mass like appearance or abnormal enhancement along the pleural surfaces. Airway thickening is present, suggesting bronchitis or reactive airways disease. A triangular nodule in the left lower lobe measures 1.0 by 0.5 cm on image 146/6, formerly 1.2 by 0.7 cm. Musculoskeletal: Thoracic spondylosis. CT ABDOMEN PELVIS FINDINGS Hepatobiliary: There several foci of indistinct arterial phase enhancement in segment 7 of the liver including a 1.6 by 0.9 cm lesion on image 59/2; a 0.9 by 0.7 cm lesion also on image 59; and a 10 mm lesion on image 56/2. There is some other faint areas of arterial phase heterogeneity in the liver. Pneumobilia. There is a expandable stent in the common bile duct with surrounding wall thickening and indistinct margins concerning for tumor in this context. The surrounding soft tissue density measures up to  3.1 by 2.1 cm on image 73/2, although some  of this appearance could be inflammatory. No current biliary dilatation. There is some minimal gallbladder wall thickening along with several suspected small gallstones. Pancreas: No dorsal pancreatic duct dilatation. Pancreas unremarkable. Spleen: Unremarkable Adrenals/Urinary Tract: Right kidney upper pole exophytic fluid density cyst. Left mid kidney 1.4 cm hypodense lesion is stable and likely a benign cyst, although slightly blurred and difficult to make reliable density measurements within. Adrenal glands unremarkable. Stomach/Bowel: Unremarkable Vascular/Lymphatic: Aortoiliac atherosclerotic vascular disease. No pathologic adenopathy is identified. Reproductive: Low-density along the upper prostate, query prior transurethral resection of the prostate. Otherwise unremarkable. Other: Trace perihepatic ascites extending along the right paracolic gutter. Mild presacral edema, cause and significance uncertain. Musculoskeletal: Lumbar spondylosis and degenerative disc disease causing impingement most notably at L4-5 but also at L3-4 and L5-S1. Transitional S1 vertebra. Bridging spurring of both sacroiliac joints. Degenerative arthropathy of both hips. IMPRESSION: 1. An expandable stent spans the region of the mass along the common bile duct. There is soft tissue density in this vicinity surrounding the stent, total size 3.1 by 2.1 cm, although some of this could conceivably be inflammatory. Portal vein remains patent. 2. Several foci of arterial phase enhancement in segment 7 of the liver are nonspecific. These could be due to transient hepatic attenuation difference but I cannot exclude metastatic disease. Surveillance likely warranted. 3. Trace perihepatic ascites tracking down in the right paracolic gutter. 4. Slight reduction in size of a triangular left lower lobe pulmonary nodule, currently 1.0 by 0.5 cm, merits surveillance. 5. Other imaging findings of potential  clinical significance: Aortic Atherosclerosis (ICD10-I70.0). Coronary atherosclerosis. Moderate cardiomegaly. Stable large partially solid left thyroid nodule. Moderate bilateral pleural effusions with passive atelectasis. Airway thickening is present, suggesting bronchitis or reactive airways disease. 6. Bilateral renal cysts. 7. Lower lumbar spondylosis and degenerative disc disease causing multilevel impingement. Electronically Signed   By: Van Clines M.D.   On: 03/10/2017 12:08   Dg Chest Port 1 View  Result Date: 02/21/2017 CLINICAL DATA:  Congestive heart failure. Acute respiratory failure. EXAM: PORTABLE CHEST 1 VIEW COMPARISON:  02/19/2017 and 02/17/2017 FINDINGS: There is cardiomegaly with tortuosity and calcification of the thoracic aorta. Small right pleural effusion appears slightly more prominent. Increased density at the lung bases posterior medially probably represents pleural effusions. The pulmonary vascularity is now normal. IMPRESSION: Bilateral pleural effusions, small but slightly increased on the right. Resolved pulmonary vascular congestion. Aortic atherosclerosis. Cardiomegaly. Electronically Signed   By: Lorriane Shire M.D.   On: 02/21/2017 10:33    Impression/Plan: 1. Cholangiocarcinoma. Dr. Lisbeth Renshaw and I reviewed the patient's films from his recent CT scan. It appears that the total area measured around the CBD includes the diameter of the patient's indwelling stent. We see there are changes consistent with post radiotherapy inflammation about the stent, and given this, Dr. Lisbeth Renshaw recommends repeating a CT of the abdomen in about 6 weeks for comparison. The patient is in agreement and we will notify Dr. Irene Limbo as well. The patient will meet back with Dr. Irene Limbo next week as well, and we did review the findings in the liver and will also take note of this on his next CT scan. 2. Afib, CHF, PE. The patient will follow up with cardiology next week and continue his current regimen.  He will call their office if he develops any acute onset of symptoms.     Carola Rhine, PAC

## 2017-03-11 ENCOUNTER — Encounter: Payer: Self-pay | Admitting: Radiation Oncology

## 2017-03-11 ENCOUNTER — Ambulatory Visit: Payer: PPO | Admitting: Nurse Practitioner

## 2017-03-11 ENCOUNTER — Ambulatory Visit
Admission: RE | Admit: 2017-03-11 | Discharge: 2017-03-11 | Disposition: A | Discharge status: Home / Self Care | Payer: PPO | Source: Ambulatory Visit | Attending: Radiation Oncology | Admitting: Radiation Oncology

## 2017-03-11 VITALS — BP 110/59 | HR 60 | Temp 98.0°F | Resp 18 | Ht 70.0 in | Wt 210.6 lb

## 2017-03-11 DIAGNOSIS — Z9889 Other specified postprocedural states: Secondary | ICD-10-CM | POA: Insufficient documentation

## 2017-03-11 DIAGNOSIS — C221 Intrahepatic bile duct carcinoma: Secondary | ICD-10-CM | POA: Insufficient documentation

## 2017-03-11 DIAGNOSIS — Z8249 Family history of ischemic heart disease and other diseases of the circulatory system: Secondary | ICD-10-CM | POA: Diagnosis not present

## 2017-03-11 DIAGNOSIS — Z510 Encounter for antineoplastic radiation therapy: Secondary | ICD-10-CM | POA: Diagnosis not present

## 2017-03-11 DIAGNOSIS — K831 Obstruction of bile duct: Secondary | ICD-10-CM | POA: Diagnosis not present

## 2017-03-11 DIAGNOSIS — Z888 Allergy status to other drugs, medicaments and biological substances status: Secondary | ICD-10-CM | POA: Diagnosis not present

## 2017-03-11 DIAGNOSIS — Z7901 Long term (current) use of anticoagulants: Secondary | ICD-10-CM | POA: Diagnosis not present

## 2017-03-11 DIAGNOSIS — R079 Chest pain, unspecified: Secondary | ICD-10-CM | POA: Diagnosis not present

## 2017-03-11 DIAGNOSIS — Z800 Family history of malignant neoplasm of digestive organs: Secondary | ICD-10-CM | POA: Diagnosis not present

## 2017-03-11 DIAGNOSIS — K219 Gastro-esophageal reflux disease without esophagitis: Secondary | ICD-10-CM | POA: Insufficient documentation

## 2017-03-11 DIAGNOSIS — Z79899 Other long term (current) drug therapy: Secondary | ICD-10-CM | POA: Insufficient documentation

## 2017-03-11 DIAGNOSIS — F419 Anxiety disorder, unspecified: Secondary | ICD-10-CM | POA: Diagnosis not present

## 2017-03-11 DIAGNOSIS — I4891 Unspecified atrial fibrillation: Secondary | ICD-10-CM | POA: Diagnosis not present

## 2017-03-11 DIAGNOSIS — I509 Heart failure, unspecified: Secondary | ICD-10-CM | POA: Diagnosis not present

## 2017-03-11 DIAGNOSIS — I110 Hypertensive heart disease with heart failure: Secondary | ICD-10-CM | POA: Insufficient documentation

## 2017-03-12 ENCOUNTER — Other Ambulatory Visit: Payer: Self-pay | Admitting: Radiation Oncology

## 2017-03-12 DIAGNOSIS — C221 Intrahepatic bile duct carcinoma: Secondary | ICD-10-CM

## 2017-03-12 LAB — MULTIPLE MYELOMA PANEL, SERUM
ALBUMIN/GLOB SERPL: 1.4 (ref 0.7–1.7)
ALPHA 1: 0.3 g/dL (ref 0.0–0.4)
ALPHA2 GLOB SERPL ELPH-MCNC: 0.6 g/dL (ref 0.4–1.0)
Albumin SerPl Elph-Mcnc: 3.2 g/dL (ref 2.9–4.4)
B-GLOBULIN SERPL ELPH-MCNC: 0.6 g/dL — AB (ref 0.7–1.3)
GLOBULIN, TOTAL: 2.4 g/dL (ref 2.2–3.9)
Gamma Glob SerPl Elph-Mcnc: 0.9 g/dL (ref 0.4–1.8)
IGG (IMMUNOGLOBIN G), SERUM: 561 mg/dL — AB (ref 700–1600)
IgA, Qn, Serum: 51 mg/dL — ABNORMAL LOW (ref 61–437)
IgM, Qn, Serum: 511 mg/dL — ABNORMAL HIGH (ref 15–143)
M PROTEIN SERPL ELPH-MCNC: 0.4 g/dL — AB
Total Protein: 5.6 g/dL — ABNORMAL LOW (ref 6.0–8.5)

## 2017-03-16 ENCOUNTER — Telehealth: Payer: Self-pay | Admitting: Nurse Practitioner

## 2017-03-16 NOTE — Telephone Encounter (Signed)
New message  Pt call requesting to speak with RN about his Heart Rate ranging 113-128. Pt states it has been running high and he would like to know if he needs to come off medication. Please call back to discuss

## 2017-03-16 NOTE — Telephone Encounter (Signed)
Most likely he is back in atrial fibrillation. He needs to stay on all of his medicines and not come off anything. Will see Wednesday as planned. Try to check some BP's if possible.

## 2017-03-16 NOTE — Telephone Encounter (Signed)
Pt is aware of Lori's recommendation's. Will bring a list of bp readings to ov on Wednesday, July 16.

## 2017-03-17 ENCOUNTER — Encounter: Payer: Self-pay | Admitting: Hematology

## 2017-03-17 ENCOUNTER — Telehealth: Payer: Self-pay | Admitting: Hematology

## 2017-03-17 ENCOUNTER — Ambulatory Visit (HOSPITAL_BASED_OUTPATIENT_CLINIC_OR_DEPARTMENT_OTHER): Payer: PPO | Admitting: Hematology

## 2017-03-17 VITALS — BP 110/70 | HR 93 | Temp 98.0°F | Resp 20 | Ht 70.0 in | Wt 211.8 lb

## 2017-03-17 DIAGNOSIS — R7989 Other specified abnormal findings of blood chemistry: Secondary | ICD-10-CM

## 2017-03-17 DIAGNOSIS — C221 Intrahepatic bile duct carcinoma: Secondary | ICD-10-CM | POA: Diagnosis not present

## 2017-03-17 DIAGNOSIS — C880 Waldenstrom macroglobulinemia: Secondary | ICD-10-CM | POA: Diagnosis not present

## 2017-03-17 NOTE — Patient Instructions (Signed)
Thank you for choosing [facility] to provide your oncology and hematology care.  To afford each patient quality time with our providers, please arrive 30 minutes before your scheduled appointment time.  If you arrive late for your appointment, you may be asked to reschedule.  We strive to give you quality time with our providers, and arriving late affects you and other patients whose appointments are after yours.   If you are a no show for multiple scheduled visits, you may be dismissed from the clinic at the providers discretion.    Again, thank you for choosing [facility], our hope is that these requests will decrease the amount of time that you wait before being seen by our physicians.  ______________________________________________________________________  Should you have questions after your visit to the  Cancer Center, please contact our office at [phone] between the hours of 8:30 and 4:30 p.m.    Voicemails left after 4:30p.m will not be returned until the following business day.    For prescription refill requests, please have your pharmacy contact us directly.  Please also try to allow 48 hours for prescription requests.    Please contact the scheduling department for questions regarding scheduling.  For scheduling of procedures such as PET scans, CT scans, MRI, Ultrasound, etc please contact central scheduling at [phone].    Resources For Cancer Patients and Caregivers:   Oncolink.org:  A wonderful resource for patients and healthcare providers for information regarding your disease, ways to tract your treatment, what to expect, etc.     American Cancer Society:  800-227-2345  Can help patients locate various types of support and financial assistance  Cancer Care: 1-800-813-HOPE (4673) Provides financial assistance, online support groups, medication/co-pay assistance.    [place] DSS:  336-641-3447 Where to apply for food  stamps, Medicaid, and utility assistance  Medicare Rights Center: 800-333-4114 Helps people with Medicare understand their rights and benefits, navigate the Medicare system, and secure the quality healthcare they deserve  SCAT: 336-333-6589 [place] [employer]'s shared-ride transportation service for eligible riders who have a disability that prevents them from riding the fixed route bus.    For additional information on assistance programs please contact our social worker:   Grier Hock/Abigail Elmore:  [phone]            

## 2017-03-17 NOTE — Telephone Encounter (Signed)
Gave patient avs report and appointments for September. Central radiology will call re scan.  °

## 2017-03-18 ENCOUNTER — Encounter (HOSPITAL_COMMUNITY): Payer: Self-pay | Admitting: General Practice

## 2017-03-18 ENCOUNTER — Inpatient Hospital Stay (HOSPITAL_COMMUNITY)
Admission: AD | Admit: 2017-03-18 | Discharge: 2017-03-24 | DRG: 291 | Disposition: A | Discharge status: Home Health | Payer: PPO | Source: Ambulatory Visit | Attending: Cardiovascular Disease | Admitting: Cardiovascular Disease

## 2017-03-18 ENCOUNTER — Ambulatory Visit (INDEPENDENT_AMBULATORY_CARE_PROVIDER_SITE_OTHER): Payer: PPO | Admitting: Nurse Practitioner

## 2017-03-18 ENCOUNTER — Encounter: Payer: Self-pay | Admitting: Nurse Practitioner

## 2017-03-18 VITALS — BP 110/80 | HR 118 | Ht 70.0 in | Wt 211.8 lb

## 2017-03-18 DIAGNOSIS — E875 Hyperkalemia: Secondary | ICD-10-CM | POA: Diagnosis present

## 2017-03-18 DIAGNOSIS — C240 Malignant neoplasm of extrahepatic bile duct: Secondary | ICD-10-CM | POA: Diagnosis not present

## 2017-03-18 DIAGNOSIS — I482 Chronic atrial fibrillation, unspecified: Secondary | ICD-10-CM

## 2017-03-18 DIAGNOSIS — R0602 Shortness of breath: Secondary | ICD-10-CM

## 2017-03-18 DIAGNOSIS — Z800 Family history of malignant neoplasm of digestive organs: Secondary | ICD-10-CM

## 2017-03-18 DIAGNOSIS — K219 Gastro-esophageal reflux disease without esophagitis: Secondary | ICD-10-CM | POA: Diagnosis present

## 2017-03-18 DIAGNOSIS — K830 Cholangitis: Secondary | ICD-10-CM | POA: Diagnosis present

## 2017-03-18 DIAGNOSIS — I480 Paroxysmal atrial fibrillation: Secondary | ICD-10-CM | POA: Diagnosis not present

## 2017-03-18 DIAGNOSIS — I483 Typical atrial flutter: Secondary | ICD-10-CM | POA: Diagnosis not present

## 2017-03-18 DIAGNOSIS — Z515 Encounter for palliative care: Secondary | ICD-10-CM | POA: Diagnosis not present

## 2017-03-18 DIAGNOSIS — I5021 Acute systolic (congestive) heart failure: Secondary | ICD-10-CM | POA: Insufficient documentation

## 2017-03-18 DIAGNOSIS — C880 Waldenstrom macroglobulinemia: Secondary | ICD-10-CM | POA: Diagnosis present

## 2017-03-18 DIAGNOSIS — C221 Intrahepatic bile duct carcinoma: Secondary | ICD-10-CM | POA: Diagnosis present

## 2017-03-18 DIAGNOSIS — N184 Chronic kidney disease, stage 4 (severe): Secondary | ICD-10-CM | POA: Diagnosis present

## 2017-03-18 DIAGNOSIS — Z961 Presence of intraocular lens: Secondary | ICD-10-CM | POA: Diagnosis present

## 2017-03-18 DIAGNOSIS — I5022 Chronic systolic (congestive) heart failure: Secondary | ICD-10-CM

## 2017-03-18 DIAGNOSIS — D696 Thrombocytopenia, unspecified: Secondary | ICD-10-CM | POA: Diagnosis present

## 2017-03-18 DIAGNOSIS — Z7189 Other specified counseling: Secondary | ICD-10-CM

## 2017-03-18 DIAGNOSIS — Z885 Allergy status to narcotic agent status: Secondary | ICD-10-CM

## 2017-03-18 DIAGNOSIS — N189 Chronic kidney disease, unspecified: Secondary | ICD-10-CM

## 2017-03-18 DIAGNOSIS — Z8249 Family history of ischemic heart disease and other diseases of the circulatory system: Secondary | ICD-10-CM

## 2017-03-18 DIAGNOSIS — I495 Sick sinus syndrome: Secondary | ICD-10-CM | POA: Diagnosis not present

## 2017-03-18 DIAGNOSIS — I4891 Unspecified atrial fibrillation: Secondary | ICD-10-CM | POA: Diagnosis not present

## 2017-03-18 DIAGNOSIS — N170 Acute kidney failure with tubular necrosis: Secondary | ICD-10-CM | POA: Diagnosis not present

## 2017-03-18 DIAGNOSIS — K831 Obstruction of bile duct: Secondary | ICD-10-CM | POA: Diagnosis present

## 2017-03-18 DIAGNOSIS — I5043 Acute on chronic combined systolic (congestive) and diastolic (congestive) heart failure: Secondary | ICD-10-CM | POA: Diagnosis present

## 2017-03-18 DIAGNOSIS — N179 Acute kidney failure, unspecified: Secondary | ICD-10-CM | POA: Diagnosis not present

## 2017-03-18 DIAGNOSIS — Z7901 Long term (current) use of anticoagulants: Secondary | ICD-10-CM

## 2017-03-18 DIAGNOSIS — I2699 Other pulmonary embolism without acute cor pulmonale: Secondary | ICD-10-CM | POA: Diagnosis present

## 2017-03-18 DIAGNOSIS — R188 Other ascites: Secondary | ICD-10-CM | POA: Diagnosis present

## 2017-03-18 DIAGNOSIS — Z86711 Personal history of pulmonary embolism: Secondary | ICD-10-CM

## 2017-03-18 DIAGNOSIS — I4892 Unspecified atrial flutter: Secondary | ICD-10-CM | POA: Diagnosis present

## 2017-03-18 DIAGNOSIS — Z9221 Personal history of antineoplastic chemotherapy: Secondary | ICD-10-CM

## 2017-03-18 DIAGNOSIS — I5040 Unspecified combined systolic (congestive) and diastolic (congestive) heart failure: Secondary | ICD-10-CM | POA: Diagnosis not present

## 2017-03-18 DIAGNOSIS — Z8572 Personal history of non-Hodgkin lymphomas: Secondary | ICD-10-CM

## 2017-03-18 DIAGNOSIS — R001 Bradycardia, unspecified: Secondary | ICD-10-CM | POA: Diagnosis not present

## 2017-03-18 DIAGNOSIS — Z66 Do not resuscitate: Secondary | ICD-10-CM | POA: Diagnosis not present

## 2017-03-18 DIAGNOSIS — K7581 Nonalcoholic steatohepatitis (NASH): Secondary | ICD-10-CM | POA: Diagnosis not present

## 2017-03-18 DIAGNOSIS — Z9842 Cataract extraction status, left eye: Secondary | ICD-10-CM

## 2017-03-18 DIAGNOSIS — N182 Chronic kidney disease, stage 2 (mild): Secondary | ICD-10-CM | POA: Diagnosis not present

## 2017-03-18 DIAGNOSIS — I2510 Atherosclerotic heart disease of native coronary artery without angina pectoris: Secondary | ICD-10-CM | POA: Diagnosis not present

## 2017-03-18 DIAGNOSIS — N185 Chronic kidney disease, stage 5: Secondary | ICD-10-CM | POA: Diagnosis not present

## 2017-03-18 DIAGNOSIS — Z8701 Personal history of pneumonia (recurrent): Secondary | ICD-10-CM

## 2017-03-18 DIAGNOSIS — Z9889 Other specified postprocedural states: Secondary | ICD-10-CM

## 2017-03-18 DIAGNOSIS — I10 Essential (primary) hypertension: Secondary | ICD-10-CM | POA: Diagnosis present

## 2017-03-18 DIAGNOSIS — F419 Anxiety disorder, unspecified: Secondary | ICD-10-CM | POA: Diagnosis present

## 2017-03-18 DIAGNOSIS — I130 Hypertensive heart and chronic kidney disease with heart failure and stage 1 through stage 4 chronic kidney disease, or unspecified chronic kidney disease: Secondary | ICD-10-CM | POA: Diagnosis not present

## 2017-03-18 DIAGNOSIS — L8990 Pressure ulcer of unspecified site, unspecified stage: Secondary | ICD-10-CM | POA: Diagnosis present

## 2017-03-18 DIAGNOSIS — N183 Chronic kidney disease, stage 3 (moderate): Secondary | ICD-10-CM | POA: Diagnosis not present

## 2017-03-18 DIAGNOSIS — I429 Cardiomyopathy, unspecified: Secondary | ICD-10-CM | POA: Diagnosis present

## 2017-03-18 LAB — COMPREHENSIVE METABOLIC PANEL
ALT: 68 U/L — ABNORMAL HIGH (ref 17–63)
AST: 58 U/L — ABNORMAL HIGH (ref 15–41)
Albumin: 3.3 g/dL — ABNORMAL LOW (ref 3.5–5.0)
Alkaline Phosphatase: 214 U/L — ABNORMAL HIGH (ref 38–126)
Anion gap: 8 (ref 5–15)
BUN: 44 mg/dL — ABNORMAL HIGH (ref 6–20)
CO2: 29 mmol/L (ref 22–32)
Calcium: 8.9 mg/dL (ref 8.9–10.3)
Chloride: 103 mmol/L (ref 101–111)
Creatinine, Ser: 1.75 mg/dL — ABNORMAL HIGH (ref 0.61–1.24)
GFR calc Af Amer: 40 mL/min — ABNORMAL LOW (ref >60)
GFR calc non Af Amer: 35 mL/min — ABNORMAL LOW (ref >60)
Glucose, Bld: 147 mg/dL — ABNORMAL HIGH (ref 65–99)
Potassium: 4.3 mmol/L (ref 3.5–5.1)
Sodium: 140 mmol/L (ref 135–145)
Total Bilirubin: 1.5 mg/dL — ABNORMAL HIGH (ref 0.3–1.2)
Total Protein: 5.8 g/dL — ABNORMAL LOW (ref 6.5–8.1)

## 2017-03-18 LAB — CBC WITH DIFFERENTIAL/PLATELET
Basophils Absolute: 0 10*3/uL (ref 0.0–0.1)
Basophils Relative: 0 %
Eosinophils Absolute: 0 10*3/uL (ref 0.0–0.7)
Eosinophils Relative: 1 %
HCT: 37.4 % — ABNORMAL LOW (ref 39.0–52.0)
Hemoglobin: 11.8 g/dL — ABNORMAL LOW (ref 13.0–17.0)
Lymphocytes Relative: 20 %
Lymphs Abs: 1.3 10*3/uL (ref 0.7–4.0)
MCH: 30.6 pg (ref 26.0–34.0)
MCHC: 31.6 g/dL (ref 30.0–36.0)
MCV: 96.9 fL (ref 78.0–100.0)
Monocytes Absolute: 0.8 10*3/uL (ref 0.1–1.0)
Monocytes Relative: 12 %
Neutro Abs: 4.2 10*3/uL (ref 1.7–7.7)
Neutrophils Relative %: 67 %
Platelets: 137 10*3/uL — ABNORMAL LOW (ref 150–400)
RBC: 3.86 MIL/uL — ABNORMAL LOW (ref 4.22–5.81)
RDW: 14.8 % (ref 11.5–15.5)
WBC: 6.3 10*3/uL (ref 4.0–10.5)

## 2017-03-18 LAB — PROTIME-INR
INR: 1.36
Prothrombin Time: 16.9 seconds — ABNORMAL HIGH (ref 11.4–15.2)

## 2017-03-18 LAB — APTT: aPTT: 36 seconds (ref 24–36)

## 2017-03-18 LAB — BRAIN NATRIURETIC PEPTIDE: B Natriuretic Peptide: 1928.8 pg/mL — ABNORMAL HIGH (ref 0.0–100.0)

## 2017-03-18 LAB — TSH: TSH: 2.048 u[IU]/mL (ref 0.350–4.500)

## 2017-03-18 LAB — MAGNESIUM: Magnesium: 2.3 mg/dL (ref 1.7–2.4)

## 2017-03-18 MED ORDER — METOPROLOL SUCCINATE ER 50 MG PO TB24
75.0000 mg | ORAL_TABLET | Freq: Every day | ORAL | Status: DC
  Administered 2017-03-18 – 2017-03-24 (×6): 75 mg via ORAL
  Filled 2017-03-18 (×7): qty 1

## 2017-03-18 MED ORDER — ACETAMINOPHEN 325 MG PO TABS: 650.0000 mg | ORAL_TABLET | ORAL | Status: DC | PRN

## 2017-03-18 MED ORDER — FUROSEMIDE 10 MG/ML IJ SOLN
120.0000 mg | Freq: Two times a day (BID) | INTRAMUSCULAR | Status: DC
  Administered 2017-03-18 – 2017-03-19 (×3): 120 mg via INTRAVENOUS
  Filled 2017-03-18: qty 12
  Filled 2017-03-18: qty 10
  Filled 2017-03-18: qty 12
  Filled 2017-03-18: qty 10

## 2017-03-18 MED ORDER — SODIUM CHLORIDE 0.9 % IV SOLN: 250.0000 mL | INTRAVENOUS | Status: DC | PRN

## 2017-03-18 MED ORDER — POTASSIUM CHLORIDE CRYS ER 20 MEQ PO TBCR
20.0000 meq | EXTENDED_RELEASE_TABLET | Freq: Every day | ORAL | Status: DC
  Administered 2017-03-18 – 2017-03-19 (×2): 20 meq via ORAL
  Filled 2017-03-18 (×2): qty 1

## 2017-03-18 MED ORDER — SODIUM CHLORIDE 0.9% FLUSH
3.0000 mL | Freq: Two times a day (BID) | INTRAVENOUS | Status: DC
  Administered 2017-03-18 – 2017-03-24 (×9): 3 mL via INTRAVENOUS

## 2017-03-18 MED ORDER — PANTOPRAZOLE SODIUM 40 MG PO TBEC
40.0000 mg | DELAYED_RELEASE_TABLET | Freq: Every day | ORAL | Status: DC
  Administered 2017-03-18 – 2017-03-24 (×7): 40 mg via ORAL
  Filled 2017-03-18 (×7): qty 1

## 2017-03-18 MED ORDER — POLYETHYLENE GLYCOL 3350 17 G PO PACK
17.0000 g | PACK | Freq: Every day | ORAL | Status: DC | PRN
  Filled 2017-03-18: qty 1

## 2017-03-18 MED ORDER — SODIUM CHLORIDE 0.9% FLUSH: 3.0000 mL | INTRAVENOUS | Status: DC | PRN

## 2017-03-18 MED ORDER — POLYETHYLENE GLYCOL 3350 17 G PO PACK
17.0000 g | PACK | Freq: Every day | ORAL | Status: DC
  Administered 2017-03-18 – 2017-03-23 (×6): 17 g via ORAL
  Filled 2017-03-18 (×6): qty 1

## 2017-03-18 MED ORDER — CLORAZEPATE DIPOTASSIUM 3.75 MG PO TABS
3.7500 mg | ORAL_TABLET | Freq: Every evening | ORAL | Status: DC | PRN
  Administered 2017-03-18: 3.75 mg via ORAL
  Filled 2017-03-18: qty 1

## 2017-03-18 MED ORDER — APIXABAN 5 MG PO TABS
5.0000 mg | ORAL_TABLET | Freq: Two times a day (BID) | ORAL | Status: DC
  Administered 2017-03-18 – 2017-03-19 (×3): 5 mg via ORAL
  Filled 2017-03-18 (×3): qty 1

## 2017-03-18 MED ORDER — ONDANSETRON HCL 4 MG/2ML IJ SOLN
4.0000 mg | Freq: Four times a day (QID) | INTRAMUSCULAR | Status: DC | PRN
  Administered 2017-03-21: 4 mg via INTRAVENOUS
  Filled 2017-03-18: qty 2

## 2017-03-18 NOTE — Patient Instructions (Addendum)
We are admitting you to the hospital today.   Call the [facility] office at [phone] if you have any questions, problems or concerns.      

## 2017-03-18 NOTE — Progress Notes (Signed)
CARDIOLOGY OFFICE NOTE  Date:  03/18/2017    [name] Date of Birth: 17-Mar-1932 Medical Record #[id number]  PCP:  [name], MD  Cardiologist:  [name]  Chief Complaint  Patient presents with  . Atrial Fibrillation    Follow up visit - seen for Dr. [name]    History of Present Illness: Jimmy Myers is a 83 y.o. male who presents today for a follow up visit. Seen for Dr. [name].   He has systolic heart failure with an EF previously down to 20 to 25% with improvement after medical therapy - up to 50% per echo in August of 2013 and was 45 to 50% by echo in July of 2014. His other issues include atrial fib with prior cardioversion, bradycardia, enlarged ascending aorta (previously followed by Dr. [name] and no longer measured), thyroid nodules (with prior benign biopsy), chronic thrombocytopenia and HTN. Had an event monitor in 2014 - average HR of 61. 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 [facility]  I have seen him several times over the past few years - he has done ok. He has been reluctant to change his medicines. Echo updated in May of 2017 and was unchanged - EF 40 to 45% - we have continued with medical therapy. He has opted to not have any more imaging of his aorta. At visit in November of 2017, HR was lower and I stopped his beta blocker altogether.   I saw him in February of 2018 - he had had the flu - he was very jaundiced. LFTs high. Coumadin running high. He ended up getting CT scan abdomen/pelvis which showed moderate intrahepatic biliary ductal dilation, mild distention of the gallbladder, distention of the CBD suspicious for CBD or pancreatic carcinoma. ERCP was unsuccessful. External biliary drainage was placed by IR. CA 19-9 elevated. 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 He was referred for oncology. Was sent to SNF from the hospital. Noted to be a DNR. His coumadin was held.   I last saw him in April - his oncology plan was unclear to me. He had had some swelling - back on Lasix - lots of extra salt. Remained off his Coumadin due to his liver cancer.   Hospitalized in June with worsening shortness of breath - found to be back in AF with RVR - also with PE noted. He was placedon IV heparin and transitioned to Eliquis. Echo showed worsening EF - down to 20%. Persistent effusions on CXR noted. Looks like he was going to get referred back to [facility] for his aneurysm - he has told me in the past that he did not wish to do this and had no plans to go back and therefore will not.   I then saw him for his post hospital visit - doing poorly. Was back in SR but weak. Rambling. Nauseated. Recent CT scan concerning. His HR noted to have been difficult to control. He is not a candidate for digoxin or amiodarone or really any AAD therapy - "No digoxin due to renal dysfunction.  No amiodarone due to cholangiocarcinoma.  Other antiarrhythmics are limited 2/2 systolic dysfunction". His management limited by known bradycardia in the past.   He called here a few days ago - HR back up -  I suspected he was back in AF.   Comes in today. Here with his wife and son today. He is in a wheelchair today. Not very argumentative like he normally is. Looks like will be having repeat scans in September. Oncology note from yesterday not complete.  Confirmed that he is back in AF today. Rate is not controlled. He feels pretty weak. Weight continues to climb. Up 8 pounds in the past 3 weeks. More swelling. Belly tight. Reports swelling in the perineum as well. More short of breath. He is agreeable to being admitted today for IV diuresis.   Past Medical History:  Diagnosis Date  . A-fib [facility]) May 2013   s/p TEE/cardioversion June 2013  . Anxiety   .  Aortic root enlargement (HCC)    at least 5cm by CT  . Arthritis   . Atrial thrombus   . Bradycardia   . Bronchitis   . Cancer ([place]) 2016   lymphoma  . Chronic anticoagulation    on coumadin - checked in [place]  . Chronic systolic heart failure ([place]) 12/28/2011  . Congestive heart failure (CHF) ([place]) 2013  . Diverticulosis   . Dysrhythmia    AFib  . GERD (gastroesophageal reflux disease)   . Hemorrhoids   . HTN (hypertension)   . PE (pulmonary thromboembolism) ([place]) 01/2017  . Pneumonia May 2013  . Pneumonia 2013  . 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 Thrombocytopenia ([place])     Past Surgical History:  Procedure Laterality Date  . CARDIOVERSION  12/30/2011   Procedure: CARDIOVERSION;  Surgeon: Lelon Perla, MD;  Location: [facility] ENDOSCOPY;  Service: Cardiovascular;  Laterality: N/A;  . CARDIOVERSION  02/16/2012   Procedure: CARDIOVERSION;  Surgeon: Lelon Perla, MD;  Location: [facility] ENDOSCOPY;  Service: Cardiovascular;  Laterality: N/A;  . CATARACT EXTRACTION W/PHACO Left 07/28/2016   Procedure: CATARACT EXTRACTION PHACO AND INTRAOCULAR LENS PLACEMENT LEFT EYE:  CDE: 13.00;  Surgeon: Tonny Branch, MD;  Location: [facility] ORS;  Service: Ophthalmology;  Laterality: Left;  . ENDOSCOPIC RETROGRADE CHOLANGIOPANCREATOGRAPHY (ERCP) WITH PROPOFOL N/A 10/24/2016   Procedure: ENDOSCOPIC RETROGRADE CHOLANGIOPANCREATOGRAPHY (ERCP) WITH PROPOFOL;  Surgeon: Doran Stabler, MD;  Location: [place] ENDOSCOPY;  Service: Endoscopy;  Laterality: N/A;  . ESOPHAGOGASTRODUODENOSCOPY  12/30/2011   Procedure: ESOPHAGOGASTRODUODENOSCOPY (EGD);  Surgeon: Lelon Perla, MD;  Location: [facility] ENDOSCOPY;  Service: Cardiovascular;  Laterality: N/A;  . ESOPHAGOGASTRODUODENOSCOPY  12/30/2011   Procedure: ESOPHAGOGASTRODUODENOSCOPY (EGD);  Surgeon: Lafayette Dragon, MD;  Location: [facility] ENDOSCOPY;   Service: Endoscopy;  Laterality: N/A;  . IR BILIARY STENT(S) EXISTING ACCESS INC DILATION CATH EXCHANGE  12/16/2016  . IR GENERIC HISTORICAL  10/25/2016   IR INT EXT BILIARY DRAIN WITH CHOLANGIOGRAM 10/25/2016 Greggory Keen, MD [facility]-INTERV RAD  . IR GENERIC HISTORICAL  10/28/2016   IR EXCHANGE BILIARY DRAIN 10/28/2016 Greggory Keen, MD [facility]-INTER[facility] RAD  . IR GENERIC HISTORICAL  10/28/2016   IR ENDOLUMINAL BX OF BILIARY TREE 10/28/2016 Greggory Keen, MD [facility]-INTER[facility] RAD  . IR GENERIC HISTORICAL  11/21/2016   IR EXCHANGE BILIARY DRAIN 11/21/2016 Corrie Mckusick, DO [facility]-INTERV RAD  . IR RADIOLOGIST EVAL & MGMT  12/03/2016  . IR RADIOLOGIST EVAL & MGMT  11/11/2016  . IR REMOVAL BILIARY DRAIN  12/30/2016  . KNEE ARTHROSCOPY  rt knee  . PROSTATE SURGERY    . TEE WITHOUT CARDIOVERSION  12/30/2011   Procedure: TRANSESOPHAGEAL ECHOCARDIOGRAM (TEE);  Surgeon: Lelon Perla, MD;  Location: [facility] ENDOSCOPY;  Service: Cardiovascular;  Laterality: N/A;  . TEE WITHOUT CARDIOVERSION  02/16/2012   Procedure: TRANSESOPHAGEAL ECHOCARDIOGRAM (TEE);  Surgeon: Lelon Perla, MD;  Location: [facility] ENDOSCOPY;  Service: Cardiovascular;  Laterality: N/A;     Medications: Current Meds  Medication Sig  . acetaminophen (TYLENOL) 325 MG tablet Take 325 mg by mouth every 6 (six) hours as needed.  Marland Kitchen albuterol (PROVENTIL HFA;VENTOLIN HFA) 108 (90 BASE) MCG/ACT inhaler Inhale 2 puffs into the lungs every 6 (six) hours as needed for wheezing or shortness of breath.  Marland Kitchen apixaban (ELIQUIS) 5 MG TABS tablet Take 1 tablet (5 mg total) by mouth 2 (two) times daily.  . clorazepate (TRANXENE) 3.75 MG tablet Give .05 tablet by mouth daily as needed for anxiety  . fluticasone (FLONASE) 50 MCG/ACT nasal spray Place 1 spray into both nostrils daily as needed for allergies or rhinitis.  . folic acid (FOLVITE) 403 MCG tablet Take 800 mcg by mouth daily.  . furosemide (LASIX) 40 MG tablet Take 3 tablets today and tomorrow - then 2 tablets by by mouth daily  (Patient taking differently: Take 40 mg by mouth daily. )  . metoprolol succinate (TOPROL-XL) 25 MG 24 hr tablet Take 3 tablets (75 mg total) by mouth daily.  . nitroGLYCERIN (NITROSTAT) 0.4 MG SL tablet Place 1 tablet (0.4 mg total) under the tongue every 5 (five) minutes x 3 doses as needed for chest pain.  Marland Kitchen omeprazole (PRILOSEC) 20 MG capsule Take 20 mg by mouth daily before breakfast.   . polyethylene glycol (MIRALAX / GLYCOLAX) packet Take 17 g by mouth at bedtime as needed.   . potassium chloride (K-DUR) 10 MEQ tablet Take 1 tablet (10 mEq total) by mouth daily.     Allergies: Allergies  Allergen Reactions  . Codeine Other (See Comments)    nervousness  . Simvastatin Other (See Comments)    Muscle pain/hurting  . Vicodin [Hydrocodone-Acetaminophen] Other (See Comments)    nervousness    Social History: The patient  reports that he has never smoked. He has never used smokeless tobacco. He reports that he does not drink alcohol or use drugs.   Family History: The patient's family history includes Heart disease in his brother, father, mother, and sister; Hypertension in his sister; Stomach cancer in his brother.   Review of Systems: Please see the history of present illness.   Otherwise, the review of systems is positive for none.   All other systems are reviewed and negative.   Physical Exam: VS:  BP 110/80 (BP Location: Left Arm, Patient Position: Sitting, Cuff Size: Normal)   Pulse (!) 118   Ht 5\' 10"  (1.778 m)   Wt 211 lb 12.8 oz (96.1 kg)   SpO2 96% Comment: at rest  BMI 30.39 kg/m  .  BMI Body mass index is 30.39 kg/m.  Wt Readings from Last 3 Encounters:  03/18/17 211 lb 12.8 oz (96.1 kg)  03/17/17 211 lb 12.8 oz (96.1 kg)  03/11/17 210 lb 9.6 oz (95.5 kg)    General: Elderly. Chronically ill. He is alert and in no acute distress. He is in a wheelchair today. Looks weak.   HEENT: Normal.  Neck: Supple, no JVD, carotid bruits, or masses noted.  Cardiac:  Irregular irregular rhythm. Rate is fast on exam.  Over 2+ edema in the legs. Belly is tight. Sacral edema as well.  Respiratory:  Lungs  are actually fairly clear to auscultation bilaterally with normal work of breathing.  GI: Soft and nontender.  MS: No deformity or atrophy. Gait and ROM intact.  Skin: Warm and dry. Color is normal.  Neuro:  Strength and sensation are intact and no gross focal deficits noted.  Psych: Alert, appropriate and with normal affect. Words are a little hard to understand at times.    LABORATORY DATA:  EKG:  EKG is ordered today. This demonstrates recurrent AF with a rate of 118. Reviewed with Dr. Acie Fredrickson (DOD)  Lab Results  Component Value Date   WBC 5.0 03/05/2017   HGB 12.3 (L) 03/05/2017   HCT 38.8 03/05/2017   PLT 94 (L) 03/05/2017   GLUCOSE 109 03/05/2017   CHOL 85 12/29/2011   TRIG 30 12/29/2011   HDL 40 12/29/2011   LDLCALC 39 12/29/2011   ALT 45 03/05/2017   AST 32 03/05/2017   NA 144 03/05/2017   K 4.0 03/05/2017   CL 105 03/02/2017   CREATININE 1.3 03/05/2017   BUN 32.6 (H) 03/05/2017   CO2 29 03/05/2017   TSH 0.904 02/17/2017   INR 0.99 12/16/2016     BNP (last 3 results)  Recent Labs  02/17/17 1819  BNP 596.1*    ProBNP (last 3 results)  Recent Labs  12/08/16 1238 12/23/16 0953 01/12/17 0927  PROBNP 3,713* 5,245* 4,653*     Other Studies Reviewed Today:  CTA CHEST IMPRESSION 01/2017: Small pulmonary embolus in a RIGHT lower lobe pulmonary artery with small BILATERAL pleural effusions with compressive atelectasis of the posterior lower lobes.  Stable LEFT thyroid mass.  Coronary arterial calcification.  Aneurysmal dilatation ascending thoracic aorta 4.8 cm transverse unchanged, recommendation below.  Ascending thoracic aortic aneurysm. Recommend semi-annual imaging followup by CTA or MRA and referral to cardiothoracic surgery if not already obtained. This recommendation follows  2010 ACCF/AHA/AATS/ACR/ASA/SCA/SCAI/SIR/STS/SVM Guidelines for the Diagnosis and Management of Patients With Thoracic Aortic Disease. Circulation. 2010; 121: N361-W431  Aortic Atherosclerosis (ICD10-I70.0).  Aortic aneurysm NOS (ICD10-I71.9).  Critical Value/emergent results were called by telephone at the time of interpretation on 02/17/2017 at 2:29 pm to Dr. Quintella Reichert , who verbally acknowledged these results.   Electronically Signed By: Lavonia Dana M.D. On: 02/17/2017 14:29   Echo Study Conclusions from 01/2017  - Left ventricle: The cavity size was severely dilated. Systolic function was severely reduced. The estimated ejection fraction was in the range of 20% to 25%. Severe diffuse hypokinesis with regional variations. The study was not technically sufficient to allow evaluation of LV diastolic dysfunction due to atrial fibrillation. - Aortic valve: Moderately to severely calcified annulus. Trileaflet; mildly thickened, moderately calcified leaflets. Valve area (VTI): 1.92 cm^2. Valve area (Vmax): 1.77 cm^2. Valve area (Vmean): 2.15 cm^2. - Aorta: Aortic root dimension: 50 mm (ED). - Aortic root: The aortic root was severely dilated. - Left atrium: The atrium was moderately to severely dilated. - Right ventricle: The cavity size was mildly dilated. Wall thickness was normal. - Pulmonary arteries: PA peak pressure: 34 mm Hg (S).   Assessment / Plan:  1. 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 Has had XRT - recent scans a little worrisome - plans to repeat in September - I think his overall prognosis is quite poor. Oncology note from yesterday's visit not complete for review.   2. Recurrent AF - hard to control his rate and already deemed at last admission to not be a candidate for AAD therapy - has had known bradycardia  as well. He is on Eliquis as well.   3. PE - transitioned to  Eliquis - discussed with pharmacy at last visit here - will plan to follow the guidelines for 5 mg BID for 3 months and then will move to the AF dosing and cut the dose back.  Unfortunately, there is really no great option for his anticoagulation given his cancer. He had grossly elevated INRs on Warfarin. Limited data on Eliquis in this setting.   4. Bradycardia - not an issue at this time but still worrisome.   4. Systolic heart failure - EF back down to 20% - off CCB therapy - overall prognosis still looks very poor to me. Short of breath with just talking here - refused admission at last visit but very agreeable today - I find this worrisome.  Would consider palliative care consult as well.   5. Enlarged aorta - previously followed by Dr. [name] - he has previously decided that he would not have any further evaluation and there are no plans for surgical intervention.   Current medicines are reviewed with the patient today.  The patient does not have concerns regarding medicines other than what has been noted above.  The following changes have been made:  See above.  Labs/ tests ordered today include:    Orders Placed This Encounter  Procedures  . EKG 12-Lead     Disposition:   Admit to [facility] for IV diuresis. Discussed/seen with Dr. Acie Fredrickson who is in agreement with this plan of care. Family will transport.   Patient is agreeable to this plan and will call if any problems develop in the interim.   Signed: Truitt Merle, NP alal 03/18/2017 2:52 PM  [place] [address] [place], [place]  [address] Phone: [phone] Fax: [phone]

## 2017-03-18 NOTE — H&P (Signed)
Office Visit   03/18/2017 [facility]  [place], Marlane Hatcher, NP  Cardiology   PAF (paroxysmal atrial fibrillation) [facility]) +2 more  Dx   Atrial Fibrillation ; Referred by [name], MD  Reason for Visit   Additional Documentation   Vitals:   BP 110/80 (BP Location: Left Arm, Patient Position: Sitting, Cuff Size: Normal)   Pulse  118   Ht 5\' 10"  (1.778 m)   Wt 211 lb 12.8 oz (96.1 kg)   SpO2 96%    BMI 30.39 kg/m   BSA 2.18 m      More Vitals   Flowsheets:   Custom Formula Data,   Anthropometrics,   MEWS Score     Encounter Info:   Billing Info,   History,   Allergies,   Detailed Report     All Notes   Procedures by Burtis Junes, NP at 03/18/2017 3:11 PM   Author: Burtis Junes, NP Author Type: Nurse Practitioner Filed: 03/18/2017 3:11 PM  Note Status: Signed Cosign: Cosign Not Required Encounter Date: 03/18/2017  Editor: Sallee Provencal L        Scan on 03/18/2017 3:11 PM by Sallee Provencal L : Ekg - [place] on 03/18/2017 3:11 PM by Sallee Provencal L : Ekg - [facility]  Progress Notes by Burtis Junes, NP at 03/18/2017 2:00 PM   Author: Burtis Junes, NP Author Type: Nurse Practitioner Filed: 03/18/2017 3:02 PM  Note Status: Signed Cosign: Cosign Not Required Encounter Date: 03/18/2017  Editor: Burtis Junes, NP (Nurse Practitioner)  Expand All Collapse All       CARDIOLOGY OFFICE NOTE  Date:  03/18/2017    Jimmy Myers Date of Birth: 24-Feb-1932 Medical Record #[id number]  PCP:  [name], MD         Cardiologist:  [name] [facility]      Chief Complaint  Patient presents with  . Atrial Fibrillation    Follow up visit - seen for Dr. [name]    History of Present Illness: Jimmy Myers is a 83 y.o. male who presents today for a follow up visit. Seen for Dr. [name].   He has systolic heart failure with an EF previously down to 20 to 25% with improvement after medical  therapy - up to 50% per echo in August of 2013 and was 45 to 50% by echo in July of 2014. His other issues include atrial fib with prior cardioversion, bradycardia, enlarged ascending aorta (previously followed by Dr. [name] and no longer measured), thyroid nodules (with priorbenign biopsy), chronic thrombocytopenia and HTN. Had an event monitor in 2014 - average HR of 61. 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 [facility]  I have seen him several times over the past few years - he has done ok. He has been reluctant to change his medicines. Echo updated in May of 2017 and was unchanged - EF 40 to 45% - we have continued with medical therapy. He has opted to not have any more imaging of his aorta. At visit in November of 2017, HR was lower and I stopped hisbeta blocker altogether.   I saw him in February of 2018 - he had had the flu - he was very jaundiced. LFTs high. Coumadin running high. He ended up getting CT  scan abdomen/pelvis which showed moderate intrahepatic biliary ductal dilation, mild distention of the gallbladder, distention of the CBD suspicious for CBD or pancreatic carcinoma. ERCP was unsuccessful. External biliary drainage was placed by IR. CA 19-9 elevated. 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 He was referred for oncology. Was sent to SNF from the hospital. Noted to be a DNR. His coumadin was held.   I last saw him in April - his oncology plan was unclear to me. He had had some swelling - back on Lasix - lots of extra salt. Remained off his Coumadin due to his liver cancer.   Hospitalized in June with worsening shortness of breath - found to be back in AF with RVR - also with PE noted. He was placedon IV heparin and transitioned to Eliquis. Echo showed worsening  EF - down to 20%. Persistent effusions on CXR noted. Looks like he was going to get referred back to [facility] for his aneurysm - he has told me in the past that he did not wish to do this and had no plans to go back and therefore will not.   I then saw him for his post hospital visit - doing poorly. Was back in SR but weak. Rambling. Nauseated. Recent CT scan concerning. His HR noted to have been difficult to control. He is not a candidate for digoxin or amiodarone or really any AAD therapy - "No digoxin due to renal dysfunction. No amiodarone due to cholangiocarcinoma. Other antiarrhythmics are limited 2/2 systolic dysfunction". His management limited by known bradycardia in the past.   He called here a few days ago - HR back up - I suspected he was back in AF.   Comes in today. Here with his wife and son today. He is in a wheelchair today. Not very argumentative like he normally is. Looks like will be having repeat scans in September. Oncology note from yesterday not complete.  Confirmed that he is back in AF today. Rate is not controlled. He feels pretty weak. Weight continues to climb. Up 8 pounds in the past 3 weeks. More swelling. Belly tight. Reports swelling in the perineum as well. More short of breath. He is agreeable to being admitted today for IV diuresis.       Past Medical History:  Diagnosis Date  . A-fib [facility]) May 2013   s/p TEE/cardioversion June 2013  . Anxiety   . Aortic root enlargement (HCC)    at least 5cm by CT  . Arthritis   . Atrial thrombus   . Bradycardia   . Bronchitis   . Cancer ([place]) 2016   lymphoma  . Chronic anticoagulation    on coumadin - checked in [place]  . Chronic systolic heart failure ([place]) 12/28/2011  . Congestive heart failure (CHF) ([place]) 2013  . Diverticulosis   . Dysrhythmia    AFib  . GERD (gastroesophageal reflux disease)   . Hemorrhoids   . HTN (hypertension)   . PE (pulmonary thromboembolism) ([place]) 01/2017  . Pneumonia  May 2013  . Pneumonia 2013  . 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 Thrombocytopenia ([place])          Past Surgical History:  Procedure Laterality Date  . CARDIOVERSION  12/30/2011   Procedure: CARDIOVERSION;  Surgeon: Lelon Perla, MD;  Location:  [place] ENDOSCOPY;  Service: Cardiovascular;  Laterality: N/A;  . CARDIOVERSION  02/16/2012   Procedure: CARDIOVERSION;  Surgeon: Lelon Perla, MD;  Location: [facility] ENDOSCOPY;  Service: Cardiovascular;  Laterality: N/A;  . CATARACT EXTRACTION W/PHACO Left 07/28/2016   Procedure: CATARACT EXTRACTION PHACO AND INTRAOCULAR LENS PLACEMENT LEFT EYE:  CDE: 13.00;  Surgeon: Tonny Branch, MD;  Location: [facility];  Service: Ophthalmology;  Laterality: Left;  . ENDOSCOPIC RETROGRADE CHOLANGIOPANCREATOGRAPHY (ERCP) WITH PROPOFOL N/A 10/24/2016   Procedure: ENDOSCOPIC RETROGRADE CHOLANGIOPANCREATOGRAPHY (ERCP) WITH PROPOFOL;  Surgeon: Doran Stabler, MD;  Location: [place] ENDOSCOPY;  Service: Endoscopy;  Laterality: N/A;  . ESOPHAGOGASTRODUODENOSCOPY  12/30/2011   Procedure: ESOPHAGOGASTRODUODENOSCOPY (EGD);  Surgeon: Lelon Perla, MD;  Location: [facility] ENDOSCOPY;  Service: Cardiovascular;  Laterality: N/A;  . ESOPHAGOGASTRODUODENOSCOPY  12/30/2011   Procedure: ESOPHAGOGASTRODUODENOSCOPY (EGD);  Surgeon: Lafayette Dragon, MD;  Location: [facility] ENDOSCOPY;  Service: Endoscopy;  Laterality: N/A;  . IR BILIARY STENT(S) EXISTING ACCESS INC DILATION CATH EXCHANGE  12/16/2016  . IR GENERIC HISTORICAL  10/25/2016   IR INT EXT BILIARY DRAIN WITH CHOLANGIOGRAM 10/25/2016 Greggory Keen, MD [facility]-INTER[facility] RAD  . IR GENERIC HISTORICAL  10/28/2016   IR EXCHANGE BILIARY DRAIN 10/28/2016 Greggory Keen, MD [facility]  . IR GENERIC HISTORICAL  10/28/2016   IR ENDOLUMINAL BX OF BILIARY TREE 10/28/2016 Greggory Keen, MD [facility]  . IR  GENERIC HISTORICAL  11/21/2016   IR EXCHANGE BILIARY DRAIN 11/21/2016 Corrie Mckusick, DO [facility]-INTERV RAD  . IR RADIOLOGIST EVAL & MGMT  12/03/2016  . IR RADIOLOGIST EVAL & MGMT  11/11/2016  . IR REMOVAL BILIARY DRAIN  12/30/2016  . KNEE ARTHROSCOPY  rt knee  . PROSTATE SURGERY    . TEE WITHOUT CARDIOVERSION  12/30/2011   Procedure: TRANSESOPHAGEAL ECHOCARDIOGRAM (TEE);  Surgeon: Lelon Perla, MD;  Location: [facility] ENDOSCOPY;  Service: Cardiovascular;  Laterality: N/A;  . TEE WITHOUT CARDIOVERSION  02/16/2012   Procedure: TRANSESOPHAGEAL ECHOCARDIOGRAM (TEE);  Surgeon: Lelon Perla, MD;  Location: [facility] ENDOSCOPY;  Service: Cardiovascular;  Laterality: N/A;     Medications: ActiveMedications      Current Meds  Medication Sig  . acetaminophen (TYLENOL) 325 MG tablet Take 325 mg by mouth every 6 (six) hours as needed.  Marland Kitchen albuterol (PROVENTIL HFA;VENTOLIN HFA) 108 (90 BASE) MCG/ACT inhaler Inhale 2 puffs into the lungs every 6 (six) hours as needed for wheezing or shortness of breath.  Marland Kitchen apixaban (ELIQUIS) 5 MG TABS tablet Take 1 tablet (5 mg total) by mouth 2 (two) times daily.  . clorazepate (TRANXENE) 3.75 MG tablet Give .05 tablet by mouth daily as needed for anxiety  . fluticasone (FLONASE) 50 MCG/ACT nasal spray Place 1 spray into both nostrils daily as needed for allergies or rhinitis.  . folic acid (FOLVITE) 540 MCG tablet Take 800 mcg by mouth daily.  . furosemide (LASIX) 40 MG tablet Take 3 tablets today and tomorrow - then 2 tablets by by mouth daily (Patient taking differently: Take 40 mg by mouth daily. )  . metoprolol succinate (TOPROL-XL) 25 MG 24 hr tablet Take 3 tablets (75 mg total) by mouth daily.  . nitroGLYCERIN (NITROSTAT) 0.4 MG SL tablet Place 1 tablet (0.4 mg total) under the tongue every 5 (five) minutes x 3 doses as needed for chest pain.  Marland Kitchen omeprazole (PRILOSEC) 20 MG capsule Take 20 mg by mouth daily before breakfast.   . polyethylene glycol (MIRALAX /  GLYCOLAX) packet Take 17 g by mouth at bedtime as needed.   Marland Kitchen  potassium chloride (K-DUR) 10 MEQ tablet Take 1 tablet (10 mEq total) by mouth daily.       Allergies:      Allergies  Allergen Reactions  . Codeine Other (See Comments)    nervousness  . Simvastatin Other (See Comments)    Muscle pain/hurting  . Vicodin [Hydrocodone-Acetaminophen] Other (See Comments)    nervousness    Social History: The patient  reports that he has never smoked. He has never used smokeless tobacco. He reports that he does not drink alcohol or use drugs.   Family History: The patient's family history includes Heart disease in his brother, father, mother, and sister; Hypertension in his sister; Stomach cancer in his brother.   Review of Systems: Please see the history of present illness.   Otherwise, the review of systems is positive for none.   All other systems are reviewed and negative.   Physical Exam: VS:  BP 110/80 (BP Location: Left Arm, Patient Position: Sitting, Cuff Size: Normal)   Pulse (!) 118   Ht 5\' 10"  (1.778 m)   Wt 211 lb 12.8 oz (96.1 kg)   SpO2 96% Comment: at rest  BMI 30.39 kg/m  .  BMI Body mass index is 30.39 kg/m.     Wt Readings from Last 3 Encounters:  03/18/17 211 lb 12.8 oz (96.1 kg)  03/17/17 211 lb 12.8 oz (96.1 kg)  03/11/17 210 lb 9.6 oz (95.5 kg)    General: Elderly. Chronically ill. He is alert and in no acute distress. He is in a wheelchair today. Looks weak.   HEENT: Normal.  Neck: Supple, no JVD, carotid bruits, or masses noted.  Cardiac: Irregular irregular rhythm. Rate is fast on exam.  Over 2+ edema in the legs. Belly is tight. Sacral edema as well.  Respiratory:  Lungs are actually fairly clear to auscultation bilaterally with normal work of breathing.  GI: Soft and nontender.  MS: No deformity or atrophy. Gait and ROM intact.  Skin: Warm and dry. Color is normal.  Neuro:  Strength and sensation are intact and no gross focal  deficits noted.  Psych: Alert, appropriate and with normal affect. Words are a little hard to understand at times.    LABORATORY DATA:  EKG:  EKG is ordered today. This demonstrates recurrent AF with a rate of 118. Reviewed with Dr. Acie Fredrickson (DOD)  RecentLabs       Lab Results  Component Value Date   WBC 5.0 03/05/2017   HGB 12.3 (L) 03/05/2017   HCT 38.8 03/05/2017   PLT 94 (L) 03/05/2017   GLUCOSE 109 03/05/2017   CHOL 85 12/29/2011   TRIG 30 12/29/2011   HDL 40 12/29/2011   LDLCALC 39 12/29/2011   ALT 45 03/05/2017   AST 32 03/05/2017   NA 144 03/05/2017   K 4.0 03/05/2017   CL 105 03/02/2017   CREATININE 1.3 03/05/2017   BUN 32.6 (H) 03/05/2017   CO2 29 03/05/2017   TSH 0.904 02/17/2017   INR 0.99 12/16/2016       BNP (last 3 results)  RecentLabs(withinlast365days)   Recent Labs  02/17/17 1819  BNP 596.1*      ProBNP (last 3 results)  RecentLabs(withinlast365days)   Recent Labs  12/08/16 1238 12/23/16 0953 01/12/17 0927  PROBNP 3,713* 5,245* 4,653*       Other Studies Reviewed Today:  CTA CHEST IMPRESSION 01/2017: Small pulmonary embolus in a RIGHT lower lobe pulmonary artery with small BILATERAL pleural effusions with compressive atelectasis of  the posterior lower lobes.  Stable LEFT thyroid mass.  Coronary arterial calcification.  Aneurysmal dilatation ascending thoracic aorta 4.8 cm transverse unchanged, recommendation below.  Ascending thoracic aortic aneurysm. Recommend semi-annual imaging followup by CTA or MRA and referral to cardiothoracic surgery if not already obtained. This recommendation follows 2010 ACCF/AHA/AATS/ACR/ASA/SCA/SCAI/SIR/STS/SVM Guidelines for the Diagnosis and Management of Patients With Thoracic Aortic Disease. Circulation. 2010; 121: U045-W098  Aortic Atherosclerosis (ICD10-I70.0).  Aortic aneurysm NOS (ICD10-I71.9).  Critical Value/emergent results  were called by telephone at the time of interpretation on 02/17/2017 at 2:29 pm to Dr. Quintella Reichert , who verbally acknowledged these results.   Electronically Signed By: Lavonia Dana M.D. On: 02/17/2017 14:29   Echo Study Conclusions from 01/2017  - Left ventricle: The cavity size was severely dilated. Systolic function was severely reduced. The estimated ejection fraction was in the range of 20% to 25%.Severe diffuse hypokinesis with regional variations. The study was not technically sufficient to allow evaluation of LV diastolic dysfunction due to atrial fibrillation. - Aortic valve: Moderately to severely calcified annulus. Trileaflet; mildly thickened, moderately calcified leaflets. Valve area (VTI): 1.92 cm^2. Valve area (Vmax): 1.77 cm^2. Valve area (Vmean): 2.15 cm^2. - Aorta: Aortic root dimension: 50 mm (ED). - Aortic root: The aortic root was severely dilated. - Left atrium: The atrium was moderately to severely dilated. - Right ventricle: The cavity size was mildly dilated. Wall thickness was normal. - Pulmonary arteries: PA peak pressure: 34 mm Hg (S).   Assessment / Plan:  1. 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 Has had XRT - recent scans a little worrisome - plans to repeat in September - I think his overall prognosis is quite poor. Oncology note from yesterday's visit not complete for review.   2. Recurrent AF - hard to control his rate and already deemed at last admission to not be a candidate for AAD therapy - has had known bradycardia as well. He is on Eliquis as well.   3. PE - transitioned to Eliquis - discussed with pharmacy at last visit here - will plan to follow the guidelines for 5 mg BID for 3 months and then will move to the AF dosing and cut the dose back. Unfortunately, there is really no great option for his anticoagulation given his cancer. He had grossly  elevated INRs on Warfarin. Limited data on Eliquis in this setting.   4. Bradycardia - not an issue at this time but still worrisome.   4. Systolic heart failure - EF back down to 20% - off CCB therapy - overall prognosis still looks very poor to me. Short of breath with just talking here - refused admission at last visit but very agreeable today - I find this worrisome.  Would consider palliative care consult as well.   5. Enlarged aorta - previously followed by Dr. [name] - he has previously decided that he wouldnot have any further evaluation and there are no plans for surgical intervention.   Current medicines are reviewed with the patient today.  The patient does not have concerns regarding medicines other than what has been noted above.  The following changes have been made:  See above.  Labs/ tests ordered today include:       Orders Placed This Encounter  Procedures  . EKG 12-Lead     Disposition:   Admit to [facility] for IV diuresis. Discussed/seen with Dr. Acie Fredrickson who is in agreement with this plan of care. Family will transport.   Patient  is agreeable to this plan and will call if any problems develop in the interim.   Signed: Truitt Merle, NP alal 03/18/2017 2:52 PM  [place] [address] [place],   [address] Phone: [phone] Fax: [phone]           Patient Instructions by Burtis Junes, NP at 03/18/2017 2:00 PM   Author: Burtis Junes, NP Author Type: Nurse Practitioner Filed: 03/18/2017 2:41 PM  Note Status: Addendum Cosign: Cosign Not Required Encounter Date: 03/18/2017  Editor: Burtis Junes, NP (Nurse Practitioner)  Prior Versions: 1. Burtis Junes, NP (Nurse Practitioner) at 03/18/2017 1:34 PM - Signed    We are admitting you to the hospital today  Call the [place] office at [phone] if you have any questions, problems or concerns.          Instructions   We are admitting you to the hospital today  Call the [place] office at [phone] if you have any questions, problems or concerns.       Communications      [facility] Provider CC Chart Rep sent to [name], MD     Chart Routed to Thompson Grayer, MD and Elliot Simoneaux, Wonda Cheng, MD     Chart Routed to Thompson Grayer, MD and Irelyn Perfecto, Wonda Cheng, MD  Media   Electronic signature on 03/18/2017 1:34 PM   Communication Routing History   Recipient Method Sent by Date Sent  [name], MD In Philis Pique, NP 03/18/2017     Orders Placed      EKG 12-Lead  Medication Changes     None    Medication List  Visit Diagnoses      PAF (paroxysmal atrial fibrillation) ([place])    SOB (shortness of breath)    Chronic systolic heart failure ([place])    Problem List  Level of Service   Level of Service  PR OFFICE OUTPATIENT VISIT 25 MINUTES [20355]  All Charges for This Encounter   Code Description Service Date Service Provider Modifiers Qty  [phone] PR OFFICE OUTPATIENT VISIT 25 MINUTES 03/18/2017 Burtis Junes, NP  1  93000 PR ELECTROCARDIOGRAM, COMPLETE 03/18/2017 Burtis Junes, NP  1    Attending Note:   The patient was seen and examined.  Agree with assessment and plan as noted above.  Changes made to the above note as needed.  Patient seen and independently examined with Truitt Merle, NP.   We discussed all aspects of the encounter. I agree with the assessment and plan as stated above.  1.   I agree with assessment . He needs to be hospitalized.    I have spent a total of 40 minutes with patient reviewing hospital  notes , telemetry, EKGs, labs and examining patient as well as establishing an assessment and plan that was discussed with the patient. > 50% of time was spent in direct patient care.    Thayer Headings, Brooke Bonito., MD, [facility] 03/19/2017, 6:37 PM [address]  [place] Pager [phone]

## 2017-03-18 NOTE — Progress Notes (Signed)
Patient admitted to room 3E27 with wife at bedside.  Patient is a direct admit.  Oriented to room and call bell and telephone within reach.

## 2017-03-19 DIAGNOSIS — C880 Waldenstrom macroglobulinemia: Secondary | ICD-10-CM

## 2017-03-19 DIAGNOSIS — Z9889 Other specified postprocedural states: Secondary | ICD-10-CM | POA: Diagnosis not present

## 2017-03-19 DIAGNOSIS — N182 Chronic kidney disease, stage 2 (mild): Secondary | ICD-10-CM | POA: Diagnosis not present

## 2017-03-19 DIAGNOSIS — Z800 Family history of malignant neoplasm of digestive organs: Secondary | ICD-10-CM | POA: Diagnosis not present

## 2017-03-19 DIAGNOSIS — N179 Acute kidney failure, unspecified: Secondary | ICD-10-CM | POA: Diagnosis present

## 2017-03-19 DIAGNOSIS — C221 Intrahepatic bile duct carcinoma: Secondary | ICD-10-CM | POA: Diagnosis present

## 2017-03-19 DIAGNOSIS — I2510 Atherosclerotic heart disease of native coronary artery without angina pectoris: Secondary | ICD-10-CM

## 2017-03-19 DIAGNOSIS — I5043 Acute on chronic combined systolic (congestive) and diastolic (congestive) heart failure: Secondary | ICD-10-CM

## 2017-03-19 DIAGNOSIS — N183 Chronic kidney disease, stage 3 (moderate): Secondary | ICD-10-CM | POA: Diagnosis not present

## 2017-03-19 DIAGNOSIS — K219 Gastro-esophageal reflux disease without esophagitis: Secondary | ICD-10-CM | POA: Diagnosis present

## 2017-03-19 DIAGNOSIS — I130 Hypertensive heart and chronic kidney disease with heart failure and stage 1 through stage 4 chronic kidney disease, or unspecified chronic kidney disease: Secondary | ICD-10-CM | POA: Diagnosis present

## 2017-03-19 DIAGNOSIS — Z7901 Long term (current) use of anticoagulants: Secondary | ICD-10-CM | POA: Diagnosis not present

## 2017-03-19 DIAGNOSIS — I480 Paroxysmal atrial fibrillation: Secondary | ICD-10-CM | POA: Diagnosis present

## 2017-03-19 DIAGNOSIS — Z9842 Cataract extraction status, left eye: Secondary | ICD-10-CM | POA: Diagnosis not present

## 2017-03-19 DIAGNOSIS — N185 Chronic kidney disease, stage 5: Secondary | ICD-10-CM | POA: Diagnosis not present

## 2017-03-19 DIAGNOSIS — Z66 Do not resuscitate: Secondary | ICD-10-CM | POA: Diagnosis present

## 2017-03-19 DIAGNOSIS — Z7189 Other specified counseling: Secondary | ICD-10-CM | POA: Diagnosis not present

## 2017-03-19 DIAGNOSIS — Z86711 Personal history of pulmonary embolism: Secondary | ICD-10-CM | POA: Diagnosis not present

## 2017-03-19 DIAGNOSIS — Z9221 Personal history of antineoplastic chemotherapy: Secondary | ICD-10-CM | POA: Diagnosis not present

## 2017-03-19 DIAGNOSIS — R001 Bradycardia, unspecified: Secondary | ICD-10-CM | POA: Diagnosis present

## 2017-03-19 DIAGNOSIS — I482 Chronic atrial fibrillation: Secondary | ICD-10-CM | POA: Diagnosis present

## 2017-03-19 DIAGNOSIS — I5022 Chronic systolic (congestive) heart failure: Secondary | ICD-10-CM | POA: Diagnosis not present

## 2017-03-19 DIAGNOSIS — Z8249 Family history of ischemic heart disease and other diseases of the circulatory system: Secondary | ICD-10-CM | POA: Diagnosis not present

## 2017-03-19 DIAGNOSIS — I5021 Acute systolic (congestive) heart failure: Secondary | ICD-10-CM | POA: Diagnosis not present

## 2017-03-19 DIAGNOSIS — Z961 Presence of intraocular lens: Secondary | ICD-10-CM | POA: Diagnosis present

## 2017-03-19 DIAGNOSIS — K830 Cholangitis: Secondary | ICD-10-CM | POA: Diagnosis present

## 2017-03-19 DIAGNOSIS — Z515 Encounter for palliative care: Secondary | ICD-10-CM | POA: Diagnosis not present

## 2017-03-19 DIAGNOSIS — I4891 Unspecified atrial fibrillation: Secondary | ICD-10-CM | POA: Diagnosis not present

## 2017-03-19 DIAGNOSIS — I4892 Unspecified atrial flutter: Secondary | ICD-10-CM | POA: Diagnosis present

## 2017-03-19 DIAGNOSIS — I495 Sick sinus syndrome: Secondary | ICD-10-CM | POA: Diagnosis not present

## 2017-03-19 DIAGNOSIS — N170 Acute kidney failure with tubular necrosis: Secondary | ICD-10-CM | POA: Diagnosis not present

## 2017-03-19 DIAGNOSIS — C240 Malignant neoplasm of extrahepatic bile duct: Secondary | ICD-10-CM | POA: Diagnosis present

## 2017-03-19 DIAGNOSIS — I483 Typical atrial flutter: Secondary | ICD-10-CM | POA: Diagnosis not present

## 2017-03-19 DIAGNOSIS — N184 Chronic kidney disease, stage 4 (severe): Secondary | ICD-10-CM | POA: Diagnosis present

## 2017-03-19 DIAGNOSIS — Z885 Allergy status to narcotic agent status: Secondary | ICD-10-CM | POA: Diagnosis not present

## 2017-03-19 DIAGNOSIS — K831 Obstruction of bile duct: Secondary | ICD-10-CM | POA: Diagnosis present

## 2017-03-19 DIAGNOSIS — R188 Other ascites: Secondary | ICD-10-CM | POA: Diagnosis present

## 2017-03-19 LAB — BASIC METABOLIC PANEL
Anion gap: 9 (ref 5–15)
BUN: 47 mg/dL — ABNORMAL HIGH (ref 6–20)
CO2: 27 mmol/L (ref 22–32)
Calcium: 8.7 mg/dL — ABNORMAL LOW (ref 8.9–10.3)
Chloride: 105 mmol/L (ref 101–111)
Creatinine, Ser: 1.79 mg/dL — ABNORMAL HIGH (ref 0.61–1.24)
GFR calc Af Amer: 39 mL/min — ABNORMAL LOW (ref >60)
GFR calc non Af Amer: 34 mL/min — ABNORMAL LOW (ref >60)
Glucose, Bld: 119 mg/dL — ABNORMAL HIGH (ref 65–99)
Potassium: 4 mmol/L (ref 3.5–5.1)
Sodium: 141 mmol/L (ref 135–145)

## 2017-03-19 MED ORDER — AMIODARONE HCL IN DEXTROSE 360-4.14 MG/200ML-% IV SOLN
30.0000 mg/h | INTRAVENOUS | Status: DC
  Administered 2017-03-19 – 2017-03-23 (×9): 30 mg/h via INTRAVENOUS
  Filled 2017-03-19 (×7): qty 200

## 2017-03-19 MED ORDER — AMIODARONE LOAD VIA INFUSION
150.0000 mg | Freq: Once | INTRAVENOUS | Status: AC
  Administered 2017-03-19: 150 mg via INTRAVENOUS
  Filled 2017-03-19: qty 83.34

## 2017-03-19 MED ORDER — AMIODARONE HCL IN DEXTROSE 360-4.14 MG/200ML-% IV SOLN
60.0000 mg/h | INTRAVENOUS | Status: AC
  Administered 2017-03-19: 60 mg/h via INTRAVENOUS
  Filled 2017-03-19 (×2): qty 200

## 2017-03-19 MED ORDER — ALPRAZOLAM 0.25 MG PO TABS
0.2500 mg | ORAL_TABLET | Freq: Three times a day (TID) | ORAL | Status: DC | PRN
  Administered 2017-03-19 – 2017-03-23 (×3): 0.25 mg via ORAL
  Filled 2017-03-19 (×3): qty 1

## 2017-03-19 NOTE — Care Management Note (Signed)
Case Management Note  Patient Details  Name: Jimmy Myers MRN: [id number] Date of Birth: 05-Jan-1932  Subjective/Objective:     CHF              Action/Plan: CM following for DCP; Jimmy Myers  02/19/2017 - Action/Plan: PTA pt lived at home with wife- insurance check completed on noacs- copay $45 for either- MD notified- spoke with pt at bedside- per pt he goes to the [facility] clinic- [place] PCP is Dr. Beckey Downing- [facility]. (pt is not 100% service connected)- contact at the [facility] clinic is Margaret-CSW at ext. [phone]- [phone]- pt states that he could afford $45 copay but would like to see if [facility] would approve which ever drug he is placed on- CM will plan to contact clinic and fax d/c summary along with script once available. Pt reports that he has already contacted the [facility]- pt also has Medicare coverage with [employer] and a PCP under that with  Dr. Wendi Snipes.  - CM will follow and provide pt 30 day free card to chosen drug for discharge. Kenn File RN  Expected Discharge Date:    possibly 03/22/2017              Expected Discharge Plan:  Home/Self Care  In-House Referral:    [facility] Discharge planning Services  CM Consult  Status of Service:  In process, will continue to follow  Sherrilyn Rist [phone] 03/19/2017, 12:48 PM

## 2017-03-19 NOTE — Progress Notes (Signed)
  Amiodarone Drug - Drug Interaction Consult Note  Recommendations:  N/A  Amiodarone is metabolized by the cytochrome P450 system and therefore has the potential to cause many drug interactions. Amiodarone has an average plasma half-life of 50 days (range 20 to 100 days).   There is potential for drug interactions to occur several weeks or months after stopping treatment and the onset of drug interactions may be slow after initiating amiodarone.   []  Statins: Increased risk of myopathy. Simvastatin- restrict dose to 20mg  daily. Other statins: counsel patients to report any muscle pain or weakness immediately.  []  Anticoagulants: Amiodarone can increase anticoagulant effect. Consider warfarin dose reduction. Patients should be monitored closely and the dose of anticoagulant altered accordingly, remembering that amiodarone levels take several weeks to stabilize.  []  Antiepileptics: Amiodarone can increase plasma concentration of phenytoin, the dose should be reduced. Note that small changes in phenytoin dose can result in large changes in levels. Monitor patient and counsel on signs of toxicity.  [x]  Beta blockers: increased risk of bradycardia, AV block and myocardial depression. Sotalol - avoid concomitant use.  []   Calcium channel blockers (diltiazem and verapamil): increased risk of bradycardia, AV block and myocardial depression.  []   Cyclosporine: Amiodarone increases levels of cyclosporine. Reduced dose of cyclosporine is recommended.  []  Digoxin dose should be halved when amiodarone is started.  [x]  Diuretics: increased risk of cardiotoxicity if hypokalemia occurs.  []  Oral hypoglycemic agents (glyburide, glipizide, glimepiride): increased risk of hypoglycemia. Patient's glucose levels should be monitored closely when initiating amiodarone therapy.   []  Drugs that prolong the QT interval:  Torsades de pointes risk may be increased with concurrent use - avoid if possible.  Monitor  QTc, also keep magnesium/potassium WNL if concurrent therapy can't be avoided. Marland Kitchen Antibiotics: e.g. fluoroquinolones, erythromycin. . Antiarrhythmics: e.g. quinidine, procainamide, disopyramide, sotalol. . Antipsychotics: e.g. phenothiazines, haloperidol.  . Lithium, tricyclic antidepressants, and methadone. Thank You,  Remigio Eisenmenger D. Mina Marble, PharmD, BCPS Pager:  [phone] 03/19/2017, 12:28 PM

## 2017-03-19 NOTE — Progress Notes (Signed)
   03/18/17 2337  Vitals  BP 105/64  BP Location Left Arm  BP Method Automatic  Patient Position (if appropriate) Lying  Pulse Rate (!) 134  Pulse Rate Source Monitor  Resp 18  Oxygen Therapy  SpO2 98 %  O2 Device Room Air  Pt's HR sustained in the 130's-140's, pt resting in bed. Dr, Johnna Acosta, cardiologist on call made aware, no new orders made. Will continue to monitor.

## 2017-03-19 NOTE — Consult Note (Signed)
Consultation Note Date: 03/19/2017   Patient Name: Jimmy Myers  DOB: 16-Jun-1932  MRN: [id number]  Age / Sex: 83 y.o., male  PCP: [name], MD Referring Physician: Thayer Headings, MD  Reason for Consultation: Establishing goals of care  HPI/Patient Profile: 83 y.o. male  with past medical history of cholangiocarcinoma (Dr. Irene Limbo), s/p biliary stent placement, s/p SBRT to CBD tumor (Dr. Lisbeth Renshaw), right PE, systolic heart failure EF 20%, atrial fibrillation, Waldenstrom's macroglobulinemia, thoracic aortic aneurysm admitted on 03/18/2017 with heart failure exacerbation and atrial fibrillation with associated SOB, weakness, abd swelling. Admitted for IV diuresis.   Clinical Assessment and Goals of Care: I have spoken with Dr. Irene Limbo for update. [name] other health issues have taken precedence over his cancer and limits his ability to have further treatment with palliative chemotherapy. There had been concern for early signs of liver mets. There was no indication for further treatment for cancer previously but even if there had been he really does not have many options given his poor functional status and progression of other disease processes (mostly cardiac).   I met today with [name] (unfortunately no family currently at bedside). He tells me that he actually feels worse today and "I know I'm sick." We discussed the severity of his heart failure, atrial fibrillation, and cancer. [name] seems to have some understanding of the severity of his diseases but does not seem to understand consequences of decisions or expectations. He frequently drifts asleep during our conversation which makes it difficult to progress the conversation. I have reached out to his wife and we will meet tomorrow to further discuss [place].   Primary Decision Maker NEXT OF KIN wife    SUMMARY OF RECOMMENDATIONS   - Will discuss more  with wife tomorrow  Code Status/Advance Care Planning:  Full code - Mr. Oldaker currently desires full code but unclear if he understands this decision   Symptom Management:   Optimize health and diuresis per cardiology.   Additional Recommendations (Limitations, Scope, Preferences):  Full Scope Treatment  Psycho-social/Spiritual:   Desire for further Chaplaincy support:no  Additional Recommendations: Caregiving  Support/Resources  Prognosis:   Prognosis very poor but dependent on the next couple days how he responds to interventions. I do feel he is still eligible for hospice with best case scenario d/t his cardiac conditions.   Discharge Planning: To Be Determined      Primary Diagnoses: Present on Admission: . Acute systolic heart failure ([place])   I have reviewed the medical record, interviewed the patient and family, and examined the patient. The following aspects are pertinent.  Past Medical History:  Diagnosis Date  . A-fib [facility]) May 2013   s/p TEE/cardioversion June 2013  . Anxiety   . Aortic root enlargement (HCC)    at least 5cm by CT  . Arthritis   . Atrial thrombus   . Bradycardia   . Bronchitis   . Cancer ([place]) 2016   lymphoma  . Chronic anticoagulation    on  coumadin - checked in [place]  . Chronic systolic heart failure ([place]) 12/28/2011  . Congestive heart failure (CHF) ([place]) 2013  . Diverticulosis   . Dysrhythmia    AFib  . GERD (gastroesophageal reflux disease)   . Hemorrhoids   . HTN (hypertension)   . PE (pulmonary thromboembolism) ([place]) 01/2017  . Pneumonia May 2013  . Pneumonia 2013  . 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 Thrombocytopenia [facility])    Social History   Social History  . Marital status: Married    Spouse name: N/A  . Number of children: 2  . Years of education: N/A   Occupational  History  . retired Retired   Social History Main Topics  . Smoking status: Never Smoker  . Smokeless tobacco: Never Used  . Alcohol use No  . Drug use: No  . Sexual activity: Not Currently    Birth control/ protection: None   Other Topics Concern  . None   Social History Narrative  . None   Family History  Problem Relation Age of Onset  . Heart disease Mother   . Heart disease Father   . Stomach cancer Brother   . Heart disease Sister   . Heart disease Brother   . Hypertension Sister    Scheduled Meds: . apixaban  5 mg Oral BID  . metoprolol succinate  75 mg Oral Daily  . pantoprazole  40 mg Oral Daily  . polyethylene glycol  17 g Oral QHS  . potassium chloride  20 mEq Oral Daily  . sodium chloride flush  3 mL Intravenous Q12H   Continuous Infusions: . sodium chloride    . amiodarone 60 mg/hr (03/19/17 1154)   Followed by  . amiodarone    . furosemide Stopped (03/19/17 0745)   PRN Meds:.sodium chloride, acetaminophen, clorazepate, ondansetron (ZOFRAN) IV, polyethylene glycol, sodium chloride flush Allergies  Allergen Reactions  . Codeine Other (See Comments)    nervousness  . Simvastatin Other (See Comments)    Muscle pain/hurting  . Vicodin [Hydrocodone-Acetaminophen] Other (See Comments)    nervousness   Review of Systems  Constitutional: Positive for activity change, appetite change and fatigue.  Respiratory: Positive for shortness of breath.   Neurological: Positive for weakness.    Physical Exam  Nursing note and vitals reviewed. Constitutional: He appears well-developed. He appears lethargic. He has a sickly appearance.  HENT:  Head: Normocephalic and atraumatic.  Cardiovascular: Normal rate.  An irregularly irregular rhythm present.  Pulmonary/Chest: Breath sounds normal. No accessory muscle usage. No tachypnea. He is in respiratory distress.  Resp distress more mild today  Abdominal: He exhibits distension.  Neurological: He appears lethargic.   Mostly oriented and aware but limited d/t ability to maintain alertness    Vital Signs: BP 109/61 (BP Location: Left Arm)   Pulse (!) 111   Temp 98.4 F (36.9 C) (Oral)   Resp 18   Ht _0  (1.778 m)   Wt 93.1 kg (205 lb 4.8 oz)   SpO2 97%   BMI 29.46 kg/m  Pain Assessment: No/denies pain   Pain Score: 0-No pain   SpO2: SpO2: 97 % O2 Device:SpO2: 97 % O2 Flow Rate: .   IO: Intake/output summary:  Intake/Output Summary (Last 24 hours) at 03/19/17 1400 Last data filed at 03/19/17 1300  Gross per 24 hour  Intake             1002 ml  Output             1825 ml  Net             -823 ml    LBM: Last BM Date: 03/19/17 Baseline Weight: Weight: 95.4 kg (210 lb 6.4 oz) (scale b) Most recent weight: Weight: 93.1 kg (205 lb 4.8 oz)     Palliative Assessment/Data:      Time Total: 52mn  Greater than 50%  of this time was spent counseling and coordinating care related to the above assessment and plan.  Signed by: AVinie Sill NP Palliative Medicine Team Pager # [phone](M-F 8a-5p) Team Phone # [phone](Nights/Weekends)

## 2017-03-19 NOTE — Progress Notes (Signed)
Rn makes patient aware of the importance of safety alarm, pt refused bed alarm this time, will continue to monitor

## 2017-03-19 NOTE — Progress Notes (Signed)
Progress Note  Patient Name: Jimmy Myers Date of Encounter: 03/19/2017  Primary Cardiologist: Allred/  Subjective   He believes dyspnea is worse today. Orthopneic. Ventricular rate 130-140s at rest. - 800 mL net diuresis after admission yesterday evening. BP is low. Denies angina.  Inpatient Medications    Scheduled Meds: . apixaban  5 mg Oral BID  . metoprolol succinate  75 mg Oral Daily  . pantoprazole  40 mg Oral Daily  . polyethylene glycol  17 g Oral QHS  . potassium chloride  20 mEq Oral Daily  . sodium chloride flush  3 mL Intravenous Q12H   Continuous Infusions: . sodium chloride    . furosemide Stopped (03/19/17 0745)   PRN Meds: sodium chloride, acetaminophen, clorazepate, ondansetron (ZOFRAN) IV, polyethylene glycol, sodium chloride flush   Vital Signs    Vitals:   03/18/17 2037 03/18/17 2337 03/19/17 0031 03/19/17 0651  BP: 99/68 105/64 96/74 93/69   Pulse: (!) 116 (!) 134 (!) 114 (!) 131  Resp: 20 18 18 18   Temp: 98.1 F (36.7 C)  98.6 F (37 C) 98 F (36.7 C)  TempSrc: Oral  Oral Oral  SpO2: 96% 98% 93% 97%  Weight:    205 lb 4.8 oz (93.1 kg)  Height:        Intake/Output Summary (Last 24 hours) at 03/19/17 1127 Last data filed at 03/19/17 0828  Gross per 24 hour  Intake              782 ml  Output             1625 ml  Net             -843 ml   Filed Weights   03/18/17 1715 03/19/17 0651  Weight: 210 lb 6.4 oz (95.4 kg) 205 lb 4.8 oz (93.1 kg)    Telemetry    Afib w RVR - Personally Reviewed  ECG    AFib w RVR - Personally Reviewed  Physical Exam  Tachypneic GEN: No acute distress.   Neck: 6-8 cm JVD Cardiac: irregular, very rapid rate, no murmurs, rubs, or gallops. 2+ pitting edema, a little more prominent in L calf Respiratory: Clear to auscultation bilaterally. GI: Soft, nontender, mildly distended  MS: No edema; No deformity. Neuro:  Nonfocal  Psych: Normal affect   Labs    Chemistry Recent Labs Lab 03/18/17 1830  03/19/17 0356  NA 140 141  K 4.3 4.0  CL 103 105  CO2 29 27  GLUCOSE 147* 119*  BUN 44* 47*  CREATININE 1.75* 1.79*  CALCIUM 8.9 8.7*  PROT 5.8*  --   ALBUMIN 3.3*  --   AST 58*  --   ALT 68*  --   ALKPHOS 214*  --   BILITOT 1.5*  --   GFRNONAA 35* 34*  GFRAA 40* 39*  ANIONGAP 8 9     Hematology Recent Labs Lab 03/18/17 1830  WBC 6.3  RBC 3.86*  HGB 11.8*  HCT 37.4*  MCV 96.9  MCH 30.6  MCHC 31.6  RDW 14.8  PLT 137*    Cardiac EnzymesNo results for input(s): TROPONINI in the last 168 hours. No results for input(s): TROPIPOC in the last 168 hours.   BNP Recent Labs Lab 03/18/17 1830  BNP 1,928.8*     DDimer No results for input(s): DDIMER in the last 168 hours.   Radiology    No results found.  Cardiac Studies   ECHO 02/19/2017 - Left ventricle:  The cavity size was severely dilated. Systolic   function was severely reduced. The estimated ejection fraction   was in the range of 20% to 25%. Severe diffuse hypokinesis with   regional variations. The study was not technically sufficient to   allow evaluation of LV diastolic dysfunction due to atrial   fibrillation. - Aortic valve: Moderately to severely calcified annulus.   Trileaflet; mildly thickened, moderately calcified leaflets.   Valve area (VTI): 1.92 cm^2. Valve area (Vmax): 1.77 cm^2. Valve   area (Vmean): 2.15 cm^2. - Aorta: Aortic root dimension: 50 mm (ED). - Aortic root: The aortic root was severely dilated. - Left atrium: The atrium was moderately to severely dilated. - Right ventricle: The cavity size was mildly dilated. Wall   thickness was normal. - Pulmonary arteries: PA peak pressure: 34 mm Hg (S).  Patient Profile     83 y.o. male with recurrent persistent atrial fibrillation and previous systolic HF (improved LVEF, now again severely depressed), recent pulmonary embolism, cholangiocarcinoma and Waldenstrom's macroglobulinemia, CKD, presents with severe RVR and HF  decompensation.  Assessment & Plan    1. AFib: persistent, very hard to rate control and associated with tachycardia cardiomyopathy. Unable to increase conventional AV blocking agents due to BP. Add amiodarone. Acknowledge problems with bradycardia in the past (tachy-brady sd.), will stop beta blocker if he converts and has bradycardia. Pacemaker may be necessary. Would like to avoid such an invasive procedure if his prognosis is poor from neoplasm. Consider DCCV if we cannot achieve rate control, but better odds for maintenance of SR if he is "loaded" with amio. On Eliquis. 2. CHF:  EF 20%, presumed tachycardia related, but he has evident atherosclerosis in the coronary tree on CT. He is a Visual merchandiser candidate for revascularization and I do not think we will pursue the CAD diagnosis at this time. BP limits HF meds. 3. Recent PE: on Eliquis. 4. Extrahepatic cholangiocarcinoma with biliary obstruction and cholangitis s/p stent and SBRT. I would assume poor poor prognosis, but he insists on full code status. Prognosis not clearly addressed in last Oncology note. Palliative care consultation is appropriate, but I do not think he really understands how sick he is. Will try to build a little more rapport and education before suggesting palliative care eval. 5. Waldenstrom's macroglobulinemia:  May be promoting hypercoagulability as well. 6. Acute on CKD: recent worsening of renal function is probably related to CHF, but may also have some nephrotoxicity form CT w contrast  X 2 performed on 03/10/2017. Monitor w diuresis.   Signed, Sanda Klein, MD  03/19/2017, 11:27 AM

## 2017-03-19 NOTE — Progress Notes (Signed)
Marland Kitchen    HEMATOLOGY/ONCOLOGY CLINIC NOTE  Date of Service: .03/17/2017  Patient Care Team: [name], MD as PCP - General (Family Medicine) Thompson Grayer, MD as Attending Physician (Cardiology) Donita Brooks, MD as Consulting Physician (Internal Medicine) [name], MD as Attending Physician (Family Medicine)  CHIEF COMPLAINTS/PURPOSE OF CONSULTATION:  Newly diagnosed cholangiocarcinoma  HISTORY OF PRESENTING ILLNESS:   Plz see previous note for details on initial presentation  INTERVAL HISTORY  Patient is here for a follow-up for his cholangiocarcinoma and Walden Strom's macroglobulinemia. He completed SBRT by Dr. Lisbeth Renshaw to the CBD duct tumor and tolerated this well.  No current abdominal discomfort. No jaundice. His restaging CT scan of the chest abdomen pelvis done on 03/10/2017 showed some very minute foci of enhancement in the liver which are nonspecific but cannot rule out early metastases. Slight reduction in size of the previously noted left lower lobe nodule. Soft tissue density around the CBD stent measuring 3.1 x 2.1 cm. No other overt metastatic disease.  Patient was recently admitted for atrial fibrillation and heart failure related issues and notes that he is still having some issues with shortness of breath and has not bounced back completely. Also had a right pulmonary embolism and is on anticoagulation. We discussed that given his current overall ECOG performance status of 3 and multiple other medical comorbidities that are affecting his quality of life there is no clear indication at this time of palliative chemotherapy.  We discussed and he is agreeable to interval follow-up with repeat imaging in 2 months. We discussed that at that time his performance status and the status of the disease will determine consideration of palliative chemotherapy.  He has mild cytopenias from his Waldenstrm's macroglobulinemia that does not warrant treatment at  this time.  No fevers no chills no night sweats. MEDICAL HISTORY:  Past Medical History:  Diagnosis Date  . A-fib [facility]) May 2013   s/p TEE/cardioversion June 2013  . Anxiety   . Aortic root enlargement (HCC)    at least 5cm by CT  . Arthritis   . Atrial thrombus   . Bradycardia   . Bronchitis   . Cancer ([place]) 2016   lymphoma  . Chronic anticoagulation    on coumadin - checked in [place]  . Chronic systolic heart failure ([place]) 12/28/2011  . Congestive heart failure (CHF) ([place]) 2013  . Diverticulosis   . Dysrhythmia    AFib  . GERD (gastroesophageal reflux disease)   . Hemorrhoids   . HTN (hypertension)   . PE (pulmonary thromboembolism) ([place]) 01/2017  . Pneumonia May 2013  . Pneumonia 2013  . 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 Thrombocytopenia ([place])     SURGICAL HISTORY: Past Surgical History:  Procedure Laterality Date  . CARDIOVERSION  12/30/2011   Procedure: CARDIOVERSION;  Surgeon: Lelon Perla, MD;  Location: [facility] ENDOSCOPY;  Service: Cardiovascular;  Laterality: N/A;  . CARDIOVERSION  02/16/2012   Procedure: CARDIOVERSION;  Surgeon: Lelon Perla, MD;  Location: [facility] ENDOSCOPY;  Service: Cardiovascular;  Laterality: N/A;  . CATARACT EXTRACTION W/PHACO Left 07/28/2016   Procedure: CATARACT EXTRACTION PHACO AND INTRAOCULAR LENS PLACEMENT LEFT EYE:  CDE: 13.00;  Surgeon: Tonny Branch, MD;  Location: [facility] ORS;  Service: Ophthalmology;  Laterality: Left;  . ENDOSCOPIC RETROGRADE CHOLANGIOPANCREATOGRAPHY (ERCP) WITH PROPOFOL N/A 10/24/2016  Procedure: ENDOSCOPIC RETROGRADE CHOLANGIOPANCREATOGRAPHY (ERCP) WITH PROPOFOL;  Surgeon: Doran Stabler, MD;  Location: [facility] ENDOSCOPY;  Service: Endoscopy;  Laterality: N/A;  . ESOPHAGOGASTRODUODENOSCOPY  12/30/2011   Procedure: ESOPHAGOGASTRODUODENOSCOPY (EGD);  Surgeon: Lelon Perla, MD;   Location: [facility] ENDOSCOPY;  Service: Cardiovascular;  Laterality: N/A;  . ESOPHAGOGASTRODUODENOSCOPY  12/30/2011   Procedure: ESOPHAGOGASTRODUODENOSCOPY (EGD);  Surgeon: Lafayette Dragon, MD;  Location: [facility] ENDOSCOPY;  Service: Endoscopy;  Laterality: N/A;  . IR BILIARY STENT(S) EXISTING ACCESS INC DILATION CATH EXCHANGE  12/16/2016  . IR GENERIC HISTORICAL  10/25/2016   IR INT EXT BILIARY DRAIN WITH CHOLANGIOGRAM 10/25/2016 Greggory Keen, MD [facility]-INTERV RAD  . IR GENERIC HISTORICAL  10/28/2016   IR EXCHANGE BILIARY DRAIN 10/28/2016 Greggory Keen, MD [facility]INTER[facility]  . IR GENERIC HISTORICAL  10/28/2016   IR ENDOLUMINAL BX OF BILIARY TREE 10/28/2016 Greggory Keen, MD [facility]INTER[facility]  . IR GENERIC HISTORICAL  11/21/2016   IR EXCHANGE BILIARY DRAIN 11/21/2016 Corrie Mckusick, DO [facility]-INTERV RAD  . IR RADIOLOGIST EVAL & MGMT  12/03/2016  . IR RADIOLOGIST EVAL & MGMT  11/11/2016  . IR REMOVAL BILIARY DRAIN  12/30/2016  . KNEE ARTHROSCOPY  rt knee  . PROSTATE SURGERY    . TEE WITHOUT CARDIOVERSION  12/30/2011   Procedure: TRANSESOPHAGEAL ECHOCARDIOGRAM (TEE);  Surgeon: Lelon Perla, MD;  Location: [facility] ENDOSCOPY;  Service: Cardiovascular;  Laterality: N/A;  . TEE WITHOUT CARDIOVERSION  02/16/2012   Procedure: TRANSESOPHAGEAL ECHOCARDIOGRAM (TEE);  Surgeon: Lelon Perla, MD;  Location: [facility] ENDOSCOPY;  Service: Cardiovascular;  Laterality: N/A;    SOCIAL HISTORY: Social History   Social History  . Marital status: Married    Spouse name: N/A  . Number of children: 2  . Years of education: N/A   Occupational History  . retired Retired   Social History Main Topics  . Smoking status: Never Smoker  . Smokeless tobacco: Never Used  . Alcohol use No  . Drug use: No  . Sexual activity: Not Currently    Birth control/ protection: None   Other Topics Concern  . Not on file   Social History Narrative  . No narrative on file    FAMILY HISTORY: Family History  Problem Relation Age of Onset  . Heart disease Mother    . Heart disease Father   . Stomach cancer Brother   . Heart disease Sister   . Heart disease Brother   . Hypertension Sister     ALLERGIES:  is allergic to codeine; simvastatin; and vicodin [hydrocodone-acetaminophen].  MEDICATIONS:  No current facility-administered medications for this visit.    No current outpatient prescriptions on file.   Facility-Administered Medications Ordered in Other Visits  Medication Dose Route Frequency Provider Last Rate Last Dose  . 0.9 %  sodium chloride infusion  250 mL Intravenous PRN Truitt Merle C, NP      . acetaminophen (TYLENOL) tablet 650 mg  650 mg Oral Q4H PRN Burtis Junes, NP      . apixaban (ELIQUIS) tablet 5 mg  5 mg Oral BID Truitt Merle C, NP   5 mg at 03/18/17 2120  . clorazepate (TRANXENE) tablet 3.75 mg  3.75 mg Oral QHS PRN Truitt Merle C, NP   3.75 mg at 03/18/17 2238  . furosemide (LASIX) 120 mg in dextrose 5 % 50 mL IVPB  120 mg Intravenous Q12H Truitt Merle C, NP 62 mL/hr at 03/19/17 0640 120 mg at 03/19/17 0640  . metoprolol succinate (TOPROL-XL) 24 hr tablet  75 mg  75 mg Oral Daily Truitt Merle C, NP   75 mg at 03/18/17 1757  . ondansetron (ZOFRAN) injection 4 mg  4 mg Intravenous Q6H PRN Truitt Merle C, NP      . pantoprazole (PROTONIX) EC tablet 40 mg  40 mg Oral Daily Truitt Merle C, NP   40 mg at 03/18/17 1757  . polyethylene glycol (MIRALAX / GLYCOLAX) packet 17 g  17 g Oral Daily PRN Truitt Merle C, NP      . polyethylene glycol (MIRALAX / GLYCOLAX) packet 17 g  17 g Oral QHS Nahser, Wonda Cheng, MD   17 g at 03/18/17 2239  . potassium chloride SA (K-DUR,KLOR-CON) CR tablet 20 mEq  20 mEq Oral Daily Truitt Merle C, NP   20 mEq at 03/18/17 1757  . sodium chloride flush (NS) 0.9 % injection 3 mL  3 mL Intravenous Q12H Truitt Merle C, NP   3 mL at 03/18/17 2121  . sodium chloride flush (NS) 0.9 % injection 3 mL  3 mL Intravenous PRN Burtis Junes, NP        REVIEW OF SYSTEMS:    10 Point review of  Systems was done is negative except as noted above.  PHYSICAL EXAMINATION: ECOG PERFORMANCE STATUS: 2 - Symptomatic, <50% confined to bed  . Vitals:   03/17/17 1020  BP: 110/70  Pulse: 93  Resp: 20  Temp: 98 F (36.7 C)   Filed Weights   03/17/17 1020  Weight: 211 lb 12.8 oz (96.1 kg)   .Body mass index is 30.39 kg/m.  GENERAL:alert, in no acute distress and comfortable SKIN: no acute rashes, no significant lesions EYES: conjunctiva are pink and non-injected, sclera anicteric OROPHARYNX: MMM, no exudates, no oropharyngeal erythema or ulceration NECK: supple, no JVD LYMPH:  no palpable lymphadenopathy in the cervical, axillary or inguinal regions LUNGS: clear to auscultation b/l with normal respiratory effort HEART: Irregular ABDOMEN:  normoactive bowel sounds , non tender, not distended. Extremity: Trace pedal edema PSYCH: alert & oriented x 3 with fluent speech NEURO: no focal motor/sensory deficits  LABORATORY DATA:  I have reviewed the data as listed  . CBC Latest Ref Rng & Units 03/18/2017 03/05/2017 03/02/2017  WBC 4.0 - 10.5 K/uL 6.3 5.0 6.5  Hemoglobin 13.0 - 17.0 g/dL 11.8(L) 12.3(L) 12.2(L)  Hematocrit 39.0 - 52.0 % 37.4(L) 38.8 37.2(L)  Platelets 150 - 400 K/uL 137(L) 94(L) 116(L)   . CMP Latest Ref Rng & Units 03/18/2017 03/05/2017  Glucose 65 - 99 mg/dL 147(H) 109  BUN 6 - 20 mg/dL 44(H) 32.6(H)  Creatinine 0.61 - 1.24 mg/dL 1.75(H) 1.3  Sodium 135 - 145 mmol/L 140 144  Potassium 3.5 - 5.1 mmol/L 4.3 4.0  Chloride 101 - 111 mmol/L 103 -  CO2 22 - 32 mmol/L 29 29  Calcium 8.9 - 10.3 mg/dL 8.9 9.1  Total Protein 6.5 - 8.1 g/dL 5.8(L) 6.1(L)  Total Bilirubin 0.3 - 1.2 mg/dL 1.5(H) 0.92  Alkaline Phos 38 - 126 U/L 214(H) 186(H)  AST 15 - 41 U/L 58(H) 32  ALT 17 - 63 U/L 68(H) 45       .Dg Chest 2 View  Result Date: 02/23/2017 CLINICAL DATA:  83 year old male with shortness of Breath and weakness. EXAM: CHEST  2 VIEW COMPARISON:  02/21/2017 and  earlier. FINDINGS: Small bilateral pleural effusions persist and may be mildly increased since 02/19/2017. Decreased pulmonary vascularity since that time. No overt edema. No pneumothorax. Lung base atelectasis. No consolidation.  Stable cardiomegaly and mediastinal contours. Calcified aortic atherosclerosis. Visualized tracheal air column is within normal limits. No acute osseous abnormality identified. Negative visible bowel gas pattern. IMPRESSION: 1. Resolved pulmonary edema but continued and perhaps slightly increased bilateral pleural effusions since 02/19/2017. 2. Cardiomegaly.  Calcified aortic atherosclerosis. Electronically Signed   By: Genevie Ann M.D.   On: 02/23/2017 08:27   Dg Chest 2 View  Result Date: 02/19/2017 CLINICAL DATA:  Shortness of breath. Acute pulmonary embolism. History of atrial fibrillation, chronic CHF, cholangiocarcinoma. EXAM: CHEST  2 VIEW COMPARISON:  Chest x-ray and chest CT scan of February 17, 2017 FINDINGS: The lungs are well-expanded. The interstitial markings are more conspicuous today. The pulmonary vascularity remains engorged. The cardiac silhouette remains enlarged. There small bilateral pleural effusions which are stable. There is calcification in the wall of the thoracic aorta. There is multilevel degenerative disc disease of the thoracic spine. IMPRESSION: CHF with mild interstitial edema and small bilateral pleural effusions. No acute pneumonia. Thoracic aortic atherosclerosis. Electronically Signed   By: David  Martinique M.D.   On: 02/19/2017 08:58   Dg Chest 2 View  Result Date: 02/17/2017 CLINICAL DATA:  3-4 days of shortness of breath. No chest pain. History of CHF, atrial fibrillation. EXAM: CHEST  2 VIEW COMPARISON:  Chest CT scan of October 31, 2016 and chest x-ray of Jan 06, 2012. FINDINGS: The lungs remain mildly hyperinflated. There are small bilateral pleural effusions layering posteriorly which are slightly more conspicuous than on the previous study. The cardiac  silhouette is enlarged. The pulmonary vascularity is engorged. There is calcification in the wall of the thoracic aorta. The bony thorax exhibits no acute abnormality. IMPRESSION: Hyperinflation consistent with chronic bronchitis or reactive airway disease, stable. Cardiomegaly, pulmonary vascular congestion, and mild pulmonary interstitial edema consistent with CHF. Small bilateral pleural effusions more conspicuous than on the previous study. Thoracic aortic atherosclerosis. Electronically Signed   By: David  Martinique M.D.   On: 02/17/2017 13:14   Ct Chest W Contrast  Result Date: 03/10/2017 CLINICAL DATA:  Cholangiocarcinoma followup.  Lymphoma. EXAM: CT CHEST, ABDOMEN, AND PELVIS WITH CONTRAST TECHNIQUE: Multidetector CT imaging of the chest, abdomen and pelvis was performed following the standard protocol during bolus administration of intravenous contrast. CONTRAST:  146mL ISOVUE-300 IOPAMIDOL (ISOVUE-300) INJECTION 61% COMPARISON:  Multiple exams, including 02/17/2017 and 12/03/2016 FINDINGS: CT CHEST FINDINGS Cardiovascular: Coronary, aortic arch, and branch vessel atherosclerotic vascular disease. Moderate cardiomegaly. Previous pulmonary thrombus no longer visible although today' s exam was not performed as a pulmonary embolus CT angiogram. Mediastinum/Nodes: Partially solid left thyroid nodule 5.2 by 3.6 cm on image 15/2, similar to prior. Left lower paratracheal node 0.8 cm in short axis on image 31/2, stable. No pathologic adenopathy in the chest. Lungs/Pleura: Moderate bilateral pleural effusions with associated passive atelectasis. No definite mass like appearance or abnormal enhancement along the pleural surfaces. Airway thickening is present, suggesting bronchitis or reactive airways disease. A triangular nodule in the left lower lobe measures 1.0 by 0.5 cm on image 146/6, formerly 1.2 by 0.7 cm. Musculoskeletal: Thoracic spondylosis. CT ABDOMEN PELVIS FINDINGS Hepatobiliary: There several foci of  indistinct arterial phase enhancement in segment 7 of the liver including a 1.6 by 0.9 cm lesion on image 59/2; a 0.9 by 0.7 cm lesion also on image 59; and a 10 mm lesion on image 56/2. There is some other faint areas of arterial phase heterogeneity in the liver. Pneumobilia. There is a expandable stent in the common bile duct with surrounding wall  thickening and indistinct margins concerning for tumor in this context. The surrounding soft tissue density measures up to 3.1 by 2.1 cm on image 73/2, although some of this appearance could be inflammatory. No current biliary dilatation. There is some minimal gallbladder wall thickening along with several suspected small gallstones. Pancreas: No dorsal pancreatic duct dilatation. Pancreas unremarkable. Spleen: Unremarkable Adrenals/Urinary Tract: Right kidney upper pole exophytic fluid density cyst. Left mid kidney 1.4 cm hypodense lesion is stable and likely a benign cyst, although slightly blurred and difficult to make reliable density measurements within. Adrenal glands unremarkable. Stomach/Bowel: Unremarkable Vascular/Lymphatic: Aortoiliac atherosclerotic vascular disease. No pathologic adenopathy is identified. Reproductive: Low-density along the upper prostate, query prior transurethral resection of the prostate. Otherwise unremarkable. Other: Trace perihepatic ascites extending along the right paracolic gutter. Mild presacral edema, cause and significance uncertain. Musculoskeletal: Lumbar spondylosis and degenerative disc disease causing impingement most notably at L4-5 but also at L3-4 and L5-S1. Transitional S1 vertebra. Bridging spurring of both sacroiliac joints. Degenerative arthropathy of both hips. IMPRESSION: 1. An expandable stent spans the region of the mass along the common bile duct. There is soft tissue density in this vicinity surrounding the stent, total size 3.1 by 2.1 cm, although some of this could conceivably be inflammatory. Portal vein  remains patent. 2. Several foci of arterial phase enhancement in segment 7 of the liver are nonspecific. These could be due to transient hepatic attenuation difference but I cannot exclude metastatic disease. Surveillance likely warranted. 3. Trace perihepatic ascites tracking down in the right paracolic gutter. 4. Slight reduction in size of a triangular left lower lobe pulmonary nodule, currently 1.0 by 0.5 cm, merits surveillance. 5. Other imaging findings of potential clinical significance: Aortic Atherosclerosis (ICD10-I70.0). Coronary atherosclerosis. Moderate cardiomegaly. Stable large partially solid left thyroid nodule. Moderate bilateral pleural effusions with passive atelectasis. Airway thickening is present, suggesting bronchitis or reactive airways disease. 6. Bilateral renal cysts. 7. Lower lumbar spondylosis and degenerative disc disease causing multilevel impingement. Electronically Signed   By: Van Clines M.D.   On: 03/10/2017 12:08   Ct Angio Chest Pe W/cm &/or Wo Cm  Result Date: 02/17/2017 CLINICAL DATA:  Shortness of breath for 4 days, atrial fibrillation, no chest pain; history systolic CHF, hypertension EXAM: CT ANGIOGRAPHY CHEST WITH CONTRAST TECHNIQUE: Multidetector CT imaging of the chest was performed using the standard protocol during bolus administration of intravenous contrast. Multiplanar CT image reconstructions and MIPs were obtained to evaluate the vascular anatomy. CONTRAST:  80 cc Isovue 370 IV COMPARISON:  None. FINDINGS: Cardiovascular: Atherosclerotic calcifications aorta and coronary arteries. Aneurysmal dilatation ascending thoracic aorta 4.8 cm transverse image 78 unchanged. No pericardial effusion. Dilatation of LEFT ventricle. Pulmonary arteries well opacified. Few scattered respiratory motion artifacts. Small filling defect identified within a posterior RIGHT lower lobe pulmonary artery compatible pulmonary embolism. No additional pulmonary emboli identified.  No RIGHT ventricular dilatation. Mediastinum/Nodes: Large LEFT thyroid mass again identified 5.3 x 4.2 cm, measured 5.7 x 3.6 cm on 09/17/2015. No thoracic adenopathy. Esophagus unremarkable. Lungs/Pleura: Small BILATERAL pleural effusions with compressive atelectasis of the lower lobes. Central peribronchial thickening. No definite infiltrate or pneumothorax. Upper Abdomen: Pneumobilia unchanged. Remaining visualized upper abdomen unremarkable. Musculoskeletal: Diffuse osseous demineralization. Scattered degenerative disc disease changes thoracic spine. Review of the MIP images confirms the above findings. IMPRESSION: Small pulmonary embolus in a RIGHT lower lobe pulmonary artery with small BILATERAL pleural effusions with compressive atelectasis of the posterior lower lobes. Stable LEFT thyroid mass. Coronary arterial calcification. Aneurysmal dilatation ascending thoracic  aorta 4.8 cm transverse unchanged, recommendation below. Ascending thoracic aortic aneurysm. Recommend semi-annual imaging followup by CTA or MRA and referral to cardiothoracic surgery if not already obtained. This recommendation follows 2010 ACCF/AHA/AATS/ACR/ASA/SCA/SCAI/SIR/STS/SVM Guidelines for the Diagnosis and Management of Patients With Thoracic Aortic Disease. Circulation. 2010; 121: Z610-R604 Aortic Atherosclerosis (ICD10-I70.0). Aortic aneurysm NOS (ICD10-I71.9). Critical Value/emergent results were called by telephone at the time of interpretation on 02/17/2017 at 2:29 pm to Dr. Quintella Reichert , who verbally acknowledged these results. Electronically Signed   By: Lavonia Dana M.D.   On: 02/17/2017 14:29   Ct Abdomen Pelvis W Contrast  Result Date: 03/10/2017 CLINICAL DATA:  Cholangiocarcinoma followup.  Lymphoma. EXAM: CT CHEST, ABDOMEN, AND PELVIS WITH CONTRAST TECHNIQUE: Multidetector CT imaging of the chest, abdomen and pelvis was performed following the standard protocol during bolus administration of intravenous contrast.  CONTRAST:  133mL ISOVUE-300 IOPAMIDOL (ISOVUE-300) INJECTION 61% COMPARISON:  Multiple exams, including 02/17/2017 and 12/03/2016 FINDINGS: CT CHEST FINDINGS Cardiovascular: Coronary, aortic arch, and branch vessel atherosclerotic vascular disease. Moderate cardiomegaly. Previous pulmonary thrombus no longer visible although today' s exam was not performed as a pulmonary embolus CT angiogram. Mediastinum/Nodes: Partially solid left thyroid nodule 5.2 by 3.6 cm on image 15/2, similar to prior. Left lower paratracheal node 0.8 cm in short axis on image 31/2, stable. No pathologic adenopathy in the chest. Lungs/Pleura: Moderate bilateral pleural effusions with associated passive atelectasis. No definite mass like appearance or abnormal enhancement along the pleural surfaces. Airway thickening is present, suggesting bronchitis or reactive airways disease. A triangular nodule in the left lower lobe measures 1.0 by 0.5 cm on image 146/6, formerly 1.2 by 0.7 cm. Musculoskeletal: Thoracic spondylosis. CT ABDOMEN PELVIS FINDINGS Hepatobiliary: There several foci of indistinct arterial phase enhancement in segment 7 of the liver including a 1.6 by 0.9 cm lesion on image 59/2; a 0.9 by 0.7 cm lesion also on image 59; and a 10 mm lesion on image 56/2. There is some other faint areas of arterial phase heterogeneity in the liver. Pneumobilia. There is a expandable stent in the common bile duct with surrounding wall thickening and indistinct margins concerning for tumor in this context. The surrounding soft tissue density measures up to 3.1 by 2.1 cm on image 73/2, although some of this appearance could be inflammatory. No current biliary dilatation. There is some minimal gallbladder wall thickening along with several suspected small gallstones. Pancreas: No dorsal pancreatic duct dilatation. Pancreas unremarkable. Spleen: Unremarkable Adrenals/Urinary Tract: Right kidney upper pole exophytic fluid density cyst. Left mid kidney  1.4 cm hypodense lesion is stable and likely a benign cyst, although slightly blurred and difficult to make reliable density measurements within. Adrenal glands unremarkable. Stomach/Bowel: Unremarkable Vascular/Lymphatic: Aortoiliac atherosclerotic vascular disease. No pathologic adenopathy is identified. Reproductive: Low-density along the upper prostate, query prior transurethral resection of the prostate. Otherwise unremarkable. Other: Trace perihepatic ascites extending along the right paracolic gutter. Mild presacral edema, cause and significance uncertain. Musculoskeletal: Lumbar spondylosis and degenerative disc disease causing impingement most notably at L4-5 but also at L3-4 and L5-S1. Transitional S1 vertebra. Bridging spurring of both sacroiliac joints. Degenerative arthropathy of both hips. IMPRESSION: 1. An expandable stent spans the region of the mass along the common bile duct. There is soft tissue density in this vicinity surrounding the stent, total size 3.1 by 2.1 cm, although some of this could conceivably be inflammatory. Portal vein remains patent. 2. Several foci of arterial phase enhancement in segment 7 of the liver are nonspecific. These could be  due to transient hepatic attenuation difference but I cannot exclude metastatic disease. Surveillance likely warranted. 3. Trace perihepatic ascites tracking down in the right paracolic gutter. 4. Slight reduction in size of a triangular left lower lobe pulmonary nodule, currently 1.0 by 0.5 cm, merits surveillance. 5. Other imaging findings of potential clinical significance: Aortic Atherosclerosis (ICD10-I70.0). Coronary atherosclerosis. Moderate cardiomegaly. Stable large partially solid left thyroid nodule. Moderate bilateral pleural effusions with passive atelectasis. Airway thickening is present, suggesting bronchitis or reactive airways disease. 6. Bilateral renal cysts. 7. Lower lumbar spondylosis and degenerative disc disease causing  multilevel impingement. Electronically Signed   By: Van Clines M.D.   On: 03/10/2017 12:08   Dg Chest Port 1 View  Result Date: 02/21/2017 CLINICAL DATA:  Congestive heart failure. Acute respiratory failure. EXAM: PORTABLE CHEST 1 VIEW COMPARISON:  02/19/2017 and 02/17/2017 FINDINGS: There is cardiomegaly with tortuosity and calcification of the thoracic aorta. Small right pleural effusion appears slightly more prominent. Increased density at the lung bases posterior medially probably represents pleural effusions. The pulmonary vascularity is now normal. IMPRESSION: Bilateral pleural effusions, small but slightly increased on the right. Resolved pulmonary vascular congestion. Aortic atherosclerosis. Cardiomegaly. Electronically Signed   By: Lorriane Shire M.D.   On: 02/21/2017 10:33      RADIOGRAPHIC STUDIES: I have personally reviewed the radiological images as listed and agreed with the findings in the report. 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 Patient is status post stent placement.  #2 abnormal liver function tests likely related to mid CBD malignant obstruction from newly diagnosed cholangiocarcinoma. Have significantly improved since stenting No alkaline phosphatase and transaminases are slightly elevated. No overt severe obstruction on recent CT scan. Did have stage of decompensation which could cause some congestive hepatopathy. CT scan does show some small large enhancing lesions which are too small to characterize but potentially could represent early metastatic disease. Tumor marker levels for CA-19-9 are relatively flat. -Patient has now completed SBRT for  is extrahepatic cholangiocarcinoma as per Dr Lisbeth Renshaw about 6-7 weeks ago. He seems to have tolerated the treatment without significant toxicities. Plan -CT chest abdomen pelvis results were discussed in  details with the patient. -No clear overt evidence of metastatic progression at this time or of local progression. Also the patient's performance status with ECOG PS of 3 precludes any consideration of palliative chemotherapy currently and likely in the future. -We will reassess his disease status in 2 months with repeat CT chest abdomen pelvis without contrast due to his kidney function. -Would recommend repeat LFTs with primary care physician in a month. -Continue follow-up with primary care physician and cardiology to optimize his atrial fibrillation and CHF management. -he continues on anticoagulation for his pulmonary embolism-he is on Eliquis.  #2 h/o Waldenstrom's macroglobulinemia/LPL -- s/p treatment at [facility] in [place] Dr Elyn Aquas in 2016  No significant cytopenias with only minimal anemia normal WBC count and platelet count 137k M spike of 0.5 g/dL IgM lambda routine. Plan No indication for additional treatment of the patients WM at this time.   #3.. Patient Active Problem List   Diagnosis Date Noted  . Acute systolic heart failure ([place]) 03/18/2017  . Pulmonary embolism ([place]) 02/17/2017  . Chronic combined systolic (congestive) and diastolic (congestive) heart failure ([place]) 02/17/2017  . Acute respiratory failure ([place]) 02/17/2017  . Systolic CHF ([place]) 16/06/9603  . Cholangitis   . Cholangiocarcinoma ([place])   . Obstructive jaundice   .  Hyperbilirubinemia   . Common bile duct stricture   . Jaundice 10/22/2016  . Supratherapeutic INR 10/22/2016  . GERD (gastroesophageal reflux disease) 10/22/2016  . Anxiety 10/22/2016  . Cough 10/09/2016  . Thyroid goiter 02/26/2012  . Aortic root enlargement ([place]) 01/12/2012  . Long term (current) use of anticoagulants 01/06/2012  . Thrombus of left atrial appendage 01/06/2012  . Acute Systolic dysfunction/cardiomyopathy due to prolonged tachycardia/EF 25% 12/30/2011  . Thrombocytopenia ([place]) 12/30/2011  . Other dysphagia 12/30/2011  .  Atrial fibrillation with RVR ([place]) 12/28/2011  . HTN (hypertension), benign 12/28/2011    - will need to continue f/u with PCP and cardiologist on discharge.  CT chest /Abd/pelvis in 2 months RTC with Dr Irene Limbo in 2 months with labs, CT chest/abd/pelvis   All of the patients questions were answered with apparent satisfaction. The patient knows to call the clinic with any problems, questions or concerns.  I spent 20 minutes counseling the patient face to face. The total time spent in the appointment was 30 minutes and more than 50% was on counseling and direct patient cares.    Sullivan Lone MD [place] AAHIVMS [facility] Hematology/Oncology Physician [facility]  (Office):       [phone] (Work cell):  [phone] (Fax):           [phone]  .03/17/2017

## 2017-03-19 NOTE — Discharge Instructions (Signed)

## 2017-03-20 DIAGNOSIS — N183 Chronic kidney disease, stage 3 (moderate): Secondary | ICD-10-CM

## 2017-03-20 DIAGNOSIS — C221 Intrahepatic bile duct carcinoma: Secondary | ICD-10-CM

## 2017-03-20 DIAGNOSIS — I495 Sick sinus syndrome: Secondary | ICD-10-CM

## 2017-03-20 LAB — BASIC METABOLIC PANEL
Anion gap: 15 (ref 5–15)
BUN: 61 mg/dL — ABNORMAL HIGH (ref 6–20)
CO2: 25 mmol/L (ref 22–32)
Calcium: 9.1 mg/dL (ref 8.9–10.3)
Chloride: 101 mmol/L (ref 101–111)
Creatinine, Ser: 2.53 mg/dL — ABNORMAL HIGH (ref 0.61–1.24)
GFR calc Af Amer: 26 mL/min — ABNORMAL LOW (ref >60)
GFR calc non Af Amer: 22 mL/min — ABNORMAL LOW (ref >60)
Glucose, Bld: 116 mg/dL — ABNORMAL HIGH (ref 65–99)
Potassium: 5 mmol/L (ref 3.5–5.1)
Sodium: 141 mmol/L (ref 135–145)

## 2017-03-20 MED ORDER — FUROSEMIDE 80 MG PO TABS
80.0000 mg | ORAL_TABLET | Freq: Every day | ORAL | Status: DC
  Administered 2017-03-21: 80 mg via ORAL
  Filled 2017-03-20: qty 1

## 2017-03-20 MED ORDER — APIXABAN 2.5 MG PO TABS
2.5000 mg | ORAL_TABLET | Freq: Two times a day (BID) | ORAL | Status: DC
  Administered 2017-03-20 – 2017-03-24 (×9): 2.5 mg via ORAL
  Filled 2017-03-20 (×9): qty 1

## 2017-03-20 NOTE — Progress Notes (Signed)
Palliative:  Initially met with patient yesterday. No changes in plan of care. Plan to meet today with patient and wife at bedside ~11:00 am to further discuss. Family seems aware that he is critically ill. Mrs. Klosinski has her own scans and blood work at the [facility] today (she told me that she has cancer as well). Will hopefully be able to get further in discussion with family at bedside. Thank you for this consult.   Vinie Sill, NP Palliative Medicine Team Pager # [phone] (M-F 8a-5p) Team Phone # [phone] (Nights/Weekends)

## 2017-03-20 NOTE — Progress Notes (Signed)
ANTICOAGULATION CONSULT NOTE - Initial Consult  Pharmacy Consult:  Eliquis Indication:  History of Afib, PE/atrial thrombus  Allergies  Allergen Reactions  . Codeine Other (See Comments)    nervousness  . Simvastatin Other (See Comments)    Muscle pain/hurting  . Vicodin [Hydrocodone-Acetaminophen] Other (See Comments)    nervousness    Patient Measurements: Height: 5\' 10"  (177.8 cm) Weight: 204 lb (92.5 kg) IBW/kg (Calculated) : 73  Vital Signs: Temp: 98 F (36.7 C) (07/20 0540) Temp Source: Oral (07/20 0540) BP: 103/76 (07/20 0540) Pulse Rate: 91 (07/20 0540)  Labs:  Recent Labs  03/18/17 1830 03/19/17 0356 03/20/17 0321  HGB 11.8*  --   --   HCT 37.4*  --   --   PLT 137*  --   --   APTT 36  --   --   LABPROT 16.9*  --   --   INR 1.36  --   --   CREATININE 1.75* 1.79* 2.53*    Estimated Creatinine Clearance: 25.7 mL/min (A) (by C-G formula based on SCr of 2.53 mg/dL (H)).   Medical History: Past Medical History:  Diagnosis Date  . A-fib [place]) May 2013   s/p TEE/cardioversion June 2013  . Anxiety   . Aortic root enlargement (HCC)    at least 5cm by CT  . Arthritis   . Atrial thrombus   . Bradycardia   . Bronchitis   . Cancer ([place]) 2016   lymphoma  . Chronic anticoagulation    on coumadin - checked in [place]  . Chronic systolic heart failure ([place]) 12/28/2011  . Congestive heart failure (CHF) ([place]) 2013  . Diverticulosis   . Dysrhythmia    AFib  . GERD (gastroesophageal reflux disease)   . Hemorrhoids   . HTN (hypertension)   . PE (pulmonary thromboembolism) ([place]) 01/2017  . Pneumonia May 2013  . Pneumonia 2013  . 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 Thrombocytopenia ([place])      Assessment: 50 YOM with history of PE, atrial thrombus and Afib to continue on Eliquis.  Patient's renal function is worsening  with diuresis.  Lasix dose held today and dose is also changed to PO and reduced.  Discussed with Dr. Sallyanne Kuster, reducing Eliquis dose for renal function only applies to the Afib indication and not for history of blood clots.  However, patient does not want to be on Coumadin and Lasix is being addressed to allow renal function to improve.  Will hold off on transitioning patient to heparin bridge.    Goal of Therapy:  Appropriate anticoagulation Monitor platelets by anticoagulation protocol: Yes    Plan:  - Eliquis 2.5mg  PO BID - Monitor renal fxn and adjust dose as appropriate - Hold KCL for now   Lorenzo Arscott D. Mina Marble, PharmD, BCPS Pager:  [phone] 03/20/2017, 9:56 AM

## 2017-03-20 NOTE — Consult Note (Signed)
   [facility] Inpatient Consult   03/20/2017  Jimmy Myers 07-Apr-1932 [id number]  Patient assessed for [facility] services. Patient with 3 admissions in the past 6 monthsand a beneficiary of [place].  Patient admitted with atrial fib and acute systolic HF.  Met with the patient at the bedside.  Patient states he manages his own health with checking his blood pressures, weighing and his medications.  He states he would like to review the information.  Provided the patient with a brochure, 24 hour nurse advise line magnet and contact information.  Patient states he has concerns about having a large bill.  He states other than that he manages everything.  Encouraged him to contact the HTA concierge for specific questions on the back of his card.  Patient verbalized understanding.  Please conact for questions:   Natividad Brood, RN BSN [facility] Liaison  [phone] business mobile phone Toll free office [phone]

## 2017-03-20 NOTE — Progress Notes (Signed)
Progress Note  Patient Name: Jimmy Myers Date of Encounter: 03/20/2017  Primary Cardiologist: Allred  Subjective   Breathing better, able to lie flat. Ventricular rate around 110 at rest, atrial fibrillation Net diuresis 1 L. Still edematous. Wt 204 lb. Recently at home, weight 195. In March, weight was 180s. Creatinine has worsened.  Inpatient Medications    Scheduled Meds: . apixaban  5 mg Oral BID  . metoprolol succinate  75 mg Oral Daily  . pantoprazole  40 mg Oral Daily  . polyethylene glycol  17 g Oral QHS  . potassium chloride  20 mEq Oral Daily  . sodium chloride flush  3 mL Intravenous Q12H   Continuous Infusions: . sodium chloride    . amiodarone 30 mg/hr (03/20/17 0337)  . furosemide Stopped (03/19/17 1838)   PRN Meds: sodium chloride, acetaminophen, ALPRAZolam, ondansetron (ZOFRAN) IV, polyethylene glycol, sodium chloride flush   Vital Signs    Vitals:   03/19/17 1203 03/19/17 2018 03/20/17 0025 03/20/17 0540  BP: 109/61 98/71 106/71 103/76  Pulse: (!) 111 89 81 91  Resp: 18 18 18    Temp: 98.4 F (36.9 C) 97.6 F (36.4 C) 97.7 F (36.5 C) 98 F (36.7 C)  TempSrc: Oral Oral Oral Oral  SpO2: 97% 100% 95% 95%  Weight:    204 lb (92.5 kg)  Height:        Intake/Output Summary (Last 24 hours) at 03/20/17 0934 Last data filed at 03/20/17 0835  Gross per 24 hour  Intake             1076 ml  Output             1101 ml  Net              -25 ml   Filed Weights   03/18/17 1715 03/19/17 0651 03/20/17 0540  Weight: 210 lb 6.4 oz (95.4 kg) 205 lb 4.8 oz (93.1 kg) 204 lb (92.5 kg)    Telemetry    AFib w RVr - Personally Reviewed  ECG    No new - Personally Reviewed  Physical Exam  More relaxed, lying almost flat GEN: No acute distress.   Neck: No JVD Cardiac: irregular, no murmurs, rubs, or gallops. 2+ dependent edema Respiratory: Clear to auscultation bilaterally. GI: Soft, nontender, non-distended  MS: No edema; No deformity. Neuro:   Nonfocal  Psych: Normal affect   Labs    Chemistry Recent Labs Lab 03/18/17 1830 03/19/17 0356 03/20/17 0321  NA 140 141 141  K 4.3 4.0 5.0  CL 103 105 101  CO2 29 27 25   GLUCOSE 147* 119* 116*  BUN 44* 47* 61*  CREATININE 1.75* 1.79* 2.53*  CALCIUM 8.9 8.7* 9.1  PROT 5.8*  --   --   ALBUMIN 3.3*  --   --   AST 58*  --   --   ALT 68*  --   --   ALKPHOS 214*  --   --   BILITOT 1.5*  --   --   GFRNONAA 35* 34* 22*  GFRAA 40* 39* 26*  ANIONGAP 8 9 15      Hematology Recent Labs Lab 03/18/17 1830  WBC 6.3  RBC 3.86*  HGB 11.8*  HCT 37.4*  MCV 96.9  MCH 30.6  MCHC 31.6  RDW 14.8  PLT 137*    Cardiac EnzymesNo results for input(s): TROPONINI in the last 168 hours. No results for input(s): TROPIPOC in the last 168 hours.  BNP Recent Labs Lab 03/18/17 1830  BNP 1,928.8*     DDimer No results for input(s): DDIMER in the last 168 hours.   Radiology    No results found.  Cardiac Studies   ECHO 02/19/2017 - Left ventricle: The cavity size was severely dilated. Systolic function was severely reduced. The estimated ejection fraction was in the range of 20% to 25%. Severe diffuse hypokinesis with regional variations. The study was not technically sufficient to allow evaluation of LV diastolic dysfunction due to atrial fibrillation. - Aortic valve: Moderately to severely calcified annulus. Trileaflet; mildly thickened, moderately calcified leaflets. Valve area (VTI): 1.92 cm^2. Valve area (Vmax): 1.77 cm^2. Valve area (Vmean): 2.15 cm^2. - Aorta: Aortic root dimension: 50 mm (ED). - Aortic root: The aortic root was severely dilated. - Left atrium: The atrium was moderately to severely dilated. - Right ventricle: The cavity size was mildly dilated. Wall thickness was normal. - Pulmonary arteries: PA peak pressure: 34 mm Hg (S).  Patient Profile     83 y.o. male with recurrent persistent atrial fibrillation and previous systolic HF  (improved LVEF, now again severely depressed), recent pulmonary embolism, cholangiocarcinoma and Waldenstrom's macroglobulinemia, CKD, presents with severe RVR and HF decompensation.  Assessment & Plan    1. AFib: persistent, very hard to rate control and associated with tachycardia cardiomyopathy. Unable to increase conventional AV blocking agents due to BP. Continue IV amiodarone another 24h, switch to PO tomorrow. Digoxin would be perilous due to poor renal function. Acknowledge problems with bradycardia in the past (tachy-brady sd.), will stop beta blocker if he converts and has bradycardia. Pacemaker may be necessary. Would like to avoid such an invasive procedure if his prognosis is poor from neoplasm. Consider DCCV if we cannot achieve rate control, but better odds for maintenance of SR if he is "loaded" with amio. On Eliquis, will adjust the dose for current renal function GFR<30. 2. CHF:  EF 20%, presumed tachycardia related, but he has evident atherosclerosis in the coronary tree on CT. He is a poor candidate for revascularization and I do not think we will pursue the CAD diagnosis at this time. BP limits HF meds. 3. Recent PE: on Eliquis. Adjust dose for renal function. He does not want warfarin. 4. Extrahepatic cholangiocarcinoma with biliary obstruction and cholangitis s/p stent and SBRT. I would assume poor poor prognosis, but he insists on full code status. Had an initial encounter with palliative care yesterday, plan for another visit today. Their help is greatly appreciated.  5. Waldenstrom's macroglobulinemia:  May be promoting hypercoagulability as well. 6. Acute on CKD: recent worsening of renal function is probably related to CHF, but may also have some nephrotoxicity form CT w contrast  X 2 performed on 03/10/2017. Creatinine jumped up today. Will switch to oral diuretics and plan very slow diuresis. Hopefully will see improvement with lower HR and more effective cardiac  performance.  Signed, Sanda Klein, MD  03/20/2017, 9:34 AM

## 2017-03-20 NOTE — Progress Notes (Signed)
RN noted Amio gtt line not hung with filter. Removed IV, started new line with filter set.

## 2017-03-20 NOTE — Progress Notes (Signed)
Paged cards NP to make aware of amio gtt not hung with filter and IV site. No new orders at this time.

## 2017-03-20 NOTE — Progress Notes (Signed)
Daily Progress Note   Patient Name: Jimmy Myers       Date: 03/20/2017 DOB: [birth year]  Age: 83 y.o. MRN#: [id number] Attending Physician: Thayer Headings, MD Primary Care Physician: [name], MD Admit Date: 03/18/2017  Reason for Consultation/Follow-up: Establishing goals of care  Subjective: Jimmy Myers breathing is a little improved but remains fluid overloaded. Renal function is worse.   Length of Stay: 1  Current Medications: Scheduled Meds:  . apixaban  2.5 mg Oral BID  . [START ON 03/21/2017] furosemide  80 mg Oral Daily  . metoprolol succinate  75 mg Oral Daily  . pantoprazole  40 mg Oral Daily  . polyethylene glycol  17 g Oral QHS  . sodium chloride flush  3 mL Intravenous Q12H    Continuous Infusions: . sodium chloride    . amiodarone 30 mg/hr (03/20/17 0337)    PRN Meds: sodium chloride, acetaminophen, ALPRAZolam, ondansetron (ZOFRAN) IV, polyethylene glycol, sodium chloride flush  Physical Exam  Constitutional: He appears well-developed. He appears lethargic. He has a sickly appearance.  HENT:  Head: Normocephalic and atraumatic.  Cardiovascular: Normal rate.  An irregularly irregular rhythm present.  Pulmonary/Chest: Breath sounds normal. No accessory muscle usage. No tachypnea. He is in respiratory distress.  Resp distress more mild today  Abdominal: He exhibits distension.  Neurological: He appears lethargic.  Mostly oriented and aware but limited d/t ability to maintain alertness  Nursing note and vitals reviewed.           Vital Signs: BP 100/76 (BP Location: Left Arm)   Pulse 80   Temp 98.6 F (37 C) (Oral)   Resp 18   Ht _0  (1.778 m)   Wt 92.5 kg (204 lb)   SpO2 95%   BMI 29.27 kg/m  SpO2: SpO2: 95 % O2 Device: O2 Device: Not  Delivered O2 Flow Rate:    Intake/output summary:  Intake/Output Summary (Last 24 hours) at 03/20/17 1318 Last data filed at 03/20/17 1145  Gross per 24 hour  Intake              856 ml  Output             1201 ml  Net             -345 ml   LBM:  Last BM Date: 03/19/17 Baseline Weight: Weight: 95.4 kg (210 lb 6.4 oz) (scale b) Most recent weight: Weight: 92.5 kg (204 lb)       Palliative Assessment/Data: 20%    Flowsheet Rows     Most Recent Value  Intake Tab  Referral Department  Cardiology  Unit at Time of Referral  Cardiac/Telemetry Unit  Palliative Care Primary Diagnosis  Cardiac  Date Notified  03/18/17  Palliative Care Type  New Palliative care  Reason for referral  Clarify Goals of Care  Date of Admission  03/18/17  Date first seen by Palliative Care  03/19/17  # of days Palliative referral response time  1 Day(s)  # of days IP prior to Palliative referral  0  Clinical Assessment  Psychosocial & Spiritual Assessment  Palliative Care Outcomes      Patient Active Problem List   Diagnosis Date Noted  . Acute systolic heart failure ([place]) 03/18/2017  . Pulmonary embolism ([place]) 02/17/2017  . Chronic combined systolic (congestive) and diastolic (congestive) heart failure ([place]) 02/17/2017  . Acute respiratory failure ([place]) 02/17/2017  . Systolic CHF ([place]) 71/01/2693  . Cholangitis   . Cholangiocarcinoma ([place])   . Obstructive jaundice   . Hyperbilirubinemia   . Common bile duct stricture   . Jaundice 10/22/2016  . Supratherapeutic INR 10/22/2016  . GERD (gastroesophageal reflux disease) 10/22/2016  . Anxiety 10/22/2016  . Cough 10/09/2016  . Thyroid goiter 02/26/2012  . Aortic root enlargement ([place]) 01/12/2012  . Long term (current) use of anticoagulants 01/06/2012  . Thrombus of left atrial appendage 01/06/2012  . Acute Systolic dysfunction/cardiomyopathy due to prolonged tachycardia/EF 25% 12/30/2011  . Thrombocytopenia ([place]) 12/30/2011  . Other dysphagia  12/30/2011  . Atrial fibrillation with RVR ([place]) 12/28/2011  . HTN (hypertension), benign 12/28/2011    Palliative Care Assessment & Plan   HPI: 83 y.o. male  with past medical history of cholangiocarcinoma (Dr. Irene Limbo), s/p biliary stent placement, s/p SBRT to CBD tumor (Dr. Lisbeth Renshaw), right PE, systolic heart failure EF 20%, atrial fibrillation, Waldenstrom's macroglobulinemia, thoracic aortic aneurysm admitted on 03/18/2017 with heart failure exacerbation and atrial fibrillation with associated SOB, weakness, abd swelling. Admitted for IV diuresis.   Assessment: I met today privately with Ms. Letta Median (Jimmy Myers wife and surrogate decision maker). Letta Median tells me PRIVATELY (she does not want her husband or children to know) that she has thought much about her husband's critical illness and QOL. She feels strongly that he should not be resuscitated and has good understanding of his current illnesses/poor prognosis and declining QOL. She says that he has always said that he would not want to be kept alive on machines and would not want resuscitation or intubation (this is consistent with MOST form in electronic record that he completed 11/01/16 with Dr. Gildardo Cranker and signed by Josph Macho). She would not want to see him prolonged or brought back when we know that he only faces more suffering. She would also want information on obtaining hospice services once they go home if needed. She understands that we are concerned about his renal function and that if renal function continues to decline he may have very little time left. She appreciates and trusts the medical team "do what ya'll feel is best." Emotional support provided.   Late entry: I did return later to discuss privately with Jimmy Myers regarding his wishes. He seems to have some understanding of his poor prognosis and conditions although he does fall asleep often during our conversation. He  does confirm that he does not believe that he would want resuscitation  understanding it would not fix his underlying issues and only cause more suffering. He does waver when discussing intubation but we agreed to have BiPAP and try this instead of intubation. I did not share my conversation with his wife per her request. I have made partial code with BiPAP only. He also mentions himself about possibly needing the help of hospice at that point which I supported but he remains hopeful for some improvement.   Recommendations/Plan:  Optimize health and diuresis per cardiology.   Goals of Care and Additional Recommendations:  Limitations on Scope of Treatment: Full Scope Treatment  Code Status:  BiPAP only  Prognosis:   Prognosis very poor but dependent on the next couple days how he responds to interventions. I do feel he is still eligible for hospice with best case scenario d/t his cardiac conditions.   Discharge Planning:  To Be Determined   Thank you for allowing the Palliative Medicine Team to assist in the care of this patient.   Total Time 46mn Prolonged Time Billed  no       Greater than 50%  of this time was spent counseling and coordinating care related to the above assessment and plan.  AVinie Sill NP Palliative Medicine Team Pager # [phone](M-F 8a-5p) Team Phone # [phone](Nights/Weekends)

## 2017-03-21 LAB — BASIC METABOLIC PANEL
Anion gap: 13 (ref 5–15)
BUN: 77 mg/dL — ABNORMAL HIGH (ref 6–20)
CO2: 23 mmol/L (ref 22–32)
Calcium: 8.9 mg/dL (ref 8.9–10.3)
Chloride: 103 mmol/L (ref 101–111)
Creatinine, Ser: 2.7 mg/dL — ABNORMAL HIGH (ref 0.61–1.24)
GFR calc Af Amer: 24 mL/min — ABNORMAL LOW (ref >60)
GFR calc non Af Amer: 20 mL/min — ABNORMAL LOW (ref >60)
Glucose, Bld: 151 mg/dL — ABNORMAL HIGH (ref 65–99)
Potassium: 4.6 mmol/L (ref 3.5–5.1)
Sodium: 139 mmol/L (ref 135–145)

## 2017-03-21 NOTE — Progress Notes (Signed)
Pharmacist Heart Failure Core Measure Documentation  Assessment: Jimmy Myers has an EF documented as 20-30% on 6/18 by ECHO.  Rationale: Heart failure patients with left ventricular systolic dysfunction (LVSD) and an EF < 40% should be prescribed an angiotensin converting enzyme inhibitor (ACEI) or angiotensin receptor blocker (ARB) at discharge unless a contraindication is documented in the medical record.  This patient is not currently on an ACEI or ARB for HF.  This note is being placed in the record in order to provide documentation that a contraindication to the use of these agents is present for this encounter.  ACE Inhibitor or Angiotensin Receptor Blocker is contraindicated (specify all that apply)  []   ACEI allergy AND ARB allergy []   Angioedema []   Moderate or severe aortic stenosis []   Hyperkalemia []   Hypotension []   Renal artery stenosis [x]   Worsening renal function, preexisting renal disease or dysfunction   Orma Render 03/21/2017 2:53 PM

## 2017-03-21 NOTE — Plan of Care (Signed)
Problem: Nutrition: Goal: Adequate nutrition will be maintained Outcome: Not Progressing Patient is eating 25% of meal daily states that he is not hungry and weak

## 2017-03-21 NOTE — Plan of Care (Signed)
Problem: Physical Regulation: Goal: Ability to maintain clinical measurements within normal limits will improve Outcome: Progressing HR maintains in the low 100's, O2 sats in the 90's on room air, BP soft   Problem: Activity: Goal: Risk for activity intolerance will decrease Outcome: Not Progressing Patient with difficulty standing, very weak, short of breath with any exertion   Problem: Nutrition: Goal: Adequate nutrition will be maintained Outcome: Not Progressing Patient with poor appetite   Problem: Activity: Goal: Capacity to carry out activities will improve Outcome: Not Progressing Has met with pallative care regarding goals of care  Problem: Education: Goal: Ability to demonstrate managment of disease process will improve Outcome: Progressing With assistance from wife and sons    

## 2017-03-21 NOTE — Progress Notes (Signed)
Patient stable during 7 a to 7 p shift, somewhat confused and impulsive in the a.m. But improved as the day went on.  Wife at bedside large portion of shift.  Patient up with 1 max assist to stand and get to chair.  Maintains oxygen saturation on room air however does get dyspneic with any exertion.  Heart rate remains in the low 100's, BP soft, receiving diuresis po at this time.

## 2017-03-21 NOTE — Progress Notes (Signed)
Progress Note  Patient Name: Jimmy Myers Date of Encounter: 03/21/2017  Primary Cardiologist: Allred  Subjective   Breathing seems to be better today. Wife is at the bedside. No pain reported.   Inpatient Medications    Scheduled Meds: . apixaban  2.5 mg Oral BID  . furosemide  80 mg Oral Daily  . metoprolol succinate  75 mg Oral Daily  . pantoprazole  40 mg Oral Daily  . polyethylene glycol  17 g Oral QHS  . sodium chloride flush  3 mL Intravenous Q12H   Continuous Infusions: . sodium chloride    . amiodarone 30 mg/hr (03/21/17 0354)   PRN Meds: sodium chloride, acetaminophen, ALPRAZolam, ondansetron (ZOFRAN) IV, polyethylene glycol, sodium chloride flush   Vital Signs    Vitals:   03/20/17 2026 03/21/17 0419 03/21/17 0431 03/21/17 0921  BP: 99/75  99/77 100/76  Pulse: 82  (!) 109 (!) 108  Resp: 20  18   Temp: 98.9 F (37.2 C)  97.8 F (36.6 C)   TempSrc: Oral  Oral   SpO2: 97%  94% 99%  Weight:  207 lb 4.8 oz (94 kg)    Height:        Intake/Output Summary (Last 24 hours) at 03/21/17 1058 Last data filed at 03/21/17 0200  Gross per 24 hour  Intake              240 ml  Output              450 ml  Net             -210 ml   Filed Weights   03/19/17 0651 03/20/17 0540 03/21/17 0419  Weight: 205 lb 4.8 oz (93.1 kg) 204 lb (92.5 kg) 207 lb 4.8 oz (94 kg)    Telemetry    AFib rates low 100s - Personally Reviewed  ECG    N/A - Personally Reviewed  Physical Exam   General: Well developed, well nourished, male appearing in no acute distress. Appears tired, falling asleep during conversation.  Head: Normocephalic, atraumatic.  Neck: Supple without bruits, JVD. Lungs:  Resp regular and unlabored, CTA. Heart: Irreg Irreg, S1, S2, no S3, S4, or murmur; no rub. Abdomen: Soft, non-tender, non-distended with normoactive bowel sounds. No hepatomegaly. No rebound/guarding. No obvious abdominal masses. Extremities: No clubbing, cyanosis, 2+ bilateral LE edema.  Distal pedal pulses are 2+ bilaterally. Neuro: Alert and oriented X 3. Moves all extremities spontaneously. Psych: Normal affect.  Labs    Chemistry Recent Labs Lab 03/18/17 1830 03/19/17 0356 03/20/17 0321 03/21/17 0331  NA 140 141 141 139  K 4.3 4.0 5.0 4.6  CL 103 105 101 103  CO2 29 27 25 23   GLUCOSE 147* 119* 116* 151*  BUN 44* 47* 61* 77*  CREATININE 1.75* 1.79* 2.53* 2.70*  CALCIUM 8.9 8.7* 9.1 8.9  PROT 5.8*  --   --   --   ALBUMIN 3.3*  --   --   --   AST 58*  --   --   --   ALT 68*  --   --   --   ALKPHOS 214*  --   --   --   BILITOT 1.5*  --   --   --   GFRNONAA 35* 34* 22* 20*  GFRAA 40* 39* 26* 24*  ANIONGAP 8 9 15 13      Hematology Recent Labs Lab 03/18/17 1830  WBC 6.3  RBC 3.86*  HGB 11.8*  HCT 37.4*  MCV 96.9  MCH 30.6  MCHC 31.6  RDW 14.8  PLT 137*    Cardiac EnzymesNo results for input(s): TROPONINI in the last 168 hours. No results for input(s): TROPIPOC in the last 168 hours.   BNP Recent Labs Lab 03/18/17 1830  BNP 1,928.8*     DDimer No results for input(s): DDIMER in the last 168 hours.    Radiology    No results found.  Cardiac Studies   ECHO 02/19/2017  - Left ventricle: The cavity size was severely dilated. Systolic function was severely reduced. The estimated ejection fraction was in the range of 20% to 25%. Severe diffuse hypokinesis with regional variations. The study was not technically sufficient to allow evaluation of LV diastolic dysfunction due to atrial fibrillation. - Aortic valve: Moderately to severely calcified annulus. Trileaflet; mildly thickened, moderately calcified leaflets. Valve area (VTI): 1.92 cm^2. Valve area (Vmax): 1.77 cm^2. Valve area (Vmean): 2.15 cm^2. - Aorta: Aortic root dimension: 50 mm (ED). - Aortic root: The aortic root was severely dilated. - Left atrium: The atrium was moderately to severely dilated. - Right ventricle: The cavity size was mildly dilated.  Wall thickness was normal. - Pulmonary arteries: PA peak pressure: 34 mm Hg (S).  Patient Profile     83 y.o. male with recurrent persistent atrial fibrillation and previous systolic HF (improved LVEF, now again severely depressed), recent pulmonary embolism, cholangiocarcinoma and Waldenstrom's macroglobulinemia, CKD, presents with severe RVR and HF decompensation.   Assessment & Plan    1. Afib: Remains in Afib rates in the 100s on IV amiodarone. Plans were to transition to oral today. Lewis Grivas discuss with MD regarding transition. Unable to increase conventional AV blocking agents due to BP.  -- Acknowledge problems with bradycardia in the past (tachy-brady sd.), Jachin Coury stop beta blocker if he converts and has bradycardia. Pacemaker may be necessary. Would like to avoid such an invasive procedure if his prognosis is poor from neoplasm. Consider DCCV if we cannot achieve rate control, but better odds for maintenance of SR if he is "loaded" with amio. On Eliquis, Ayush Boulet adjust the dose for current renal function GFR<30.  2. CHF: EF 20%, felt to be related to tachycardia but has atherosclerosis on CT. Not a good candidate for revascularization given chronic illness, along with CKD.  -- was being diuresed with IV lasix but transitioned to oral dose yesterday. Cr worse today, but remained volume overloaded. May need nephrology involvement at this point.   3. Recent PE: on Eliquis. Adjust dose for renal function. He does not want warfarin.  4. 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 Assume poor poor prognosis, but he insisted on full code status. Had an initial encounter with palliative care yesterday, patient made a partial code with Bipap.   5. Waldenstrom's macroglobulinemia: May be promoting hypercoagulability as well.  6. Acute on CKD: recent worsening of renal function is probably related to CHF, but may also have some nephrotoxicity form CT w  contrast X 2 performed on 03/10/2017. Creatinine climbed again today.   Signed, Reino Bellis, NP  03/21/2017, 10:58 AM    I have seen and examined this patient with Reino Bellis.  Agree with above, note added to reflect my findings.  On exam, iRRR, no murmurs, faint bibasilar crackles.  It appears that he is converted to atrial flutter from atrial fibrillation. He is currently on IV amiodarone, and would continue that medication at this time. His rate is elevated, but  blood pressure prohibits the use of further rate controlling medications. He is also volume overloaded with significant lower extremity edema and possibly some crackles in his lungs. Unfortunately, his creatinine has been rising, and is at its highest at 2.7 today. He was recently switched to by mouth Lasix. With his worsening renal function, and evidence of volume overload, he may be nearing end stage of his disease process. We'll plan for further attempts at diuresis with by mouth Lasix. I had a long discussion with his wife about the potential for further therapy with Lasix for volume management versus transitioning care. I told her that he may have worsening volume status and kidney function, and she Zeeshan Korte think about transitioning to a comfort care alternatives. She Alonah Lineback discuss this with her husband today.  Terron Merfeld M. Arseniy Toomey MD 03/21/2017 11:34 AM

## 2017-03-22 DIAGNOSIS — Z7189 Other specified counseling: Secondary | ICD-10-CM

## 2017-03-22 DIAGNOSIS — Z515 Encounter for palliative care: Secondary | ICD-10-CM

## 2017-03-22 LAB — BASIC METABOLIC PANEL
Anion gap: 13 (ref 5–15)
BUN: 91 mg/dL — ABNORMAL HIGH (ref 6–20)
CO2: 25 mmol/L (ref 22–32)
Calcium: 8.7 mg/dL — ABNORMAL LOW (ref 8.9–10.3)
Chloride: 100 mmol/L — ABNORMAL LOW (ref 101–111)
Creatinine, Ser: 3.29 mg/dL — ABNORMAL HIGH (ref 0.61–1.24)
GFR calc Af Amer: 19 mL/min — ABNORMAL LOW (ref >60)
GFR calc non Af Amer: 16 mL/min — ABNORMAL LOW (ref >60)
Glucose, Bld: 125 mg/dL — ABNORMAL HIGH (ref 65–99)
Potassium: 5.3 mmol/L — ABNORMAL HIGH (ref 3.5–5.1)
Sodium: 138 mmol/L (ref 135–145)

## 2017-03-22 MED ORDER — OXYCODONE HCL 20 MG/ML PO CONC: 5.0000 mg | ORAL | Status: DC | PRN

## 2017-03-22 MED ORDER — TRAZODONE HCL 50 MG PO TABS
25.0000 mg | ORAL_TABLET | Freq: Every evening | ORAL | Status: DC | PRN
  Administered 2017-03-23: 25 mg via ORAL
  Filled 2017-03-22: qty 1

## 2017-03-22 NOTE — Care Management Note (Signed)
Case Management Note  Patient Details  Name: Jimmy Myers MRN: [id number] Date of Birth: [birth year]  Subjective/Objective:  83 y.o. M with SHF. EF 20-25%. Pt has been seen by [facility] and has chosen to be discharged with Hospice. Wife, Jimmy Myers has chosen [facility] to provide this care. CM called and LM for on call person to make new referral for this pt.                    Action/Plan: CM will follow closely for disposition/discharge needs.    Expected Discharge Date:                  Expected Discharge Plan:  Home/Self Care  In-House Referral:     Discharge planning Services  CM Consult  Post Acute Care Choice:  Hospice Choice offered to:     DME Arranged:    DME Agency:     HH Arranged:    [place] Agency:  [facility]  Status of Service:  In process, will continue to follow  If discussed at Long Length of Stay Meetings, dates discussed:    Additional Comments:  Delrae Sawyers, RN 03/22/2017, 3:54 PM

## 2017-03-22 NOTE — Progress Notes (Signed)
Daily Progress Note   Patient Name: Jimmy Myers       Date: 03/22/2017 DOB: 28-Apr-1932  Age: 83 y.o. MRN#: [id number] Attending Physician: Thayer Headings, MD Primary Care Physician: [name], MD Admit Date: 03/18/2017  Reason for Consultation/Follow-up: Establishing goals of care  Subjective: Met with patient, his wife, and his son at bedside. Breathing is improved today per his wife- she states, "He's having a good day". Noted cardiology note- pt cr continuing to rise- pt likely at end stages of cardiac and renal disease. Renal function making diuresis difficult. Discussion re: transition to comfort measures has been initiated by cardiology providers.   Patient states at this point his goal is to return home and be kept comfortable and out of pain. He states he has had a good life. He does not want to return to the the hospital if he were to worsen, but instead would like support through a dying process. He is also adamant that he would not want to be "in a nursing home".   I reviewed that transition to Hospice support would include focus on comfort and quality of life. Treating symptoms of SOB and pain with medication in the home with the goal of supporting patient through end of life, understanding that patient has irreversible disease process. Avoiding hospitalization. Patient and family verbalized understanding.  We discussed possibility of returning home with Hospice support. Reviewed services offered by Hospice. Family feels they have enough support in place to be able to care for patient in the home for now. Spouse states she has been "preparing for this". Family agrees to home hospice referral.  He notes some difficulty falling sleeping and has had some anxiety. He has  alprazolam .'25mg'$  q8hrs prn and this has worked for him. Will add trazadone for sleep as well. Discussed using low dose opioid for SOB. Given his renal failure will initiate low dose oxycodone rather than morphine. Discussed SE's and encouraged him to continue bowel regimen of daily Miralax. ROS  Length of Stay: 3  Current Medications: Scheduled Meds:  . apixaban  2.5 mg Oral BID  . metoprolol succinate  75 mg Oral Daily  . pantoprazole  40 mg Oral Daily  . polyethylene glycol  17 g Oral QHS  . sodium chloride flush  3 mL Intravenous Q12H  Continuous Infusions: . sodium chloride    . amiodarone 30 mg/hr (03/22/17 0957)    PRN Meds: sodium chloride, acetaminophen, ALPRAZolam, ondansetron (ZOFRAN) IV, polyethylene glycol, sodium chloride flush  Physical Exam  Constitutional: He is oriented to person, place, and time. No distress.  Pulmonary/Chest: Effort normal.  Musculoskeletal: He exhibits edema.  Neurological: He is alert and oriented to person, place, and time.  Skin: Skin is warm and dry.  Nursing note and vitals reviewed.           Vital Signs: BP (!) 88/59 (BP Location: Left Arm)   Pulse (!) 55   Temp 97.8 F (36.6 C) (Oral)   Resp 18   Ht '5\' 10"'$  (1.778 m)   Wt 94.2 kg (207 lb 11.2 oz)   SpO2 94%   BMI 29.80 kg/m  SpO2: SpO2: 94 % O2 Device: O2 Device: Not Delivered O2 Flow Rate:    Intake/output summary:  Intake/Output Summary (Last 24 hours) at 03/22/17 1433 Last data filed at 03/22/17 1300  Gross per 24 hour  Intake            989.7 ml  Output              301 ml  Net            688.7 ml   LBM: Last BM Date: 03/20/17 Baseline Weight: Weight: 95.4 kg (210 lb 6.4 oz) (scale b) Most recent weight: Weight: 94.2 kg (207 lb 11.2 oz)       Palliative Assessment/Data: PPS: 30%   Flowsheet Rows     Most Recent Value  Intake Tab  Referral Department  Cardiology  Unit at Time of Referral  Cardiac/Telemetry Unit  Palliative Care Primary Diagnosis   Cardiac  Date Notified  03/18/17  Palliative Care Type  New Palliative care  Reason for referral  Clarify Goals of Care  Date of Admission  03/18/17  Date first seen by Palliative Care  03/19/17  # of days Palliative referral response time  1 Day(s)  # of days IP prior to Palliative referral  0  Clinical Assessment  Psychosocial & Spiritual Assessment  Palliative Care Outcomes      Patient Active Problem List   Diagnosis Date Noted  . Acute systolic heart failure ([place]) 03/18/2017  . Pulmonary embolism ([place]) 02/17/2017  . Chronic combined systolic (congestive) and diastolic (congestive) heart failure ([place]) 02/17/2017  . Acute respiratory failure ([place]) 02/17/2017  . Systolic CHF ([place]) 96/22/2979  . Cholangitis   . Cholangiocarcinoma ([place])   . Obstructive jaundice   . Hyperbilirubinemia   . Common bile duct stricture   . Jaundice 10/22/2016  . Supratherapeutic INR 10/22/2016  . GERD (gastroesophageal reflux disease) 10/22/2016  . Anxiety 10/22/2016  . Cough 10/09/2016  . Thyroid goiter 02/26/2012  . Aortic root enlargement ([place]) 01/12/2012  . Long term (current) use of anticoagulants 01/06/2012  . Thrombus of left atrial appendage 01/06/2012  . Acute Systolic dysfunction/cardiomyopathy due to prolonged tachycardia/EF 25% 12/30/2011  . Thrombocytopenia ([place]) 12/30/2011  . Other dysphagia 12/30/2011  . Atrial fibrillation with RVR ([place]) 12/28/2011  . HTN (hypertension), benign 12/28/2011    Palliative Care Assessment & Plan   Patient Profile: 83 y.o.malewith past medical history of cholangiocarcinoma (Dr. Irene Limbo), s/p biliary stent placement, s/p SBRT to CBD tumor (Dr. Lisbeth Renshaw), right PE, systolic heart failure EF 20%, atrial fibrillation, Waldenstrom's macroglobulinemia, thoracic aortic aneurysmadmitted on 7/18/2018with heart failure exacerbation and atrial fibrillation with associated SOB, weakness, abd swelling. Admitted  for IV diuresis.    Assessment/Recommendations/Plan   D/C patient home with Hospice services and comfort medications in place  Oxycodone intensol SL '5mg'$  q1hr prn SOB  Continue alprazolam q8hr prn anxiety  Trazadone '25mg'$  po qhs prn  Goals of Care and Additional Recommendations:  Limitations on Scope of Treatment: Avoid Hospitalization, No Diagnostics, No IV Fluids and No Surgical Procedures  Code Status:  DNR  Prognosis:   < 6 weeks due to end stage CHF with renal failure, cholangiocarcinoma (no plans for treatment), plan to d/c home with Hospice   Discharge Planning:  Home with Hospice  Care plan was discussed with patient, his wife, and son  Thank you for allowing the Palliative Medicine Team to assist in the care of this patient.   Total time: 60 minutes  Greater than 50%  of this time was spent counseling and coordinating care related to the above assessment and plan.  Mariana Kaufman, AGNP-C Palliative Medicine   Please contact Palliative Medicine Team phone at [phone] for questions and concerns.

## 2017-03-22 NOTE — Progress Notes (Signed)
Pt is experiencing some acute retention bladder scan 217 With condom cath on. Placed on the beside commode to stimulate with urine output of 150.

## 2017-03-22 NOTE — Progress Notes (Signed)
ANTICOAGULATION CONSULT NOTE   Pharmacy Consult:  Eliquis Indication:  History of Afib, PE/atrial thrombus  Allergies  Allergen Reactions  . Codeine Other (See Comments)    nervousness  . Simvastatin Other (See Comments)    Muscle pain/hurting  . Vicodin [Hydrocodone-Acetaminophen] Other (See Comments)    nervousness    Patient Measurements: Height: 5\' 10"  (177.8 cm) Weight: 207 lb 11.2 oz (94.2 kg) IBW/kg (Calculated) : 73  Vital Signs: Temp: 97.8 F (36.6 C) (07/22 0650) Temp Source: Oral (07/22 0650) BP: 95/61 (07/22 0650) Pulse Rate: 95 (07/22 0650)  Labs:  Recent Labs  03/20/17 0321 03/21/17 0331 03/22/17 0222  CREATININE 2.53* 2.70* 3.29*    Estimated Creatinine Clearance: 20 mL/min (A) (by C-G formula based on SCr of 3.29 mg/dL (H)).   Medical History: Past Medical History:  Diagnosis Date  . A-fib [facility]) May 2013   s/p TEE/cardioversion June 2013  . Anxiety   . Aortic root enlargement (H[facility])    at least 5cm by CT  . Arthritis   . Atrial thrombus   . Bradycardia   . Bronchitis   . Cancer ([place]) 2016   lymphoma  . Chronic anticoagulation    on coumadin - checked in [place]  . Chronic systolic heart failure ([place]) 12/28/2011  . Congestive heart failure (CHF) ([place]) 2013  . Diverticulosis   . Dysrhythmia    AFib  . GERD (gastroesophageal reflux disease)   . Hemorrhoids   . HTN (hypertension)   . PE (pulmonary thromboembolism) ([place]) 01/2017  . Pneumonia May 2013  . Pneumonia 2013  . 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 Thrombocytopenia ([place])      Assessment: 74 YOM with history of PE, atrial thrombus and Afib to continue on Eliquis.  Patient's renal function is worsening with diuresis.  Lasix dose held today and dose is also changed to PO and reduced.  Discussed with Dr. Sallyanne Kuster, reducing Eliquis dose for  renal function only applies to the Afib indication and not for history of blood clots.  However, patient does not want to be on Coumadin and Lasix is being addressed to allow renal function to improve.  Will hold off on transitioning patient to heparin bridge.   Renal fxn in decline (SCr 3.29). Lasix being held.   Goal of Therapy:  Appropriate anticoagulation Monitor platelets by anticoagulation protocol: Yes    Plan:  - Continue Eliquis 2.5mg  PO BID - Monitor renal fxn and adjust dose as appropriate   Angus Seller, PharmD Pharmacy Resident Pager: [phone] 03/22/2017 11:19 AM

## 2017-03-22 NOTE — Progress Notes (Signed)
Progress Note  Patient Name: Jimmy Myers Date of Encounter: 03/22/2017  Primary Cardiologist: Allred  Subjective   Laying at 20 degrees mild SOB. Creatine continues to rise. Net positive yesterday.  Inpatient Medications    Scheduled Meds: . apixaban  2.5 mg Oral BID  . furosemide  80 mg Oral Daily  . metoprolol succinate  75 mg Oral Daily  . pantoprazole  40 mg Oral Daily  . polyethylene glycol  17 g Oral QHS  . sodium chloride flush  3 mL Intravenous Q12H   Continuous Infusions: . sodium chloride    . amiodarone 30 mg/hr (03/22/17 0052)   PRN Meds: sodium chloride, acetaminophen, ALPRAZolam, ondansetron (ZOFRAN) IV, polyethylene glycol, sodium chloride flush   Vital Signs    Vitals:   03/21/17 1200 03/21/17 2056 03/22/17 0600 03/22/17 0650  BP: (!) 88/64 99/70  95/61  Pulse: (!) 106 95  95  Resp: _0 Temp: 97.9 F (36.6 C) 97.9 F (36.6 C)  97.8 F (36.6 C)  TempSrc: Oral Oral  Oral  SpO2: 92% 98%  97%  Weight:   207 lb 11.2 oz (94.2 kg)   Height:        Intake/Output Summary (Last 24 hours) at 03/22/17 0911 Last data filed at 03/22/17 0053  Gross per 24 hour  Intake            629.7 ml  Output              100 ml  Net            529.7 ml   Filed Weights   03/20/17 0540 03/21/17 0419 03/22/17 0600  Weight: 204 lb (92.5 kg) 207 lb 4.8 oz (94 kg) 207 lb 11.2 oz (94.2 kg)    Telemetry    Atrial flutter, variable rates - Personally Reviewed  ECG    N/A - Personally Reviewed  Physical Exam   GEN: Well nourished, well developed, in no acute distress  HEENT: normal  Neck: no JVD, carotid bruits, or masses Cardiac: iRRR; no murmurs, rubs, or gallops, 2+ edema  Respiratory:  crackles bilaterally, normal work of breathing GI: soft, nontender, nondistended, + BS MS: no deformity or atrophy  Skin: warm and dry Neuro:  Strength and sensation are intact Psych: euthymic mood, full affect   Labs    Chemistry Recent Labs Lab 03/18/17 1830   03/20/17 0321 03/21/17 0331 03/22/17 0222  NA 140  < > 141 139 138  K 4.3  < > 5.0 4.6 5.3*  CL 103  < > 101 103 100*  CO2 29  < > _1 GLUCOSE 147*  < > 116* 151* 125*  BUN 44*  < > 61* 77* 91*  CREATININE 1.75*  < > 2.53* 2.70* 3.29*  CALCIUM 8.9  < > 9.1 8.9 8.7*  PROT 5.8*  --   --   --   --   ALBUMIN 3.3*  --   --   --   --   AST 58*  --   --   --   --   ALT 68*  --   --   --   --   ALKPHOS 214*  --   --   --   --   BILITOT 1.5*  --   --   --   --   GFRNONAA 35*  < > 22* 20* 16*  GFRAA 40*  < > 26* 24* 19*  ANIONGAP 8  < > _0 < > = values in this interval not displayed.   Hematology  Recent Labs Lab 03/18/17 1830  WBC 6.3  RBC 3.86*  HGB 11.8*  HCT 37.4*  MCV 96.9  MCH 30.6  MCHC 31.6  RDW 14.8  PLT 137*    Cardiac EnzymesNo results for input(s): TROPONINI in the last 168 hours. No results for input(s): TROPIPOC in the last 168 hours.   BNP  Recent Labs Lab 03/18/17 1830  BNP 1,928.8*     DDimer No results for input(s): DDIMER in the last 168 hours.    Radiology    No results found.  Cardiac Studies   ECHO 02/19/2017  - Left ventricle: The cavity size was severely dilated. Systolic function was severely reduced. The estimated ejection fraction was in the range of 20% to 25%. Severe diffuse hypokinesis with regional variations. The study was not technically sufficient to allow evaluation of LV diastolic dysfunction due to atrial fibrillation. - Aortic valve: Moderately to severely calcified annulus. Trileaflet; mildly thickened, moderately calcified leaflets. Valve area (VTI): 1.92 cm^2. Valve area (Vmax): 1.77 cm^2. Valve area (Vmean): 2.15 cm^2. - Aorta: Aortic root dimension: 50 mm (ED). - Aortic root: The aortic root was severely dilated. - Left atrium: The atrium was moderately to severely dilated. - Right ventricle: The cavity size was mildly dilated. Wall thickness was normal. - Pulmonary arteries: PA  peak pressure: 34 mm Hg (S).  Patient Profile     83 y.o. male with recurrent persistent atrial fibrillation and previous systolic HF (improved LVEF, now again severely depressed), recent pulmonary embolism, cholangiocarcinoma and Waldenstrom's macroglobulinemia, CKD, presents with severe RVR and HF decompensation.   Assessment & Plan    1. Afib/flutter: Has converted to atrial flutter with IV amiodarone and beta blockers. Flutter rate is better controlled today. He is currently anticoagulated with Eliquis. Would switch to by mouth amiodarone tomorrow.  2. CHF: Ejection fraction 20%. Is significantly volume overloaded today, with rising creatinine and positive despite high doses of oral Lasix. I'm concerned about the ability to continue to diurese him. The only other option is inotrope, but with his atrial arrhythmias, this may be less likely to be beneficial as it could cause uncontrolled rates. Unfortunately, it does seem that this could be potentially the end-stage of his cardiac and renal disease. I discussed with him the possibility of transitioning his care to comfort measures from aggressive therapies. I discussed this with his wife yesterday. He Alexcis Bicking think about these options and hopefully discuss them with his wife throughout the day today.  3. Recent PE: On liquids, dose adjusted for renal function.  4. Extrahepatic cholangiocarcinoma: Has had biliary obstruction and cholangitis status post biliary stent. Has met with palliative care and is a partial code. Discussed further transitioning to comfort measures today. This discussion should continue throughout his hospital stay.  5. Waldenstrom's macroglobulinemia: May be promoting hypercoagulability as well.  6. Acute on CKD: Recent worsening of kidney function is likely due to congestive heart failure. Unfortunately with diuresis, kidney function has not improved. We'll try holding off on Lasix dosing today, though I am concerned that his  kidney function Diago Haik not improve overall. He is likely not a candidate for dialysis, but renal consultation is also a possibility. Should his creatinine improves off of Lasix, would plan to restart tomorrow.  Signed, Ruvim Risko Meredith Leeds, MD  03/22/2017, 9:11 AM

## 2017-03-23 DIAGNOSIS — I483 Typical atrial flutter: Secondary | ICD-10-CM

## 2017-03-23 LAB — BASIC METABOLIC PANEL
Anion gap: 13 (ref 5–15)
BUN: 107 mg/dL — ABNORMAL HIGH (ref 6–20)
CO2: 27 mmol/L (ref 22–32)
Calcium: 8.4 mg/dL — ABNORMAL LOW (ref 8.9–10.3)
Chloride: 95 mmol/L — ABNORMAL LOW (ref 101–111)
Creatinine, Ser: 3.86 mg/dL — ABNORMAL HIGH (ref 0.61–1.24)
GFR calc Af Amer: 15 mL/min — ABNORMAL LOW (ref >60)
GFR calc non Af Amer: 13 mL/min — ABNORMAL LOW (ref >60)
Glucose, Bld: 106 mg/dL — ABNORMAL HIGH (ref 65–99)
Potassium: 5.3 mmol/L — ABNORMAL HIGH (ref 3.5–5.1)
Sodium: 135 mmol/L (ref 135–145)

## 2017-03-23 MED ORDER — AMIODARONE HCL 200 MG PO TABS: 200.0000 mg | ORAL_TABLET | Freq: Every day | ORAL | Status: DC

## 2017-03-23 MED ORDER — AMIODARONE HCL 200 MG PO TABS
200.0000 mg | ORAL_TABLET | Freq: Two times a day (BID) | ORAL | Status: DC
  Administered 2017-03-23 – 2017-03-24 (×3): 200 mg via ORAL
  Filled 2017-03-23 (×3): qty 1

## 2017-03-23 NOTE — Progress Notes (Signed)
Received a call from [facility], patient went to [place] [facility].  Cardiology PA L. Kilroy made aware.

## 2017-03-23 NOTE — Care Management Important Message (Signed)
Important Message  Patient Details  Name: Jimmy Myers MRN: [id number] Date of Birth: [birth year]   Medicare Important Message Given:  Yes    Anwitha Mapes 03/23/2017, 1:11 PM

## 2017-03-23 NOTE — Progress Notes (Addendum)
Progress Note  Patient Name: Jimmy Myers Date of Encounter: 03/23/2017  Primary Cardiologist: Allred  Subjective   Laying at 20 degrees mild SOB. Creatine continues to rise. Net positive yesterday.  Inpatient Medications    Scheduled Meds: . apixaban  2.5 mg Oral BID  . metoprolol succinate  75 mg Oral Daily  . pantoprazole  40 mg Oral Daily  . polyethylene glycol  17 g Oral QHS  . sodium chloride flush  3 mL Intravenous Q12H   Continuous Infusions: . sodium chloride    . amiodarone 30 mg/hr (03/23/17 0010)   PRN Meds: sodium chloride, acetaminophen, ALPRAZolam, ondansetron (ZOFRAN) IV, oxyCODONE, polyethylene glycol, sodium chloride flush, traZODone   Vital Signs    Vitals:   03/22/17 0650 03/22/17 1200 03/22/17 2102 03/23/17 0537  BP: 95/61 (!) 88/59 (!) 86/60 (!) 88/67  Pulse: 95 (!) 55 79 84  Resp: '18 18 18 18  '$ Temp: 97.8 F (36.6 C) 97.8 F (36.6 C) 97.8 F (36.6 C) 97.9 F (36.6 C)  TempSrc: Oral Oral Oral Oral  SpO2: 97% 94% 93% 93%  Weight:    210 lb 11.2 oz (95.6 kg)  Height:        Intake/Output Summary (Last 24 hours) at 03/23/17 0802 Last data filed at 03/23/17 0546  Gross per 24 hour  Intake          1543.03 ml  Output              626 ml  Net           917.03 ml   Filed Weights   03/21/17 0419 03/22/17 0600 03/23/17 0537  Weight: 207 lb 4.8 oz (94 kg) 207 lb 11.2 oz (94.2 kg) 210 lb 11.2 oz (95.6 kg)    Telemetry    Atrial flutter, variable rates - Personally Reviewed  ECG    N/A - Personally Reviewed  Physical Exam   GEN: Well nourished, well developed, in no acute distress  HEENT:  anicteric Neck: no JVD Cardiac:  Irregular irregular; flutter, rate in the 80 - 74B; 1/6 systolic murmur,  No rubs, or gallops, 2+ LE  edema Respiratory:  crackles bilaterally, normal work of breathing; no wheezing GI: soft, nontender, nondistended, + BS MS: no deformity or atrophy  Skin: warm and dry Neuro:  Strength and sensation are  intact Psych: euthymic mood, full affect   Labs    Chemistry Recent Labs Lab 03/18/17 1830  03/21/17 0331 03/22/17 0222 03/23/17 0416  NA 140  < > 139 138 135  K 4.3  < > 4.6 5.3* 5.3*  CL 103  < > 103 100* 95*  CO2 29  < > '23 25 27  '$ GLUCOSE 147*  < > 151* 125* 106*  BUN 44*  < > 77* 91* 107*  CREATININE 1.75*  < > 2.70* 3.29* 3.86*  CALCIUM 8.9  < > 8.9 8.7* 8.4*  PROT 5.8*  --   --   --   --   ALBUMIN 3.3*  --   --   --   --   AST 58*  --   --   --   --   ALT 68*  --   --   --   --   ALKPHOS 214*  --   --   --   --   BILITOT 1.5*  --   --   --   --   GFRNONAA 35*  < > 20* 16* 13*  GFRAA 40*  < > 24* 19* 15*  ANIONGAP 8  < > '13 13 13  '$ < > = values in this interval not displayed.   Hematology  Recent Labs Lab 03/18/17 1830  WBC 6.3  RBC 3.86*  HGB 11.8*  HCT 37.4*  MCV 96.9  MCH 30.6  MCHC 31.6  RDW 14.8  PLT 137*    Cardiac EnzymesNo results for input(s): TROPONINI in the last 168 hours. No results for input(s): TROPIPOC in the last 168 hours.   BNP  Recent Labs Lab 03/18/17 1830  BNP 1,928.8*     DDimer No results for input(s): DDIMER in the last 168 hours.    Radiology    No results found.  Cardiac Studies   ECHO 02/19/2017  - Left ventricle: The cavity size was severely dilated. Systolic function was severely reduced. The estimated ejection fraction was in the range of 20% to 25%. Severe diffuse hypokinesis with regional variations. The study was not technically sufficient to allow evaluation of LV diastolic dysfunction due to atrial fibrillation. - Aortic valve: Moderately to severely calcified annulus. Trileaflet; mildly thickened, moderately calcified leaflets. Valve area (VTI): 1.92 cm^2. Valve area (Vmax): 1.77 cm^2. Valve area (Vmean): 2.15 cm^2. - Aorta: Aortic root dimension: 50 mm (ED). - Aortic root: The aortic root was severely dilated. - Left atrium: The atrium was moderately to severely dilated. - Right  ventricle: The cavity size was mildly dilated. Wall thickness was normal. - Pulmonary arteries: PA peak pressure: 34 mm Hg (S).  Patient Profile     83 y.o. male with recurrent persistent atrial fibrillation and previous systolic HF (improved LVEF, now again severely depressed), recent pulmonary embolism, cholangiocarcinoma and Waldenstrom's macroglobulinemia, CKD, presents with severe RVR and HF decompensation.   Assessment & Plan    1. Afib/flutter: Has converted to atrial flutter with IV amiodarone and beta blockers. Flutter rate is better controlled. He is currently anticoagulated with Eliquis. Will switch to PO amiodarone today.  2. CHF: Ejection fraction 20% by recent echo. He has had no significant wgt loss or negative I/O and is now off Lasix secondary to worsening renal function.   3. Recent PE: June 2018- On Eliquis, dose adjusted for renal function.  4. Extrahepatic cholangiocarcinoma: Has had biliary obstruction and cholangitis status post biliary stent. Has met with palliative care and is a partial code.  5. Waldenstrom's macroglobulinemia: May be promoting hypercoagulability as well.  6. Acute on CKD:  SCR has gone from 1.3 to 3.86. Lasix has been stopped.   Plan: Will discuss with MD- options include trial of inotrope or comfort care. Pt seems to have an understanding of his condition. He asked "how long have I got".   Angelena Form, PA-C  03/23/2017, 8:02 AM    Patient seen and examined. Agree with assessment and plan. Renal function is worse. K 5.3.  Edema persists. Aflutter, rate controlled in the 80's;  To switch to oral amiodarone today.  Now on reduced eliquis; no bleeding.  EF 20 - 25% on last echo 02/19/17 reviewed by me with dilated Ao root with minimal AS by gradient but LN Fxn poor and LA dilated. Will obtain renal evaluation.  Troy Sine, MD, [facility] 03/23/2017 8:49 AM   Addendum: I spoke with Palliative Care- starting an inotrope would negate  Hospice services. Will not initiate inotropic Rx. I also spoke with Dr Justin Mend with renal service and they will see pt in am.  Kerin Ransom PA-C 03/23/2017 1:41 PM

## 2017-03-23 NOTE — Progress Notes (Signed)
DCP- home with hospice care; CM working on arrangements/ for home with [facility]; [place] [phone]

## 2017-03-24 ENCOUNTER — Inpatient Hospital Stay (HOSPITAL_COMMUNITY): Payer: PPO

## 2017-03-24 DIAGNOSIS — I482 Chronic atrial fibrillation, unspecified: Secondary | ICD-10-CM

## 2017-03-24 DIAGNOSIS — N189 Chronic kidney disease, unspecified: Secondary | ICD-10-CM

## 2017-03-24 DIAGNOSIS — Z515 Encounter for palliative care: Secondary | ICD-10-CM

## 2017-03-24 DIAGNOSIS — I480 Paroxysmal atrial fibrillation: Secondary | ICD-10-CM | POA: Insufficient documentation

## 2017-03-24 DIAGNOSIS — L8990 Pressure ulcer of unspecified site, unspecified stage: Secondary | ICD-10-CM | POA: Insufficient documentation

## 2017-03-24 DIAGNOSIS — I5022 Chronic systolic (congestive) heart failure: Secondary | ICD-10-CM

## 2017-03-24 DIAGNOSIS — N184 Chronic kidney disease, stage 4 (severe): Secondary | ICD-10-CM

## 2017-03-24 DIAGNOSIS — N170 Acute kidney failure with tubular necrosis: Secondary | ICD-10-CM

## 2017-03-24 DIAGNOSIS — N185 Chronic kidney disease, stage 5: Secondary | ICD-10-CM

## 2017-03-24 DIAGNOSIS — N179 Acute kidney failure, unspecified: Secondary | ICD-10-CM | POA: Diagnosis present

## 2017-03-24 MED ORDER — AMIODARONE HCL 200 MG PO TABS: 200.0000 mg | ORAL_TABLET | Freq: Every day | ORAL | 3 refills | Status: AC

## 2017-03-24 MED ORDER — APIXABAN 2.5 MG PO TABS: 2.5000 mg | ORAL_TABLET | Freq: Two times a day (BID) | ORAL | 3 refills | Status: AC

## 2017-03-24 NOTE — Consult Note (Signed)
Referring Provider: No ref. provider found Primary Care Physician:  [name], MD Primary Nephrologist:    Reason for Consultation: Stage 4 Chronic renal failure , Anemia and anasarca   HPI: systolic heart failure with an EF previously down to 20 to 25%. atrial fib with prior cardioversion, bradycardia, enlarged ascending aorta (previously followed by Dr. [name] and no longer measured), thyroid nodules (with priorbenign biopsy), chronic thrombocytopenia and HTN. Had an event monitor in 2014 - average HR of 61. 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 He was referred for oncology. Was sent to SNF from the hospital. Noted to be a DNR. His coumadin was held.  [place] - found to be back in AF with RVR - also with PE noted. He was placedon IV heparin and transitioned to Eliquis He presented to the hospital for the management of fluid overload and diffuse anasarca  He continues to make urine about 500-600 cc per day  Although his weight is up about 5 lbs since 7/19  His baseline renal function appears fairly  normal about 1 month ago and patient had contrast enhanced studies in July 10th  There appears no nephrotoxin use although lasix has been held  -- doses between 80-120mg  daily  Creatinine on admission was 1.8 -- 2.53 --  2.7  -- 3.50  Lowish systolic blood pressures  80-110 mmHg      Past Medical History:  Diagnosis Date  . A-fib [facility]) May 2013   s/p TEE/cardioversion June 2013  . Anxiety   . Aortic root enlargement (HCC)    at least 5cm by CT  . Arthritis   . Atrial thrombus   . Bradycardia   . Bronchitis   . Cancer ([place]) 2016   lymphoma  . Chronic anticoagulation    on coumadin - checked in [place]  . Chronic systolic heart failure ([place]) 12/28/2011  . Congestive heart failure (CHF) ([place]) 2013  . Diverticulosis   . Dysrhythmia    AFib   . GERD (gastroesophageal reflux disease)   . Hemorrhoids   . HTN (hypertension)   . PE (pulmonary thromboembolism) ([place]) 01/2017  . Pneumonia May 2013  . Pneumonia 2013  . 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 Thrombocytopenia ([place])     Past Surgical History:  Procedure Laterality Date  . CARDIOVERSION  12/30/2011   Procedure: CARDIOVERSION;  Surgeon: Lelon Perla, MD;  Location: [facility] ENDOSCOPY;  Service: Cardiovascular;  Laterality: N/A;  . CARDIOVERSION  02/16/2012   Procedure: CARDIOVERSION;  Surgeon: Lelon Perla, MD;  Location: [facility] ENDOSCOPY;  Service: Cardiovascular;  Laterality: N/A;  . CATARACT EXTRACTION W/PHACO Left 07/28/2016   Procedure: CATARACT EXTRACTION PHACO AND INTRAOCULAR LENS PLACEMENT LEFT EYE:  CDE: 13.00;  Surgeon: Tonny Branch, MD;  Location: [facility] ORS;  Service: Ophthalmology;  Laterality: Left;  . ENDOSCOPIC RETROGRADE CHOLANGIOPANCREATOGRAPHY (ERCP) WITH PROPOFOL N/A 10/24/2016   Procedure: ENDOSCOPIC RETROGRADE CHOLANGIOPANCREATOGRAPHY (ERCP) WITH PROPOFOL;  Surgeon: Doran Stabler, MD;  Location: [place] ENDOSCOPY;  Service: Endoscopy;  Laterality: N/A;  . ESOPHAGOGASTRODUODENOSCOPY  12/30/2011   Procedure: ESOPHAGOGASTRODUODENOSCOPY (EGD);  Surgeon: Lelon Perla, MD;  Location: [place];  Service: Cardiovascular;  Laterality: N/A;  . ESOPHAGOGASTRODUODENOSCOPY  12/30/2011   Procedure:  ESOPHAGOGASTRODUODENOSCOPY (EGD);  Surgeon: Lafayette Dragon, MD;  Location: [facility] ENDOSCOPY;  Service: Endoscopy;  Laterality: N/A;  . IR BILIARY STENT(S) EXISTING ACCESS INC DILATION CATH EXCHANGE  12/16/2016  . IR GENERIC HISTORICAL  10/25/2016   IR INT EXT BILIARY DRAIN WITH CHOLANGIOGRAM 10/25/2016 Greggory Keen, MD [facility]-INTERV RAD  . IR GENERIC HISTORICAL  10/28/2016   IR EXCHANGE BILIARY DRAIN 10/28/2016 Greggory Keen, MD [facility]INTER[facility]  . IR  GENERIC HISTORICAL  10/28/2016   IR ENDOLUMINAL BX OF BILIARY TREE 10/28/2016 Greggory Keen, MD [facility]INTER[facility]  . IR GENERIC HISTORICAL  11/21/2016   IR EXCHANGE BILIARY DRAIN 11/21/2016 Corrie Mckusick, DO [facility]-INTERV RAD  . IR RADIOLOGIST EVAL & MGMT  12/03/2016  . IR RADIOLOGIST EVAL & MGMT  11/11/2016  . IR REMOVAL BILIARY DRAIN  12/30/2016  . KNEE ARTHROSCOPY  rt knee  . PROSTATE SURGERY    . TEE WITHOUT CARDIOVERSION  12/30/2011   Procedure: TRANSESOPHAGEAL ECHOCARDIOGRAM (TEE);  Surgeon: Lelon Perla, MD;  Location: [facility] ENDOSCOPY;  Service: Cardiovascular;  Laterality: N/A;  . TEE WITHOUT CARDIOVERSION  02/16/2012   Procedure: TRANSESOPHAGEAL ECHOCARDIOGRAM (TEE);  Surgeon: Lelon Perla, MD;  Location: [facility] ENDOSCOPY;  Service: Cardiovascular;  Laterality: N/A;    Prior to Admission medications   Medication Sig Start Date End Date Taking? Authorizing Provider  acetaminophen (TYLENOL) 325 MG tablet Take 325 mg by mouth every 6 (six) hours as needed for mild pain.    Yes [provider]  albuterol (PROVENTIL HFA;VENTOLIN HFA) 108 (90 BASE) MCG/ACT inhaler Inhale 2 puffs into the lungs every 6 (six) hours as needed for wheezing or shortness of breath.   Yes [provider]  apixaban (ELIQUIS) 5 MG TABS tablet Take 1 tablet (5 mg total) by mouth 2 (two) times daily. 03/03/17  Yes Burtis Junes, NP  clorazepate (TRANXENE) 3.75 MG tablet Take 1.875-3.75 mg by mouth daily as needed for anxiety.    Yes [provider]  fluticasone (FLONASE) 50 MCG/ACT nasal spray Place 1 spray into both nostrils daily as needed for allergies or rhinitis.   Yes [provider]  folic acid (FOLVITE) 782 MCG tablet Take 800 mcg by mouth daily.   Yes [provider]  furosemide (LASIX) 40 MG tablet Take 3 tablets today and tomorrow - then 2 tablets by by mouth daily Patient taking differently: Take 80 mg by mouth daily.  03/03/17  Yes Burtis Junes, NP  metoprolol succinate  (TOPROL-XL) 25 MG 24 hr tablet Take 3 tablets (75 mg total) by mouth daily. 03/03/17  Yes Burtis Junes, NP  nitroGLYCERIN (NITROSTAT) 0.4 MG SL tablet Place 1 tablet (0.4 mg total) under the tongue every 5 (five) minutes x 3 doses as needed for chest pain. 05/15/14 10/06/17 Yes Burtis Junes, NP  omeprazole (PRILOSEC) 20 MG capsule Take 20 mg by mouth daily before breakfast.    Yes [provider]  polyethylene glycol (MIRALAX / GLYCOLAX) packet Take 17 g by mouth at bedtime.    Yes [provider]  potassium chloride (K-DUR) 10 MEQ tablet Take 1 tablet (10 mEq total) by mouth daily. 12/24/16  Yes Burtis Junes, NP    Current Facility-Administered Medications  Medication Dose Route Frequency Provider Last Rate Last Dose  . 0.9 %  sodium chloride infusion  250 mL Intravenous PRN Truitt Merle C, NP      . acetaminophen (TYLENOL) tablet 650 mg  650 mg Oral Q4H PRN Burtis Junes, NP      .  ALPRAZolam (XANAX) tablet 0.25 mg  0.25 mg Oral Q8H PRN Sueanne Margarita, MD   0.25 mg at 03/23/17 0008  . amiodarone (PACERONE) tablet 200 mg  200 mg Oral BID Erlene Quan, PA-C   200 mg at 03/23/17 2149  . [START ON 03/26/2017] amiodarone (PACERONE) tablet 200 mg  200 mg Oral Daily Kilroy, Luke K, PA-C      . apixaban (ELIQUIS) tablet 2.5 mg  2.5 mg Oral BID Croitoru, Mihai, MD   2.5 mg at 03/23/17 2149  . metoprolol succinate (TOPROL-XL) 24 hr tablet 75 mg  75 mg Oral Daily Truitt Merle C, NP   75 mg at 03/22/17 0948  . ondansetron (ZOFRAN) injection 4 mg  4 mg Intravenous Q6H PRN Truitt Merle C, NP   4 mg at 03/21/17 1027  . oxyCODONE (ROXICODONE INTENSOL) 20 MG/ML concentrated solution 5 mg  5 mg Oral Q1H PRN Earlie Counts, NP      . pantoprazole (PROTONIX) EC tablet 40 mg  40 mg Oral Daily Truitt Merle C, NP   40 mg at 03/23/17 0900  . polyethylene glycol (MIRALAX / GLYCOLAX) packet 17 g  17 g Oral Daily PRN Truitt Merle C, NP      . polyethylene glycol (MIRALAX / GLYCOLAX)  packet 17 g  17 g Oral QHS Nahser, Wonda Cheng, MD   17 g at 03/23/17 2148  . sodium chloride flush (NS) 0.9 % injection 3 mL  3 mL Intravenous Q12H Truitt Merle C, NP   3 mL at 03/23/17 2150  . sodium chloride flush (NS) 0.9 % injection 3 mL  3 mL Intravenous PRN Burtis Junes, NP      . traZODone (DESYREL) tablet 25 mg  25 mg Oral QHS PRN Earlie Counts, NP   25 mg at 03/23/17 2149    Allergies as of 03/18/2017 - Review Complete 03/18/2017  Allergen Reaction Noted  . Codeine Other (See Comments) 12/28/2011  . Simvastatin Other (See Comments) 12/28/2011  . Vicodin [hydrocodone-acetaminophen] Other (See Comments) 12/28/2011    Family History  Problem Relation Age of Onset  . Heart disease Mother   . Heart disease Father   . Stomach cancer Brother   . Heart disease Sister   . Heart disease Brother   . Hypertension Sister     Social History   Social History  . Marital status: Married    Spouse name: N/A  . Number of children: 2  . Years of education: N/A   Occupational History  . retired Retired   Social History Main Topics  . Smoking status: Never Smoker  . Smokeless tobacco: Never Used  . Alcohol use No  . Drug use: No  . Sexual activity: Not Currently    Birth control/ protection: None   Other Topics Concern  . Not on file   Social History Narrative  . No narrative on file    Review of Systems: Gen: Denies any fever, chills, sweats, + anorexia, + fatigue, + weakness,+ malaise,no weight loss, and sleep disorder HEENT: No visual complaints, No history of Retinopathy. Normal external appearance No Epistaxis or Sore throat. No sinusitis.   CV: Denies chest pain, angina, + palpitations, syncope, + orthopnea, PND, + peripheral edema, and no claudication. Resp: + dyspnea at rest, + dyspnea with exercise, no cough, sputum, wheezing, coughing up blood, and pleurisy. GI: Denies vomiting blood, jaundice, and fecal incontinence.   Denies dysphagia or odynophagia. GU :  Denies urinary burning, blood  in urine, urinary frequency, urinary hesitancy, nocturnal urination, and urinary incontinence.  No renal calculi. MS: Denies joint pain, limitation of movement, and swelling, stiffness, low back pain, extremity pain. Denies muscle weakness, cramps, atrophy.  No use of non steroidal antiinflammatory drugs. Derm: Denies rash, itching, dry skin, hives, moles, warts, or unhealing ulcers.  Psych: Denies depression, anxiety, memory loss, suicidal ideation, hallucinations, paranoia, and confusion. Heme: Denies bruising, bleeding, and enlarged lymph nodes. Neuro: No headache.  No diplopia. No dysarthria.  No dysphasia.  No history of CVA.  No Seizures. No paresthesias.   Endocrine No DM.  No Thyroid disease.  No Adrenal disease.  Physical Exam: Vital signs in last 24 hours: Temp:  [97.4 F (36.3 C)-98 F (36.7 C)] 97.9 F (36.6 C) (07/24 0434) Pulse Rate:  [83-108] 90 (07/24 0434) Resp:  [18] 18 (07/24 0434) BP: (88-94)/(64-71) 93/69 (07/24 0434) SpO2:  [92 %-96 %] 92 % (07/24 0434) Last BM Date: 03/22/17 General:   Appears ill and cachectic  cooperative in NAD Head:  Normocephalic and atraumatic. Eyes:  Sclera clear, no icterus.   Conjunctiva pink. Ears:  Normal auditory acuity. Nose:  No deformity, discharge,  or lesions. Mouth:  No deformity or lesions, dentition normal. Neck:  Supple; no masses or thyromegaly. JVP does not appear elevated Lungs:  Clear throughout to auscultation although dimindished at bases  No wheezes, fine basal crackles, or rhonchi. No acute distress. Heart:  irregular rate and rhythm; no murmurs, clicks, rubs,  or gallops. Abdomen:  Soft, nontender and mild distended. No masses, hepatosplenomegaly or hernias noted. Normal bowel sounds, without guarding, and without rebound.   Msk:  Symmetrical without gross deformities. Normal posture. Pulses:  No carotid, renal, femoral bruits. DP and PT symmetrical and equal Extremities:  Without  clubbing   4 + edema. Neurologic:  Alert and  oriented x4;  grossly normal neurologically. Skin:  Intact without significant lesions or rashes. Cervical Nodes:  No significant cervical adenopathy. Psych:  Alert and cooperative. Normal mood and affect.  Intake/Output from previous day: 07/23 0701 - 07/24 0700 In: 900 [P.O.:900] Out: 700 [Urine:700] Intake/Output this shift: No intake/output data recorded.  Lab Results: No results for input(s): WBC, HGB, HCT, PLT in the last 72 hours. BMET  Recent Labs  03/22/17 0222 03/23/17 0416  NA 138 135  K 5.3* 5.3*  CL 100* 95*  CO2 25 27  GLUCOSE 125* 106*  BUN 91* 107*  CREATININE 3.29* 3.86*  CALCIUM 8.7* 8.4*   LFT No results for input(s): PROT, ALBUMIN, AST, ALT, ALKPHOS, BILITOT, BILIDIR, IBILI in the last 72 hours. PT/INR No results for input(s): LABPROT, INR in the last 72 hours. Hepatitis Panel No results for input(s): HEPBSAG, HCVAB, HEPAIGM, HEPBIGM in the last 72 hours.  Studies/Results: No results found.  Assessment/Plan:  Acute kidney injury that appears to have worsened during hospitalization. He has advanced heart failure and has continued to gain weight during hospitalization. Will check renal ultrasound and will check urinalysis. My impression is that his low blood pressure and decreased renal perfusion is leading to worsening renal function. He would not tolerate dialysis and am not sure appropriate for pressors.  Hyperkalemia  Would continue a low K diet and consider kayexalate for elimination of K  Anemia  Hb 11.8 stable  Volume appears have prominent anasarca that is progressive and unlikely to be improved with adding additional diuretics as hemodynamics appear very altered at this time  Unfortunately there appears little to do  I  will rule out obstruction but feel that palliative care is more appropriate   LOS: 5 Arnulfo Batson W @TODAY @8 :16 AM

## 2017-03-24 NOTE — Progress Notes (Signed)
Examined and spoke with Mr. Jimmy Myers. His only complaint is that he is not urinating.  Nephrology consulted this morning and will check a U/A and an ultrasound to ensure there is no reversible cause for his decreased urine output.  He is sleeping well.  Denies pain.  Eating ok.  Plan is for home with hospice as soon as possible.  Please call with any questions or concerns.   PMT will sign off.  Florentina Jenny, PA-C Palliative Medicine Pager: [phone]  Time 15 min.

## 2017-03-24 NOTE — Progress Notes (Signed)
Patient to be discharged home today with home hospice; [facility] called and updated. Mindi Slicker [facility] [phone]

## 2017-03-24 NOTE — Progress Notes (Signed)
Attempted to stand patient up for weight this am x3. Patient continuously fell back onto bed even with help from NT and RN. Can not chart standing weight for the morning.

## 2017-03-24 NOTE — Progress Notes (Signed)
Discharge to home, wife at bedside.D/c instructions and follow up appointments discussed with patient and with wife.PIV removed by NT.Patient alert and oriented,no complaints of any pain or discomfort upon discharge. [facility] transporting patient.

## 2017-03-24 NOTE — Progress Notes (Signed)
Discharge Summary faxed to [place] as requested. Fax # [phone]

## 2017-03-24 NOTE — Progress Notes (Signed)
Progress Note  Patient Name: Jimmy Myers Date of Encounter: 03/24/2017  Primary Cardiologist: J. Allred, MD   Subjective   Breathing slightly better though speech remains labored.  +200 ml yesterday.  Wishes to go home with hospice care.  Inpatient Medications    Scheduled Meds: . amiodarone  200 mg Oral BID  . [START ON 03/26/2017] amiodarone  200 mg Oral Daily  . apixaban  2.5 mg Oral BID  . metoprolol succinate  75 mg Oral Daily  . pantoprazole  40 mg Oral Daily  . polyethylene glycol  17 g Oral QHS  . sodium chloride flush  3 mL Intravenous Q12H   Continuous Infusions: . sodium chloride     PRN Meds: sodium chloride, acetaminophen, ALPRAZolam, ondansetron (ZOFRAN) IV, oxyCODONE, polyethylene glycol, sodium chloride flush, traZODone   Vital Signs    Vitals:   03/23/17 1224 03/23/17 2133 03/24/17 0434 03/24/17 0947  BP: 92/64 93/68 93/69  96/70  Pulse: 83 93 90 (!) 110  Resp: 18 18 18    Temp: 98 F (36.7 C) (!) 97.4 F (36.3 C) 97.9 F (36.6 C)   TempSrc: Oral Oral Oral   SpO2: 96% 92% 92%   Weight:      Height:        Intake/Output Summary (Last 24 hours) at 03/24/17 1119 Last data filed at 03/24/17 0900  Gross per 24 hour  Intake              880 ml  Output              700 ml  Net              180 ml   Filed Weights   03/21/17 0419 03/22/17 0600 03/23/17 0537  Weight: 207 lb 4.8 oz (94 kg) 207 lb 11.2 oz (94.2 kg) 210 lb 11.2 oz (95.6 kg)    Physical Exam   GEN: Well nourished, well developed, in no acute distress.  HEENT: Grossly normal.  Neck: Supple, JVD to jaw, no carotid bruits, or masses. Cardiac: Irreg, tachy, no murmurs, rubs, or gallops. No clubbing, cyanosis, 2-3+ bilat LE edema.  Radials/DP/PT 2+ and equal bilaterally.  Respiratory:  Respirations regular and unlabored, bibasilar crackles. GI: semi-firm, nontender, BS + x 4. MS: no deformity or atrophy. Skin: warm and dry, no rash. Neuro:  Strength and sensation are intact. Psych:  AAOx3.  Normal affect.  Labs    Chemistry Recent Labs Lab 03/18/17 1830  03/21/17 0331 03/22/17 0222 03/23/17 0416  NA 140  < > 139 138 135  K 4.3  < > 4.6 5.3* 5.3*  CL 103  < > 103 100* 95*  CO2 29  < > 23 25 27   GLUCOSE 147*  < > 151* 125* 106*  BUN 44*  < > 77* 91* 107*  CREATININE 1.75*  < > 2.70* 3.29* 3.86*  CALCIUM 8.9  < > 8.9 8.7* 8.4*  PROT 5.8*  --   --   --   --   ALBUMIN 3.3*  --   --   --   --   AST 58*  --   --   --   --   ALT 68*  --   --   --   --   ALKPHOS 214*  --   --   --   --   BILITOT 1.5*  --   --   --   --   GFRNONAA 35*  < >  20* 16* 13*  GFRAA 40*  < > 24* 19* 15*  ANIONGAP 8  < > 13 13 13   < > = values in this interval not displayed.   Hematology Recent Labs Lab 03/18/17 1830  WBC 6.3  RBC 3.86*  HGB 11.8*  HCT 37.4*  MCV 96.9  MCH 30.6  MCHC 31.6  RDW 14.8  PLT 137*   BNP Recent Labs Lab 03/18/17 1830  BNP 1,928.8*      Radiology    US Renal  Result Date: 03/24/2017 CLINICAL DATA:  Acute renal failure. EXAM: RENAL / URINARY TRACT ULTRASOUND COMPLETE COMPARISON:  CT scan dated 03/10/2017 FINDINGS: Right Kidney: Length: 12.6 cm. Echogenicity within normal limits. 5.3 cm cyst on the upper pole the right kidney, unchanged. No solid masses. No hydronephrosis. Left Kidney: Length: 1.5 cm. Echogenicity within normal limits. 1.8 cm cyst in the mid left kidney. No solid masses. No hydronephrosis. Bladder: Appears normal for degree of bladder distention. Note is made of ascites and bilateral pleural effusions. The ascites is new since the prior CT scan. IMPRESSION: 1. No acute abnormality of the kidneys.  No hydronephrosis. 2. New ascites since 03/10/2017. 3. Persistent bilateral pleural effusions. Electronically Signed   By: Lorriane Shire M.D.   On: 03/24/2017 11:13    Telemetry    Aflutter - variable conduction, rate ~ 100 - Personally Reviewed  Cardiac Studies   ECHO 02/19/2017   - Left ventricle: The cavity size was severely  dilated. Systolic   function was severely reduced. The estimated ejection fraction   was in the range of 20% to 25%. Severe diffuse hypokinesis with   regional variations. The study was not technically sufficient to   allow evaluation of LV diastolic dysfunction due to atrial   fibrillation. - Aortic valve: Moderately to severely calcified annulus.   Trileaflet; mildly thickened, moderately calcified leaflets.   Valve area (VTI): 1.92 cm^2. Valve area (Vmax): 1.77 cm^2. Valve   area (Vmean): 2.15 cm^2. - Aorta: Aortic root dimension: 50 mm (ED). - Aortic root: The aortic root was severely dilated. - Left atrium: The atrium was moderately to severely dilated. - Right ventricle: The cavity size was mildly dilated. Wall   thickness was normal. - Pulmonary arteries: PA peak pressure: 34 mm Hg (S).   Patient Profile     83 y.o. male with recurrent persistent atrial fibrillation and previous systolic HF (improved LVEF, now again severely depressed), recent pulmonary embolism, cholangiocarcinoma and Waldenstrom's macroglobulinemia, CKD, presents with severe RVR and HF decompensation.   Assessment & Plan    1.  Acute on chronic combined systolic and diastolic CHF:  EF 64-33% by echo in June.  Admitted with worsening volume overload and dyspnea with elevated heart rates (afib).  Still with dyspnea, though he says it's somewhat better this am.  I/O + 200 overnight, minus 1L since admission.  Wt has climbed 5 lbs since admission.  Creat has also been rising.  Nephrology saw this am.  Renal u/s w/o obstructive process/hydronephrosis.  Pt and family prefer conservative therapy and their greatest wish today is to go home with hospice.  He has been seen by palliative care while here, and hospice @ home has been arranged.  He remains volume overloaded and is dyspneic with speaking.  He and his family understand his illness and are all in agreement that home with hospice is their preference.  Cont  blocker  therapy for HR control and arrhythmia prevention.  He is not currently  on lasix in the setting of poor response and worsening renal fxn.  No acei/arb/arni/MRA 2/2 worsening renal fxn and relative hypotension.  2.  Acute on chronic renal failure:  Appreciate nephrology input.  Renal u/s w/o obstruction/hydronephrosis.    3.  Permanent Afib/flutter:  Remains in atrial flutter with HR hovering around 100.  He says that he is more comfortable than he was earlier on admission.  He is now on oral amio.  It was previously felt that amio was not ideal 2/2 cholangiocarcinoma and elevated LFTs, though this appears to be the only thing that is controlling his HR.  LFTs mildly elevated on admission.  Difficult situation.  Cont eliquis.  4.  Recent PE:  Cont eliquis.   5.  Cholangiocarcinoma:  He is not interested in further therapy @ this point.  6. Hyperkalemia:  No BMET this am in setting of plan for hospice mgmt.  Signed, Murray Hodgkins, NP  03/24/2017, 11:19 AM     Patient seen and examined. Agree with assessment and plan. Renal US without acute abnormality of kidndeys. No hydronephrosis. New ascites; Persistent bilateral pleural effusions. Cr increased to 3.86. Not felt to be a dialysis candidate. For home hospice.   Troy Sine, MD, [facility] 03/24/2017 1:37 PM

## 2017-03-24 NOTE — Discharge Summary (Signed)
Discharge Summary    Patient ID: Jimmy Myers,  MRN: [id number], DOB/AGE: 81/15/1933 81 y.o.  Admit date: 03/18/2017 Discharge date: 03/24/2017  Primary Care Provider: [name] Primary Cardiologist: Lenna Sciara. Allred, MD   Discharge Diagnoses    Principal Problem:   Acute on chronic combined systolic and diastolic CHF (congestive heart failure) (HCC) Active Problems:   Acute renal failure (ARF) (HCC)   Cholangiocarcinoma (HCC)   Palliative care by specialist   Acute on chronic renal failure (HCC)   Chronic atrial fibrillation with atrial flutter (HCC)   HTN (hypertension), benign   Pulmonary embolism ([place])   Advance care planning   Goals of care, counseling/discussion   Pressure injury of skin   Allergies Allergies  Allergen Reactions  . Codeine Other (See Comments)    nervousness  . Simvastatin Other (See Comments)    Muscle pain/hurting  . Vicodin [Hydrocodone-Acetaminophen] Other (See Comments)    nervousness     History of Present Illness     83 y/o ? with the above complex PMH including chronic combined syst/diast CHF, HTN, thyroid nodules, dilated ascending aorta, previously paroxysmal and now permanent afib, thrombocytopenia, cholangiocarcinoma (dx 10/2016  radiation Rx), and CKD III-IV.  In June of this year he was admitted with dyspnea and found to be in Afib with RVR.  PE was also diagnosed.  He was placed on Eliquis and  blocker therapy.  Reasonable rate control was achieved.  He was not felt to be a candidate for antiarrhythmic therapy due to h/o cholangiocarcinoma and LFT elevation and systolic dysfunction.  He was seen back in clinic on 7/18 and was noted to be back in afib with volume overload.  Decision was made to admit him for further evaluation.  Hospital Course     Consultants: Palliative Care;  Nephrology   Following admission, pt was placed on IV lasix with an initial fair response.  He remained in rapid afib and as a result, he was placed on  amiodarone therapy.  With this, HR did improve.  Unfortunately, with IV lasix, creatinine increased, rising from 1.75 on admission to a peak of 3.86 by 7/23.  In that setting, lasix was held and nephrology was consulted.  Renal u/s was negative for hydronephrosis/obstruction, and it was felt that worsening renal failure was likely cardiorenal in nature.  In the setting of ongoing volume overload, worsening renal failure, and known cholangiocarcinoma, palliative care was consulted.  Goals of care were established and Jimmy Myers was made a DNR with a plan to go home with hospice.  As a result, Jimmy Myers will be discharged home today.  We will continue amiodarone for rate control with oral anticoagulation for the the time being. _____________  Discharge Vitals Blood pressure 91/64, pulse (!) 57, temperature 98.3 F (36.8 C), temperature source Oral, resp. rate 18, height 5\' 10"  (1.778 m), weight 210 lb 11.2 oz (95.6 kg), SpO2 90 %.  Filed Weights   03/21/17 0419 03/22/17 0600 03/23/17 0537  Weight: 207 lb 4.8 oz (94 kg) 207 lb 11.2 oz (94.2 kg) 210 lb 11.2 oz (95.6 kg)    Labs & Radiologic Studies    CBC Lab Results  Component Value Date   WBC 6.3 03/18/2017   HGB 11.8 (L) 03/18/2017   HCT 37.4 (L) 03/18/2017   MCV 96.9 03/18/2017   PLT 137 (L) 22/97/9892    Basic Metabolic Panel  Recent Labs  03/22/17 0222 03/23/17 0416  NA 138 135  K 5.3* 5.3*  CL 100* 95*  CO2 25 27  GLUCOSE 125* 106*  BUN 91* 107*  CREATININE 3.29* 3.86*  CALCIUM 8.7* 8.4*   _____________  Dg Chest 2 View  Result Date: 02/23/2017 CLINICAL DATA:  83 year old male with shortness of Breath and weakness. EXAM: CHEST  2 VIEW COMPARISON:  02/21/2017 and earlier. FINDINGS: Small bilateral pleural effusions persist and may be mildly increased since 02/19/2017. Decreased pulmonary vascularity since that time. No overt edema. No pneumothorax. Lung base atelectasis. No consolidation. Stable cardiomegaly and mediastinal  contours. Calcified aortic atherosclerosis. Visualized tracheal air column is within normal limits. No acute osseous abnormality identified. Negative visible bowel gas pattern. IMPRESSION: 1. Resolved pulmonary edema but continued and perhaps slightly increased bilateral pleural effusions since 02/19/2017. 2. Cardiomegaly.  Calcified aortic atherosclerosis. Electronically Signed   By: Genevie Ann M.D.   On: 02/23/2017 08:27   US Renal  Result Date: 03/24/2017 CLINICAL DATA:  Acute renal failure. EXAM: RENAL / URINARY TRACT ULTRASOUND COMPLETE COMPARISON:  CT scan dated 03/10/2017 FINDINGS: Right Kidney: Length: 12.6 cm. Echogenicity within normal limits. 5.3 cm cyst on the upper pole the right kidney, unchanged. No solid masses. No hydronephrosis. Left Kidney: Length: 1.5 cm. Echogenicity within normal limits. 1.8 cm cyst in the mid left kidney. No solid masses. No hydronephrosis. Bladder: Appears normal for degree of bladder distention. Note is made of ascites and bilateral pleural effusions. The ascites is new since the prior CT scan. IMPRESSION: 1. No acute abnormality of the kidneys.  No hydronephrosis. 2. New ascites since 03/10/2017. 3. Persistent bilateral pleural effusions. Electronically Signed   By: Lorriane Shire M.D.   On: 03/24/2017 11:13   Disposition   Pt is being discharged home today in good condition.  [facility] up.   Contact information: [address] [place] [address] [phone]           Discharge Medications   Current Discharge Medication List    START taking these medications   Details  amiodarone (PACERONE) 200 MG tablet Take 1 tablet (200 mg total) by mouth daily. Qty: 30 tablet, Refills: 3      CONTINUE these medications which have CHANGED   Details  apixaban (ELIQUIS) 2.5 MG TABS tablet Take 1 tablet (2.5 mg total) by mouth 2 (two) times daily. Qty: 60 tablet, Refills:  3      CONTINUE these medications which have NOT CHANGED   Details  acetaminophen (TYLENOL) 325 MG tablet Take 325 mg by mouth every 6 (six) hours as needed for mild pain.     albuterol (PROVENTIL HFA;VENTOLIN HFA) 108 (90 BASE) MCG/ACT inhaler Inhale 2 puffs into the lungs every 6 (six) hours as needed for wheezing or shortness of breath.    clorazepate (TRANXENE) 3.75 MG tablet Take 1.875-3.75 mg by mouth daily as needed for anxiety.     fluticasone (FLONASE) 50 MCG/ACT nasal spray Place 1 spray into both nostrils daily as needed for allergies or rhinitis.    folic acid (FOLVITE) 300 MCG tablet Take 800 mcg by mouth daily.    metoprolol succinate (TOPROL-XL) 25 MG 24 hr tablet Take 3 tablets (75 mg total) by mouth daily. Qty: 90 tablet, Refills: 6    nitroGLYCERIN (NITROSTAT) 0.4 MG SL tablet Place 1 tablet (0.4 mg total) under the tongue every 5 (five) minutes x 3 doses as needed for chest pain. Qty: 25 tablet, Refills:  6    omeprazole (PRILOSEC) 20 MG capsule Take 20 mg by mouth daily before breakfast.     polyethylene glycol (MIRALAX / GLYCOLAX) packet Take 17 g by mouth at bedtime.       STOP taking these medications     furosemide (LASIX) 40 MG tablet      potassium chloride (K-DUR) 10 MEQ tablet           Outstanding Labs/Studies   None  Duration of Discharge Encounter   Greater than 30 minutes including physician time.  Signed, Murray Hodgkins NP 03/24/2017, 2:44 PM

## 2017-03-27 ENCOUNTER — Telehealth: Payer: Self-pay | Admitting: Family Medicine

## 2017-04-01 NOTE — Telephone Encounter (Signed)
fyi

## 2017-04-01 DEATH — deceased

## 2017-04-08 ENCOUNTER — Ambulatory Visit: Payer: PPO | Admitting: Family Medicine

## 2017-04-17 DIAGNOSIS — Z515 Encounter for palliative care: Secondary | ICD-10-CM

## 2017-05-01 ENCOUNTER — Ambulatory Visit: Payer: PPO | Admitting: Family Medicine

## 2017-05-20 ENCOUNTER — Other Ambulatory Visit: Payer: PPO

## 2017-05-20 ENCOUNTER — Ambulatory Visit: Payer: PPO | Admitting: Hematology
